# Patient Record
Sex: Male | Born: 1940
Health system: Southern US, Community
[De-identification: ages and names within clinical notes are randomized; demographics above are authoritative.]

## PROBLEM LIST (undated history)

## (undated) DIAGNOSIS — I2510 Atherosclerotic heart disease of native coronary artery without angina pectoris: Secondary | ICD-10-CM

## (undated) DIAGNOSIS — R011 Cardiac murmur, unspecified: Secondary | ICD-10-CM

## (undated) DIAGNOSIS — F32A Depression, unspecified: Secondary | ICD-10-CM

## (undated) DIAGNOSIS — F329 Major depressive disorder, single episode, unspecified: Secondary | ICD-10-CM

## (undated) DIAGNOSIS — I219 Acute myocardial infarction, unspecified: Secondary | ICD-10-CM

## (undated) DIAGNOSIS — E785 Hyperlipidemia, unspecified: Secondary | ICD-10-CM

## (undated) DIAGNOSIS — Z5189 Encounter for other specified aftercare: Secondary | ICD-10-CM

## (undated) DIAGNOSIS — H269 Unspecified cataract: Secondary | ICD-10-CM

## (undated) DIAGNOSIS — C4490 Unspecified malignant neoplasm of skin, unspecified: Secondary | ICD-10-CM

## (undated) HISTORY — DX: Cardiac murmur, unspecified: R01.1

## (undated) HISTORY — PX: OTHER SURGICAL HISTORY: SHX169

## (undated) HISTORY — DX: Encounter for other specified aftercare: Z51.89

## (undated) HISTORY — DX: Unspecified cataract: H26.9

## (undated) HISTORY — PX: HERNIA REPAIR: SHX51

## (undated) HISTORY — DX: Major depressive disorder, single episode, unspecified: F32.9

## (undated) HISTORY — DX: Hyperlipidemia, unspecified: E78.5

## (undated) HISTORY — DX: Atherosclerotic heart disease of native coronary artery without angina pectoris: I25.10

## (undated) HISTORY — DX: Depression, unspecified: F32.A

## (undated) HISTORY — PX: APPENDECTOMY: SHX54

## (undated) HISTORY — DX: Unspecified malignant neoplasm of skin, unspecified: C44.90

## (undated) HISTORY — PX: COLON SURGERY: SHX602

## (undated) HISTORY — DX: Acute myocardial infarction, unspecified: I21.9

---

## 2000-03-22 ENCOUNTER — Encounter: Payer: Self-pay | Admitting: *Deleted

## 2000-03-22 ENCOUNTER — Encounter: Admission: RE | Admit: 2000-03-22 | Discharge: 2000-03-22 | Payer: Self-pay | Admitting: *Deleted

## 2001-05-17 ENCOUNTER — Ambulatory Visit (HOSPITAL_COMMUNITY): Admission: RE | Admit: 2001-05-17 | Discharge: 2001-05-17 | Payer: Self-pay

## 2003-12-25 ENCOUNTER — Ambulatory Visit (HOSPITAL_COMMUNITY): Admission: RE | Admit: 2003-12-25 | Discharge: 2003-12-25 | Payer: Self-pay | Admitting: Gastroenterology

## 2008-06-10 LAB — HM COLONOSCOPY

## 2010-07-25 ENCOUNTER — Encounter: Admission: RE | Admit: 2010-07-25 | Discharge: 2010-07-25 | Payer: Self-pay | Admitting: Otolaryngology

## 2010-10-23 HISTORY — PX: OTHER SURGICAL HISTORY: SHX169

## 2011-03-10 NOTE — Op Note (Signed)
NAME:  Timothy Escobar, Timothy Escobar                        ACCOUNT NO.:  [id number]   MEDICAL RECORD NO.:  [id number]                   PATIENT TYPE:  AMB   LOCATION:  ENDO                                 FACILITY:  [facility]   PHYSICIAN:  John C. Madilyn Fireman, M.D.                 DATE OF BIRTH:  November 16, 1940   DATE OF PROCEDURE:  12/25/2003  DATE OF DISCHARGE:                                 OPERATIVE REPORT   INDICATIONS FOR PROCEDURE:  Anemia in a 70 year old patient with no previous  colon screening.   PROCEDURE:  The patient was placed in the left lateral decubitus position  and placed on the pulse monitor with continuous low flow oxygen delivered by  nasal cannula.  He was sedated with 50 mcg IV fentanyl, 5 mg IV Versed.  An  Olympus video colonoscope is inserted into the rectum and advanced to the  cecum.  Confirmed by transillumination at McBurney's point.  Visualization  of the ileocecal valve and appendiceal orifice.  Prep is excellent.  The  cecum, ascending, transverse, descending, sigmoid colon all appeared normal  with no masses, polyps, diverticula, or other mucosal abnormalities.  The  rectum likewise appeared normal, and retroflexed view of the anus revealed  no obvious internal hemorrhoids.  The scope was then withdrawn, and the  patient returned to the recovery room in stable condition.  He tolerated the  procedure well, and there were no immediate complications.   IMPRESSION:  Normal colonoscopy.   PLAN:  The next colon screening by sigmoidoscopy in five years.                                               John C. Madilyn Fireman, M.D.    [facility]  D:  12/25/2003  T:  12/25/2003  Job:  [id number]   cc:   Talmadge Coventry, M.D.  [address]  [place]  [place] [address]  Fax: [phone]

## 2011-03-10 NOTE — Op Note (Signed)
[facility]  Patient:    Timothy Escobar, Timothy Escobar                     MRN: [id number] Proc. Date: 05/17/01 Adm. Date:  [id number] Attending:  Meredith Leeds                           Operative Report  PREOPERATIVE DIAGNOSIS:  Indirect right inguinal hernia.  POSTOPERATIVE DIAGNOSIS:  Indirect right inguinal hernia.  OPERATION:  Repair of right inguinal hernia.  SURGEON:  Zigmund Daniel, M.D.  ANESTHESIA:  Local with sedation.  DESCRIPTION OF PROCEDURE:  After the patient was adequately sedated and had routine preparation and draping of the right inguinal region, I liberally infused local anesthetic in the operative field and added more as I deepened the dissection.  He seemed comfortable throughout the procedure.  I made a slightly oblique transverse skin incision, beginning just above and lateral to the pubic tubercle and dissected down through the subcutaneous fat until I encountered the external oblique.  I opened the external oblique in the direction of its fibers into the external ring, taking care to avoid injury to the ilioinguinal nerve.  I mobilized and controlled the spermatic cord.  I noted that the medial inguinal floor was intact.  There was bulging in the superior aspect of the internal ring.  I dissected out a sizeable cord lipoma and suture ligated with 2-0 silk.  I dissected out a sizeable inguinal hernia and reduced it through the internal ring after separating it from the cord vessels and vas deferens.  I then plugged the superior aspect of the internal ring with a plug of polypropylene mesh, held in place with a single suture of 2-0 silk placed in the internal oblique fascia.  I then fashioned a patch of polypropylene mesh with a slit cut in it to allow egress of the spermatic cord, sewed it in from the pubic tubercle laterally and superiorly in the internal oblique fascia and inferiorly and laterally with a running suture  in the inguinal ligament.  I used 2-0 Prolene for this suture.  I used a single stitch to join the tails of the mesh together lateral to the internal ring and spermatic cord.  I felt that the hernia repair was secure.  I added a little local anesthetic, then closed the external oblique with running 3-0 Vicryl, closed the subcutaneous tissues with running 3-0 Vicryl and closed the skin with interrupted intracuticular 4-0 Vicryl and Steri-Strips.  He tolerated the operation well. DD:  05/17/01 TD:  05/18/01 Job: [id number]

## 2012-08-16 ENCOUNTER — Other Ambulatory Visit: Payer: Self-pay | Admitting: Family Medicine

## 2012-08-17 NOTE — Telephone Encounter (Signed)
Please pull paper chart.  

## 2012-08-19 NOTE — Telephone Encounter (Signed)
Chart pulled to PA pool at nurses station [phone]

## 2012-08-19 NOTE — Telephone Encounter (Signed)
Refilled #12 pills, pt needs OV for more, last seen November 2012

## 2012-09-18 ENCOUNTER — Encounter: Payer: Self-pay | Admitting: Family Medicine

## 2012-09-26 ENCOUNTER — Encounter: Payer: Self-pay | Admitting: Family Medicine

## 2012-09-26 ENCOUNTER — Ambulatory Visit: Payer: Medicare Other

## 2012-09-26 ENCOUNTER — Ambulatory Visit (INDEPENDENT_AMBULATORY_CARE_PROVIDER_SITE_OTHER): Payer: Medicare Other | Admitting: Family Medicine

## 2012-09-26 VITALS — BP 106/64 | HR 68 | Temp 97.8°F | Resp 18 | Ht 69.0 in | Wt 170.0 lb

## 2012-09-26 DIAGNOSIS — Z0000 Encounter for general adult medical examination without abnormal findings: Secondary | ICD-10-CM

## 2012-09-26 DIAGNOSIS — M25559 Pain in unspecified hip: Secondary | ICD-10-CM

## 2012-09-26 DIAGNOSIS — Z13220 Encounter for screening for lipoid disorders: Secondary | ICD-10-CM

## 2012-09-26 DIAGNOSIS — Z125 Encounter for screening for malignant neoplasm of prostate: Secondary | ICD-10-CM

## 2012-09-26 DIAGNOSIS — Z23 Encounter for immunization: Secondary | ICD-10-CM

## 2012-09-26 LAB — POCT URINALYSIS DIPSTICK
Bilirubin, UA: NEGATIVE
Blood, UA: NEGATIVE
Glucose, UA: NEGATIVE
Leukocytes, UA: NEGATIVE
Nitrite, UA: NEGATIVE
Protein, UA: NEGATIVE
Spec Grav, UA: >=1.03
Urobilinogen, UA: 0.2
pH, UA: 5

## 2012-09-26 LAB — PSA, MEDICARE: PSA: 0.78 ng/mL (ref <=4.00)

## 2012-09-26 LAB — IFOBT (OCCULT BLOOD): IFOBT: NEGATIVE

## 2012-09-26 MED ORDER — SUMATRIPTAN SUCCINATE 100 MG PO TABS: ORAL_TABLET | ORAL | Status: DC

## 2012-09-26 MED ORDER — MOMETASONE FUROATE 0.1 % EX OINT: TOPICAL_OINTMENT | Freq: Every day | CUTANEOUS | Status: DC

## 2012-09-26 MED ORDER — FLUOXETINE HCL 20 MG PO CAPS: 20.0000 mg | ORAL_CAPSULE | Freq: Every day | ORAL | Status: DC

## 2012-09-26 NOTE — Progress Notes (Signed)
Subjective:    Patient ID: Timothy Escobar, male    DOB: 1941-05-17, 71 y.o.   MRN: [id number]  HPI  This 71 y.o. Cauc male is here for CPE; his chronic medical problems include Depression,   Migraine  HA disorder and Hypercholesterolemia. He is compliant with chronic medications w/o  side effects. His primary complaint today involves left hip pain, onset 20+ years ago. At that time,  he was treated by a chiropractor who diagnosed bursitis. He had a flare-up 2 weeks ago while  squatting. Pain has persisted and he can lay on left hip for long periods. The pain does not radiate  and he denies numbness or weakness in left limb. He walks daily. He also uses Aspercreme  topically, ingests ginger and tumeric as well as yellow mustard daily.    The pt is a nonsmoker and enjoys a glass of wine daily. He is married and is retired.   Last Eye exam: Aug 2013 ("floaters"- benign)  Last ECG: 2012 (normal).  Last CRS: Aug 19,2009 (One 6 mm polyp- hyperplastic- in the recto-sigmoid colon)     Review of Systems  Musculoskeletal: Positive for back pain and arthralgias. Negative for myalgias, joint swelling and gait problem.  Skin:       Facial lesion for years- gray slightly scaly; no change but pt does shave over it and it gets irritated occasionally.  Neurological: Positive for headaches.       Chronic migraines  All other systems reviewed and are negative.       Objective:   Physical Exam  Constitutional: He is oriented to person, place, and time. He appears well-developed and well-nourished. No distress.  HENT:  Head: Normocephalic and atraumatic.  Right Ear: Hearing, tympanic membrane, external ear and ear canal normal.  Left Ear: Hearing, tympanic membrane, external ear and ear canal normal.  Nose: No mucosal edema, nasal deformity or septal deviation. Right sinus exhibits no maxillary sinus tenderness and no frontal sinus tenderness. Left sinus exhibits no maxillary sinus tenderness and  no frontal sinus tenderness.  Mouth/Throat: Uvula is midline, oropharynx is clear and moist and mucous membranes are normal. No oral lesions. Normal dentition. No posterior oropharyngeal erythema.  Eyes: Conjunctivae normal, EOM and lids are normal. Pupils are equal, round, and reactive to light. No scleral icterus.  Fundoscopic exam:      The right eye shows red reflex.      The left eye shows red reflex.      Vision eval UTD- pt wears corrective lenses  Neck: Normal range of motion. Neck supple. No thyromegaly present.  Cardiovascular: Normal rate, regular rhythm, normal heart sounds and intact distal pulses.  Exam reveals no gallop and no friction rub.   No murmur heard. Pulmonary/Chest: Effort normal and breath sounds normal. No respiratory distress.  Abdominal: Soft. Normal appearance and bowel sounds are normal. He exhibits no distension, no abdominal bruit, no pulsatile midline mass and no mass. There is no hepatosplenomegaly. There is no tenderness. There is no guarding and no CVA tenderness.  Genitourinary: Rectum normal, prostate normal, testes normal and penis normal. Rectal exam shows no external hemorrhoid, no fissure, no mass, no tenderness and anal tone normal. Guaiac negative stool. Prostate is not tender. Right testis shows no mass, no swelling and no tenderness. Left testis shows no mass, no swelling and no tenderness.  Musculoskeletal: Normal range of motion. He exhibits no edema and no tenderness.       Mild degenerative changes in  hands, knees. Left hip- tender w/ palpation over lateral and posterior aspects of joint; ROM normal.   Lymphadenopathy:    He has no cervical adenopathy.       Right: No inguinal adenopathy present.       Left: No inguinal adenopathy present.  Neurological: He is alert and oriented to person, place, and time. He has normal reflexes. He displays normal reflexes. No cranial nerve deficit. Coordination normal.  Skin: Skin is warm and dry. No rash  noted. No erythema. No pallor.       Face- 1 cm scaly gray lesion (c/w seb. K) on right temporal area; no ulceration of redness noted. Similar lesions on extensor aspects of hands  Psychiatric: He has a normal mood and affect. His behavior is normal. Judgment and thought content normal.      [facility] reading (PRIMARY) by  Dr. Audria Nine: Left hip/pelvis- mild degenerative changes; joint  space well preserved. No fracture or dislocation.      Assessment & Plan:   1. Routine general medical examination at a health care facility   Vitamin D 25 hydroxy, Basic metabolic panel, Tdap vaccine greater than or equal to 7yo IM  2. Special screening for malignant neoplasm of prostate  PSA, Medicare  3. Screening for lipoid disorders  Lipid panel  4. Hip pain  DG Hip Complete Left Continue Ibuprofen 400 mg  1x daily pc Nonmedicinal remedies discussed  Ambulatory referral to Orthopedic Surgery

## 2012-09-26 NOTE — Patient Instructions (Signed)
Keeping you healthy  Get these tests  Blood pressure- Have your blood pressure checked once a year by your healthcare provider.  Normal blood pressure is 120/80  Weight- Have your body mass index (BMI) calculated to screen for obesity.  BMI is a measure of body fat based on height and weight. You can also calculate your own BMI at [email or web address].  Cholesterol- Have your cholesterol checked every year.  Diabetes- Have your blood sugar checked regularly if you have high blood pressure, high cholesterol, have a family history of diabetes or if you are overweight.  Screening for Colon Cancer- Colonoscopy starting at age 78.  Screening may begin sooner depending on your family history and other health conditions. Follow up colonoscopy as directed by your Gastroenterologist.  Screening for Prostate Cancer- Both blood work (PSA) and a rectal exam help screen for Prostate Cancer.  Screening begins at age 46 with African-American men and at age 62 with Caucasian men.  Screening may begin sooner depending on your family history.  Take these medicines  Aspirin- One aspirin daily can help prevent Heart disease and Stroke.  Flu shot- Every fall.  Tetanus- Every 10 years.  You received a Tdap today.  Zostavax- Once after the age of 28 to prevent Shingles.  Pneumonia shot- Once after the age of 65; if you are younger than 79, ask your healthcare provider if you need a Pneumonia shot.  Take these steps  Don't smoke- If you do smoke, talk to your doctor about quitting.  For tips on how to quit, go to www.smokefree.gov or call 1-800-QUIT-NOW.  Be physically active- Exercise 5 days a week for at least 30 minutes.  If you are not already physically active start slow and gradually work up to 30 minutes of moderate physical activity.  Examples of moderate activity include walking briskly, mowing the yard, dancing, swimming, bicycling, etc.  Eat a healthy diet- Eat a variety of healthy food such  as fruits, vegetables, low fat milk, low fat cheese, yogurt, lean meant, poultry, fish, beans, tofu, etc. For more information go to www.thenutritionsource.org  Drink alcohol in moderation- Limit alcohol intake to less than two drinks a day. Never drink and drive.  Dentist- Brush and floss twice daily; visit your dentist twice a year.  Depression- Your emotional health is as important as your physical health. If you're feeling down, or losing interest in things you would normally enjoy please talk to your healthcare provider.  Eye exam- Visit your eye doctor every year.  Safe sex- If you may be exposed to a sexually transmitted infection, use a condom.  Seat belts- Seat belts can save your life; always wear one.  Smoke/Carbon Monoxide detectors- These detectors need to be installed on the appropriate level of your home.  Replace batteries at least once a year.  Skin cancer- When out in the sun, cover up and use sunscreen 15 SPF or higher.  Violence- If anyone is threatening you, please tell your healthcare provider.  Living Will/ Health care power of attorney- Speak with your healthcare provider and family.      Hip Pain The hips join the upper legs to the lower pelvis. The bones, cartilage, tendons, and muscles of the hip joint perform a lot of work each day holding your body weight and allowing you to move around. Hip pain is a common symptom. It can range from a minor ache to severe pain on 1 or both hips. Pain may be felt on the inside of  the hip joint near the groin, or the outside near the buttocks and upper thigh. There may be swelling or stiffness as well. It occurs more often when a person walks or performs activity. There are many reasons hip pain can develop. CAUSES  It is important to work with your caregiver to identify the cause since many conditions can impact the bones, cartilage, muscles, and tendons of the hips. Causes for hip pain include:  Broken (fractured)  bones.  Separation of the thighbone from the hip socket (dislocation).  Torn cartilage of the hip joint.  Swelling (inflammation) of a tendon (tendonitis), the sac within the hip joint (bursitis), or a joint.  A weakening in the abdominal wall (hernia), affecting the nerves to the hip.  Arthritis in the hip joint or lining of the hip joint.  Pinched nerves in the back, hip, or upper thigh.  A bulging disc in the spine (herniated disc).  Rarely, bone infection or cancer. DIAGNOSIS  The location of your hip pain will help your caregiver understand what may be causing the pain. A diagnosis is based on your medical history, your symptoms, results from your physical exam, and results from diagnostic tests. Diagnostic tests may include X-ray exams, a computerized magnetic scan (magnetic resonance imaging, MRI), or bone scan. TREATMENT  Treatment will depend on the cause of your hip pain. Treatment may include:  Limiting activities and resting until symptoms improve.  Crutches or other walking supports (a cane or brace).  Ice, elevation, and compression.  Physical therapy or home exercises.  Shoe inserts or special shoes.  Losing weight.  Medications to reduce pain.  Undergoing surgery. HOME CARE INSTRUCTIONS   Only take over-the-counter or prescription medicines for pain, discomfort, or fever as directed by your caregiver.  Put ice on the injured area:  Put ice in a plastic bag.  Place a towel between your skin and the bag.  Leave the ice on for 15 to 20 minutes at a time, 3 to 4 times a day.  Keep your leg raised (elevated) when possible to lessen swelling.  Avoid activities that cause pain.  Follow specific exercises as directed by your caregiver.  Sleep with a pillow between your legs on your most comfortable side.  Record how often you have hip pain, the location of the pain, and what it feels like. This information may be helpful to you and your  caregiver.  Ask your caregiver about returning to work or sports and whether you should drive.  Follow up with your caregiver for further exams, therapy, or testing as directed. SEEK MEDICAL CARE IF:   Your pain or swelling continues or worsens after 1 week.  You are feeling unwell or have chills.  You have increasing difficulty with walking.  You have a loss of sensation or other new symptoms.  You have questions or concerns. SEEK IMMEDIATE MEDICAL CARE IF:   You cannot put weight on the affected hip.  You have fallen.  You have a sudden increase in pain and swelling in your hip.  You have a fever. MAKE SURE YOU:   Understand these instructions.  Will watch your condition.  Will get help right away if you are not doing well or get worse. Document Released: 03/29/2010 Document Revised: 01/01/2012 Document Reviewed: 03/29/2010 [facility] Patient Information 2013 [place], [place].  You can take Ibuprofen 200 mg  2 tabs once daily with food if that provides relief. COntinue with other remedies for now until you see then [facility]  specialist.

## 2012-09-26 NOTE — Progress Notes (Signed)
Quick Note:  Your recent imaging study is normal. Pt seen 09/26/12 for CPE and was told that xray may show some mild arthritic changes. ______

## 2012-09-27 ENCOUNTER — Encounter: Payer: Self-pay | Admitting: *Deleted

## 2012-09-27 LAB — BASIC METABOLIC PANEL
BUN: 16 mg/dL (ref 6–23)
CO2: 31 mEq/L (ref 19–32)
Chloride: 106 mEq/L (ref 96–112)
Creat: 0.83 mg/dL (ref 0.50–1.35)
Glucose, Bld: 91 mg/dL (ref 70–99)

## 2012-09-27 LAB — LIPID PANEL
HDL: 63 mg/dL (ref >39)
Total CHOL/HDL Ratio: 2.7 Ratio
Triglycerides: 62 mg/dL (ref <150)

## 2012-09-27 NOTE — Progress Notes (Deleted)
  Subjective:    Patient ID: Timothy Escobar, male    DOB: 07/30/1941, 71 y.o.   MRN: [id number]  HPI    Review of Systems  Constitutional: Negative.   HENT: Negative.   Eyes: Negative.   Respiratory: Negative.   Cardiovascular: Negative.   Gastrointestinal: Negative.        Objective:   Physical Exam        Assessment & Plan:

## 2012-09-27 NOTE — Progress Notes (Signed)
Quick Note:  Please notify pt that results are normal.   Provide pt with copy of labs. ______ 

## 2012-09-28 DIAGNOSIS — G43909 Migraine, unspecified, not intractable, without status migrainosus: Secondary | ICD-10-CM | POA: Insufficient documentation

## 2012-09-28 DIAGNOSIS — M25559 Pain in unspecified hip: Secondary | ICD-10-CM | POA: Insufficient documentation

## 2012-09-28 DIAGNOSIS — F329 Major depressive disorder, single episode, unspecified: Secondary | ICD-10-CM | POA: Insufficient documentation

## 2013-05-23 ENCOUNTER — Other Ambulatory Visit (INDEPENDENT_AMBULATORY_CARE_PROVIDER_SITE_OTHER): Payer: Self-pay | Admitting: Otolaryngology

## 2013-06-23 ENCOUNTER — Other Ambulatory Visit: Payer: Self-pay

## 2013-06-23 MED ORDER — SUMATRIPTAN SUCCINATE 100 MG PO TABS: ORAL_TABLET | ORAL | Status: DC

## 2013-07-18 ENCOUNTER — Ambulatory Visit (INDEPENDENT_AMBULATORY_CARE_PROVIDER_SITE_OTHER): Payer: Medicare Other | Admitting: Family Medicine

## 2013-07-18 DIAGNOSIS — Z23 Encounter for immunization: Secondary | ICD-10-CM

## 2013-08-24 ENCOUNTER — Ambulatory Visit (INDEPENDENT_AMBULATORY_CARE_PROVIDER_SITE_OTHER): Payer: Medicare Other | Admitting: Family Medicine

## 2013-08-24 VITALS — BP 128/80 | HR 65 | Temp 98.0°F | Resp 17 | Ht 68.0 in | Wt 173.0 lb

## 2013-08-24 DIAGNOSIS — G43901 Migraine, unspecified, not intractable, with status migrainosus: Secondary | ICD-10-CM

## 2013-08-24 NOTE — Progress Notes (Signed)
This chart was scribed for Timothy Sidle, MD by Timothy Escobar, Medical Scribe. This patient was seen in Room/bed 10 and the patient's care was started at 12:06 PM.  Subjective:    Patient ID: Timothy Escobar, male    DOB: 1941/07/26, 72 y.o.   MRN: [id number]  HPI HPI Comments: Timothy Escobar is a 72 y.o. male who presents to [facility] requesting medication refill of Imitrex. Pt reports getting left sided headaches a few times per week for the past several years. He ran out of medication yesterday and the migraine returned. He had trouble sleeping last night due to the pain. He denies photophobia or vomiting. 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   Review of Systems  Eyes: Negative for photophobia.  Gastrointestinal: Negative for vomiting.  Neurological: Positive for headaches.   Past Surgical History  Procedure Laterality Date  . Appendectomy    . Hernia repair    . Vocal fold polyp removed     History   Social History  . Marital Status: Married    Spouse Name: N/A    Number of Children: N/A  . Years of Education: N/A   Occupational History  . Not on file.   Social History Main Topics  . Smoking status: Never Smoker   . Smokeless tobacco: Never Used  . Alcohol Use: 3.5 oz/week    7 drink(s) per week     Comment: wine: 1 glass daily  . Drug Use: No  . Sexual Activity: Not on file   Other Topics Concern  . Not on file   Social History Narrative   Exercise walk/gardening daily for 1 hr plus   Past Medical History  Diagnosis Date  . Cancer     skin cancer  . Depression    Family History  Problem Relation Age of Onset  . Dementia Mother   . Cancer Father     skin  . Heart disease Maternal Grandmother   . Stroke Maternal Grandmother   . Cancer Sister     breast (pt did not specific L or R)  . Cancer Brother     bladder and pancreatic   No Known Allergies       Objective:   Physical Exam  Nursing note and vitals reviewed. Constitutional: He is oriented to person, place, and time. He appears well-developed and well-nourished. No distress.  HENT:  Head: Atraumatic.  Eyes: Conjunctivae and EOM are normal. Pupils are equal, round, and reactive to light.  Neck: Normal range of motion. Neck supple.  Pulmonary/Chest: No respiratory distress.  Neurological: He is alert and oriented to person, place, and time.  Skin: He is not diaphoretic.  Psychiatric: He has a normal mood and affect.      Assessment & Plan:  Migraine with status migrainosus  Signed, Timothy Sidle, MD

## 2013-10-02 ENCOUNTER — Ambulatory Visit (INDEPENDENT_AMBULATORY_CARE_PROVIDER_SITE_OTHER): Payer: Medicare Other | Admitting: Family Medicine

## 2013-10-02 ENCOUNTER — Encounter: Payer: Self-pay | Admitting: Family Medicine

## 2013-10-02 VITALS — BP 127/82 | HR 51 | Temp 98.1°F | Resp 16 | Ht 68.0 in | Wt 172.0 lb

## 2013-10-02 DIAGNOSIS — Z760 Encounter for issue of repeat prescription: Secondary | ICD-10-CM

## 2013-10-02 DIAGNOSIS — Z139 Encounter for screening, unspecified: Secondary | ICD-10-CM

## 2013-10-02 DIAGNOSIS — F329 Major depressive disorder, single episode, unspecified: Secondary | ICD-10-CM

## 2013-10-02 DIAGNOSIS — Z0000 Encounter for general adult medical examination without abnormal findings: Secondary | ICD-10-CM

## 2013-10-02 LAB — POCT URINALYSIS DIPSTICK
Bilirubin, UA: NEGATIVE
Blood, UA: NEGATIVE
Ketones, UA: NEGATIVE
Protein, UA: NEGATIVE
pH, UA: 8.5

## 2013-10-02 MED ORDER — FLUOXETINE HCL 20 MG PO CAPS: 20.0000 mg | ORAL_CAPSULE | Freq: Every day | ORAL | Status: DC

## 2013-10-02 MED ORDER — MOMETASONE FUROATE 0.1 % EX OINT: TOPICAL_OINTMENT | Freq: Every day | CUTANEOUS | Status: DC

## 2013-10-02 MED ORDER — SUMATRIPTAN SUCCINATE 100 MG PO TABS: ORAL_TABLET | ORAL | Status: DC

## 2013-10-02 NOTE — Patient Instructions (Signed)

## 2013-10-02 NOTE — Progress Notes (Signed)
Subjective:    Patient ID: Timothy Escobar, male    DOB: 13-Oct-1941, 72 y.o.   MRN: [id number]  HPI  This 72 y.o. Cauc male is here for annual CPE [facility] Subsequent). He enjoys good health aside from chronic migraine HA disorder, which is well controlled on Sumatriptan and depression. He reports no medication adverse effects and maintains a healthy diet; his wife is a vegetarian. Pt is retired, consumes alcohol regularly and stays physically active.  HCM: IMM- Current.           CRS: Aug 2009- 1 polyp.           Vision- Current; due within next 6 months.            ECG: 2013 pre-op eval.- normal.            PSA: Normal 2013; pt asymptomatic.   Patient Active Problem List   Diagnosis Date Noted  . Hip pain 09/28/2012  . Chronic depression 09/28/2012  . Migraine headache 09/28/2012   PMHx, Surg Hx, Soc and Fam Hx reviewed.  Medications reconciled.   Review of Systems  Constitutional: Negative.   Eyes: Negative.   Respiratory: Negative.   Cardiovascular: Negative.   Gastrointestinal: Negative.   Endocrine: Negative.   Genitourinary: Negative.   Musculoskeletal: Negative.   Skin: Negative.   Allergic/Immunologic: Negative.   Neurological: Positive for headaches.  Hematological: Negative.   Psychiatric/Behavioral: Negative.        HANDS Questionnaire for Depression Screen: score= 0      Objective:   Physical Exam  Nursing note and vitals reviewed. Constitutional: He is oriented to person, place, and time. Vital signs are normal. He appears well-developed and well-nourished. No distress.  HENT:  Head: Normocephalic and atraumatic.  Right Ear: Hearing, tympanic membrane, external ear and ear canal normal.  Left Ear: Hearing, tympanic membrane, external ear and ear canal normal.  Nose: Nose normal. No mucosal edema, sinus tenderness, nasal deformity or septal deviation.  Mouth/Throat: Uvula is midline, oropharynx is clear and moist and mucous membranes are normal. No oral  lesions. Normal dentition.  Eyes: Conjunctivae, EOM and lids are normal. Pupils are equal, round, and reactive to light. No scleral icterus.  Fundoscopic exam:      The right eye shows no papilledema. The right eye shows red reflex.       The left eye shows no papilledema. The left eye shows red reflex.  Wears corrective lenses.  Neck: Normal range of motion and full passive range of motion without pain. Neck supple. No JVD present. No spinous process tenderness and no muscular tenderness present. Carotid bruit is not present. No mass and no thyromegaly present.  Cardiovascular: Normal rate, regular rhythm, S1 normal, S2 normal, normal heart sounds and normal pulses.   No extrasystoles are present. PMI is not displaced.  Exam reveals no gallop and no friction rub.   No murmur heard. Pulmonary/Chest: Effort normal and breath sounds normal. No respiratory distress. He exhibits no mass, no bony tenderness and no deformity.  Abdominal: Soft. Normal appearance and bowel sounds are normal. He exhibits no distension, no abdominal bruit, no pulsatile midline mass and no mass. There is no hepatosplenomegaly. There is no tenderness. There is no guarding and no CVA tenderness. No hernia.  Genitourinary: Rectum normal and prostate normal. Rectal exam shows no external hemorrhoid, no fissure, no mass, no tenderness and anal tone normal. Guaiac negative stool. Prostate is not enlarged and not tender.  Musculoskeletal: Normal range  of motion. He exhibits no edema and no tenderness.       Cervical back: Normal.       Thoracic back: Normal.       Lumbar back: Normal.  Lymphadenopathy:       Head (right side): No submental, no submandibular, no tonsillar, no posterior auricular and no occipital adenopathy present.       Head (left side): No submental, no submandibular, no tonsillar, no posterior auricular and no occipital adenopathy present.    He has no cervical adenopathy.    He has no axillary adenopathy.        Right: No inguinal and no supraclavicular adenopathy present.       Left: No inguinal and no supraclavicular adenopathy present.  Neurological: He is alert and oriented to person, place, and time. He has normal strength and normal reflexes. He displays no atrophy and no tremor. No cranial nerve deficit or sensory deficit. He exhibits normal muscle tone. He displays a negative Romberg sign. Coordination and gait normal.  Skin: Skin is warm, dry and intact. No ecchymosis and no rash noted. He is not diaphoretic. No cyanosis or erythema. No pallor. Nails show no clubbing.  Psychiatric: He has a normal mood and affect. His speech is normal and behavior is normal. Judgment and thought content normal. Cognition and memory are normal.    Results for orders placed in visit on 10/02/13  POCT URINALYSIS DIPSTICK      Result Value Range   Color, UA yellow     Clarity, UA clear     Glucose, UA neg     Bilirubin, UA neg     Ketones, UA neg     Spec Grav, UA 1.015     Blood, UA neg     pH, UA 8.5     Protein, UA neg     Urobilinogen, UA 0.2     Nitrite, UA neg     Leukocytes, UA Negative    IFOBT (OCCULT BLOOD)      Result Value Range   IFOBT Negative        Assessment & Plan:  Routine general medical examination at a health care facility - Plan: POCT urinalysis dipstick, IFOBT POC (occult bld, rslt in office)  Chronic depression  Issue of repeat prescriptions  Meds ordered this encounter  Medications  . FLUoxetine (PROZAC) 20 MG capsule    Sig: Take 1 capsule (20 mg total) by mouth daily.    Dispense:  90 capsule    Refill:  3  . SUMAtriptan (IMITREX) 100 MG tablet    Sig: TAKE AS DIRECTED.    Dispense:  12 tablet    Refill:  3  . mometasone (ELOCON) 0.1 % ointment    Sig: Apply topically daily.    Dispense:  45 g    Refill:  3

## 2014-07-29 ENCOUNTER — Ambulatory Visit (INDEPENDENT_AMBULATORY_CARE_PROVIDER_SITE_OTHER): Payer: Medicare Other | Admitting: *Deleted

## 2014-07-29 DIAGNOSIS — Z23 Encounter for immunization: Secondary | ICD-10-CM

## 2014-10-07 ENCOUNTER — Encounter: Payer: Self-pay | Admitting: Family Medicine

## 2014-10-07 ENCOUNTER — Ambulatory Visit (INDEPENDENT_AMBULATORY_CARE_PROVIDER_SITE_OTHER): Payer: Medicare Other | Admitting: Family Medicine

## 2014-10-07 VITALS — BP 136/84 | HR 58 | Temp 97.9°F | Resp 16 | Ht 68.0 in | Wt 171.4 lb

## 2014-10-07 DIAGNOSIS — Z13220 Encounter for screening for lipoid disorders: Secondary | ICD-10-CM

## 2014-10-07 DIAGNOSIS — Z1211 Encounter for screening for malignant neoplasm of colon: Secondary | ICD-10-CM

## 2014-10-07 DIAGNOSIS — F329 Major depressive disorder, single episode, unspecified: Secondary | ICD-10-CM

## 2014-10-07 DIAGNOSIS — F32A Depression, unspecified: Secondary | ICD-10-CM

## 2014-10-07 DIAGNOSIS — Z130 Encounter for screening for diseases of the blood and blood-forming organs and certain disorders involving the immune mechanism: Secondary | ICD-10-CM

## 2014-10-07 DIAGNOSIS — G43909 Migraine, unspecified, not intractable, without status migrainosus: Secondary | ICD-10-CM

## 2014-10-07 DIAGNOSIS — Z125 Encounter for screening for malignant neoplasm of prostate: Secondary | ICD-10-CM

## 2014-10-07 DIAGNOSIS — Z0000 Encounter for general adult medical examination without abnormal findings: Secondary | ICD-10-CM

## 2014-10-07 LAB — COMPLETE METABOLIC PANEL WITH GFR
ALK PHOS: 65 U/L (ref 39–117)
ALT: 22 U/L (ref 0–53)
AST: 26 U/L (ref 0–37)
Albumin: 4.2 g/dL (ref 3.5–5.2)
BUN: 13 mg/dL (ref 6–23)
CALCIUM: 8.8 mg/dL (ref 8.4–10.5)
CO2: 30 mEq/L (ref 19–32)
CREATININE: 0.84 mg/dL (ref 0.50–1.35)
Chloride: 105 mEq/L (ref 96–112)
GFR, Est African American: >89 mL/min
GFR, Est Non African American: 88 mL/min
Glucose, Bld: 93 mg/dL (ref 70–99)
Potassium: 4.8 mEq/L (ref 3.5–5.3)
Sodium: 140 mEq/L (ref 135–145)
Total Bilirubin: 0.6 mg/dL (ref 0.2–1.2)
Total Protein: 6.4 g/dL (ref 6.0–8.3)

## 2014-10-07 LAB — CBC WITH DIFFERENTIAL/PLATELET
Basophils Absolute: 0 10*3/uL (ref 0.0–0.1)
Basophils Relative: 1 % (ref 0–1)
EOS ABS: 0.1 10*3/uL (ref 0.0–0.7)
Eosinophils Relative: 3 % (ref 0–5)
HCT: 42.8 % (ref 39.0–52.0)
Hemoglobin: 14.6 g/dL (ref 13.0–17.0)
LYMPHS PCT: 25 % (ref 12–46)
Lymphs Abs: 0.9 10*3/uL (ref 0.7–4.0)
MCH: 30.9 pg (ref 26.0–34.0)
MCHC: 34.1 g/dL (ref 30.0–36.0)
MCV: 90.5 fL (ref 78.0–100.0)
MPV: 10.3 fL (ref 9.4–12.4)
Monocytes Absolute: 0.3 10*3/uL (ref 0.1–1.0)
Monocytes Relative: 9 % (ref 3–12)
NEUTROS PCT: 62 % (ref 43–77)
Neutro Abs: 2.2 10*3/uL (ref 1.7–7.7)
Platelets: 197 10*3/uL (ref 150–400)
RBC: 4.73 MIL/uL (ref 4.22–5.81)
RDW: 13.3 % (ref 11.5–15.5)
WBC: 3.6 10*3/uL — ABNORMAL LOW (ref 4.0–10.5)

## 2014-10-07 LAB — LIPID PANEL
Cholesterol: 202 mg/dL — ABNORMAL HIGH (ref 0–200)
HDL: 63 mg/dL (ref >39)
LDL CALC: 124 mg/dL — AB (ref 0–99)
Total CHOL/HDL Ratio: 3.2 Ratio
Triglycerides: 76 mg/dL (ref <150)
VLDL: 15 mg/dL (ref 0–40)

## 2014-10-07 LAB — POCT URINALYSIS DIPSTICK
Bilirubin, UA: NEGATIVE
Blood, UA: NEGATIVE
Glucose, UA: NEGATIVE
KETONES UA: NEGATIVE
Leukocytes, UA: NEGATIVE
NITRITE UA: NEGATIVE
Protein, UA: NEGATIVE
Spec Grav, UA: 1.015
Urobilinogen, UA: 0.2
pH, UA: 7

## 2014-10-07 LAB — THYROID PANEL WITH TSH
Free Thyroxine Index: 1.2 — ABNORMAL LOW (ref 1.4–3.8)
T3 UPTAKE: 25 % (ref 22–35)
T4, Total: 4.6 ug/dL (ref 4.5–12.0)
TSH: 0.805 u[IU]/mL (ref 0.350–4.500)

## 2014-10-07 MED ORDER — FLUOXETINE HCL 20 MG PO CAPS: 20.0000 mg | ORAL_CAPSULE | Freq: Every day | ORAL | Status: DC

## 2014-10-07 MED ORDER — SUMATRIPTAN SUCCINATE 100 MG PO TABS: ORAL_TABLET | ORAL | Status: DC

## 2014-10-07 NOTE — Patient Instructions (Addendum)
Keeping you healthy  Get these tests  Blood pressure- Have your blood pressure checked once a year by your healthcare provider.  Normal blood pressure is 120/80  Weight- Have your body mass index (BMI) calculated to screen for obesity.  BMI is a measure of body fat based on height and weight. You can also calculate your own BMI at ViewBanking[employer]si.  Cholesterol- Have your cholesterol checked every year.  Diabetes- Have your blood sugar checked regularly if you have high blood pressure, high cholesterol, have a family history of diabetes or if you are overweight.  Screening for Colon Cancer- Colonoscopy starting at age 8.  Screening may begin sooner depending on your family history and other health conditions. Follow up colonoscopy as directed by your Gastroenterologist. It looks like you had this procedure in 2005; it looks like Dr. Amedeo Plenty was the GI specialist. You may need to contact his office to find out if you should schedule your screening procedure.  Dr. Amedeo Plenty' office: phone # [phone].  Screening for Prostate Cancer- Both blood work (PSA) and a rectal exam help screen for Prostate Cancer.  Screening begins at age 69 with African-American men and at age 60 with Caucasian men.  Screening may begin sooner depending on your family history.  Take these medicines  Aspirin- One aspirin daily can help prevent Heart disease and Stroke.  Flu shot- Every fall.  Tetanus- Every 10 years. Tdap given in 2013; next Tetanus due in 2023.  Zostavax- Once after the age of 57 to prevent Shingles. Vaccine complete in 2011.  Pneumonia shot- Once after the age of 7; if you are younger than 54, ask your healthcare provider if you need a Pneumonia shot. You had this vaccine when you turned 67.  Take these steps  Don't smoke- If you do smoke, talk to your doctor about quitting.  For tips on how to quit, go to www.smokefree.gov or call 1-800-QUIT-NOW.  Be physically active- Exercise 5 days a  week for at least 30 minutes.  If you are not already physically active start slow and gradually work up to 30 minutes of moderate physical activity.  Examples of moderate activity include walking briskly, mowing the yard, dancing, swimming, bicycling, etc.  Eat a healthy diet- Eat a variety of healthy food such as fruits, vegetables, low fat milk, low fat cheese, yogurt, lean meant, poultry, fish, beans, tofu, etc. For more information go to www.thenutritionsource.org  Drink alcohol in moderation- Limit alcohol intake to less than two drinks a day. Never drink and drive.  Dentist- Brush and floss twice daily; visit your dentist twice a year.  Depression- Your emotional health is as important as your physical health. If you're feeling down, or losing interest in things you would normally enjoy please talk to your healthcare provider.  Eye exam- Visit your eye doctor every year.  Safe sex- If you may be exposed to a sexually transmitted infection, use a condom.  Seat belts- Seat belts can save your life; always wear one.  Smoke/Carbon Monoxide detectors- These detectors need to be installed on the appropriate level of your home.  Replace batteries at least once a year.  Skin cancer- When out in the sun, cover up and use sunscreen 15 SPF or higher.  Violence- If anyone is threatening you, please tell your healthcare provider.  Living Will/ Health care power of attorney- Speak with your healthcare provider and family.

## 2014-10-07 NOTE — Progress Notes (Signed)
Subjective:    Patient ID: Timothy Escobar, male    DOB: September 05, 1941, 73 y.o.   MRN: [id number]  HPI  This healthy 73 y.o. 73 male is here for [facility] Subsequent annual CPE; he takes medication for chronic depression and migraines, which occur ~ once a week. Sumatriptan effectively relieves R temporal HA. Pt is retired and stays active w/ various activities (gardening, reading, singing-baritone, projects around the house).  HCM: CRS- 2009 (5-yr recall >> overdue).           IMM- Current; pt declined Prevnar13.           Vision- Current; corrective lenses.           Dental- Periodic.   Patient Active Problem List   Diagnosis Date Noted  . Hip pain 09/28/2012  . Chronic depression 09/28/2012  . Migraine headache 09/28/2012    Prior to Admission medications   Medication Sig Start Date End Date Taking? Authorizing Provider  calcium-vitamin D (OSCAL WITH D) 500-200 MG-UNIT per tablet Take 1 tablet by mouth daily. 1,000 of Vit D3   Yes Historical Provider, MD  FLUoxetine (PROZAC) 20 MG capsule Take 1 capsule (20 mg total) by mouth daily. 10/02/13  Yes Barton Fanny, MD  mometasone (ELOCON) 0.1 % ointment Apply topically daily. 10/02/13  Yes Barton Fanny, MD  Multiple Vitamins-Minerals (MULTIVITAMIN WITH MINERALS) tablet Take 1 tablet by mouth daily. Centrum Silver   Yes Historical Provider, MD  SUMAtriptan (IMITREX) 100 MG tablet TAKE AS DIRECTED. 10/02/13  Yes Barton Fanny, MD  vitamin C (ASCORBIC ACID) 500 MG tablet Take 500 mg by mouth daily.   Yes Historical Provider, MD  VITAMIN D, ERGOCALCIFEROL, PO Take 1,000 Units by mouth.   Yes Historical Provider, MD    Review of Systems  Constitutional: Negative.   Eyes: Negative.   Respiratory: Negative.   Cardiovascular: Negative.   Gastrointestinal: Negative.   Endocrine: Negative.   Genitourinary: Negative.   Musculoskeletal: Negative.   Skin: Negative.   Allergic/Immunologic: Negative.   Neurological: Positive  for headaches.  Hematological: Negative.   Psychiatric/Behavioral: Negative.       Objective:   Physical Exam  Constitutional: He is oriented to person, place, and time. Vital signs are normal. He appears well-developed and well-nourished. No distress.  HENT:  Head: Normocephalic and atraumatic.  Right Ear: Hearing, tympanic membrane, external ear and ear canal normal.  Left Ear: Hearing, tympanic membrane, external ear and ear canal normal.  Nose: Nose normal. No nasal deformity or septal deviation.  Mouth/Throat: Uvula is midline, oropharynx is clear and moist and mucous membranes are normal. No oral lesions. Normal dentition.  Eyes: Conjunctivae, EOM and lids are normal. Pupils are equal, round, and reactive to light. No scleral icterus.  Neck: Trachea normal, normal range of motion, full passive range of motion without pain and phonation normal. Neck supple. No JVD present. No spinous process tenderness and no muscular tenderness present. Carotid bruit is not present. No thyroid mass and no thyromegaly present.  R lateral - well healed post-surgical scar.  Cardiovascular: Normal rate, regular rhythm, S1 normal, S2 normal, normal heart sounds, intact distal pulses and normal pulses.   No extrasystoles are present. PMI is not displaced.  Exam reveals no gallop and no friction rub.   No murmur heard. Pulmonary/Chest: Effort normal and breath sounds normal. No respiratory distress. He has no decreased breath sounds. He has no wheezes. He has no rhonchi. He has no rales.  Abdominal:  Soft. Normal appearance and bowel sounds are normal. He exhibits no distension, no abdominal bruit, no pulsatile midline mass and no mass. There is no hepatosplenomegaly. There is no tenderness. There is no guarding and no CVA tenderness.  Genitourinary:  Deferred.  Musculoskeletal:       Cervical back: Normal.       Thoracic back: Normal.       Lumbar back: Normal.  Remainder of exam unremarkable.    Lymphadenopathy:       Head (right side): No submental, no submandibular, no tonsillar, no preauricular, no posterior auricular and no occipital adenopathy present.       Head (left side): No submental, no submandibular, no tonsillar, no preauricular, no posterior auricular and no occipital adenopathy present.    He has no cervical adenopathy.    He has no axillary adenopathy.       Right: No inguinal and no supraclavicular adenopathy present.       Left: No inguinal and no supraclavicular adenopathy present.  Neurological: He is alert and oriented to person, place, and time. He has normal strength and normal reflexes. He displays no atrophy and no tremor. No cranial nerve deficit or sensory deficit. He displays a negative Romberg sign. Coordination and gait normal.  Get Up and Go test: time= 8-9 sec.  Skin: Skin is warm, dry and intact. No ecchymosis, no lesion and no rash noted. He is not diaphoretic. No cyanosis or erythema. No pallor. Nails show no clubbing.  Psychiatric: He has a normal mood and affect. His speech is normal and behavior is normal. Judgment and thought content normal. Cognition and memory are normal.  Nursing note and vitals reviewed.      Assessment & Plan:  Medicare annual wellness visit, subsequent - Plan: COMPLETE METABOLIC PANEL WITH GFR, POCT urinalysis dipstick  Chronic depression - Stable on fluoxetine 20 mg 1 cap daily. Plan: Thyroid Panel With TSH  Migraine without status migrainosus, not intractable, unspecified migraine type- Stable on Sumatriptan prn.  Screening for lipid disorders - Plan: Lipid panel  Screening for deficiency anemia - Plan: CBC with Differential, COMPLETE METABOLIC PANEL WITH GFR  Screening for prostate cancer - Plan: PSA, Medicare  Screening for colon cancer - Plan: IFOBT POC (occult bld, rslt in office)   Meds ordered this encounter  Medications  . FLUoxetine (PROZAC) 20 MG capsule    Sig: Take 1 capsule (20 mg total) by mouth  daily.    Dispense:  90 capsule    Refill:  3  . SUMAtriptan (IMITREX) 100 MG tablet    Sig: TAKE AS DIRECTED.    Dispense:  12 tablet    Refill:  3

## 2014-10-08 ENCOUNTER — Encounter: Payer: Self-pay | Admitting: Family Medicine

## 2014-10-08 LAB — PSA, MEDICARE: PSA: 1.06 ng/mL

## 2014-10-12 LAB — IFOBT (OCCULT BLOOD): IMMUNOLOGICAL FECAL OCCULT BLOOD TEST: NEGATIVE

## 2014-10-12 NOTE — Progress Notes (Signed)
Quick Note:  Please advise pt regarding following labs... All your results look good. LDL ("bad") cholesterol is a little above normal but HDL ("good)" cholesterol is high so that is very good. Focus on good nutrition and regular exercise. Blood counts, thyroid function, prostate blood test, salts/blood sugar, kidney and liver function results are normal.  Copy to pt. ______

## 2014-10-12 NOTE — Addendum Note (Signed)
Addended by: Wyatt Haste on: 10/12/2014 07:47 AM   Modules accepted: Orders

## 2014-10-13 ENCOUNTER — Encounter: Payer: Self-pay | Admitting: Radiology

## 2014-10-19 ENCOUNTER — Encounter: Payer: Self-pay | Admitting: Internal Medicine

## 2014-11-03 ENCOUNTER — Ambulatory Visit (INDEPENDENT_AMBULATORY_CARE_PROVIDER_SITE_OTHER): Payer: Medicare Other | Admitting: Emergency Medicine

## 2014-11-03 VITALS — BP 132/78 | HR 66 | Temp 98.0°F | Resp 16 | Ht 68.0 in | Wt 173.0 lb

## 2014-11-03 DIAGNOSIS — J209 Acute bronchitis, unspecified: Secondary | ICD-10-CM

## 2014-11-03 MED ORDER — HYDROCOD POLST-CHLORPHEN POLST 10-8 MG/5ML PO LQCR: 5.0000 mL | Freq: Two times a day (BID) | ORAL | Status: DC | PRN

## 2014-11-03 MED ORDER — AZITHROMYCIN 250 MG PO TABS: ORAL_TABLET | ORAL | Status: DC

## 2014-11-03 NOTE — Progress Notes (Signed)
[facility] [address], [place] [address] [phone]  Date:  11/03/2014   Name:  Timothy Escobar   DOB:  [birth year]   MRN:  [id number]  PCP:  No PCP Per Patient    Chief Complaint: URI   History of Present Illness:  Timothy Escobar is a 74 y.o. very pleasant male patient who presents with the following:  Ill with nasal congestion and post nasal drainage that is purulent. Has mucopurulent nasal drainage. No sore throat Cough productive with purulent sputum No fever or chills.  No wheezing or shortness of breath No improvement with over the counter medications or other home remedies.  Denies other complaint or health concern today.   Patient Active Problem List   Diagnosis Date Noted  . Hip pain 09/28/2012  . Chronic depression 09/28/2012  . Migraine headache 09/28/2012    Past Medical History  Diagnosis Date  . Cancer     skin cancer  . Depression     Past Surgical History  Procedure Laterality Date  . Appendectomy    . Hernia repair    . Vocal fold polyp removed      History  Substance Use Topics  . Smoking status: Never Smoker   . Smokeless tobacco: Never Used  . Alcohol Use: 3.5 oz/week    7 Not specified per week     Comment: wine: 1 glass daily    Family History  Problem Relation Age of Onset  . Dementia Mother   . Cancer Father     skin  . Heart disease Maternal Grandmother   . Stroke Maternal Grandmother   . Cancer Sister     breast (pt did not specific L or R)  . Cancer Brother     bladder and pancreatic  . Heart disease Brother   . Cancer Brother     No Known Allergies  Medication list has been reviewed and updated.  Current Outpatient Prescriptions on File Prior to Visit  Medication Sig Dispense Refill  . calcium-vitamin D (OSCAL WITH D) 500-200 MG-UNIT per tablet Take 1 tablet by mouth daily. 1,000 of Vit D3    . FLUoxetine (PROZAC) 20 MG capsule Take 1 capsule (20 mg total) by mouth daily. 90  capsule 3  . mometasone (ELOCON) 0.1 % ointment Apply topically daily. 45 g 3  . Multiple Vitamins-Minerals (MULTIVITAMIN WITH MINERALS) tablet Take 1 tablet by mouth daily. Centrum Silver    . SUMAtriptan (IMITREX) 100 MG tablet TAKE AS DIRECTED. 12 tablet 3  . vitamin C (ASCORBIC ACID) 500 MG tablet Take 500 mg by mouth daily.    Marland Kitchen VITAMIN D, ERGOCALCIFEROL, PO Take 1,000 Units by mouth.     No current facility-administered medications on file prior to visit.    Review of Systems:   As per HPI, otherwise negative.    Physical Examination: Filed Vitals:   11/03/14 0837  BP: 132/78  Pulse: 66  Temp: 98 F (36.7 C)  Resp: 16   Filed Vitals:   11/03/14 0837  Height: 5\' 8"  (1.727 m)  Weight: 173 lb (78.472 kg)   Body mass index is 26.31 kg/(m^2). Ideal Body Weight: Weight in (lb) to have BMI = 25: 164.1  GEN: WDWN, NAD, Non-toxic, A & O x 3 HEENT: Atraumatic, Normocephalic. Neck supple. No masses, No LAD. Ears and Nose: No external deformity. CV: RRR, No M/G/R. No JVD. No thrill. No extra heart sounds. PULM: CTA B, no wheezes, crackles,  rhonchi. No retractions. No resp. distress. No accessory muscle use. ABD: S, NT, ND, +BS. No rebound. No HSM. EXTR: No c/c/e NEURO Normal gait.  PSYCH: Normally interactive. Conversant. Not depressed or anxious appearing.  Calm demeanor.    Assessment and Plan: Bronchitis zpak tussionex mucinex d  Signed,  Ellison Carwin, MD

## 2014-11-03 NOTE — Patient Instructions (Signed)

## 2014-12-22 DIAGNOSIS — J330 Polyp of nasal cavity: Secondary | ICD-10-CM | POA: Diagnosis not present

## 2015-02-08 ENCOUNTER — Encounter: Payer: Self-pay | Admitting: *Deleted

## 2015-02-15 ENCOUNTER — Telehealth: Payer: Self-pay | Admitting: *Deleted

## 2015-02-15 NOTE — Telephone Encounter (Signed)
Patient phoned Friday, April 22nd to clarify PCP and [facility] him back.  Phoned, got answering machine & left name, title, and location & number.

## 2015-02-15 NOTE — Telephone Encounter (Signed)
Pt returned call from earlier this morning.  Thought his PCP was listed as Dr. Leward Quan. Informed patient that PCP was retiring next month.  He requested a physician.  Scheduled patient with Dr. Reginia Forts for 09/29/15.  Inquired about colonoscopy - he stated that they had checked with his gastro - Dr. Amedeo Plenty, and that he didn't need one until 10 years after the previous - not due until 2019.

## 2015-06-29 DIAGNOSIS — J330 Polyp of nasal cavity: Secondary | ICD-10-CM | POA: Diagnosis not present

## 2015-08-09 ENCOUNTER — Other Ambulatory Visit: Payer: Self-pay | Admitting: *Deleted

## 2015-08-09 DIAGNOSIS — G43909 Migraine, unspecified, not intractable, without status migrainosus: Secondary | ICD-10-CM

## 2015-08-09 MED ORDER — SUMATRIPTAN SUCCINATE 100 MG PO TABS: ORAL_TABLET | ORAL | Status: DC

## 2015-08-30 ENCOUNTER — Ambulatory Visit (INDEPENDENT_AMBULATORY_CARE_PROVIDER_SITE_OTHER): Payer: Medicare Other

## 2015-08-30 DIAGNOSIS — Z23 Encounter for immunization: Secondary | ICD-10-CM

## 2015-09-29 ENCOUNTER — Encounter: Payer: Self-pay | Admitting: Family Medicine

## 2015-10-01 ENCOUNTER — Encounter: Payer: Self-pay | Admitting: Family Medicine

## 2015-10-06 ENCOUNTER — Encounter: Payer: Self-pay | Admitting: Family Medicine

## 2015-10-07 ENCOUNTER — Ambulatory Visit (INDEPENDENT_AMBULATORY_CARE_PROVIDER_SITE_OTHER): Payer: Medicare Other | Admitting: Urgent Care

## 2015-10-07 ENCOUNTER — Encounter: Payer: Self-pay | Admitting: Urgent Care

## 2015-10-07 VITALS — BP 140/88 | HR 77 | Temp 98.0°F | Resp 18 | Ht 68.75 in | Wt 175.0 lb

## 2015-10-07 DIAGNOSIS — R030 Elevated blood-pressure reading, without diagnosis of hypertension: Secondary | ICD-10-CM

## 2015-10-07 DIAGNOSIS — E559 Vitamin D deficiency, unspecified: Secondary | ICD-10-CM

## 2015-10-07 DIAGNOSIS — F329 Major depressive disorder, single episode, unspecified: Secondary | ICD-10-CM

## 2015-10-07 DIAGNOSIS — Z0000 Encounter for general adult medical examination without abnormal findings: Secondary | ICD-10-CM

## 2015-10-07 DIAGNOSIS — F32A Depression, unspecified: Secondary | ICD-10-CM

## 2015-10-07 LAB — COMPREHENSIVE METABOLIC PANEL
ALT: 21 U/L (ref 9–46)
AST: 23 U/L (ref 10–35)
Albumin: 4.1 g/dL (ref 3.6–5.1)
Alkaline Phosphatase: 74 U/L (ref 40–115)
BUN: 12 mg/dL (ref 7–25)
CO2: 29 mmol/L (ref 20–31)
Calcium: 8.6 mg/dL (ref 8.6–10.3)
Chloride: 103 mmol/L (ref 98–110)
Creat: 0.75 mg/dL (ref 0.70–1.18)
Glucose, Bld: 72 mg/dL (ref 65–99)
Potassium: 4.1 mmol/L (ref 3.5–5.3)
Sodium: 140 mmol/L (ref 135–146)
Total Bilirubin: 0.7 mg/dL (ref 0.2–1.2)
Total Protein: 6.6 g/dL (ref 6.1–8.1)

## 2015-10-07 LAB — CBC
HEMATOCRIT: 42.4 % (ref 39.0–52.0)
HEMOGLOBIN: 14.7 g/dL (ref 13.0–17.0)
MCH: 31.6 pg (ref 26.0–34.0)
MCHC: 34.7 g/dL (ref 30.0–36.0)
MCV: 91.2 fL (ref 78.0–100.0)
MPV: 10.4 fL (ref 8.6–12.4)
Platelets: 207 10*3/uL (ref 150–400)
RBC: 4.65 MIL/uL (ref 4.22–5.81)
RDW: 13.6 % (ref 11.5–15.5)
WBC: 6.2 10*3/uL (ref 4.0–10.5)

## 2015-10-07 NOTE — Progress Notes (Signed)
MRN: [id number]  Subjective:   [name] is a 74 y.o. male presenting for annual physical exam.  Medical care team includes: PCP: Reginia Forts, MD Specialists:  Dermatology - Sees a dermatologist for eczema. Taking clobetasol for this. Patient also has an appointment in 11/2015 for evaluation of skin change over his nose. GI - Last colonoscopy performed in 2010. This was normal and was told to f/u in 10 years, our clinic tried to schedule this 2014 and we were told that patient was on a call list and would be scheduled according to their follow up schedule.   Patient is happily married. He lives in his own home with his wife. Both have good relationships and good support network. Patient is able to perform his activities of daily living without difficulty. 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 He denies feeling any confusion, disorientation, falls. He drinks a glass of wine with dinner daily. Denies smoking cigarettes or illicit drug use.   Timothy Escobar has Hip pain; Chronic depression; and Migraine headache on his problem list.  Depression - managed with Paxil. Has been on this for "many" years. He recently lost his sister due to cancer. However, patient states that he is handling this well. ROS as below. He does not need refills until 12/2015.  Vitamin D deficiency - managed with Vitamin D supplementation. States that he does well with this medication. Does not need refills. ROS as below.  Timothy Escobar has a current medication list which includes the following prescription(s): calcium-vitamin d, clobetasol ointment, fluoxetine, multivitamin with minerals, sumatriptan, vitamin c, and ergocalciferol. He has No Known Allergies.  Timothy Escobar  has a past medical history of Cancer ([place]) and Depression. Also  has past surgical history that includes Appendectomy; Hernia repair; and vocal fold polyp removed.  His family history includes Cancer in his brother, brother, father, and  sister; Dementia in his mother; Heart disease in his brother and maternal grandmother; Stroke in his maternal grandmother.  Immunizations: Flu shot 08/2015, TDAP 09/2012, Pneumo 23 05/13/2008, Zoster 2011. Declines Prevnar 13 or additional immunizations.  Review of Systems  Constitutional: Negative for fever, chills, weight loss, malaise/fatigue and diaphoresis.  HENT: Negative for congestion, ear discharge, ear pain, hearing loss, nosebleeds, sore throat and tinnitus.   Eyes: Negative for blurred vision, double vision, photophobia, pain, discharge and redness.  Respiratory: Negative for cough, shortness of breath and wheezing.   Cardiovascular: Negative for chest pain, palpitations and leg swelling.  Gastrointestinal: Negative for nausea, vomiting, abdominal pain, diarrhea, constipation and blood in stool.  Genitourinary: Negative for dysuria, urgency, frequency, hematuria and flank pain.  Musculoskeletal: Negative for myalgias, back pain and joint pain.  Skin: Negative for itching and rash.  Neurological: Negative for dizziness, tingling, seizures, loss of consciousness, weakness and headaches.  Endo/Heme/Allergies: Negative for polydipsia.  Psychiatric/Behavioral: Negative for depression, suicidal ideas, hallucinations, memory loss and substance abuse. The patient is not nervous/anxious and does not have insomnia.      Objective:   Vitals: BP 140/88 mmHg  Pulse 77  Temp(Src) 98 F (36.7 C) (Oral)  Resp 18  Ht 5' 8.75" (1.746 m)  Wt 175 lb (79.379 kg)  BMI 26.04 kg/m2  SpO2 95%  BP Readings from Last 3 Encounters:  10/07/15 140/88  11/03/14 132/78  10/07/14 136/84   Physical Exam  Constitutional: He is oriented to person, place, and time. He appears well-developed and well-nourished.  HENT:  TM's intact bilaterally, no effusions or erythema. Nasal turbinates  pink and moist, nasal passages patent. No sinus tenderness. Oropharynx clear, mucous membranes moist, dentition in  good repair.  Eyes: Conjunctivae and EOM are normal. Pupils are equal, round, and reactive to light. Right eye exhibits no discharge. Left eye exhibits no discharge. No scleral icterus.  Neck: Normal range of motion. Neck supple. No thyromegaly present.  Cardiovascular: Normal rate, regular rhythm and intact distal pulses.  Exam reveals no gallop and no friction rub.   No murmur heard. Pulmonary/Chest: No stridor. No respiratory distress. He has no wheezes. He has no rales.  Abdominal: Soft. Bowel sounds are normal. He exhibits no distension and no mass. There is no tenderness.  Musculoskeletal: Normal range of motion. He exhibits no edema or tenderness.  Strength 5/5 throughout.  Lymphadenopathy:    He has no cervical adenopathy.  Neurological: He is alert and oriented to person, place, and time. He has normal reflexes. Coordination normal.  Skin: Skin is warm and dry. No rash noted. No erythema. No pallor.  Psychiatric: His mood appears not anxious. His speech is not delayed. He is not slowed. He does not exhibit a depressed mood. He expresses no homicidal and no suicidal ideation.   Assessment and Plan :   1. Medicare annual wellness visit, subsequent - Labs pending, patient is medically healthy. - Discussed healthy lifestyle, diet, exercise, preventative care, vaccinations, and addressed patient's concerns.   2. Depression - Labs pending - Stable, continue Prozac 20mg . Okay to refill in 12/2015 for an additional 3 months since follow up should occur in 03/2016.   3. Vitamin D deficiency - Vitamin D, 25-hydroxy pending, continue current regimen for now  4. Elevated blood pressure reading without diagnosis of hypertension - Review of blood pressure from previous visits reassuring. His BP recheck was much better. Counseled patient on HTN. Patient is to check BP at home once monthly. He is to come in for recheck if his BP is consistently 99991111 systolic.  Jaynee Eagles, PA-C [facility] [phone] 10/07/2015  1:50 PM

## 2015-10-07 NOTE — Patient Instructions (Signed)

## 2015-10-08 LAB — VITAMIN D 25 HYDROXY (VIT D DEFICIENCY, FRACTURES): VIT D 25 HYDROXY: 36 ng/mL (ref 30–100)

## 2015-10-11 ENCOUNTER — Encounter: Payer: Self-pay | Admitting: Urgent Care

## 2015-10-27 ENCOUNTER — Telehealth: Payer: Self-pay

## 2015-10-27 MED ORDER — FLUOXETINE HCL 20 MG PO CAPS: 20.0000 mg | ORAL_CAPSULE | Freq: Every day | ORAL | Status: DC

## 2015-10-27 NOTE — Telephone Encounter (Signed)
Mani   Patient is looking for a script for FLUoxetine (PROZAC) 20 MG capsule   [facility] [address]  [phone] (M)

## 2015-10-27 NOTE — Telephone Encounter (Signed)
Sent RFs for pt and notified him done. Pt was very concerned that this Rx was not sent in when he was here for OV and advised Ronalee Belts that he would need a RF. I advised that it looks like Ronalee Belts was under the understanding that RF wasn't needed until March according to [facility] notes and apologized for the misunderstanding. Pt went on to say that he doesn't know where that came from and that with the new health care act, he thought that the idea was for better coordinated care among providers, etc and that it is not the first time that a Rx has slipped through the cracks. Thanked me for my follow up and call, and asked that I would let someone know his concern if I was involved in any meetings. Ronalee Belts, I thought I would send this to you so that you were aware. Pt was not angry, but emphasized several times that he was very concerned about this.

## 2015-10-27 NOTE — Telephone Encounter (Signed)
Thanks, Pamala Hurry! The patient obviously doesn't remember that he wanted it this way. I offered to refill it for him in the clinic but he said he was good until March 2017. At that point I suggested he call so that we get this taken care of. Sorry that you had to deal with this but it's on the patient if he does things like, forgets and then complains about it. Thank you, Pamala Hurry!

## 2015-11-10 DIAGNOSIS — Z85828 Personal history of other malignant neoplasm of skin: Secondary | ICD-10-CM | POA: Diagnosis not present

## 2015-11-10 DIAGNOSIS — D485 Neoplasm of uncertain behavior of skin: Secondary | ICD-10-CM | POA: Diagnosis not present

## 2015-11-10 DIAGNOSIS — L570 Actinic keratosis: Secondary | ICD-10-CM | POA: Diagnosis not present

## 2015-11-10 DIAGNOSIS — L821 Other seborrheic keratosis: Secondary | ICD-10-CM | POA: Diagnosis not present

## 2015-12-23 DIAGNOSIS — L570 Actinic keratosis: Secondary | ICD-10-CM | POA: Diagnosis not present

## 2015-12-29 DIAGNOSIS — J330 Polyp of nasal cavity: Secondary | ICD-10-CM | POA: Diagnosis not present

## 2016-04-15 ENCOUNTER — Other Ambulatory Visit: Payer: Self-pay | Admitting: Urgent Care

## 2016-06-08 ENCOUNTER — Encounter: Payer: Self-pay | Admitting: Family Medicine

## 2016-06-08 ENCOUNTER — Ambulatory Visit (INDEPENDENT_AMBULATORY_CARE_PROVIDER_SITE_OTHER): Payer: Medicare Other | Admitting: Family Medicine

## 2016-06-08 VITALS — BP 114/70 | HR 60 | Temp 98.2°F | Resp 18 | Ht 68.75 in | Wt 178.2 lb

## 2016-06-08 DIAGNOSIS — G43009 Migraine without aura, not intractable, without status migrainosus: Secondary | ICD-10-CM

## 2016-06-08 DIAGNOSIS — E559 Vitamin D deficiency, unspecified: Secondary | ICD-10-CM

## 2016-06-08 DIAGNOSIS — F329 Major depressive disorder, single episode, unspecified: Secondary | ICD-10-CM | POA: Diagnosis not present

## 2016-06-08 DIAGNOSIS — F32A Depression, unspecified: Secondary | ICD-10-CM

## 2016-06-08 MED ORDER — SUMATRIPTAN SUCCINATE 100 MG PO TABS: ORAL_TABLET | ORAL | 3 refills | Status: DC

## 2016-06-08 MED ORDER — FLUOXETINE HCL 20 MG PO CAPS: 20.0000 mg | ORAL_CAPSULE | Freq: Every day | ORAL | 3 refills | Status: DC

## 2016-06-08 NOTE — Progress Notes (Signed)
By signing my name below, I, Mesha Guinyard, attest that this documentation has been prepared under the direction and in the presence of Merri Ray, MD.  Electronically Signed: Verlee Monte, Medical Scribe. 06/08/16. 10:08 AM.  Subjective:    Patient ID: Timothy Escobar, male    DOB: 01-Dec-1940, 75 y.o.   MRN: [id number]  HPI Chief Complaint  Patient presents with  . Annual Exam    CPE    HPI Comments: Timothy Escobar is a 75 y.o. male who presents to the [facility] for his complete physical. His last physical was in Dec 2016, so his physical will be too soon today. He came for a health maintenance.  Colon CA Screening: Colonoscopy was in 2009, planned to repeat in 10 years. Dr. Amedeo Plenty Prostate CA Screening: Last check was in Dec 2016 with nl results  Immunizations: Pt doesn't care to get his Prevnar vaccine. Immunization History  Administered Date(s) Administered  . Influenza,inj,Quad PF,36+ Mos 07/18/2013, 07/29/2014, 08/30/2015  . Influenza-Unspecified 07/23/2012  . Pneumococcal Polysaccharide-23 05/13/2008  . Td 05/13/2008  . Tdap 09/26/2012  . Zoster 10/23/2009   HAs: He did have a borderline bp of 140/88 at his medicare wellness visit last year. Hx of migraine per chart uses imitrex 100 mg PRN. Pt's left sided HA on the surface HA's aren't that bad any more and he can go months without a HA. He takes 1 tablet every 1.5 weeks. Pt goes to see Dr. Domingo Cocking and has tried a number of preventative medications that didn't work for him due to the negative side affects.   Depression: Takes Prozac 20 mg QD. Pt mentions his depression is doing fine and life is fine. He had the option to get off of Prozac about 20 years ago, but chose to stay on. Pt exercises frequently and he does a lot of DIY home projects. Pt has a glass of wine every day. Pt denies suicidal ideation, thoughts of self harm, or thoughts of hurting other people.  Depression screen [facility] 2/9  06/08/2016 10/07/2015 10/07/2014 10/02/2013  Decreased Interest 0 0 0 0  Down, Depressed, Hopeless 0 1 0 0  PHQ - 2 Score 0 1 0 0   Vit D. Deficiency: Last Vitamin D  level was nl at 36 in Dec 2016. Pt takes 100 units QD  Advance Directives: Pt does have a living will.  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  Patient Active Problem List   Diagnosis Date Noted  . Hip pain 09/28/2012  . Chronic depression 09/28/2012   Past Medical History:  Diagnosis Date  . Blood transfusion without reported diagnosis   . Cancer ([place])    skin cancer  . Depression    Past Surgical History:  Procedure Laterality Date  . APPENDECTOMY    . HERNIA REPAIR    . vocal fold polyp removed     No Known Allergies Prior to Admission medications   Medication Sig Start Date End Date Taking? Authorizing Provider  calcium-vitamin D (OSCAL WITH D) 500-200 MG-UNIT per tablet Take 1 tablet by mouth daily. 1,000 of Vit D3    Historical Provider, MD  clobetasol ointment (TEMOVATE) 0.05 %  08/30/15   Historical Provider, MD  FLUoxetine (PROZAC) 20 MG capsule TAKE ONE CAPSULE BY MOUTH ONCE DAILY 04/16/16   Jaynee Eagles, PA-C  Multiple Vitamins-Minerals (MULTIVITAMIN WITH MINERALS) tablet Take 1 tablet by mouth daily. Centrum Silver    Historical Provider, MD  SUMAtriptan (IMITREX) 100 MG tablet TAKE AS DIRECTED. 08/09/15   Harrison Mons, PA-C  vitamin C (ASCORBIC ACID) 500 MG tablet Take 500 mg by mouth daily.    Historical Provider, MD  VITAMIN D, ERGOCALCIFEROL, PO Take 1,000 Units by mouth.    Historical Provider, MD   Social History   Social History  . Marital status: Married    Spouse name: N/A  . Number of children: 2  . Years of education: college   Occupational History  . Retired    Social History Main Topics  . Smoking status: Never Smoker  . Smokeless tobacco: Never Used  . Alcohol use 3.5 oz/week    7 Standard drinks or equivalent per week     Comment: wine: 1 glass daily  . Drug  use: No  . Sexual activity: No   Other Topics Concern  . Not on file   Social History Narrative   Exercise walk/gardening daily for 1 hr plus   Depression screen [facility] 2/9 06/08/2016 10/07/2015 10/07/2014 10/02/2013  Decreased Interest 0 0 0 0  Down, Depressed, Hopeless 0 1 0 0  PHQ - 2 Score 0 1 0 0   Review of Systems  Neurological: Positive for headaches (chronic).  Psychiatric/Behavioral: Negative for dysphoric mood, self-injury and suicidal ideas.   Objective:  Physical Exam  Constitutional: He appears well-developed and well-nourished. No distress.  HENT:  Head: Normocephalic and atraumatic.  Eyes: Conjunctivae are normal. Pupils are equal, round, and reactive to light. Right eye exhibits no nystagmus. Left eye exhibits no nystagmus.  Neck: Neck supple.  Cardiovascular: Normal rate.   Pulmonary/Chest: Effort normal.  Neurological: He is alert.  Non focal neuro exam No pronator drift  Skin: Skin is warm and dry.  Psychiatric: He has a normal mood and affect. His behavior is normal.  Nursing note and vitals reviewed.  BP 114/70   Pulse 60   Temp 98.2 F (36.8 C) (Oral)   Resp 18   Ht 5' 8.75" (1.746 m)   Wt 178 lb 3.2 oz (80.8 kg)   SpO2 96%   BMI 26.51 kg/m  Assessment & Plan:   Timothy Escobar is a 75 y.o. male Depression - Plan: FLUoxetine (PROZAC) 20 MG capsule  -Stable.Marland Kitchen No change in medications. Discussed potential for trial off medication in the future.  Migraine without aura and without status migrainosus, not intractable - Plan: SUMAtriptan (IMITREX) 100 MG tablet  - Overall stable. Imitrex refill. If more persistent or frequent headaches, discussed prophylactic/daily medication. Deferred at this time.  Vitamin D deficiency Continue over-the-counter vitamin D supplement  Meds ordered this encounter  Medications  . FLUoxetine (PROZAC) 20 MG capsule    Sig: Take 1 capsule (20 mg total) by mouth daily.    Dispense:  90 capsule    Refill:  3  .  SUMAtriptan (IMITREX) 100 MG tablet    Sig: TAKE AS DIRECTED.    Dispense:  12 tablet    Refill:  3   Patient Instructions       IF you received an x-ray today, you will receive an invoice from [facility]. Please contact [facility] at [phone] with questions or concerns regarding your invoice.   IF you received labwork today, you will receive an invoice from [employer]. Please contact [facility] at [phone] with questions or concerns regarding your invoice.   Our billing staff will not be able to assist you with questions regarding bills from these companies.  You  will be contacted with the lab results as soon as they are available. The fastest way to get your results is to activate your My Chart account. Instructions are located on the last page of this paperwork. If you have not heard from [place] regarding the results in 2 weeks, please contact this office.        I personally performed the services described in this documentation, which was scribed in my presence. The recorded information has been reviewed and considered, and addended by me as needed.   Signed,   Merri Ray, MD [facility].  06/09/16 1:49 PM

## 2016-06-08 NOTE — Patient Instructions (Signed)
     IF you received an x-ray today, you will receive an invoice from [facility]. Please contact [facility] at [phone] with questions or concerns regarding your invoice.   IF you received labwork today, you will receive an invoice from [facility]. Please contact [facility] at [phone] with questions or concerns regarding your invoice.   Our billing staff will not be able to assist you with questions regarding bills from these companies.  You will be contacted with the lab results as soon as they are available. The fastest way to get your results is to activate your My Chart account. Instructions are located on the last page of this paperwork. If you have not heard from us regarding the results in 2 weeks, please contact this office.      

## 2016-07-20 ENCOUNTER — Ambulatory Visit (INDEPENDENT_AMBULATORY_CARE_PROVIDER_SITE_OTHER): Payer: Medicare Other

## 2016-07-20 DIAGNOSIS — Z23 Encounter for immunization: Secondary | ICD-10-CM

## 2016-08-15 ENCOUNTER — Encounter (HOSPITAL_COMMUNITY)
Admission: EM | Disposition: A | Discharge status: Home / Self Care | Payer: Self-pay | Source: Other Acute Inpatient Hospital | Attending: Cardiovascular Disease

## 2016-08-15 SURGERY — LEFT HEART CATH AND CORONARY ANGIOGRAPHY
Anesthesia: LOCAL

## 2016-08-22 ENCOUNTER — Inpatient Hospital Stay (HOSPITAL_COMMUNITY): Payer: Medicare Other

## 2016-08-22 ENCOUNTER — Encounter (HOSPITAL_COMMUNITY)
Admission: EM | Disposition: A | Discharge status: Home / Self Care | Payer: Self-pay | Source: Other Acute Inpatient Hospital | Attending: Cardiovascular Disease

## 2016-08-22 ENCOUNTER — Ambulatory Visit (HOSPITAL_COMMUNITY)
Admission: EM | Admit: 2016-08-22 | Payer: Self-pay | Source: Other Acute Inpatient Hospital | Admitting: Cardiovascular Disease

## 2016-08-22 ENCOUNTER — Encounter (HOSPITAL_COMMUNITY): Payer: Self-pay | Admitting: Physician Assistant

## 2016-08-22 ENCOUNTER — Inpatient Hospital Stay (HOSPITAL_COMMUNITY)
Admission: EM | Admit: 2016-08-22 | Discharge: 2016-08-25 | DRG: 247 | Disposition: A | Discharge status: Home / Self Care | Payer: Medicare Other | Source: Other Acute Inpatient Hospital | Attending: Cardiovascular Disease | Admitting: Cardiovascular Disease

## 2016-08-22 DIAGNOSIS — I213 ST elevation (STEMI) myocardial infarction of unspecified site: Secondary | ICD-10-CM | POA: Insufficient documentation

## 2016-08-22 DIAGNOSIS — Z79899 Other long term (current) drug therapy: Secondary | ICD-10-CM | POA: Diagnosis not present

## 2016-08-22 DIAGNOSIS — Z8249 Family history of ischemic heart disease and other diseases of the circulatory system: Secondary | ICD-10-CM | POA: Diagnosis not present

## 2016-08-22 DIAGNOSIS — J339 Nasal polyp, unspecified: Secondary | ICD-10-CM

## 2016-08-22 DIAGNOSIS — Z955 Presence of coronary angioplasty implant and graft: Secondary | ICD-10-CM | POA: Diagnosis not present

## 2016-08-22 DIAGNOSIS — J338 Other polyp of sinus: Secondary | ICD-10-CM | POA: Diagnosis not present

## 2016-08-22 DIAGNOSIS — I2510 Atherosclerotic heart disease of native coronary artery without angina pectoris: Secondary | ICD-10-CM | POA: Diagnosis present

## 2016-08-22 DIAGNOSIS — E876 Hypokalemia: Secondary | ICD-10-CM

## 2016-08-22 DIAGNOSIS — I2102 ST elevation (STEMI) myocardial infarction involving left anterior descending coronary artery: Secondary | ICD-10-CM | POA: Diagnosis not present

## 2016-08-22 DIAGNOSIS — R079 Chest pain, unspecified: Secondary | ICD-10-CM | POA: Diagnosis not present

## 2016-08-22 DIAGNOSIS — Z85828 Personal history of other malignant neoplasm of skin: Secondary | ICD-10-CM

## 2016-08-22 DIAGNOSIS — R001 Bradycardia, unspecified: Secondary | ICD-10-CM | POA: Diagnosis present

## 2016-08-22 DIAGNOSIS — E785 Hyperlipidemia, unspecified: Secondary | ICD-10-CM | POA: Diagnosis present

## 2016-08-22 DIAGNOSIS — I2109 ST elevation (STEMI) myocardial infarction involving other coronary artery of anterior wall: Secondary | ICD-10-CM | POA: Diagnosis not present

## 2016-08-22 HISTORY — PX: CARDIAC CATHETERIZATION: SHX172

## 2016-08-22 LAB — COMPREHENSIVE METABOLIC PANEL
ALBUMIN: 4 g/dL (ref 3.5–5.0)
ALT: 22 U/L (ref 17–63)
ANION GAP: 16 — AB (ref 5–15)
AST: 31 U/L (ref 15–41)
Alkaline Phosphatase: 73 U/L (ref 38–126)
BILIRUBIN TOTAL: 0.5 mg/dL (ref 0.3–1.2)
BUN: 17 mg/dL (ref 6–20)
CO2: 15 mmol/L — ABNORMAL LOW (ref 22–32)
Calcium: 9.3 mg/dL (ref 8.9–10.3)
Chloride: 109 mmol/L (ref 101–111)
Creatinine, Ser: 1.02 mg/dL (ref 0.61–1.24)
GFR calc non Af Amer: >60 mL/min (ref >60)
GLUCOSE: 163 mg/dL — AB (ref 65–99)
POTASSIUM: 2.8 mmol/L — AB (ref 3.5–5.1)
Sodium: 140 mmol/L (ref 135–145)
TOTAL PROTEIN: 6.3 g/dL — AB (ref 6.5–8.1)

## 2016-08-22 LAB — CBC WITH DIFFERENTIAL/PLATELET
Basophils Absolute: 0.1 10*3/uL (ref 0.0–0.1)
Basophils Relative: 1 %
EOS ABS: 0.2 10*3/uL (ref 0.0–0.7)
EOS PCT: 3 %
HCT: 41 % (ref 39.0–52.0)
Hemoglobin: 14.5 g/dL (ref 13.0–17.0)
LYMPHS ABS: 3 10*3/uL (ref 0.7–4.0)
Lymphocytes Relative: 43 %
MCH: 31.1 pg (ref 26.0–34.0)
MCHC: 35.4 g/dL (ref 30.0–36.0)
MCV: 88 fL (ref 78.0–100.0)
MONOS PCT: 9 %
Monocytes Absolute: 0.6 10*3/uL (ref 0.1–1.0)
Neutro Abs: 3.1 10*3/uL (ref 1.7–7.7)
Neutrophils Relative %: 44 %
PLATELETS: 225 10*3/uL (ref 150–400)
RBC: 4.66 MIL/uL (ref 4.22–5.81)
RDW: 12.5 % (ref 11.5–15.5)
WBC: 7 10*3/uL (ref 4.0–10.5)

## 2016-08-22 LAB — TROPONIN I
TROPONIN I: 1.65 ng/mL — AB (ref <0.03)
Troponin I: <0.03 ng/mL (ref <0.03)

## 2016-08-22 LAB — PROTIME-INR
INR: 1.02
PROTHROMBIN TIME: 13.4 s (ref 11.4–15.2)

## 2016-08-22 LAB — MRSA PCR SCREENING: MRSA BY PCR: NEGATIVE

## 2016-08-22 LAB — POCT ACTIVATED CLOTTING TIME: Activated Clotting Time: 461 seconds

## 2016-08-22 LAB — TSH: TSH: 1.447 u[IU]/mL (ref 0.350–4.500)

## 2016-08-22 LAB — APTT: aPTT: 28 seconds (ref 24–36)

## 2016-08-22 LAB — MAGNESIUM: MAGNESIUM: 2 mg/dL (ref 1.7–2.4)

## 2016-08-22 SURGERY — LEFT HEART CATH AND CORONARY ANGIOGRAPHY
Anesthesia: LOCAL

## 2016-08-22 MED ORDER — LIDOCAINE HCL (PF) 1 % IJ SOLN
INTRAMUSCULAR | Status: DC | PRN
  Administered 2016-08-22: 25 mL

## 2016-08-22 MED ORDER — TIROFIBAN HCL IN NACL 5-0.9 MG/100ML-% IV SOLN
INTRAVENOUS | Status: DC | PRN
  Administered 2016-08-22: 0.15 ug/kg/min via INTRAVENOUS

## 2016-08-22 MED ORDER — MORPHINE SULFATE (PF) 10 MG/ML IV SOLN
INTRAVENOUS | Status: AC
  Filled 2016-08-22: qty 1

## 2016-08-22 MED ORDER — ASPIRIN 81 MG PO CHEW: 324.0000 mg | CHEWABLE_TABLET | ORAL | Status: DC

## 2016-08-22 MED ORDER — ZOLPIDEM TARTRATE 5 MG PO TABS: 5.0000 mg | ORAL_TABLET | Freq: Every evening | ORAL | Status: DC | PRN

## 2016-08-22 MED ORDER — FENTANYL CITRATE (PF) 100 MCG/2ML IJ SOLN
INTRAMUSCULAR | Status: AC
  Filled 2016-08-22: qty 2

## 2016-08-22 MED ORDER — SODIUM CHLORIDE 0.9 % IV SOLN
INTRAVENOUS | Status: DC
Start: 2016-08-22 — End: 2016-08-25
  Administered 2016-08-23: 03:00:00 via INTRAVENOUS

## 2016-08-22 MED ORDER — SODIUM CHLORIDE 0.9% FLUSH
3.0000 mL | Freq: Two times a day (BID) | INTRAVENOUS | Status: DC
  Administered 2016-08-23 – 2016-08-25 (×5): 3 mL via INTRAVENOUS

## 2016-08-22 MED ORDER — MORPHINE SULFATE (PF) 10 MG/ML IV SOLN
INTRAVENOUS | Status: DC | PRN
  Administered 2016-08-22: 4 mg via INTRAVENOUS

## 2016-08-22 MED ORDER — DIAZEPAM 5 MG PO TABS: 5.0000 mg | ORAL_TABLET | ORAL | Status: DC | PRN

## 2016-08-22 MED ORDER — SODIUM CHLORIDE 0.9 % IV SOLN
INTRAVENOUS | Status: DC | PRN
  Administered 2016-08-22: 10 mL/h via INTRAVENOUS

## 2016-08-22 MED ORDER — TICAGRELOR 90 MG PO TABS: 90.0000 mg | ORAL_TABLET | Freq: Two times a day (BID) | ORAL | Status: DC

## 2016-08-22 MED ORDER — TIROFIBAN (AGGRASTAT) BOLUS VIA INFUSION
INTRAVENOUS | Status: DC | PRN
  Administered 2016-08-22: 2025 ug via INTRAVENOUS

## 2016-08-22 MED ORDER — MIDAZOLAM HCL 2 MG/2ML IJ SOLN
INTRAMUSCULAR | Status: AC
  Filled 2016-08-22: qty 2

## 2016-08-22 MED ORDER — ATORVASTATIN CALCIUM 80 MG PO TABS
80.0000 mg | ORAL_TABLET | Freq: Every day | ORAL | Status: DC
  Administered 2016-08-22 – 2016-08-24 (×3): 80 mg via ORAL
  Filled 2016-08-22 (×3): qty 1

## 2016-08-22 MED ORDER — ONDANSETRON HCL 4 MG/2ML IJ SOLN: 4.0000 mg | Freq: Four times a day (QID) | INTRAMUSCULAR | Status: DC | PRN

## 2016-08-22 MED ORDER — BIVALIRUDIN 250 MG IV SOLR
INTRAVENOUS | Status: AC
  Filled 2016-08-22: qty 250

## 2016-08-22 MED ORDER — HEPARIN (PORCINE) IN NACL 2-0.9 UNIT/ML-% IJ SOLN
INTRAMUSCULAR | Status: AC
  Filled 2016-08-22: qty 500

## 2016-08-22 MED ORDER — NITROGLYCERIN 1 MG/10 ML FOR IR/CATH LAB
INTRA_ARTERIAL | Status: DC | PRN
  Administered 2016-08-22 (×3): 200 ug via INTRACORONARY

## 2016-08-22 MED ORDER — MIDAZOLAM HCL 2 MG/2ML IJ SOLN
INTRAMUSCULAR | Status: DC | PRN
  Administered 2016-08-22 (×2): 1 mg via INTRAVENOUS

## 2016-08-22 MED ORDER — IOPAMIDOL (ISOVUE-370) INJECTION 76%
INTRAVENOUS | Status: AC
  Filled 2016-08-22: qty 125

## 2016-08-22 MED ORDER — TICAGRELOR 90 MG PO TABS
90.0000 mg | ORAL_TABLET | Freq: Two times a day (BID) | ORAL | Status: DC
  Administered 2016-08-23 – 2016-08-25 (×5): 90 mg via ORAL
  Filled 2016-08-22 (×5): qty 1

## 2016-08-22 MED ORDER — POTASSIUM CHLORIDE CRYS ER 20 MEQ PO TBCR: 40.0000 meq | EXTENDED_RELEASE_TABLET | Freq: Every day | ORAL | Status: DC

## 2016-08-22 MED ORDER — SODIUM CHLORIDE 0.9% FLUSH: 3.0000 mL | INTRAVENOUS | Status: DC | PRN

## 2016-08-22 MED ORDER — LIDOCAINE HCL (PF) 1 % IJ SOLN
INTRAMUSCULAR | Status: AC
  Filled 2016-08-22: qty 30

## 2016-08-22 MED ORDER — ASPIRIN EC 81 MG PO TBEC
81.0000 mg | DELAYED_RELEASE_TABLET | Freq: Every day | ORAL | Status: DC
  Administered 2016-08-23 – 2016-08-25 (×3): 81 mg via ORAL
  Filled 2016-08-22 (×3): qty 1

## 2016-08-22 MED ORDER — BIVALIRUDIN BOLUS VIA INFUSION - CUPID
INTRAVENOUS | Status: DC | PRN
  Administered 2016-08-22: 60.75 mg via INTRAVENOUS

## 2016-08-22 MED ORDER — MORPHINE SULFATE (PF) 2 MG/ML IV SOLN: 2.0000 mg | INTRAVENOUS | Status: DC | PRN

## 2016-08-22 MED ORDER — POTASSIUM CHLORIDE CRYS ER 20 MEQ PO TBCR
40.0000 meq | EXTENDED_RELEASE_TABLET | Freq: Once | ORAL | Status: AC
  Administered 2016-08-22: 40 meq via ORAL
  Filled 2016-08-22: qty 2

## 2016-08-22 MED ORDER — ASPIRIN 300 MG RE SUPP
300.0000 mg | RECTAL | Status: DC
  Filled 2016-08-22: qty 1

## 2016-08-22 MED ORDER — TICAGRELOR 90 MG PO TABS
ORAL_TABLET | ORAL | Status: AC
  Filled 2016-08-22: qty 1

## 2016-08-22 MED ORDER — TIROFIBAN HCL IN NACL 5-0.9 MG/100ML-% IV SOLN
INTRAVENOUS | Status: AC
  Filled 2016-08-22: qty 100

## 2016-08-22 MED ORDER — SODIUM CHLORIDE 0.9 % IV SOLN: 250.0000 mL | INTRAVENOUS | Status: DC | PRN

## 2016-08-22 MED ORDER — TICAGRELOR 90 MG PO TABS
ORAL_TABLET | ORAL | Status: DC | PRN
  Administered 2016-08-22: 180 mg via ORAL

## 2016-08-22 MED ORDER — IOPAMIDOL (ISOVUE-370) INJECTION 76%
INTRAVENOUS | Status: DC | PRN
  Administered 2016-08-22: 205 mL via INTRA_ARTERIAL

## 2016-08-22 MED ORDER — ASPIRIN 81 MG PO CHEW: 81.0000 mg | CHEWABLE_TABLET | Freq: Every day | ORAL | Status: DC

## 2016-08-22 MED ORDER — ACETAMINOPHEN 325 MG PO TABS
650.0000 mg | ORAL_TABLET | ORAL | Status: DC | PRN
  Filled 2016-08-22: qty 2

## 2016-08-22 MED ORDER — IOPAMIDOL (ISOVUE-370) INJECTION 76%
INTRAVENOUS | Status: AC
  Filled 2016-08-22: qty 100

## 2016-08-22 MED ORDER — HEPARIN SODIUM (PORCINE) 1000 UNIT/ML IJ SOLN
INTRAMUSCULAR | Status: AC
  Filled 2016-08-22: qty 1

## 2016-08-22 MED ORDER — TIROFIBAN HCL IN NACL 5-0.9 MG/100ML-% IV SOLN
0.1500 ug/kg/min | INTRAVENOUS | Status: AC
  Administered 2016-08-22 – 2016-08-23 (×3): 0.15 ug/kg/min via INTRAVENOUS
  Filled 2016-08-22 (×2): qty 100

## 2016-08-22 MED ORDER — ACETAMINOPHEN 325 MG PO TABS
650.0000 mg | ORAL_TABLET | ORAL | Status: DC | PRN
  Administered 2016-08-23 – 2016-08-25 (×2): 650 mg via ORAL
  Filled 2016-08-22 (×2): qty 2

## 2016-08-22 MED ORDER — NITROGLYCERIN 0.4 MG SL SUBL: 0.4000 mg | SUBLINGUAL_TABLET | SUBLINGUAL | Status: DC | PRN

## 2016-08-22 MED ORDER — HEPARIN (PORCINE) IN NACL 2-0.9 UNIT/ML-% IJ SOLN
INTRAMUSCULAR | Status: DC | PRN
  Administered 2016-08-22: 1000 mL

## 2016-08-22 MED ORDER — NITROGLYCERIN 1 MG/10 ML FOR IR/CATH LAB
INTRA_ARTERIAL | Status: AC
  Filled 2016-08-22: qty 10

## 2016-08-22 MED ORDER — SODIUM CHLORIDE 0.9 % IV SOLN
INTRAVENOUS | Status: DC | PRN
  Administered 2016-08-22 (×2): 1.75 mg/kg/h via INTRAVENOUS

## 2016-08-22 MED ORDER — POTASSIUM CHLORIDE CRYS ER 20 MEQ PO TBCR
40.0000 meq | EXTENDED_RELEASE_TABLET | Freq: Once | ORAL | Status: AC
  Administered 2016-08-23: 40 meq via ORAL
  Filled 2016-08-22: qty 2

## 2016-08-22 SURGICAL SUPPLY — 18 items
BALLN TREK RX 2.5X12 (BALLOONS) ×2
BALLN ~~LOC~~ EMERGE MR 3.25X15 (BALLOONS) ×2
BALLOON TREK RX 2.5X12 (BALLOONS) ×1 IMPLANT
BALLOON ~~LOC~~ EMERGE MR 3.25X15 (BALLOONS) ×1 IMPLANT
CATH INFINITI 5FR ANG PIGTAIL (CATHETERS) ×2 IMPLANT
CATH INFINITI JR4 5F (CATHETERS) ×2 IMPLANT
CATH VISTA GUIDE 6FR XBLAD4 (CATHETERS) ×2 IMPLANT
ELECT DEFIB PAD ADLT CADENCE (PAD) ×2 IMPLANT
GUIDEWIRE 3MM J TIP .035 145 (WIRE) ×2 IMPLANT
KIT ENCORE 26 ADVANTAGE (KITS) ×2 IMPLANT
KIT HEART LEFT (KITS) ×2 IMPLANT
PACK CARDIAC CATHETERIZATION (CUSTOM PROCEDURE TRAY) ×2 IMPLANT
SHEATH PINNACLE 6F 10CM (SHEATH) ×2 IMPLANT
STENT SYNERGY DES 2.75X24 (Permanent Stent) ×2 IMPLANT
STENT SYNERGY DES 3X16 (Permanent Stent) ×2 IMPLANT
TRANSDUCER W/STOPCOCK (MISCELLANEOUS) ×2 IMPLANT
TUBING CIL FLEX 10 FLL-RA (TUBING) ×2 IMPLANT
WIRE PT2 MS 185 (WIRE) ×2 IMPLANT

## 2016-08-22 NOTE — Progress Notes (Signed)
ANTICOAGULATION CONSULT NOTE - Initial Consult  Pharmacy Consult for Aggrastat x18hrs Indication: post cardiac cath  No Known Allergies  Patient Measurements: Height: 5\' 8"  (172.7 cm) Weight: 176 lb 9.4 oz (80.1 kg) IBW/kg (Calculated) : 68.4  Vital Signs: BP: 114/95 (10/31 1945) Pulse Rate: 56 (10/31 1945)  Labs:  Recent Labs  08/22/16 1754  HGB 14.5  HCT 41.0  PLT 225  APTT 28  LABPROT 13.4  INR 1.02  CREATININE 1.02  TROPONINI <0.03    Estimated Creatinine Clearance: 62.4 mL/min (by C-G formula based on SCr of 1.02 mg/dL).   Medical History: Past Medical History:  Diagnosis Date  . Blood transfusion without reported diagnosis   . Cancer ([place])    skin cancer  . Depression    Assessment: 75 year old male s/p code stemi now s/p cath and PCI to LAD treated with DES stent on aggrastat for 81 hours. CBC was within normal limits on admission. No bleeding reported. Angiomax bag completed.   Goal of Therapy:  Monitor platelets by anticoagulation protocol: Yes   Plan:  Continue Aggrastat at 0.15 mcg/kg/min for 18 hours. Order entered for RN.  CBC in AM.   Sloan Leiter, PharmD, BCPS Clinical Pharmacist [phone] 08/22/2016,7:55 PM

## 2016-08-22 NOTE — H&P (Signed)
History and Physical   Patient ID: Timothy Escobar MRN: [id number], DOB/AGE: 73-Jul-1942 73 y.o. Date of Encounter: 08/22/2016  Primary Physician: Reginia Forts, MD Primary Cardiologist: New, Dr Claiborne Billings  Chief Complaint:   STEMI  HPI: Timothy Escobar is a 75 y.o. male with a history of skin CA. No hx HTN, DM, HLD. +FH CAD in his sister.  Today, pt moving wood using a wheelbarrow. He had sudden onset of chest pressure at about 5:00 pm. Initially thought it was indigestion, but then developed L arm tingling and slight SOB, fingers cramping. Sx worsened, EMS called. His ECG was c/w anterior STEMI. He was given ASA by his wife, EMS gave him SL NTG, but he was still having severe pain and abnormal ECG on arrival. Pt taken emergently to the cath lab.  Never had anything like this before. No hx DOE, decreased exertion due to fatigue, never had chest pain before.  Past Medical History:  Diagnosis Date  . Blood transfusion without reported diagnosis   . Cancer ([place])    skin cancer  . Depression     Surgical History:  Past Surgical History:  Procedure Laterality Date  . APPENDECTOMY    . HERNIA REPAIR    . vocal fold polyp removed       I have reviewed the patient's current medications. Prior to Admission medications   Medication Sig Start Date End Date Taking? Authorizing Provider  calcium-vitamin D (OSCAL WITH D) 500-200 MG-UNIT per tablet Take 1 tablet by mouth daily. 1,000 of Vit D3    Historical Provider, MD  clobetasol ointment (TEMOVATE) 0.05 %  08/30/15   Historical Provider, MD  FLUoxetine (PROZAC) 20 MG capsule Take 1 capsule (20 mg total) by mouth daily. 06/08/16   Wendie Agreste, MD  Multiple Vitamins-Minerals (MULTIVITAMIN WITH MINERALS) tablet Take 1 tablet by mouth daily. Centrum Silver    Historical Provider, MD  SUMAtriptan (IMITREX) 100 MG tablet TAKE AS DIRECTED. 06/08/16   Wendie Agreste, MD  vitamin C (ASCORBIC ACID) 500 MG tablet Take 500 mg by mouth daily.     Historical Provider, MD  VITAMIN D, ERGOCALCIFEROL, PO Take 1,000 Units by mouth.    Historical Provider, MD   Scheduled Meds: . aspirin  324 mg Oral NOW   Or  . aspirin  300 mg Rectal NOW  . [START ON 08/23/2016] aspirin  81 mg Oral Daily  . [START ON 08/23/2016] aspirin EC  81 mg Oral Daily  . atorvastatin  80 mg Oral q1800  . sodium chloride flush  3 mL Intravenous Q12H  . ticagrelor  90 mg Oral BID   Continuous Infusions: . sodium chloride     PRN Meds:.sodium chloride, acetaminophen, diazepam, morphine injection, nitroGLYCERIN, ondansetron (ZOFRAN) IV, ondansetron (ZOFRAN) IV, sodium chloride flush, zolpidem  Allergies: No Known Allergies  Social History   Social History  . Marital status: Married    Spouse name: N/A  . Number of children: 2  . Years of education: college   Occupational History  . Retired - Water quality scientist    Social History Main Topics  . Smoking status: Never Smoker  . Smokeless tobacco: Never Used  . Alcohol use 3.5 oz/week    7 Standard drinks or equivalent per week     Comment: wine: 1 glass daily  . Drug use: No  . Sexual activity: No   Other Topics Concern  . Not on file   Social History Narrative   Lives  w/ wife in [place]. 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, plus the church choir.   Exercise walk/gardening daily for 1 hr plus    Family History  Problem Relation Age of Onset  . Dementia Mother   . Cancer Father     skin  . Heart disease Maternal Grandmother   . Stroke Maternal Grandmother   . Cancer Sister     breast (pt did not specific L or R)  . Cancer Brother     bladder and pancreatic  . Heart disease Brother   . Cancer Brother   . Heart disease Sister   . Cancer Sister   . Hypertension Sister   . Cancer Paternal Grandmother    Family Status  Relation Status  . Mother Deceased  . Father Deceased  . Maternal Grandmother Deceased  . Sister Alive  . Brother  Deceased  . Brother Alive  . Sister Deceased  . Son Alive  . Maternal Grandfather Deceased   old age  . Paternal Grandmother Deceased  . Paternal Grandfather Deceased  . Son Alive    Review of Systems:   Full 14-point review of systems otherwise negative except as noted above.  Physical Exam: Blood pressure 128/85, pulse (!) 54, resp. rate 16, SpO2 96 %. General: Well developed, well nourished,male in no acute distress. Head: Normocephalic, atraumatic, sclera non-icteric, no xanthomas, nares are without discharge. Dentition: good Neck: No carotid bruits. JVD not elevated. No thyromegally Lungs: Good expansion bilaterally. without wheezes or rhonchi.  Heart: Regular rate and rhythm with S1 S2.  No S3 or S4.  No murmur, no rubs, or gallops appreciated. Abdomen: Soft, non-tender, non-distended with normoactive bowel sounds. No hepatomegaly. No rebound/guarding. No obvious abdominal masses. Msk:  Strength and tone appear normal for age. No joint deformities or effusions, no spine or costo-vertebral angle tenderness. Extremities: No clubbing or cyanosis. No edema.  Distal pedal pulses are 2+ in 4 extrem Neuro: Alert and oriented X 3. Moves all extremities spontaneously. No focal deficits noted. Psych:  Responds to questions appropriately with a normal affect. Skin: No rashes or lesions noted  Labs:   Lab Results  Component Value Date   WBC 7.0 08/22/2016   HGB 14.5 08/22/2016   HCT 41.0 08/22/2016   MCV 88.0 08/22/2016   PLT 225 08/22/2016    Recent Labs  08/22/16 1754  INR 1.02     Recent Labs Lab 08/22/16 1754  NA 140  K 2.8*  CL 109  CO2 15*  BUN 17  CREATININE 1.02  CALCIUM 9.3  PROT 6.3*  BILITOT 0.5  ALKPHOS 73  ALT 22  AST 31  GLUCOSE 163*    Recent Labs  08/22/16 1754  TROPONINI <0.03   Lab Results  Component Value Date   CHOL 202 (H) 10/07/2014   HDL 63 10/07/2014   LDLCALC 124 (H) 10/07/2014   TRIG 76 10/07/2014   Radiology/Studies: No  results found.   ECG: EMS ECG w/ 11 mm ST elevation in V leads.  ASSESSMENT AND PLAN:  Principal Problem:   Acute ST elevation myocardial infarction (STEMI) involving left anterior descending (LAD) coronary artery (HCC) - S/P emergent cath w/ PCI of the LAD - we will start him on ASA, Brilinta and high-dose statin.  - his BP and HR are well-controlled, will try to add low-dose Coreg - if does well w/ this, may be able to add low-dose lisinopril in am -  cardiac rehab to see. - ck lipids in am.  Augusto Garbe 08/22/2016 7:40 PM Beeper [id number]   Patient seen and examined. Agree with assessment and plan.  Timothy Escobar is a 75 year old male who does not have any known prior cardiac history.  He has been active and sings in the [facility].  At approximate 5 PM this evening.  He was moving wood logs in a wheelbarrow and developed severe acute substernal chest pressure.  The pain intensified severely such that EMS was immediately contacted.  EMS arrived within 15 minutes and ECG revealed 11 mm of anterior ST segment elevation.  A code STEMI was activated.  He was transported directly to the cardiac catheterization laboratory.  Upon arrival to the laboratory.  He was in writing.  Chest pain.  I discussed the need for emergent catheterization.  Catheterization revealed total LAD occlusion with TIMI 0 flow and a very large LAD system.  He ultimately underwent tandem stenting of his LAD with insertion of a 3.016 mm and 2.7524 mm overlapping synergy DES stents.  Border balloon time was excellent at 19 minutes.  At the completion of the procedure.  His ST segment elevation completely resolved and he was chest pain-free.  Due to significant initial thrombus which move downstream to the ostium of the second diagonal, Aggrastat was started in addition to Angiomax.  He left the catheterization laboratory chest pain-free.  Hopefully, reperfusion from chest pain onset was in the range  of 1 hour and there may be potential for significant myocardial salvage with this early reperfusion time.  A 2-D echo Doppler study will be ordered.  He will be started on high potency statin, low-dose carvedilol and ACE inhibition with continuation for dual antiplatelet therapy for a minimum of a year.   Troy Sine, MD, [facility] 08/22/2016 8:16 PM

## 2016-08-22 NOTE — Progress Notes (Signed)
   08/22/16 1727  Clinical Encounter Type  Visited With Family  Visit Type Code  Referral From Nurse  Spiritual Encounters  Spiritual Needs Emotional;Other (Comment) (ministry of presence)  Stress Factors  Patient Stress Factors Health changes  Family Stress Factors Health changes  Responded to stemi code, was not able to see patient but offered a ministry of presence with wife, there was a major artery heart attack, patient was working outside at home carrying wood, pushing and lifting wheel barrel, pt. Is stable, was in lots of pain, left side pain,will be going to 6th floor ICU later. Other family members will be coming, daughter Corky Sox, wife already present, was able to talk to wife and calm.  Did not want a prayer but to talk and remain hopeful.   [employer] [phone]

## 2016-08-23 ENCOUNTER — Encounter (HOSPITAL_COMMUNITY): Payer: Self-pay | Admitting: Cardiovascular Disease

## 2016-08-23 DIAGNOSIS — E785 Hyperlipidemia, unspecified: Secondary | ICD-10-CM

## 2016-08-23 DIAGNOSIS — E876 Hypokalemia: Secondary | ICD-10-CM

## 2016-08-23 LAB — LIPID PANEL
Cholesterol: 175 mg/dL (ref 0–200)
HDL: 59 mg/dL (ref >40)
LDL CALC: 108 mg/dL — AB (ref 0–99)
Total CHOL/HDL Ratio: 3 RATIO
Triglycerides: 42 mg/dL (ref <150)
VLDL: 8 mg/dL (ref 0–40)

## 2016-08-23 LAB — BASIC METABOLIC PANEL
ANION GAP: 7 (ref 5–15)
BUN: 10 mg/dL (ref 6–20)
CO2: 23 mmol/L (ref 22–32)
Calcium: 8.3 mg/dL — ABNORMAL LOW (ref 8.9–10.3)
Chloride: 109 mmol/L (ref 101–111)
Creatinine, Ser: 0.77 mg/dL (ref 0.61–1.24)
GFR calc Af Amer: >60 mL/min (ref >60)
GFR calc non Af Amer: >60 mL/min (ref >60)
GLUCOSE: 105 mg/dL — AB (ref 65–99)
POTASSIUM: 4.1 mmol/L (ref 3.5–5.1)
Sodium: 139 mmol/L (ref 135–145)

## 2016-08-23 LAB — CBC
HEMATOCRIT: 36.4 % — AB (ref 39.0–52.0)
HEMOGLOBIN: 12.6 g/dL — AB (ref 13.0–17.0)
MCH: 31.4 pg (ref 26.0–34.0)
MCHC: 34.6 g/dL (ref 30.0–36.0)
MCV: 90.8 fL (ref 78.0–100.0)
Platelets: 180 10*3/uL (ref 150–400)
RBC: 4.01 MIL/uL — ABNORMAL LOW (ref 4.22–5.81)
RDW: 12.7 % (ref 11.5–15.5)
WBC: 5.8 10*3/uL (ref 4.0–10.5)

## 2016-08-23 LAB — TROPONIN I
TROPONIN I: 5.94 ng/mL — AB (ref <0.03)
Troponin I: 7.34 ng/mL (ref <0.03)

## 2016-08-23 LAB — BRAIN NATRIURETIC PEPTIDE: B Natriuretic Peptide: 164 pg/mL — ABNORMAL HIGH (ref 0.0–100.0)

## 2016-08-23 MED ORDER — ATROPINE SULFATE 1 MG/10ML IJ SOSY
PREFILLED_SYRINGE | INTRAMUSCULAR | Status: AC
  Filled 2016-08-23: qty 10

## 2016-08-23 MED ORDER — LISINOPRIL 2.5 MG PO TABS
2.5000 mg | ORAL_TABLET | Freq: Every day | ORAL | Status: DC
  Administered 2016-08-23 – 2016-08-25 (×3): 2.5 mg via ORAL
  Filled 2016-08-23 (×3): qty 1

## 2016-08-23 MED ORDER — ATROPINE SULFATE 1 MG/10ML IJ SOSY
PREFILLED_SYRINGE | INTRAMUSCULAR | Status: AC
  Administered 2016-08-23: 1 mg
  Filled 2016-08-23: qty 10

## 2016-08-23 MED FILL — Heparin Sodium (Porcine) Inj 1000 Unit/ML: INTRAMUSCULAR | Qty: 10 | Status: AC

## 2016-08-23 MED FILL — Heparin Sodium (Porcine) 2 Unit/ML in Sodium Chloride 0.9%: INTRAMUSCULAR | Qty: 500 | Status: AC

## 2016-08-23 MED FILL — Fentanyl Citrate Preservative Free (PF) Inj 100 MCG/2ML: INTRAMUSCULAR | Qty: 2 | Status: AC

## 2016-08-23 NOTE — Care Management Note (Signed)
Case Management Note  Patient Details  Name: Timothy Escobar MRN: [id number] Date of Birth: October 02, 1941  Subjective/Objective:   STEMI, chest pain, post PCI                 Action/Plan: Discharge Planning:   NCM spoke to pt. Lives at home with wife. Provided pt with Brilinta 30 day free trial card. Will check benefits for Brilinta. Contacted pt's pharmacy [facility] and they do have medication in stock.   S/W J. J @ [facility] # [phone]   BRILINTA 90 MG BID 60 TAB   COVER- YES  CO-PAY- $ 45.00   Q/L 2 PER DAYS  TIER- E3 DRUG  PRIOR APPROVAL- NO  [facility] : [facility]  Expected Discharge Date:                Expected Discharge Plan:  Home/Self Care  In-House Referral:  NA  Discharge planning Services  CM Consult, Medication Assistance  Post Acute Care Choice:  NA Choice offered to:  NA  DME Arranged:  N/A DME Agency:  NA  HH Arranged:  NA HH Agency:  NA  Status of Service:  Completed, signed off  If discussed at Long Length of Stay Meetings, dates discussed:    Additional Comments:  Erenest Rasher, RN 08/23/2016, 1:09 PM

## 2016-08-23 NOTE — Progress Notes (Signed)
Pt pressed call bell post BM due to feeling clammy, diaphoretic. Upon assessment pt also complaining of right groin pain, pt does have a level 1 bruising and small hematoma. Manual pressure held. Pt transferred back to bed. Administered Atropine for HR 20-30s, gave 250 NS bolus. Pt HR 80s. Pt feeling better. Right groin site dressed with pressure dressing. Pt feeling better and no longer diaphoretic. RN will continue to monitor.

## 2016-08-23 NOTE — Progress Notes (Signed)
ANTICOAGULATION CONSULT NOTE -Follow-Up Consult   Pharmacy Consult for Aggrastat x18hrs Indication: post cardiac cath  No Known Allergies  Patient Measurements: Height: 5\' 8"  (172.7 cm) Weight: 175 lb 14.8 oz (79.8 kg) IBW/kg (Calculated) : 68.4  Vital Signs: Temp: 97.8 F (36.6 C) (11/01 0739) Temp Source: Oral (11/01 0739) BP: 123/66 (11/01 0956) Pulse Rate: 61 (11/01 0900)  Labs:  Recent Labs  08/22/16 1754 08/22/16 2030 08/23/16 0200  HGB 14.5  --  12.6*  HCT 41.0  --  36.4*  PLT 225  --  180  APTT 28  --   --   LABPROT 13.4  --   --   INR 1.02  --   --   CREATININE 1.02  --  0.77  TROPONINI <0.03 1.65* 7.34*    Estimated Creatinine Clearance: 79.6 mL/min (by C-G formula based on SCr of 0.77 mg/dL).   Medical History: Past Medical History:  Diagnosis Date  . Blood transfusion without reported diagnosis   . Cancer ([place])    skin cancer  . Depression    Assessment: 76 year old male s/p code stemi now s/p cath and PCI to LAD treated with DES stent on aggrastat for 18 hours. Aggrastat is due to stop today at 1400. CBC is down slightly from admission but platelets are stable. No signs of bleeding noted or observed.   Goal of Therapy:  Monitor platelets by anticoagulation protocol: Yes   Plan:  Continue Aggrastat at 0.15 mcg/kg/min for 18 hours, ending 11/1 ~ 1400 Pharmacy will sign off.   Uvaldo Bristle, PharmD PGY1 Pharmacy Resident  Pager: [phone] 08/23/2016,10:33 AM

## 2016-08-23 NOTE — Progress Notes (Signed)
CRITICAL VALUE ALERT  Critical value received:  Troponin 1.65  Date of notification:  08/22/2016  Time of notification:  2128  Critical value read back:Yes.    Nurse who received alert:  Hadassah Pais RN  MD notified (1st page):  Expected value pt STEMI

## 2016-08-23 NOTE — Progress Notes (Signed)
Subjective:  No chest pain or dyspnea  Objective:   Vital Signs : Vitals:   08/23/16 0600 08/23/16 0700 08/23/16 0739 08/23/16 0800  BP: 136/60 130/63 132/68 130/61  Pulse: (!) 54 (!) 54 (!) 55 (!) 53  Resp: '12 15  17  '$ Temp:   97.8 F (36.6 C)   TempSrc:   Oral   SpO2: 97% 94% 97% 95%  Weight: 175 lb 14.8 oz (79.8 kg)     Height:        Intake/Output from previous day:  Intake/Output Summary (Last 24 hours) at 08/23/16 0841 Last data filed at 08/23/16 0800  Gross per 24 hour  Intake          1643.47 ml  Output             2250 ml  Net          -606.53 ml    I/O since admission: -606  Wt Readings from Last 3 Encounters:  08/23/16 175 lb 14.8 oz (79.8 kg)  06/08/16 178 lb 3.2 oz (80.8 kg)  10/07/15 175 lb (79.4 kg)    Medications: . aspirin EC  81 mg Oral Daily  . atorvastatin  80 mg Oral q1800  . sodium chloride flush  3 mL Intravenous Q12H  . ticagrelor  90 mg Oral BID    . sodium chloride 10 mL/hr at 08/23/16 0800  . tirofiban 0.15 mcg/kg/min (08/23/16 0800)    Physical Exam:   General appearance: alert, cooperative and no distress Neck: no adenopathy, no carotid bruit, no JVD, supple, symmetrical, trachea midline and thyroid not enlarged, symmetric, no tenderness/mass/nodules Lungs: clear to auscultation bilaterally Heart: RRR 1/6 sem at LLSB; no s3; no gallop Abdomen: soft, non-tender; bowel sounds normal; no masses,  no organomegaly Extremities: no edema, redness or tenderness in the calves or thighs Pulses: 2+ and symmetric Skin: Skin color, texture, turgor normal. No rashes or lesions Neurologic: Grossly normal   Rate: 54-65  Rhythm: normal sinus rhythm  08/23/16 ECG (independently read by me): SB at 55;  Normal ECG; Complete resolution of ST changes  08/22/16 Initial ECG by EMS (independently read by me):  NSR at 64;  11 mm STE V3-V6 c/w acute anterior injury  Lab Results:   Recent Labs  08/22/16 1754 08/23/16 0200  NA 140 139  K  2.8* 4.1  CL 109 109  CO2 15* 23  GLUCOSE 163* 105*  BUN 17 10  CREATININE 1.02 0.77  CALCIUM 9.3 8.3*  MG 2.0  --     Hepatic Function Latest Ref Rng & Units 08/22/2016 10/07/2015 10/07/2014  Total Protein 6.5 - 8.1 g/dL 6.3(L) 6.6 6.4  Albumin 3.5 - 5.0 g/dL 4.0 4.1 4.2  AST 15 - 41 U/L '31 23 26  '$ ALT 17 - 63 U/L '22 21 22  '$ Alk Phosphatase 38 - 126 U/L 73 74 65  Total Bilirubin 0.3 - 1.2 mg/dL 0.5 0.7 0.6     Recent Labs  08/22/16 1754 08/23/16 0200  WBC 7.0 5.8  NEUTROABS 3.1  --   HGB 14.5 12.6*  HCT 41.0 36.4*  MCV 88.0 90.8  PLT 225 180     Recent Labs  08/22/16 1754 08/22/16 2030 08/23/16 0200  TROPONINI <0.03 1.65* 7.34*    Lab Results  Component Value Date   TSH 1.447 08/22/2016   No results for input(s): HGBA1C in the last 72 hours.   Recent Labs  08/22/16 1754  PROT 6.3*  ALBUMIN 4.0  AST 31  ALT 22  ALKPHOS 73  BILITOT 0.5    Recent Labs  08/22/16 1754  INR 1.02   BNP (last 3 results)  Recent Labs  08/23/16 0200  BNP 164.0*    ProBNP (last 3 results) No results for input(s): PROBNP in the last 8760 hours.   Lipid Panel     Component Value Date/Time   CHOL 175 08/23/2016 0200   TRIG 42 08/23/2016 0200   HDL 59 08/23/2016 0200   CHOLHDL 3.0 08/23/2016 0200   VLDL 8 08/23/2016 0200   LDLCALC 108 (H) 08/23/2016 0200      Imaging:  Dg Chest Port 1 View  Result Date: 08/22/2016 CLINICAL DATA:  Recent STEMI EXAM: PORTABLE CHEST 1 VIEW COMPARISON:  None. FINDINGS: Cardiac shadow is within normal limits. The lungs are well aerated bilaterally. No focal infiltrate or sizable effusion is seen. No acute bony abnormality is noted. IMPRESSION: No acute abnormality seen. Electronically Signed   By: Inez Catalina M.D.   On: 08/22/2016 21:54      Assessment/Plan:   Principal Problem:   Acute ST elevation myocardial infarction (STEMI) involving left anterior descending (LAD) coronary artery ([place])   1. STEMI; day 1 s/p  anterior STEMI with total LAD occlusion s/p tandem DES stent to LAD with very early reperfusion (DBT 19 minutes, and ~ 1 hour from chest pain onset) and high liklihood of complete myocardial salvage.  Initial thrombus burden currently on aggrastat for 18 hrs. Now on DAPT.  No CHF on exam; BNP 164. Troponin increased to 7.34  2. Hypokalemia:  2.8  Improved to 4.1  3. Hyperlipidemia: LDL 108; now on atorvastatin 80 mg.   Will dc aggrastat.  Start low dose lisinopril at 2.5 mg daily.  Monitor HR and potentially start carvedilol 3.125 bid if no significant bradycardia.  For echo today.  Cardiac rehab. Spent significant time with patient reviewing MI causes and event, cath data, treatment strategy and expectations.  Time spent: 35 minutes Troy Sine, MD, [facility] 08/23/2016, 8:41 AM

## 2016-08-23 NOTE — Progress Notes (Signed)
CARDIAC REHAB PHASE I   PRE:  Rate/Rhythm: 67 SR  BP:  Supine: 119/58  Sitting: 129/73  Standing:    SaO2: 96%RA  MODE:  Ambulation: 350 ft   POST:  Rate/Rhythm: 71 SR  BP:  Supine:   Sitting: 148/64  Standing:    SaO2: 98%RA 1310-1420 Pt walked 350 ft on RA with steady gait. Tolerated well. No CP. MI education completed with pt who voiced understanding. Stressed importance of brilinta with stent. He has brilinta booklet. Reviewed NTG use, MI restrictions, risk factors, ex ed and heart healthy diet. Discussed CRP 2 and will refer to [name] program. To recliner with call bell.    Graylon Good, RN BSN  08/23/2016 2:18 PM

## 2016-08-23 NOTE — Progress Notes (Signed)
pts right groin sheath pulled at 0305. Pressure held for 25 mins. Pt vitals stable without complications. Pt instructed to apply pressure when coughing laughing or sneezing. Pt tolerated well. Will continue to monitor.

## 2016-08-24 ENCOUNTER — Inpatient Hospital Stay (HOSPITAL_COMMUNITY): Payer: Medicare Other

## 2016-08-24 DIAGNOSIS — R001 Bradycardia, unspecified: Secondary | ICD-10-CM

## 2016-08-24 DIAGNOSIS — I2109 ST elevation (STEMI) myocardial infarction involving other coronary artery of anterior wall: Secondary | ICD-10-CM

## 2016-08-24 DIAGNOSIS — E785 Hyperlipidemia, unspecified: Secondary | ICD-10-CM

## 2016-08-24 DIAGNOSIS — Z955 Presence of coronary angioplasty implant and graft: Secondary | ICD-10-CM

## 2016-08-24 DIAGNOSIS — I2102 ST elevation (STEMI) myocardial infarction involving left anterior descending coronary artery: Principal | ICD-10-CM

## 2016-08-24 LAB — BASIC METABOLIC PANEL
ANION GAP: 6 (ref 5–15)
BUN: 13 mg/dL (ref 6–20)
CHLORIDE: 111 mmol/L (ref 101–111)
CO2: 24 mmol/L (ref 22–32)
Calcium: 8.6 mg/dL — ABNORMAL LOW (ref 8.9–10.3)
Creatinine, Ser: 0.84 mg/dL (ref 0.61–1.24)
GFR calc Af Amer: >60 mL/min (ref >60)
GFR calc non Af Amer: >60 mL/min (ref >60)
Glucose, Bld: 99 mg/dL (ref 65–99)
POTASSIUM: 4.4 mmol/L (ref 3.5–5.1)
SODIUM: 141 mmol/L (ref 135–145)

## 2016-08-24 LAB — CBC
HEMATOCRIT: 39.3 % (ref 39.0–52.0)
HEMOGLOBIN: 13.2 g/dL (ref 13.0–17.0)
MCH: 31.4 pg (ref 26.0–34.0)
MCHC: 33.6 g/dL (ref 30.0–36.0)
MCV: 93.6 fL (ref 78.0–100.0)
Platelets: 172 10*3/uL (ref 150–400)
RBC: 4.2 MIL/uL — AB (ref 4.22–5.81)
RDW: 13.1 % (ref 11.5–15.5)
WBC: 7.5 10*3/uL (ref 4.0–10.5)

## 2016-08-24 LAB — ECHOCARDIOGRAM COMPLETE
Height: 68 in
Weight: 2818.36 oz

## 2016-08-24 MED ORDER — FLUOXETINE HCL 20 MG PO CAPS
20.0000 mg | ORAL_CAPSULE | Freq: Every day | ORAL | Status: DC
  Administered 2016-08-24 – 2016-08-25 (×2): 20 mg via ORAL
  Filled 2016-08-24 (×2): qty 1

## 2016-08-24 NOTE — Progress Notes (Signed)
Patient Name: Timothy Escobar Date of Encounter: 08/24/2016   Hospital Problem List     Principal Problem:   Acute ST elevation myocardial infarction (STEMI) involving left anterior descending (LAD) coronary artery (HCC) Active Problems:   Hypokalemia   Hyperlipidemia with target LDL less than 70     Subjective   Did well working with cardiac rehab yesterday. Able to walk 350 ft on room air. No chest pain or shortness of breath. Did have an episode yesterday afternoon. Had a BM and became clammy and diaphoretic. HR dropped into the 20-30s per nursing notes. Received Atropine with improvement to the 80s and resolution of symptoms. Never had any chest pain or shortness of breath. Does have continued pain and soreness in his right groin from the catheter insertion site. No bleeding from the site. Pressure dressing in place.   Inpatient Medications    . aspirin EC  81 mg Oral Daily  . atorvastatin  80 mg Oral q1800  . lisinopril  2.5 mg Oral Daily  . sodium chloride flush  3 mL Intravenous Q12H  . ticagrelor  90 mg Oral BID    Vital Signs    Vitals:   08/24/16 0700 08/24/16 0800 08/24/16 0900 08/24/16 0928  BP: 137/74 124/76 139/73 139/73  Pulse: (!) 49 (!) 50 60   Resp: 15 19 (!) 22   Temp:   97.1 F (36.2 C)   TempSrc:   Oral   SpO2: 94% 94% 95%   Weight:      Height:        Intake/Output Summary (Last 24 hours) at 08/24/16 1020 Last data filed at 08/24/16 0800  Gross per 24 hour  Intake            563.2 ml  Output             2525 ml  Net          -1961.8 ml   Filed Weights   08/22/16 1915 08/23/16 0600 08/24/16 0600  Weight: 176 lb 9.4 oz (80.1 kg) 175 lb 14.8 oz (79.8 kg) 176 lb 2.4 oz (79.9 kg)    Physical Exam   General appearance: alert, cooperative and no distress Neck: no adenopathy, no carotid bruit, no JVD, supple, symmetrical, trachea midline and thyroid not enlarged, symmetric, no tenderness/mass/nodules Lungs: clear to auscultation  bilaterally Heart: RRR 1/6 sem at LLSB; no s3; no gallop Abdomen: soft, non-tender; bowel sounds normal; no masses,  no organomegaly Extremities: no edema, redness or tenderness in the calves or thighs Pulses: 2+ and symmetric Skin: Skin color, texture, turgor normal. No rashes or lesions Neurologic: Grossly normal  Labs    CBC  Recent Labs  08/22/16 1754 08/23/16 0200 08/24/16 0257  WBC 7.0 5.8 7.5  NEUTROABS 3.1  --   --   HGB 14.5 12.6* 13.2  HCT 41.0 36.4* 39.3  MCV 88.0 90.8 93.6  PLT 225 180 [id number]   Basic Metabolic Panel  Recent Labs  08/22/16 1754 08/23/16 0200 08/24/16 0257  NA 140 139 141  K 2.8* 4.1 4.4  CL 109 109 111  CO2 15* 23 24  GLUCOSE 163* 105* 99  BUN 17 10 13   CREATININE 1.02 0.77 0.84  CALCIUM 9.3 8.3* 8.6*  MG 2.0  --   --    Liver Function Tests  Recent Labs  08/22/16 1754  AST 31  ALT 22  ALKPHOS 73  BILITOT 0.5  PROT 6.3*  ALBUMIN 4.0   Cardiac  Enzymes  Recent Labs  08/22/16 2030 08/23/16 0200 08/23/16 1130  TROPONINI 1.65* 7.34* 5.94*   Fasting Lipid Panel  Recent Labs  08/23/16 0200  CHOL 175  HDL 59  LDLCALC 108*  TRIG 42  CHOLHDL 3.0   Thyroid Function Tests  Recent Labs  08/22/16 1754  TSH 1.447    Telemetry    NSR  ECG    Sinus bradycardia   Radiology    Dg Chest Port 1 View  Result Date: 08/22/2016 CLINICAL DATA:  Recent STEMI EXAM: PORTABLE CHEST 1 VIEW COMPARISON:  None. FINDINGS: Cardiac shadow is within normal limits. The lungs are well aerated bilaterally. No focal infiltrate or sizable effusion is seen. No acute bony abnormality is noted. IMPRESSION: No acute abnormality seen. Electronically Signed   By: Inez Catalina M.D.   On: 08/22/2016 21:54    Patient Profile     Patient presented on 10/31 with acute onset chest pain and found to have an aanterior STEMI with total LAD occlusion s/p tandem DES stent to LAD with very early reperfusion (DBT 19 minutes, and ~ 1 hour from chest pain  onset).   Assessment & Plan    1. STEMI; day 2 s/p anterior STEMI with total LAD occlusion s/p tandem DES stent to LAD with very early reperfusion (DBT 19 minutes, and ~ 1 hour from chest pain onset) and high liklihood of complete myocardial salvage.  Initial thrombus burden and was treated with aggrastat for 18 hrs. Now only on DAPT. Troponin down trending to 5.94 from peak of 7.34. EKG with complete resolution of ST elevations. Doing well symptomatically. Started on Lisinopril 2.5 mg yesterday as well as atorvastatin. Unable to initiate BB therapy as patient's HR has been in the 50-60s since admission. Doing well and can transfer out of CCU to the step-down unit today.   2. Bradycardia - episode of bradycardia after bearing down to have a BM. Became diaphoretic and with HR in th 20-30s. Got atropine. Symptoms resolved and HR improved to the 80s. Suspect vasovagal response. HR has been in the 50-60s. Unable to initiate BB therapy at this time.   3. Hypokalemia:  Resolved  4. Hyperlipidemia: LDL 108; now on atorvastatin 80 mg.   Anson Crofts PGY2 IM Resident [phone] 08/24/2016, 10:20 AM    Patient seen and examined. Agree with assessment and plan. Feels well; no recurrent chest pain. Bradycaric in the 50's, will not start BB. Tolerating initiation of low dose ACE-I.  Echo not yet done. Will transfer to stepdown.  If remains stable ? Dc tomorrow.   Troy Sine, MD, [facility] 08/24/2016 12:26 PM

## 2016-08-24 NOTE — Progress Notes (Signed)
CARDIAC REHAB PHASE I   PRE:  Rate/Rhythm: 61 SR  BP:  Supine: 132/72  Sitting:   Standing:    SaO2: 98%RA  MODE:  Ambulation: 700 ft   POST:  Rate/Rhythm: 70 SR  BP:  Supine:   Sitting: 141/79  Standing:    SaO2: 98%RA 1105-1140 Pt walked 700 ft with steady gait. Felt well. No CP. To recliner after walk. Attached back to [place] as he was on prior to walk and RN stated ok to disconnect for walk. Wife in room. Call bell in reach. Heart rate to 55 with rest.   Graylon Good, RN BSN  08/24/2016 11:36 AM

## 2016-08-24 NOTE — Progress Notes (Signed)
*  PRELIMINARY RESULTS* Echocardiogram 2D Echocardiogram has been performed.  Timothy Escobar 08/24/2016, 3:00 PM

## 2016-08-25 ENCOUNTER — Telehealth: Payer: Self-pay | Admitting: Cardiovascular Disease

## 2016-08-25 LAB — HEMOGLOBIN A1C
HEMOGLOBIN A1C: 5.5 % (ref 4.8–5.6)
MEAN PLASMA GLUCOSE: 111 mg/dL

## 2016-08-25 MED ORDER — LISINOPRIL 2.5 MG PO TABS: 2.5000 mg | ORAL_TABLET | Freq: Every day | ORAL | 2 refills | Status: DC

## 2016-08-25 MED ORDER — ATORVASTATIN CALCIUM 80 MG PO TABS: 80.0000 mg | ORAL_TABLET | Freq: Every day | ORAL | 2 refills | Status: DC

## 2016-08-25 MED ORDER — TICAGRELOR 90 MG PO TABS: 90.0000 mg | ORAL_TABLET | Freq: Two times a day (BID) | ORAL | 2 refills | Status: DC

## 2016-08-25 MED ORDER — NITROGLYCERIN 0.4 MG SL SUBL: 0.4000 mg | SUBLINGUAL_TABLET | SUBLINGUAL | 12 refills | Status: DC | PRN

## 2016-08-25 MED ORDER — ASPIRIN 81 MG PO TBEC: 81.0000 mg | DELAYED_RELEASE_TABLET | Freq: Every day | ORAL | 2 refills | Status: AC

## 2016-08-25 NOTE — Progress Notes (Signed)
Patient Name: Timothy Escobar Date of Encounter: 08/25/2016  Hospital Problem List     Principal Problem:   Acute ST elevation myocardial infarction (STEMI) involving left anterior descending (LAD) coronary artery (HCC) Active Problems:   Hypokalemia   Hyperlipidemia with target LDL less than 70   Status post coronary artery stent placement   Bradycardia    Subjective   Doing well this morning. No chest pain or shortness of breath. Doing well with PT.  Inpatient Medications    . aspirin EC  81 mg Oral Daily  . atorvastatin  80 mg Oral q1800  . FLUoxetine  20 mg Oral Daily  . lisinopril  2.5 mg Oral Daily  . sodium chloride flush  3 mL Intravenous Q12H  . ticagrelor  90 mg Oral BID    Vital Signs    Vitals:   08/25/16 0338 08/25/16 0400 08/25/16 0736 08/25/16 0800  BP:  123/67 131/76 117/72  Pulse: (!) 58 (!) 57 (!) 59 63  Resp: 17 17 11 15   Temp: 98 F (36.7 C)  98.1 F (36.7 C)   TempSrc: Oral  Oral   SpO2: 94% 94% 95% 94%  Weight: 175 lb 4.3 oz (79.5 kg)     Height:        Intake/Output Summary (Last 24 hours) at 08/25/16 1025 Last data filed at 08/25/16 0000  Gross per 24 hour  Intake              760 ml  Output             1350 ml  Net             -590 ml   Filed Weights   08/23/16 0600 08/24/16 0600 08/25/16 0338  Weight: 175 lb 14.8 oz (79.8 kg) 176 lb 2.4 oz (79.9 kg) 175 lb 4.3 oz (79.5 kg)    Physical Exam   General appearance: alert, cooperative and no distress Neck: no adenopathy, no carotid bruit, no JVD, supple, symmetrical, trachea midline and thyroid not enlarged, symmetric, no tenderness/mass/nodules Lungs: clear to auscultation bilaterally Heart: RRR 1/6 sem at LLSB; no s3; no gallop Abdomen: soft, non-tender; bowel sounds normal; no masses, no organomegaly Extremities: no edema, redness or tenderness in the calves or thighs Pulses: 2+ and symmetric Skin: Skin color, texture, turgor normal. No rashes or lesions Neurologic:  Grossly normal  Labs    CBC  Recent Labs  08/22/16 1754 08/23/16 0200 08/24/16 0257  WBC 7.0 5.8 7.5  NEUTROABS 3.1  --   --   HGB 14.5 12.6* 13.2  HCT 41.0 36.4* 39.3  MCV 88.0 90.8 93.6  PLT 225 180 [id number]   Basic Metabolic Panel  Recent Labs  08/22/16 1754 08/23/16 0200 08/24/16 0257  NA 140 139 141  K 2.8* 4.1 4.4  CL 109 109 111  CO2 15* 23 24  GLUCOSE 163* 105* 99  BUN 17 10 13   CREATININE 1.02 0.77 0.84  CALCIUM 9.3 8.3* 8.6*  MG 2.0  --   --    Liver Function Tests  Recent Labs  08/22/16 1754  AST 31  ALT 22  ALKPHOS 73  BILITOT 0.5  PROT 6.3*  ALBUMIN 4.0   No results for input(s): LIPASE, AMYLASE in the last 72 hours. Cardiac Enzymes  Recent Labs  08/22/16 2030 08/23/16 0200 08/23/16 1130  TROPONINI 1.65* 7.34* 5.94*   BNP Invalid input(s): POCBNP D-Dimer No results for input(s): DDIMER in the last 72 hours. Hemoglobin  A1C  Recent Labs  08/23/16 0200  HGBA1C 5.5   Fasting Lipid Panel  Recent Labs  08/23/16 0200  CHOL 175  HDL 59  LDLCALC 108*  TRIG 42  CHOLHDL 3.0   Thyroid Function Tests  Recent Labs  08/22/16 1754  TSH 1.447     ECG    NSR  Radiology    No results found.  Patient Profile     Patient presented on 10/31 with acute onset chest pain and found to have an aanterior STEMI with total LAD occlusion s/p tandem DES stent to LAD with very early reperfusion (DBT 19 minutes, and ~ 1 hour from chest pain onset).   Assessment & Plan    1. STEMI; day 3 s/p anterior STEMI with total LAD occlusion s/p tandem DES stent to LAD with very early reperfusion (DBT 19 minutes, and ~ 1 hour from chest pain onset) and high liklihood of complete myocardial salvage. Doing well. Had ECHO done yesterday that shows EF 60-65%, no wall motion abnormalities or diastolic dysfunction. Started on low dose lisinopril 2.5 mg daily and high intensity statin, atorvastatin 80 mg daily. Have been unable to initiate BB therapy as  patients HR has been in the 50-60. Doing well and will be ready for discharge today. Close follow up in 1-2 weeks.  2. Bradycardia - episode of bradycardia after bearing down to have a BM on 11/1. Became diaphoretic and with HR in th 20-30s. Got atropine. Symptoms resolved and HR improved to the 80s. Suspect vasovagal response. HR has been in the 50-60s. Unable to initiate BB therapy at this time.   3. Hypokalemia: Resolved  4. Hyperlipidemia: LDL 108; now on atorvastatin 80 mg.   Anson Crofts PGY2 IM Resident [phone] 08/25/2016, 10:25 AM   Patient seen and examined. Agree with assessment and plan. Continues do exceptionally well. ECHO is entirely normal, with Nl EF and wall motion suggesting complete salvage of myocardium. Tolerating very low dose lisinopril post MI. BB not started due to bradycardia, but possibly can be started as outpatient, but he states HR at home is typically in low 50s.  OK for dc today. Will follow in office.    Troy Sine, MD, [facility] 08/25/2016 3:56 PM

## 2016-08-25 NOTE — Telephone Encounter (Signed)
PATIENT CURRENTLY ADMITTED TO HOSPITAL 08/25/16

## 2016-08-25 NOTE — Discharge Summary (Signed)
Discharge Summary    Patient ID: Timothy Escobar,  MRN: [id number], DOB/AGE: February 07, 1941 75 y.o.  Admit date: 08/22/2016 Discharge date: 08/25/2016  Primary Care Provider: [facility] Primary Cardiologist: Dr. Claiborne Billings  Discharge Diagnoses    Principal Problem:   Acute ST elevation myocardial infarction (STEMI) involving left anterior descending (LAD) coronary artery [facility]) Active Problems:   Hypokalemia   Hyperlipidemia with target LDL less than 70   Status post coronary artery stent placement   Bradycardia   Allergies No Known Allergies  Diagnostic Studies/Procedures    Left Heart Cath and Coronary Angiography  Conclusion     Ost LAD lesion, 30 %stenosed.  Prox LAD-1 lesion, 30 %stenosed.  Mid LAD-1 lesion, 70 %stenosed.  A STENT SYNERGY DES 3X16 drug eluting stent was successfully placed, and does not overlap previously placed stent.  Prox LAD-2 lesion, 100 %stenosed.  Post intervention, there is a 0% residual stenosis.  A STENT SYNERGY DES 2.75X24 drug eluting stent was successfully placed, and overlaps previously placed stent.  Mid LAD-2 lesion, 80 %stenosed.  Post intervention, there is a 0% residual stenosis.   Acute ST segment elevation anterior wall myocardial infarction with 11 mm of ST segment elevation in leads V3 through V6 due to total occlusion of the proximal LAD.  Large dominant normal left circumflex vessel and small nondominant RCA.  Successful PCI to the LAD, treated with PTCA/DES stenting at the site of 100% occlusion with initial insertion of a 3.016 mm Synergy DES stent.  Once  TIMI-3 flow was established it was evident that there was segmental disease beyond the stented segment of 70 and 80% and tandem stenting with a 2.7524 mm Synergy DES stent was done with post stent dilatation from 3.23 mm in the proximal portion of the proximal stent to 3.05 mm at the distal portion of the distal stent.   LV EF: 60% -    65%  ------------------------------------------------------------------- History:   PMH:  Bradycardia. Acute Myocardial Infarction.  PMH: Myocardial infarction.  Myocardial infarction.  Risk factors: Dyslipidemia.  ------------------------------------------------------------------- Study Conclusions  - Left ventricle: The cavity size was normal. Systolic function was   normal. The estimated ejection fraction was in the range of 60%   to 65%. Wall motion was normal; there were no regional wall   motion abnormalities. Left ventricular diastolic function   parameters were normal. _____________   History of Present Illness     Timothy Escobar is a 75 y.o. male with a history of skin CA. No hx HTN, DM, HLD. +FH CAD in his sister.  Today, pt moving wood using a wheelbarrow. He had sudden onset of chest pressure at about 5:00 pm. Initially thought it was indigestion, but then developed L arm tingling and slight SOB, fingers cramping. Sx worsened, EMS called. His ECG was c/w anterior STEMI. He was given ASA by his wife, EMS gave him SL NTG, but he was still having severe pain and abnormal ECG on arrival. Pt taken emergently to the cath lab.  Never had anything like this before. No hx DOE, decreased exertion due to fatigue, never had chest pain before.   Hospital Course   STEMI;  STEMI with total LAD occlusion and underwent tandem DES stent to LAD with very early reperfusion (DBT 19 minutes, and ~ 1 hour from chest pain onset) and high liklihood of complete myocardial salvage. Had ECHO done that showed EF 60-65%, no wall motion abnormalities or diastolic dysfunction. Started on low dose lisinopril 2.5  mg daily and high intensity statin, atorvastatin 80 mg daily. Unable to initiate BB therapy as patients HRs in the 50-60. Close follow up in 1-2 weeks.  Bradycardia - episode of bradycardia after bearing down to have a BM on 11/1. Became diaphoretic and with HR in th 20-30s. Got atropine.  Symptoms resolved and HR improved to the 80s. Suspect vasovagal response. HR in the 50-60s. Unable to initiate BB therapy.  Hyperlipidemia: LDL 108; now on atorvastatin 80 mg.  _____________  Discharge Vitals Blood pressure 123/72, pulse 69, temperature 98.6 F (37 C), temperature source Oral, resp. rate 17, height 5\' 8"  (1.727 m), weight 175 lb 4.3 oz (79.5 kg), SpO2 94 %.  Filed Weights   08/23/16 0600 08/24/16 0600 08/25/16 0338  Weight: 175 lb 14.8 oz (79.8 kg) 176 lb 2.4 oz (79.9 kg) 175 lb 4.3 oz (79.5 kg)    Labs & Radiologic Studies     CBC No results for input(s): WBC, NEUTROABS, HGB, HCT, MCV, PLT in the last 72 hours. Basic Metabolic Panel No results for input(s): NA, K, CL, CO2, GLUCOSE, BUN, CREATININE, CALCIUM, MG, PHOS in the last 72 hours. Liver Function Tests No results for input(s): AST, ALT, ALKPHOS, BILITOT, PROT, ALBUMIN in the last 72 hours. No results for input(s): LIPASE, AMYLASE in the last 72 hours. Cardiac Enzymes No results for input(s): CKTOTAL, CKMB, CKMBINDEX, TROPONINI in the last 72 hours. BNP Invalid input(s): POCBNP D-Dimer No results for input(s): DDIMER in the last 72 hours. Hemoglobin A1C No results for input(s): HGBA1C in the last 72 hours. Fasting Lipid Panel No results for input(s): CHOL, HDL, LDLCALC, TRIG, CHOLHDL, LDLDIRECT in the last 72 hours. Thyroid Function Tests No results for input(s): TSH, T4TOTAL, T3FREE, THYROIDAB in the last 72 hours.  Invalid input(s): FREET3  Dg Chest Port 1 View  Result Date: 08/22/2016 CLINICAL DATA:  Recent STEMI EXAM: PORTABLE CHEST 1 VIEW COMPARISON:  None. FINDINGS: Cardiac shadow is within normal limits. The lungs are well aerated bilaterally. No focal infiltrate or sizable effusion is seen. No acute bony abnormality is noted. IMPRESSION: No acute abnormality seen. Electronically Signed   By: Inez Catalina M.D.   On: 08/22/2016 21:54    Disposition   Pt is being discharged home today in good  condition.  Follow-up Plans & Appointments    Follow-up Information    Murray Hodgkins, NP Follow up on 08/29/2016.   Specialties:  Nurse Practitioner, Cardiology, Radiology Why:  at 10:30am for hospital follow up.  Contact information: [address] [place] [address] [phone]          Discharge Instructions    AMB Referral to Cardiac Rehabilitation - Phase II    Complete by:  As directed    Diagnosis:  STEMI   AMB Referral to Phase II Cardiac Rehab    Complete by:  As directed    Diagnosis:  STEMI   Amb Referral to Cardiac Rehabilitation    Complete by:  As directed    Diagnosis:   STEMI Coronary Stents     Diet - low sodium heart healthy    Complete by:  As directed    Diet - low sodium heart healthy    Complete by:  As directed    Discharge instructions    Complete by:  As directed    Groin Site Care Refer to this sheet in the next few weeks. These instructions provide you with information on caring for yourself after your procedure. Your  caregiver may also give you more specific instructions. Your treatment has been planned according to current medical practices, but problems sometimes occur. Call your caregiver if you have any problems or questions after your procedure. HOME CARE INSTRUCTIONS You may shower 24 hours after the procedure. Remove the bandage (dressing) and gently wash the site with plain soap and water. Gently pat the site dry.  Do not apply powder or lotion to the site.  Do not sit in a bathtub, swimming pool, or whirlpool for 5 to 7 days.  No bending, squatting, or lifting anything over 10 pounds (4.5 kg) as directed by your caregiver.  Inspect the site at least twice daily.  Do not drive home if you are discharged the same day of the procedure. Have someone else drive you.  You may drive 24 hours after the procedure unless otherwise instructed by your caregiver.  What to expect: Any bruising will usually fade within 1 to 2  weeks.  Blood that collects in the tissue (hematoma) may be painful to the touch. It should usually decrease in size and tenderness within 1 to 2 weeks.  SEEK IMMEDIATE MEDICAL CARE IF: You have unusual pain at the groin site or down the affected leg.  You have redness, warmth, swelling, or pain at the groin site.  You have drainage (other than a small amount of blood on the dressing).  You have chills.  You have a fever or persistent symptoms for more than 72 hours.  You have a fever and your symptoms suddenly get worse.  Your leg becomes pale, cool, tingly, or numb.  You have heavy bleeding from the site. Hold pressure on the site. .   Increase activity slowly    Complete by:  As directed    Increase activity slowly    Complete by:  As directed       Discharge Medications   Discharge Medication List as of 08/25/2016  3:06 PM    START taking these medications   Details  aspirin EC 81 MG EC tablet Take 1 tablet (81 mg total) by mouth daily., Starting Sat 08/26/2016, Normal    atorvastatin (LIPITOR) 80 MG tablet Take 1 tablet (80 mg total) by mouth daily at 6 PM., Starting Fri 08/25/2016, Normal    lisinopril (PRINIVIL,ZESTRIL) 2.5 MG tablet Take 1 tablet (2.5 mg total) by mouth daily., Starting Sat 08/26/2016, Normal    nitroGLYCERIN (NITROSTAT) 0.4 MG SL tablet Place 1 tablet (0.4 mg total) under the tongue every 5 (five) minutes x 3 doses as needed for chest pain., Starting Fri 08/25/2016, Normal    ticagrelor (BRILINTA) 90 MG TABS tablet Take 1 tablet (90 mg total) by mouth 2 (two) times daily., Starting Fri 08/25/2016, Normal      CONTINUE these medications which have NOT CHANGED   Details  calcium-vitamin D (OSCAL WITH D) 500-200 MG-UNIT per tablet Take 1 tablet by mouth daily. 1,000 of Vit D3, Historical Med    clobetasol ointment (TEMOVATE) [id number] % Apply 1 application topically 2 (two) times daily as needed. , Starting Mon 08/30/2015, Historical Med    FLUoxetine (PROZAC) 20  MG capsule Take 1 capsule (20 mg total) by mouth daily., Starting Thu 06/08/2016, Normal    Ginger Root POWD by Does not apply route. Mixes 1/2 teaspoon of powder into hot tea once daily, Historical Med    Multiple Vitamins-Minerals (MULTIVITAMIN WITH MINERALS) tablet Take 1 tablet by mouth daily. Centrum Silver, Historical Med    SUMAtriptan (IMITREX) 100  MG tablet TAKE AS DIRECTED., Normal    vitamin C (ASCORBIC ACID) 500 MG tablet Take 500 mg by mouth daily., Historical Med    VITAMIN D, ERGOCALCIFEROL, PO Take 1,000 Units by mouth., Historical Med         Aspirin prescribed at discharge?  Yes High Intensity Statin Prescribed? (Lipitor 40-80mg  or Crestor 20-40mg ): Yes Beta Blocker Prescribed? No: bradycaria For EF 45% or less, Was ACEI/ARB Prescribed? Yes ADP Receptor Inhibitor Prescribed? (i.e. Plavix etc.-Includes Medically Managed Patients): Yes For EF <40%, Aldosterone Inhibitor Prescribed? No: EF normal Was EF assessed during THIS hospitalization? Yes Was Cardiac Rehab II ordered? (Included Medically managed Patients): Yes   Outstanding Labs/Studies   None  Duration of Discharge Encounter   Greater than 30 minutes including physician time.  SignedMaryellen Pile PGY2 IM Resident [phone] 09/04/2016, 8:37 PM

## 2016-08-25 NOTE — Discharge Instructions (Addendum)
Timothy Escobar, Hanson had a heart attack and had stents placed in your Left Anterior Descending Artery (LAD) to restore blood flow. I think you had a plaque rupture that caused the blockage. Because of the stents you will need to be on Aspirin 81 mg daily and Brilinta 90 mg twice a day for at least a year. This will help prevent clots from forming on the stents that were placed. I am also starting you on a cholesterol medication, Atorvastatin 80 mg daily, to try to stabilize and reduced the plaque build up in your arteries. I am starting you on a low dose blood pressure medication called Lisinopril 2.5 mg daily that can help in people with coronary artery disease. This low dose will not affect your blood pressure much and will help prevent remodeling in your heart tissues after this event. Normally, we would also start someone on a beta blocker after a heart attack but your heart rate has been too low to start this.  I will send you home with a nitroglycerin prescription to use if you have chest pain again. If you develop chest pain, shortness of breath or other concerning symptoms, please return to the ED immediatly for evaluation.   Please follow up with the [facility] 08/29/2016 at 10:30 am. Refrain from any strenuous exercise until you are able to follow up.    Heart Attack A heart attack (myocardial infarction, MI) causes damage to your heart that cannot be fixed. A heart attack can happen when a heart (coronary) artery becomes blocked or narrowed. This cuts off the blood supply and oxygen to your heart. When one or more of your coronary arteries becomes blocked, that area of your heart begins to die. This causes the pain you feel during a heart attack. Heart attack pain can also occur in one part of the body but be felt in another part of the body (referred pain). You may feel referred heart attack pain in your left arm, neck, or jaw. Pain may even be felt in the right arm. CAUSES  Many  conditions can cause a heart attack. These include:   Atherosclerosis. This is when a fatty substance (plaque) gradually builds up in the arteries. This buildup can block or reduce the blood supply to one or more of the heart arteries.  A blood clot. A blood clot can develop suddenly when plaque breaks up (ruptures) within a heart artery. A blood clot can block the heart artery, which prevents blood flow to the heart.   Severe tightening (spasm) of the heart artery. This cuts off blood flow through the artery.  RISK FACTORS People at risk for heart attack usually have one or more of the following risk factors:   High blood pressure (hypertension).  High cholesterol.  Smoking.  Being male.  Being overweight or obese.  Older aged.   A family history of heart disease.  Lack of exercise.  Diabetes.  Stress.  Drinking too much alcohol.  Using illegal street drugs, such as cocaine and methamphetamines. SYMPTOMS  Heart attack symptoms can vary from person to person. Symptoms depend on factors like gender and age.   In both men and women, heart attack symptoms can include the following:   Chest pain. This may feel like crushing, squeezing, or a feeling of pressure.  Shortness of breath.  Heartburn or indigestion with or without vomiting, shortness of breath, or sweating.  Sudden cold sweats.  Sudden light-headedness.  Upper back pain.  Women can have unique heart attack symptoms, such as:   Unexplained feelings of nervousness or anxiety.  Discomfort between the shoulder blades or upper back.  Tingling in the hands and arms.   Older people (of both genders) can have subtle heart attack symptoms, such as:   Sweating.  Shortness of breath.  General tiredness or not feeling well.  DIAGNOSIS  Diagnosing a heart attack involves several tests. They include:   An assessment of your vital signs. This includes checking your:  Heart rhythm.  Blood  pressure.  Breathing rate.  Oxygen level.   An ECG (electrocardiogram) to measure the electrical activity of your heart.  Blood tests called cardiac markers. In these tests, blood is drawn at scheduled times to check for the specific proteins or enzymes released by damaged heart muscle.  A chest X-ray.  An echocardiogram to evaluate heart motion and blood flow.  Coronary angiography to look at the heart arteries.  TREATMENT  Treatment for a heart attack may include:   Medicine that breaks up or dissolves blood clots in the heart artery.  Angioplasty.  Cardiac stent placement.  Intra-aortic balloon pump therapy (IABP).  Open heart surgery (coronary artery bypass graft, CABG). HOME CARE INSTRUCTIONS  Take medicines only as directed by your health care provider. You may need to take medicine after a heart attack to:   Keep your blood from clotting too easily.  Control your blood pressure.  Lower your cholesterol.  Control abnormal heart rhythms.   Do not take the following medicines unless your health care provider approves:  Nonsteroidal anti-inflammatory drugs (NSAIDs), such as ibuprofen, naproxen, or celecoxib.  Vitamin supplements that contain vitamin A, vitamin E, or both.  Hormone replacement therapy that contains estrogen with or without progestin.  Make lifestyle changes as directed by your health care provider. These may include:   Quitting smoking, if you smoke.  Getting regular exercise. Ask your health care provider to suggest some activities that are safe for you.  Eating a heart-healthy diet. A registered dietitian can help you learn healthy eating options.  Maintaining a healthy weight.   Managing diabetes, if necessary.  Reducing stress.  Limiting how much alcohol you drink. SEEK IMMEDIATE MEDICAL CARE IF:   You have sudden, unexplained chest discomfort.  You have sudden, unexplained discomfort in your arms, back, neck, or  jaw.  You have shortness of breath at any time.  You suddenly start to sweat or your skin gets clammy.  You feel nauseous or vomit.  You suddenly feel light-headed or dizzy.  Your heart begins to beat fast or feels like it is skipping beats. These symptoms may represent a serious problem that is an emergency. Do not wait to see if the symptoms will go away. Get medical help right away. Call your local emergency services (911 in the [place] Do not drive yourself to the hospital.   This information is not intended to replace advice given to you by your health care provider. Make sure you discuss any questions you have with your health care provider.   Document Released: 10/09/2005 Document Revised: 10/30/2014 Document Reviewed: 12/12/2013 Elsevier Interactive Patient Education [employer].

## 2016-08-25 NOTE — Progress Notes (Signed)
CARDIAC REHAB PHASE I   PRE:  Rate/Rhythm: 66 SR  BP:  Sitting: 134/66        SaO2: 94 RA  MODE:  Ambulation: 700 ft   POST:  Rate/Rhythm: 82 SR  BP:  Sitting: 139/82         SaO2: 98 RA  Pt ambulated 700 ft on RA, hand held assist, brisk, steady gait, tolerated well. Pt c/o mild DOE (pt was conversing during entire walk), denies any other complaints, declined rest stop. Answered questions. Pt hopeful for discharge. Pt to recliner after walk, call bell within reach.   [id number] Lenna Sciara, RN, BSN 08/25/2016 10:59 AM

## 2016-08-25 NOTE — Telephone Encounter (Signed)
7 day TCM fu appt per Timothy Escobar-appt with Timothy Escobar 08-29-16 at 1030am -[facility]

## 2016-08-28 NOTE — Telephone Encounter (Signed)
Patient contacted regarding discharge from [facility] on 08/25/16.  Patient understands to follow up with provider Ignacia Bayley on 08/29/16 at 1030 at [facility]. Patient understands discharge instructions? Yes Patient understands medications and regimen? Yes Patient understands to bring all medications to this visit? Yes

## 2016-08-29 ENCOUNTER — Ambulatory Visit (INDEPENDENT_AMBULATORY_CARE_PROVIDER_SITE_OTHER): Payer: Medicare Other | Admitting: Nurse Practitioner

## 2016-08-29 ENCOUNTER — Encounter: Payer: Self-pay | Admitting: Nurse Practitioner

## 2016-08-29 VITALS — BP 138/78 | HR 77 | Ht 68.0 in | Wt 177.0 lb

## 2016-08-29 DIAGNOSIS — E785 Hyperlipidemia, unspecified: Secondary | ICD-10-CM | POA: Diagnosis not present

## 2016-08-29 DIAGNOSIS — I25118 Atherosclerotic heart disease of native coronary artery with other forms of angina pectoris: Secondary | ICD-10-CM | POA: Diagnosis not present

## 2016-08-29 DIAGNOSIS — I2109 ST elevation (STEMI) myocardial infarction involving other coronary artery of anterior wall: Secondary | ICD-10-CM

## 2016-08-29 DIAGNOSIS — I2510 Atherosclerotic heart disease of native coronary artery without angina pectoris: Secondary | ICD-10-CM | POA: Insufficient documentation

## 2016-08-29 NOTE — Patient Instructions (Addendum)
Medication Instructions:  Your physician recommends that you continue on your current medications as directed. Please refer to the Current Medication list given to you today.  Labwork: Your physician recommends that you return for lab work in: [place], LFT   Testing/Procedures: NONE  Follow-Up: Your physician recommends that you schedule a follow-up appointment in: [place].  Any Other Special Instructions Will Be Listed Below (If Applicable).     If you need a refill on your cardiac medications before your next appointment, please call your pharmacy.

## 2016-08-29 NOTE — Progress Notes (Signed)
Office Visit    Patient Name: Timothy Escobar Date of Encounter: 08/29/2016  Primary Care Provider:  Reginia Forts, MD Primary Cardiologist:  Corky Downs, MD   Chief Complaint    75 year old male status post recent anterior MI who presents for follow-up.  Past Medical History    Past Medical History:  Diagnosis Date  . Blood transfusion without reported diagnosis   . CAD (coronary artery disease)    a. 08/22/2016 Ant STEMI/PCI: LM nl, LAD 30ost, 100p (3.0x15 Synergy DES & 2.75x24 Synergy DES), LCX large/dominant/nl, RCA nondom, nl;  b. 08/2016 Echo: Ef 60-65%, no rwma.  . Depression   . Dyslipidemia   . Skin cancer    skin cancer   Past Surgical History:  Procedure Laterality Date  . APPENDECTOMY    . CARDIAC CATHETERIZATION N/A 08/22/2016   Procedure: Left Heart Cath and Coronary Angiography;  Surgeon: Troy Sine, MD;  Location: [facility];  Service: Cardiovascular;  Laterality: N/A;  . CARDIAC CATHETERIZATION N/A 08/22/2016   Procedure: Coronary Stent Intervention;  Surgeon: Troy Sine, MD;  Location: [facility];  Service: Cardiovascular;  Laterality: N/A;  MID LAD  . HERNIA REPAIR    . vocal fold polyp removed      Allergies  No Known Allergies  History of Present Illness    75 year old male status post recent hospitalization on October 31 after developing chest pain at home. EKG showed anterior ST elevation. He was taken to the cath lab emergently where he was found to have an occluded LAD. This was successfully treated with 2 drug-eluting stents. Postprocedure, echo showed normal LV function. His post procedure course was Dictated by right groin hematoma though hemoglobin was stable. Since discharge, he has done quite well. He has had bruising of the right groin though no significant swelling. It has been tender. He has been slowly increasing his activity as recommended by cardiac rehabilitation. He has not been having any chest pain or  significant dyspnea. He denies PND, orthopnea, dizziness, syncope, edema, or early satiety. He is tolerating his medications.  Home Medications    Prior to Admission medications   Medication Sig Start Date End Date Taking? Authorizing Provider  aspirin EC 81 MG EC tablet Take 1 tablet (81 mg total) by mouth daily. 08/26/16  Yes Maryellen Pile, MD  atorvastatin (LIPITOR) 80 MG tablet Take 1 tablet (80 mg total) by mouth daily at 6 PM. 08/25/16  Yes Maryellen Pile, MD  calcium-vitamin D (OSCAL WITH D) 500-200 MG-UNIT per tablet Take 1 tablet by mouth daily. 1,000 of Vit D3   Yes Historical Provider, MD  clobetasol ointment (TEMOVATE) [id number] % Apply 1 application topically 2 (two) times daily as needed.  08/30/15  Yes Historical Provider, MD  FLUoxetine (PROZAC) 20 MG capsule Take 1 capsule (20 mg total) by mouth daily. 06/08/16  Yes Wendie Agreste, MD  Ginger Root POWD by Does not apply route. Mixes 1/2 teaspoon of powder into hot tea once daily   Yes Historical Provider, MD  lisinopril (PRINIVIL,ZESTRIL) 2.5 MG tablet Take 1 tablet (2.5 mg total) by mouth daily. 08/26/16  Yes Maryellen Pile, MD  Multiple Vitamins-Minerals (MULTIVITAMIN WITH MINERALS) tablet Take 1 tablet by mouth daily. Centrum Silver   Yes Historical Provider, MD  nitroGLYCERIN (NITROSTAT) 0.4 MG SL tablet Place 1 tablet (0.4 mg total) under the tongue every 5 (five) minutes x 3 doses as needed for chest pain. 08/25/16  Yes Maryellen Pile, MD  SUMAtriptan (IMITREX) 100 MG tablet TAKE AS DIRECTED. Patient taking differently: Take 100 mg by mouth daily as needed. TAKE AS DIRECTED. 06/08/16  Yes Wendie Agreste, MD  ticagrelor (BRILINTA) 90 MG TABS tablet Take 1 tablet (90 mg total) by mouth 2 (two) times daily. 08/25/16  Yes Maryellen Pile, MD  vitamin C (ASCORBIC ACID) 500 MG tablet Take 500 mg by mouth daily.   Yes Historical Provider, MD  VITAMIN D, ERGOCALCIFEROL, PO Take 1,000 Units by mouth.   Yes Historical Provider, MD    Review  of Systems    He denies chest pain, palpitations, dyspnea, pnd, orthopnea, n, v, dizziness, syncope, edema, weight gain, or early satiety.  He has had right groin swelling and tenderness though no significant swelling. All other systems reviewed and are otherwise negative except as noted above.  Physical Exam    VS:  BP 138/78   Pulse 77   Ht 5\' 8"  (1.727 m)   Wt 177 lb (80.3 kg)   BMI 26.91 kg/m  , BMI Body mass index is 26.91 kg/m. GEN: Well nourished, well developed, in no acute distress.  HEENT: normal.  Neck: Supple, no JVD, carotid bruits, or masses. Cardiac: RRR, no murmurs, rubs, or gallops. No clubbing, cyanosis, edema.  Radials/DP/PT 2+ and equal bilaterally. The right groin is very ecchymotic extending medially toward his knee. This area is tender though soft. No bruits. No flank bruising.  Respiratory:  Respirations regular and unlabored, clear to auscultation bilaterally. GI: Soft, nontender, nondistended, BS + x 4. MS: no deformity or atrophy. Skin: warm and dry, no rash. Neuro:  Strength and sensation are intact. Psych: Normal affect.  Accessory Clinical Findings    ECG - Sinus bradycardia, 58, early repolarization noted in inferior leads. No acute changes.  Assessment & Plan    1.  Acute anterior ST segment elevation myocardial infarction, subsequent episode of care/coronary artery disease: Status post recent admission with anterior STEMI and occluded LAD. This was successfully stented with 2 drug-eluting stents. He has since been on aspirin, high potency statin, low-dose ACE inhibitor, and Brilinta therapy. He is not on a beta blocker secondary to baseline bradycardia which was first noted in the hospital. He says his rates at home have been in the 50s. In that setting, I will not start beta blocker today. He does plan to enroll in cardiac rehabilitation in a few weeks.  2. Hyperlipidemia: In the setting of above, he is now on high potency statin therapy. LDL was 108  during hospitalization. He will need follow-up lipids and LFTs in 6 weeks.  3. Right groin hematoma: Groin is bruised but otherwise healing well. No bruits noted.  4. Disposition: Follow-up lipids and LFTs in 6 weeks. Follow-up with Dr. Claiborne Billings in 2-3 months.   Murray Hodgkins, NP 08/29/2016, 1:38 PM

## 2016-09-06 DIAGNOSIS — D1801 Hemangioma of skin and subcutaneous tissue: Secondary | ICD-10-CM | POA: Diagnosis not present

## 2016-09-06 DIAGNOSIS — L821 Other seborrheic keratosis: Secondary | ICD-10-CM | POA: Diagnosis not present

## 2016-09-06 DIAGNOSIS — D485 Neoplasm of uncertain behavior of skin: Secondary | ICD-10-CM | POA: Diagnosis not present

## 2016-09-06 DIAGNOSIS — L308 Other specified dermatitis: Secondary | ICD-10-CM | POA: Diagnosis not present

## 2016-09-06 DIAGNOSIS — L905 Scar conditions and fibrosis of skin: Secondary | ICD-10-CM | POA: Diagnosis not present

## 2016-09-28 ENCOUNTER — Encounter (HOSPITAL_COMMUNITY)
Admission: RE | Admit: 2016-09-28 | Discharge: 2016-09-28 | Disposition: A | Discharge status: Home / Self Care | Payer: Medicare Other | Source: Ambulatory Visit | Attending: Cardiovascular Disease | Admitting: Cardiovascular Disease

## 2016-09-28 ENCOUNTER — Encounter (HOSPITAL_COMMUNITY): Payer: Self-pay

## 2016-09-28 VITALS — BP 132/76 | HR 70 | Ht 68.75 in | Wt 173.7 lb

## 2016-09-28 DIAGNOSIS — I2102 ST elevation (STEMI) myocardial infarction involving left anterior descending coronary artery: Secondary | ICD-10-CM | POA: Insufficient documentation

## 2016-09-28 NOTE — Progress Notes (Signed)
Cardiac Rehab Medication Review by a Pharmacist  Does the patient  feel that his/her medications are working for him/her?  yes  Has the patient been experiencing any side effects to the medications prescribed?  no  Does the patient measure his/her own blood pressure or blood glucose at home?  yes - blood pressure  Does the patient have any problems obtaining medications due to transportation or finances?   no  Understanding of regimen: excellent Understanding of indications: excellent Potential of compliance: excellent    Pharmacist comments: Patient reports excellent understanding/compliance. No issues reported.    Elicia Lamp, PharmD, BCPS Clinical Pharmacist 09/28/2016 8:32 AM

## 2016-09-28 NOTE — Progress Notes (Signed)
Cardiac Individual Treatment Plan  Patient Details  Name: Timothy Escobar MRN: [id number] Date of Birth: 1941-03-17 Referring Provider:   Flowsheet Row CARDIAC REHAB PHASE II ORIENTATION from 09/28/2016 in [facility]  Referring Provider  Shelva Majestic MD      Initial Encounter Date:  [place] PHASE II ORIENTATION from 09/28/2016 in [facility]  Date  09/28/16  Referring Provider  Shelva Majestic MD      Visit Diagnosis: 08/22/16 ST elevation myocardial infarction involving left anterior descending (LAD) coronary artery ([place])  Patient's Home Medications on Admission:  Current Outpatient Prescriptions:  .  acetaminophen (TYLENOL) 500 MG tablet, Take 500 mg by mouth every 6 (six) hours as needed for pain or headache., Disp: , Rfl:  .  aspirin EC 81 MG EC tablet, Take 1 tablet (81 mg total) by mouth daily., Disp: 90 tablet, Rfl: 2 .  atorvastatin (LIPITOR) 80 MG tablet, Take 1 tablet (80 mg total) by mouth daily at 6 PM., Disp: 90 tablet, Rfl: 2 .  calcium-vitamin D (OSCAL WITH D) 500-200 MG-UNIT per tablet, Take 1 tablet by mouth daily. 1,000 of Vit D3, Disp: , Rfl:  .  clobetasol ointment (TEMOVATE) [id number] %, Apply 1 application topically 2 (two) times daily as needed. , Disp: , Rfl:  .  FLUoxetine (PROZAC) 20 MG capsule, Take 1 capsule (20 mg total) by mouth daily., Disp: 90 capsule, Rfl: 3 .  Ginger Root POWD, by Does not apply route. Mixes 1/2 teaspoon of powder into hot tea once daily, Disp: , Rfl:  .  lisinopril (PRINIVIL,ZESTRIL) 2.5 MG tablet, Take 1 tablet (2.5 mg total) by mouth daily., Disp: 90 tablet, Rfl: 2 .  Multiple Vitamins-Minerals (MULTIVITAMIN WITH MINERALS) tablet, Take 1 tablet by mouth once a week. Centrum Silver , Disp: , Rfl:  .  nitroGLYCERIN (NITROSTAT) 0.4 MG SL tablet, Place 1 tablet (0.4 mg total) under the tongue every 5 (five) minutes x 3 doses as needed for chest pain., Disp: 30  tablet, Rfl: 12 .  SUMAtriptan (IMITREX) 100 MG tablet, TAKE AS DIRECTED. (Patient taking differently: Take 100 mg by mouth daily as needed. TAKE AS DIRECTED.), Disp: 12 tablet, Rfl: 3 .  ticagrelor (BRILINTA) 90 MG TABS tablet, Take 1 tablet (90 mg total) by mouth 2 (two) times daily., Disp: 180 tablet, Rfl: 2 .  trolamine salicylate (ASPERCREME) 10 % cream, Apply 1 application topically as needed for pain., Disp: , Rfl:  .  vitamin C (ASCORBIC ACID) 500 MG tablet, Take 500 mg by mouth daily., Disp: , Rfl:  .  VITAMIN D, ERGOCALCIFEROL, PO, Take 1,000 Units by mouth., Disp: , Rfl:   Past Medical History: Past Medical History:  Diagnosis Date  . Blood transfusion without reported diagnosis   . CAD (coronary artery disease)    a. 08/22/2016 Ant STEMI/PCI: LM nl, LAD 30ost, 100p (3.0x15 Synergy DES & 2.75x24 Synergy DES), LCX large/dominant/nl, RCA nondom, nl;  b. 08/2016 Echo: Ef 60-65%, no rwma.  . Depression   . Dyslipidemia   . Skin cancer    skin cancer    Tobacco Use: History  Smoking Status  . Never Smoker  Smokeless Tobacco  . Never Used    Labs: Recent Review Flowsheet Data    Labs for ITP Cardiac and Pulmonary Rehab Latest Ref Rng & Units 09/26/2012 10/07/2014 08/23/2016   Cholestrol 0 - 200 mg/dL 173 202(H) 175   LDLCALC 0 - 99 mg/dL 98  124(H) 108(H)   HDL >40 mg/dL 63 63 59   Trlycerides <150 mg/dL 62 76 42   Hemoglobin A1c 4.8 - 5.6 % - - 5.5      Capillary Blood Glucose: No results found for: GLUCAP   Exercise Target Goals: Date: 09/28/16  Exercise Program Goal: Individual exercise prescription set with THRR, safety & activity barriers. Participant demonstrates ability to understand and report RPE using BORG scale, to self-measure pulse accurately, and to acknowledge the importance of the exercise prescription.  Exercise Prescription Goal: Starting with aerobic activity 30 plus minutes a day, 3 days per week for initial exercise prescription. Provide home  exercise prescription and guidelines that participant acknowledges understanding prior to discharge.  Activity Barriers & Risk Stratification:     Activity Barriers & Cardiac Risk Stratification - 09/28/16 1230      Activity Barriers & Cardiac Risk Stratification   Activity Barriers None      6 Minute Walk:     6 Minute Walk    Row Name 09/28/16 0927 09/28/16 1230       6 Minute Walk   Phase Initial  -    Distance 2303 feet  -    Walk Time 6 minutes  -    # of Rest Breaks 0  -    MPH  - 4.4    METS  - 5.3    RPE 12 12    VO2 Peak  - 18.4    Symptoms No  -    Resting HR 70 bpm  -    Resting BP 132/76  -    Max Ex. HR 122 bpm  -    Max Ex. BP 178/82  -    2 Minute Post BP 144/82  -       Initial Exercise Prescription:     Initial Exercise Prescription - 09/28/16 1200      Date of Initial Exercise RX and Referring Provider   Date 09/28/16   Referring Provider Shelva Majestic MD     Treadmill   MPH 3.2   Grade 2   Minutes 10   METs 4.3     Bike   Level 1.2   Minutes 10   METs 3.83     NuStep   Level 3   Minutes 10   METs 2     Prescription Details   Frequency (times per week) 3     Intensity   THRR 40-80% of Max Heartrate 59-118   Ratings of Perceived Exertion 11-13     Progression   Progression Continue progressive overload as per policy without signs/symptoms or physical distress.     Resistance Training   Training Prescription Yes   Weight 3lbs   Reps 10-12      Perform Capillary Blood Glucose checks as needed.  Exercise Prescription Changes:   Exercise Comments:   Discharge Exercise Prescription (Final Exercise Prescription Changes):   Nutrition:  Target Goals: Understanding of nutrition guidelines, daily intake of sodium 1500mg , cholesterol 200mg , calories 30% from fat and 7% or less from saturated fats, daily to have 5 or more servings of fruits and vegetables.  Biometrics:     Pre Biometrics - 09/28/16 1239      Pre  Biometrics   % Body Fat 27.5 %       Nutrition Therapy Plan and Nutrition Goals:   Nutrition Discharge: Nutrition Scores:   Nutrition Goals Re-Evaluation:   Psychosocial: Target Goals: Acknowledge presence or absence of  depression, maximize coping skills, provide positive support system. Participant is able to verbalize types and ability to use techniques and skills needed for reducing stress and depression.  Initial Review & Psychosocial Screening:     Initial Psych Review & Screening - 09/28/16 [place]? Yes   Comments Brief Psychosocial Assessment reveals no needs or further interventions needed     Barriers   Psychosocial barriers to participate in program There are no identifiable barriers or psychosocial needs.     Screening Interventions   Interventions Encouraged to exercise      Quality of Life Scores:     Quality of Life - 09/28/16 1240      Quality of Life Scores   Health/Function Pre 28.67 %   Socioeconomic Pre 28.57 %   Psych/Spiritual Pre 28.21 %   Family Pre 30 %   GLOBAL Pre 28.75 %      PHQ-9: Recent Review Flowsheet Data    Depression screen [facility] 2/9 06/08/2016 10/07/2015 10/07/2014 10/02/2013   Decreased Interest 0 0 0 0   Down, Depressed, Hopeless 0 1 0 0   PHQ - 2 Score 0 1 0 0      Psychosocial Evaluation and Intervention:   Psychosocial Re-Evaluation:   Vocational Rehabilitation: Provide vocational rehab assistance to qualifying candidates.   Vocational Rehab Evaluation & Intervention:     Vocational Rehab - 09/28/16 1702      Initial Vocational Rehab Evaluation & Intervention   Assessment shows need for Vocational Rehabilitation No  Pt denies need for vocational rehab at this time      Education: Education Goals: Education classes will be provided on a weekly basis, covering required topics. Participant will state understanding/return demonstration of topics  presented.  Learning Barriers/Preferences:     Learning Barriers/Preferences - 09/27/16 1231      Learning Barriers/Preferences   Learning Barriers Sight   Learning Preferences Written Material      Education Topics: Count Your Pulse:  -Group instruction provided by verbal instruction, demonstration, patient participation and written materials to support subject.  Instructors address importance of being able to find your pulse and how to count your pulse when at home without a heart monitor.  Patients get hands on experience counting their pulse with staff help and individually.   Heart Attack, Angina, and Risk Factor Modification:  -Group instruction provided by verbal instruction, video, and written materials to support subject.  Instructors address signs and symptoms of angina and heart attacks.    Also discuss risk factors for heart disease and how to make changes to improve heart health risk factors.   Functional Fitness:  -Group instruction provided by verbal instruction, demonstration, patient participation, and written materials to support subject.  Instructors address safety measures for doing things around the house.  Discuss how to get up and down off the floor, how to pick things up properly, how to safely get out of a chair without assistance, and balance training.   Meditation and Mindfulness:  -Group instruction provided by verbal instruction, patient participation, and written materials to support subject.  Instructor addresses importance of mindfulness and meditation practice to help reduce stress and improve awareness.  Instructor also leads participants through a meditation exercise.    Stretching for Flexibility and Mobility:  -Group instruction provided by verbal instruction, patient participation, and written materials to support subject.  Instructors lead participants through series of stretches that are designed to increase  flexibility thus improving mobility.   These stretches are additional exercise for major muscle groups that are typically performed during regular warm up and cool down.   Hands Only CPR Anytime:  -Group instruction provided by verbal instruction, video, patient participation and written materials to support subject.  Instructors co-teach with AHA video for hands only CPR.  Participants get hands on experience with mannequins.   Nutrition I class: Heart Healthy Eating:  -Group instruction provided by PowerPoint slides, verbal discussion, and written materials to support subject matter. The instructor gives an explanation and review of the Therapeutic Lifestyle Changes diet recommendations, which includes a discussion on lipid goals, dietary fat, sodium, fiber, plant stanol/sterol esters, sugar, and the components of a well-balanced, healthy diet.   Nutrition II class: Lifestyle Skills:  -Group instruction provided by PowerPoint slides, verbal discussion, and written materials to support subject matter. The instructor gives an explanation and review of label reading, grocery shopping for heart health, heart healthy recipe modifications, and ways to make healthier choices when eating out.   Diabetes Question & Answer:  -Group instruction provided by PowerPoint slides, verbal discussion, and written materials to support subject matter. The instructor gives an explanation and review of diabetes co-morbidities, pre- and post-prandial blood glucose goals, pre-exercise blood glucose goals, signs, symptoms, and treatment of hypoglycemia and hyperglycemia, and foot care basics.   Diabetes Blitz:  -Group instruction provided by PowerPoint slides, verbal discussion, and written materials to support subject matter. The instructor gives an explanation and review of the physiology behind type 1 and type 2 diabetes, diabetes medications and rational behind using different medications, pre- and post-prandial blood glucose recommendations and  Hemoglobin A1c goals, diabetes diet, and exercise including blood glucose guidelines for exercising safely.    Portion Distortion:  -Group instruction provided by PowerPoint slides, verbal discussion, written materials, and food models to support subject matter. The instructor gives an explanation of serving size versus portion size, changes in portions sizes over the last 20 years, and what consists of a serving from each food group.   Stress Management:  -Group instruction provided by verbal instruction, video, and written materials to support subject matter.  Instructors review role of stress in heart disease and how to cope with stress positively.     Exercising on Your Own:  -Group instruction provided by verbal instruction, power point, and written materials to support subject.  Instructors discuss benefits of exercise, components of exercise, frequency and intensity of exercise, and end points for exercise.  Also discuss use of nitroglycerin and activating EMS.  Review options of places to exercise outside of rehab.  Review guidelines for sex with heart disease.   Cardiac Drugs I:  -Group instruction provided by verbal instruction and written materials to support subject.  Instructor reviews cardiac drug classes: antiplatelets, anticoagulants, beta blockers, and statins.  Instructor discusses reasons, side effects, and lifestyle considerations for each drug class.   Cardiac Drugs II:  -Group instruction provided by verbal instruction and written materials to support subject.  Instructor reviews cardiac drug classes: angiotensin converting enzyme inhibitors (ACE-I), angiotensin II receptor blockers (ARBs), nitrates, and calcium channel blockers.  Instructor discusses reasons, side effects, and lifestyle considerations for each drug class.   Anatomy and Physiology of the Circulatory System:  -Group instruction provided by verbal instruction, video, and written materials to support  subject.  Reviews functional anatomy of heart, how it relates to various diagnoses, and what role the heart plays in the overall system.   Knowledge  Questionnaire Score:     Knowledge Questionnaire Score - 09/28/16 0929      Knowledge Questionnaire Score   Pre Score 22/24      Core Components/Risk Factors/Patient Goals at Admission:     Personal Goals and Risk Factors at Admission - 09/28/16 0920      Core Components/Risk Factors/Patient Goals on Admission    Weight Management Yes   Intervention Weight Management: Develop a combined nutrition and exercise program designed to reach desired caloric intake, while maintaining appropriate intake of nutrient and fiber, sodium and fats, and appropriate energy expenditure required for the weight goal.;Weight Management: Provide education and appropriate resources to help participant work on and attain dietary goals.;Weight Management/Obesity: Establish reasonable short term and long term weight goals.;Obesity: Provide education and appropriate resources to help participant work on and attain dietary goals.   Expected Outcomes Long Term: Adherence to nutrition and physical activity/exercise program aimed toward attainment of established weight goal;Short Term: Continue to assess and modify interventions until short term weight is achieved;Weight Maintenance: Understanding of the daily nutrition guidelines, which includes 25-35% calories from fat, 7% or less cal from saturated fats, less than 200mg  cholesterol, less than 1.5gm of sodium, & 5 or more servings of fruits and vegetables daily;Weight Loss: Understanding of general recommendations for a balanced deficit meal plan, which promotes 1-2 lb weight loss per week and includes a negative energy balance of [phone] kcal/d;Understanding recommendations for meals to include 15-35% energy as protein, 25-35% energy from fat, 35-60% energy from carbohydrates, less than 200mg  of dietary cholesterol, 20-35 gm  of total fiber daily;Understanding of distribution of calorie intake throughout the day with the consumption of 4-5 meals/snacks   Increase Strength and Stamina Yes   Intervention Provide advice, education, support and counseling about physical activity/exercise needs.;Develop an individualized exercise prescription for aerobic and resistive training based on initial evaluation findings, risk stratification, comorbidities and participant's personal goals.   Expected Outcomes Achievement of increased cardiorespiratory fitness and enhanced flexibility, muscular endurance and strength shown through measurements of functional capacity and personal statement of participant.   Improve shortness of breath with ADL's Yes   Intervention Provide education, individualized exercise plan and daily activity instruction to help decrease symptoms of SOB with activities of daily living.   Expected Outcomes Short Term: Achieves a reduction of symptoms when performing activities of daily living.   Personal Goal Other Yes   Personal Goal Learn activity restrictions/limitations. Be able to exert self in confidence and exercise safely. Be able to split wood, transfer and carry, and use a push mower.   Intervention Provide education on exercise programming to assist with increasing strength and exercise tolerance    Expected Outcomes Pt will be able to use a push mower and other tools needed to split wood and learn activity limitations to be able to perform tasks in confidence.      Core Components/Risk Factors/Patient Goals Review:    Core Components/Risk Factors/Patient Goals at Discharge (Final Review):    ITP Comments:     ITP Comments    Row Name 09/28/16 0918           ITP Comments Dr. Fransico Him, Medical Director          Comments:  Pt in today for cardiac rehab orientation from 0800-1030.  As a part of the orientation appt, pt completed warm up stretches and 6 minute walk test.  Pt tolerated  very well with no complaints of any chest pain or sob.  Monitor showed SR with no noted ectopy. Brief Psychosocial Assessment reveals no barriers or needs at this time. No further intervention needed at this time. Cherre Huger, BSN

## 2016-09-30 ENCOUNTER — Emergency Department (HOSPITAL_COMMUNITY): Payer: Medicare Other

## 2016-09-30 ENCOUNTER — Emergency Department (HOSPITAL_COMMUNITY)
Admission: EM | Admit: 2016-09-30 | Discharge: 2016-09-30 | Disposition: A | Discharge status: Home / Self Care | Payer: Medicare Other | Attending: Emergency Medicine | Admitting: Emergency Medicine

## 2016-09-30 ENCOUNTER — Encounter (HOSPITAL_COMMUNITY): Payer: Self-pay

## 2016-09-30 DIAGNOSIS — I2510 Atherosclerotic heart disease of native coronary artery without angina pectoris: Secondary | ICD-10-CM | POA: Diagnosis not present

## 2016-09-30 DIAGNOSIS — I252 Old myocardial infarction: Secondary | ICD-10-CM | POA: Diagnosis not present

## 2016-09-30 DIAGNOSIS — Z85828 Personal history of other malignant neoplasm of skin: Secondary | ICD-10-CM | POA: Diagnosis not present

## 2016-09-30 DIAGNOSIS — Z7982 Long term (current) use of aspirin: Secondary | ICD-10-CM | POA: Diagnosis not present

## 2016-09-30 DIAGNOSIS — Z79899 Other long term (current) drug therapy: Secondary | ICD-10-CM | POA: Diagnosis not present

## 2016-09-30 DIAGNOSIS — R531 Weakness: Secondary | ICD-10-CM | POA: Insufficient documentation

## 2016-09-30 DIAGNOSIS — R0602 Shortness of breath: Secondary | ICD-10-CM | POA: Diagnosis not present

## 2016-09-30 LAB — CBC
HCT: 44.6 % (ref 39.0–52.0)
Hemoglobin: 15.3 g/dL (ref 13.0–17.0)
MCH: 31.9 pg (ref 26.0–34.0)
MCHC: 34.3 g/dL (ref 30.0–36.0)
MCV: 92.9 fL (ref 78.0–100.0)
Platelets: 183 10*3/uL (ref 150–400)
RBC: 4.8 MIL/uL (ref 4.22–5.81)
RDW: 12.7 % (ref 11.5–15.5)
WBC: 6.7 10*3/uL (ref 4.0–10.5)

## 2016-09-30 LAB — BASIC METABOLIC PANEL
Anion gap: 9 (ref 5–15)
BUN: 19 mg/dL (ref 6–20)
CO2: 24 mmol/L (ref 22–32)
Calcium: 8.9 mg/dL (ref 8.9–10.3)
Chloride: 105 mmol/L (ref 101–111)
Creatinine, Ser: 0.86 mg/dL (ref 0.61–1.24)
GFR calc Af Amer: >60 mL/min (ref >60)
GFR calc non Af Amer: >60 mL/min (ref >60)
Glucose, Bld: 107 mg/dL — ABNORMAL HIGH (ref 65–99)
Potassium: 3.9 mmol/L (ref 3.5–5.1)
Sodium: 138 mmol/L (ref 135–145)

## 2016-09-30 LAB — I-STAT TROPONIN, ED: Troponin i, poc: 0 ng/mL (ref 0.00–0.08)

## 2016-09-30 LAB — TROPONIN I: Troponin I: <0.03 ng/mL (ref <0.03)

## 2016-09-30 NOTE — ED Provider Notes (Signed)
[facility] DEPT Provider Note   CSN: [id number] Arrival date & time: 09/30/16  1033  By signing my name below, I, Timothy Escobar, attest that this documentation has been prepared under the direction and in the presence of Virgel Manifold, MD. Electronically Signed: Reola Escobar, ED Scribe. 09/30/16. 11:12 AM.  History   Chief Complaint Chief Complaint  Patient presents with  . Fatigue  . Shortness of Breath   The history is provided by the patient and medical records. No language interpreter was used.    HPI Comments: Timothy Escobar is a 75 y.o. male with a PMHx including CAD s/p stent placement, acute STEMI, and dyslipidemia, who presents to the Emergency Department complaining of gradually worsening fatigue and shortness of breath onset this morning. He states that he woke up with his symptoms this morning. His wife notes associated pallor and increased belching since the onset of his symptoms as well. Pt also reports that he was moderately nauseated earlier in the morning, but it has since resolved. Pt reports that he was recently admitted to the hospital on 08/22/16 following a sudden onset of chest pressure. At that time, his workup revealed an acute STEMI and concluded with stent placement to the LAD. Since this stent placement and subsequent d/c he notes that he has been feeling back at his baseline and otherwise completely normal. He denies his symptoms today feeling similar. Pt currently is anticoagulated on Brilinta daily. He denies chest pain, wound involvement, melena, bloody stools, vomiting, or any other associated symptoms.   PCP: Reginia Forts, MD Cardiologist: Shelva Majestic, MD  Past Medical History:  Diagnosis Date  . Blood transfusion without reported diagnosis   . CAD (coronary artery disease)    a. 08/22/2016 Ant STEMI/PCI: LM nl, LAD 30ost, 100p (3.0x15 Synergy DES & 2.75x24 Synergy DES), LCX large/dominant/nl, RCA nondom, nl;  b. 08/2016 Echo: Ef  60-65%, no rwma.  . Depression   . Dyslipidemia   . Skin cancer    skin cancer   Patient Active Problem List   Diagnosis Date Noted  . Dyslipidemia   . CAD (coronary artery disease)   . Status post coronary artery stent placement   . Bradycardia   . Hypokalemia   . Hyperlipidemia with target LDL less than 70   . STEMI (ST elevation myocardial infarction) ([place]) 08/22/2016  . Acute ST elevation myocardial infarction (STEMI) involving left anterior descending (LAD) coronary artery ([place]) 08/22/2016  . Hip pain 09/28/2012  . Chronic depression 09/28/2012   Past Surgical History:  Procedure Laterality Date  . APPENDECTOMY    . CARDIAC CATHETERIZATION N/A 08/22/2016   Procedure: Left Heart Cath and Coronary Angiography;  Surgeon: Troy Sine, MD;  Location: [facility];  Service: Cardiovascular;  Laterality: N/A;  . CARDIAC CATHETERIZATION N/A 08/22/2016   Procedure: Coronary Stent Intervention;  Surgeon: Troy Sine, MD;  Location: [facility];  Service: Cardiovascular;  Laterality: N/A;  MID LAD  . HERNIA REPAIR    . vocal fold polyp removed      Home Medications    Prior to Admission medications   Medication Sig Start Date End Date Taking? Authorizing Provider  acetaminophen (TYLENOL) 500 MG tablet Take 500 mg by mouth every 6 (six) hours as needed for pain or headache.    Historical Provider, MD  aspirin EC 81 MG EC tablet Take 1 tablet (81 mg total) by mouth daily. 08/26/16   Maryellen Pile, MD  atorvastatin (LIPITOR) 80 MG  tablet Take 1 tablet (80 mg total) by mouth daily at 6 PM. 08/25/16   Maryellen Pile, MD  calcium-vitamin D (OSCAL WITH D) 500-200 MG-UNIT per tablet Take 1 tablet by mouth daily. 1,000 of Vit D3    Historical Provider, MD  clobetasol ointment (TEMOVATE) [id number] % Apply 1 application topically 2 (two) times daily as needed.  08/30/15   Historical Provider, MD  FLUoxetine (PROZAC) 20 MG capsule Take 1 capsule (20 mg total) by mouth daily. 06/08/16    Wendie Agreste, MD  Ginger Root POWD by Does not apply route. Mixes 1/2 teaspoon of powder into hot tea once daily    Historical Provider, MD  lisinopril (PRINIVIL,ZESTRIL) 2.5 MG tablet Take 1 tablet (2.5 mg total) by mouth daily. 08/26/16   Maryellen Pile, MD  Multiple Vitamins-Minerals (MULTIVITAMIN WITH MINERALS) tablet Take 1 tablet by mouth once a week. Centrum Silver     Historical Provider, MD  nitroGLYCERIN (NITROSTAT) 0.4 MG SL tablet Place 1 tablet (0.4 mg total) under the tongue every 5 (five) minutes x 3 doses as needed for chest pain. 08/25/16   Maryellen Pile, MD  SUMAtriptan (IMITREX) 100 MG tablet TAKE AS DIRECTED. Patient taking differently: Take 100 mg by mouth daily as needed. TAKE AS DIRECTED. 06/08/16   Wendie Agreste, MD  ticagrelor (BRILINTA) 90 MG TABS tablet Take 1 tablet (90 mg total) by mouth 2 (two) times daily. 08/25/16   Maryellen Pile, MD  trolamine salicylate (ASPERCREME) 10 % cream Apply 1 application topically as needed for pain.    Historical Provider, MD  vitamin C (ASCORBIC ACID) 500 MG tablet Take 500 mg by mouth daily.    Historical Provider, MD  VITAMIN D, ERGOCALCIFEROL, PO Take 1,000 Units by mouth.    Historical Provider, MD   Family History Family History  Problem Relation Age of Onset  . Dementia Mother   . Cancer Father     skin  . Heart disease Maternal Grandmother   . Stroke Maternal Grandmother   . Cancer Sister     breast (pt did not specific L or R)  . Cancer Brother     bladder and pancreatic  . Heart disease Brother   . Cancer Brother   . Heart disease Sister   . Cancer Sister   . Hypertension Sister   . Cancer Paternal Grandmother    Social History Social History  Substance Use Topics  . Smoking status: Never Smoker  . Smokeless tobacco: Never Used  . Alcohol use 3.5 oz/week    7 Standard drinks or equivalent per week     Comment: wine: 1 glass daily   Allergies   Patient has no known allergies.  Review of  Systems Review of Systems  Constitutional: Positive for diaphoresis and fatigue.  Respiratory: Positive for shortness of breath.   Cardiovascular: Negative for chest pain.  Gastrointestinal: Positive for nausea. Negative for blood in stool and vomiting.  Skin: Negative for wound.  All other systems reviewed and are negative.  Physical Exam Updated Vital Signs BP 120/72   Pulse (!) 52   Temp 98.2 F (36.8 C) (Oral)   Resp 18   SpO2 93%   Physical Exam  Constitutional: He appears well-developed and well-nourished.  HENT:  Head: Normocephalic.  Right Ear: External ear normal.  Left Ear: External ear normal.  Nose: Nose normal.  Eyes: Conjunctivae are normal. Right eye exhibits no discharge. Left eye exhibits no discharge.  Neck: Normal range of motion.  Cardiovascular: Normal rate, regular rhythm and normal heart sounds.   No murmur heard. Pulmonary/Chest: Effort normal and breath sounds normal. No respiratory distress. He has no wheezes. He has no rales.  Abdominal: Soft. There is no tenderness. There is no rebound and no guarding.  Musculoskeletal: Normal range of motion. He exhibits no edema or tenderness.  Neurological: He is alert. No cranial nerve deficit. Coordination normal.  Skin: Skin is warm and dry. No erythema. No pallor.  Psychiatric: He has a normal mood and affect. His behavior is normal.  Nursing note and vitals reviewed.  ED Treatments / Results  DIAGNOSTIC STUDIES: Oxygen Saturation is 98% on RA, normal by my interpretation.   COORDINATION OF CARE: 11:12 AM-Discussed next steps with pt. Pt verbalized understanding and is agreeable with the plan.   Labs (all labs ordered are listed, but only abnormal results are displayed) Labs Reviewed  BASIC METABOLIC PANEL - Abnormal; Notable for the following:       Result Value   Glucose, Bld 107 (*)    All other components within normal limits  CBC  TROPONIN I  I-STAT TROPOININ, ED   EKG  EKG  Interpretation  Date/Time:  Saturday September 30 2016 10:44:12 EST Ventricular Rate:  78 PR Interval:  186 QRS Duration: 90 QT Interval:  366 QTC Calculation: 417 R Axis:   91 Text Interpretation:  Normal sinus rhythm Rightward axis Borderline ECG Confirmed by Wilson Singer  MD, Myda Detwiler [id number]) on 09/30/2016 11:10:48 AM      Radiology No results found.  Procedures Procedures   Medications Ordered in ED Medications - No data to display  Initial Impression / Assessment and Plan / ED Course  I have reviewed the triage vital signs and the nursing notes.  Pertinent labs & imaging results that were available during my care of the patient were reviewed by me and considered in my medical decision making (see chart for details).  Clinical Course     75yM with generalized weakness of unclear etiology.No pain or dyspnea. Now fully resolved. Observed in ED for several hours. Remains HD stable. W/u reassuring.   Final Clinical Impressions(s) / ED Diagnoses   Final diagnoses:  Generalized weakness    New Prescriptions New Prescriptions   No medications on file   I personally preformed the services scribed in my presence. The recorded information has been reviewed is accurate. Virgel Manifold, MD.     Virgel Manifold, MD 10/04/16 [phone]

## 2016-09-30 NOTE — ED Triage Notes (Addendum)
Patient awoke with fatigue this am with shortness of breath. Concerned that he just had 2 cardiac stents placed the past 2 weeks. No CP on arrival. Alert and oriented. Patient had syncopal/vagal event x 2 in triage. Patient pale and diaphoretic. Event lasting 10-20 seconds.

## 2016-09-30 NOTE — ED Notes (Signed)
Upon arrival to ED pt very  diaphoretic  And weak , , pt now dry and resting without complaints

## 2016-09-30 NOTE — ED Notes (Signed)
Patient transported to X-ray 

## 2016-10-06 ENCOUNTER — Telehealth: Payer: Self-pay | Admitting: Cardiovascular Disease

## 2016-10-06 ENCOUNTER — Encounter (HOSPITAL_COMMUNITY)
Admission: RE | Admit: 2016-10-06 | Discharge: 2016-10-06 | Disposition: A | Discharge status: Home / Self Care | Payer: Medicare Other | Source: Ambulatory Visit | Attending: Cardiovascular Disease | Admitting: Cardiovascular Disease

## 2016-10-06 DIAGNOSIS — I2102 ST elevation (STEMI) myocardial infarction involving left anterior descending coronary artery: Secondary | ICD-10-CM | POA: Diagnosis not present

## 2016-10-06 NOTE — Telephone Encounter (Signed)
Received call from [facility] at Cardiac Rehab-expressed concerns regarding pt BP.  Today was first day of exercise, patients BP increased to 180/80 HR 114 while on the treadmill.  Reports she took him off the treadmill and it instantly decreased.  Reports BP 162/70 HR 104 on bike.  Resting BP- 126/70 HR 58.  Pt denies symptoms.  Pt reports he was seen in the ER on 12/9 for fatigue and SOB, was d/c but reports syncopal episode x 2 while being evaluated (pt reports possibly being related to blood draw?).  Pt not on beta blocker d/t bradycardia per d/c note.  Pt currently taking 2.5mg  Lisinopril daily.   Advised will route to MD Claiborne Billings for recommendations/advice and will contact patient if any changes are recommended.  Verbalized understanding.

## 2016-10-06 NOTE — Telephone Encounter (Signed)
New message      Pt is at cardiac rehab and his bp while exercising was 180/80.  Pt also was seen at the ER last sat

## 2016-10-06 NOTE — Progress Notes (Signed)
Daily Session Note  Patient Details  Name: Timothy Escobar MRN: [id number] Date of Birth: 04/12/1941 Referring Provider:   Flowsheet Row CARDIAC REHAB PHASE II ORIENTATION from 09/28/2016 in [place]  Referring Provider  Shelva Majestic MD      Encounter Date: 10/06/2016  Check In:     Session Check In - 10/06/16 1118      Check-In   Location [facility]   Staff Present Maurice Small, RN, BSN;Molly diVincenzo, MS, ACSM RCEP, Exercise Physiologist;Amber Fair, MS, ACSM RCEP, Exercise Physiologist;Brendi Mccarroll, RN, BSN   Supervising physician immediately available to respond to emergencies [facility] immediately available   Physician(s) Dr. Karleen Hampshire   Medication changes reported     No   Fall or balance concerns reported    No   Warm-up and Cool-down Performed as group-led instruction   Resistance Training Performed Yes   VAD Patient? No     Pain Assessment   Currently in Pain? No/denies   Multiple Pain Sites No      Capillary Blood Glucose: No results found for this or any previous visit (from the past 24 hour(s)).   Goals Met:  No report of cardiac concerns or symptoms  Goals Unmet:  BP  Comments: Pt started cardiac rehab today.  Pt tolerated light exercise without difficulty., telemetry-Sinus rhythm,[name]'s blood pressure was noted at 162/70 on the airdyne and 180/70 on the treadmill. Recheck blood pressure 118/78. asymptomatic.  Medication list reconciled. Pt denies barriers to medicaiton compliance.  PSYCHOSOCIAL ASSESSMENT:  PHQ-0. Pt exhibits positive coping skills, hopeful outlook with supportive family. No psychosocial needs identified at this time, no psychosocial interventions necessary.    Pt enjoys singing.   Pt oriented to exercise equipment and routine.    Understanding verbalized. [name] told me that he went to the ED over the weekend with reports of feeling weak. [name] reported that he passed out  twice while he was in the ED. [facility] triage RN called and notified at Dr [facility] office.  Will fax exercise flow sheets to Dr. Evette Georges office for review. [name] did not have any complaints today during exercise at cardiac rehab. Will continue to monitor the patient throughout  the program.Timothy Metallo Venetia Maxon, RN,BSN 10/06/2016 4:36 PM    Dr. Fransico Him is Medical Director for Cardiac Rehab at [facility].

## 2016-10-09 ENCOUNTER — Encounter (HOSPITAL_COMMUNITY)
Admission: RE | Admit: 2016-10-09 | Discharge: 2016-10-09 | Disposition: A | Discharge status: Home / Self Care | Payer: Medicare Other | Source: Ambulatory Visit | Attending: Cardiovascular Disease | Admitting: Cardiovascular Disease

## 2016-10-09 DIAGNOSIS — I2102 ST elevation (STEMI) myocardial infarction involving left anterior descending coronary artery: Secondary | ICD-10-CM | POA: Diagnosis not present

## 2016-10-10 DIAGNOSIS — E785 Hyperlipidemia, unspecified: Secondary | ICD-10-CM | POA: Diagnosis not present

## 2016-10-10 LAB — HEPATIC FUNCTION PANEL
ALK PHOS: 61 U/L (ref 40–115)
ALT: 23 U/L (ref 9–46)
AST: 22 U/L (ref 10–35)
Albumin: 4.1 g/dL (ref 3.6–5.1)
Bilirubin, Direct: 0.2 mg/dL (ref <=0.2)
Indirect Bilirubin: 0.5 mg/dL (ref 0.2–1.2)
TOTAL PROTEIN: 6.2 g/dL (ref 6.1–8.1)
Total Bilirubin: 0.7 mg/dL (ref 0.2–1.2)

## 2016-10-10 LAB — LIPID PANEL
Cholesterol: 107 mg/dL (ref <200)
HDL: 46 mg/dL (ref >40)
LDL CALC: 51 mg/dL (ref <100)
TRIGLYCERIDES: 52 mg/dL (ref <150)
Total CHOL/HDL Ratio: 2.3 Ratio (ref <5.0)
VLDL: 10 mg/dL (ref <30)

## 2016-10-11 ENCOUNTER — Encounter (HOSPITAL_COMMUNITY)
Admission: RE | Admit: 2016-10-11 | Discharge: 2016-10-11 | Disposition: A | Discharge status: Home / Self Care | Payer: Medicare Other | Source: Ambulatory Visit | Attending: Cardiovascular Disease | Admitting: Cardiovascular Disease

## 2016-10-11 DIAGNOSIS — I2102 ST elevation (STEMI) myocardial infarction involving left anterior descending coronary artery: Secondary | ICD-10-CM | POA: Diagnosis not present

## 2016-10-13 ENCOUNTER — Encounter (HOSPITAL_COMMUNITY)
Admission: RE | Admit: 2016-10-13 | Discharge: 2016-10-13 | Disposition: A | Discharge status: Home / Self Care | Payer: Medicare Other | Source: Ambulatory Visit | Attending: Cardiovascular Disease | Admitting: Cardiovascular Disease

## 2016-10-13 DIAGNOSIS — I2102 ST elevation (STEMI) myocardial infarction involving left anterior descending coronary artery: Secondary | ICD-10-CM | POA: Diagnosis not present

## 2016-10-18 ENCOUNTER — Encounter (HOSPITAL_COMMUNITY)
Admission: RE | Admit: 2016-10-18 | Discharge: 2016-10-18 | Disposition: A | Discharge status: Home / Self Care | Payer: Medicare Other | Source: Ambulatory Visit | Attending: Cardiovascular Disease | Admitting: Cardiovascular Disease

## 2016-10-18 DIAGNOSIS — I2102 ST elevation (STEMI) myocardial infarction involving left anterior descending coronary artery: Secondary | ICD-10-CM

## 2016-10-20 ENCOUNTER — Encounter (HOSPITAL_COMMUNITY)
Admission: RE | Admit: 2016-10-20 | Discharge: 2016-10-20 | Disposition: A | Discharge status: Home / Self Care | Payer: Medicare Other | Source: Ambulatory Visit | Attending: Cardiovascular Disease | Admitting: Cardiovascular Disease

## 2016-10-20 DIAGNOSIS — I2102 ST elevation (STEMI) myocardial infarction involving left anterior descending coronary artery: Secondary | ICD-10-CM | POA: Diagnosis not present

## 2016-10-20 NOTE — Progress Notes (Signed)
Reviewed home exercise program with pt.  Discussed THRR, RPE scale, mode/frequency/duration of exercise and weather conditions for exercising outdoors.  Also discussed NTG use, signs/symptoms and when to call Dr./911.  Pt verbalized understanding.  Cleda Mccreedy, MS 10/20/2016 12:38

## 2016-10-24 NOTE — Progress Notes (Signed)
Cardiac Individual Treatment Plan  Patient Details  Name: Timothy Escobar MRN: [id number] Date of Birth: 23-Apr-1941 Referring Provider:   Flowsheet Row CARDIAC REHAB PHASE II ORIENTATION from 09/28/2016 in [place]  Referring Provider  Shelva Majestic MD      Initial Encounter Date:  [place] PHASE II ORIENTATION from 09/28/2016 in [place]  Date  09/28/16  Referring Provider  Shelva Majestic MD      Visit Diagnosis: 08/22/16 ST elevation myocardial infarction involving left anterior descending (LAD) coronary artery ([place])  Patient's Home Medications on Admission:  Current Outpatient Prescriptions:  .  acetaminophen (TYLENOL) 500 MG tablet, Take 500 mg by mouth every 6 (six) hours as needed for pain or headache., Disp: , Rfl:  .  aspirin EC 81 MG EC tablet, Take 1 tablet (81 mg total) by mouth daily., Disp: 90 tablet, Rfl: 2 .  atorvastatin (LIPITOR) 80 MG tablet, Take 1 tablet (80 mg total) by mouth daily at 6 PM., Disp: 90 tablet, Rfl: 2 .  calcium-vitamin D (OSCAL WITH D) 500-200 MG-UNIT per tablet, Take 1 tablet by mouth daily. 1,000 of Vit D3, Disp: , Rfl:  .  clobetasol ointment (TEMOVATE) 1.44 %, Apply 1 application topically 2 (two) times daily as needed. , Disp: , Rfl:  .  FLUoxetine (PROZAC) 20 MG capsule, Take 1 capsule (20 mg total) by mouth daily., Disp: 90 capsule, Rfl: 3 .  Ginger Root POWD, by Does not apply route. Mixes 1/2 teaspoon of powder into hot tea once daily, Disp: , Rfl:  .  lisinopril (PRINIVIL,ZESTRIL) 2.5 MG tablet, Take 1 tablet (2.5 mg total) by mouth daily., Disp: 90 tablet, Rfl: 2 .  Multiple Vitamins-Minerals (MULTIVITAMIN WITH MINERALS) tablet, Take 1 tablet by mouth once a week. Centrum Silver , Disp: , Rfl:  .  nitroGLYCERIN (NITROSTAT) 0.4 MG SL tablet, Place 1 tablet (0.4 mg total) under the tongue every 5 (five) minutes x 3 doses as needed for chest pain., Disp: 30  tablet, Rfl: 12 .  SUMAtriptan (IMITREX) 100 MG tablet, TAKE AS DIRECTED. (Patient taking differently: Take 100 mg by mouth daily as needed. TAKE AS DIRECTED.), Disp: 12 tablet, Rfl: 3 .  ticagrelor (BRILINTA) 90 MG TABS tablet, Take 1 tablet (90 mg total) by mouth 2 (two) times daily., Disp: 180 tablet, Rfl: 2 .  trolamine salicylate (ASPERCREME) 10 % cream, Apply 1 application topically as needed for pain., Disp: , Rfl:  .  vitamin C (ASCORBIC ACID) 500 MG tablet, Take 500 mg by mouth daily., Disp: , Rfl:  .  VITAMIN D, ERGOCALCIFEROL, PO, Take 1,000 Units by mouth., Disp: , Rfl:   Past Medical History: Past Medical History:  Diagnosis Date  . Blood transfusion without reported diagnosis   . CAD (coronary artery disease)    a. 08/22/2016 Ant STEMI/PCI: LM nl, LAD 30ost, 100p (3.0x15 Synergy DES & 2.75x24 Synergy DES), LCX large/dominant/nl, RCA nondom, nl;  b. 08/2016 Echo: Ef 60-65%, no rwma.  . Depression   . Dyslipidemia   . Skin cancer    skin cancer    Tobacco Use: History  Smoking Status  . Never Smoker  Smokeless Tobacco  . Never Used    Labs: Recent Review Flowsheet Data    Labs for ITP Cardiac and Pulmonary Rehab Latest Ref Rng & Units 09/26/2012 10/07/2014 08/23/2016 10/10/2016   Cholestrol <200 mg/dL 173 202(H) 175 107   LDLCALC <100 mg/dL 98 124(H) 108(H)  51   HDL >40 mg/dL 63 63 59 46   Trlycerides <150 mg/dL 62 76 42 52   Hemoglobin A1c 4.8 - 5.6 % - - 5.5 -      Capillary Blood Glucose: No results found for: GLUCAP   Exercise Target Goals:    Exercise Program Goal: Individual exercise prescription set with THRR, safety & activity barriers. Participant demonstrates ability to understand and report RPE using BORG scale, to self-measure pulse accurately, and to acknowledge the importance of the exercise prescription.  Exercise Prescription Goal: Starting with aerobic activity 30 plus minutes a day, 3 days per week for initial exercise prescription. Provide  home exercise prescription and guidelines that participant acknowledges understanding prior to discharge.  Activity Barriers & Risk Stratification:     Activity Barriers & Cardiac Risk Stratification - 09/28/16 1230      Activity Barriers & Cardiac Risk Stratification   Activity Barriers None      6 Minute Walk:     6 Minute Walk    Row Name 09/28/16 0927 09/28/16 1230       6 Minute Walk   Phase Initial  -    Distance 2303 feet  -    Walk Time 6 minutes  -    # of Rest Breaks 0  -    MPH  - 4.4    METS  - 5.3    RPE 12 12    VO2 Peak  - 18.4    Symptoms No  -    Resting HR 70 bpm  -    Resting BP 132/76  -    Max Ex. HR 122 bpm  -    Max Ex. BP 178/82  -    2 Minute Post BP 144/82  -       Initial Exercise Prescription:     Initial Exercise Prescription - 09/28/16 1200      Date of Initial Exercise RX and Referring Provider   Date 09/28/16   Referring Provider Shelva Majestic MD     Treadmill   MPH 3.2   Grade 2   Minutes 10   METs 4.3     Bike   Level 1.2   Minutes 10   METs 3.83     NuStep   Level 3   Minutes 10   METs 2     Prescription Details   Frequency (times per week) 3     Intensity   THRR 40-80% of Max Heartrate 59-118   Ratings of Perceived Exertion 11-13     Progression   Progression Continue progressive overload as per policy without signs/symptoms or physical distress.     Resistance Training   Training Prescription Yes   Weight 3lbs   Reps 10-12      Perform Capillary Blood Glucose checks as needed.  Exercise Prescription Changes:      Exercise Prescription Changes    Row Name 10/25/16 1200             Exercise Review   Progression Yes         Response to Exercise   Blood Pressure (Admit) 124/70       Blood Pressure (Exercise) 148/70       Blood Pressure (Exit) 114/78       Heart Rate (Admit) 78 bpm       Heart Rate (Exercise) 106 bpm       Heart Rate (Exit) 72 bpm  Rating of Perceived Exertion  (Exercise) 12       Duration Progress to 45 minutes of aerobic exercise without signs/symptoms of physical distress       Intensity THRR unchanged         Progression   Progression Continue to progress workloads to maintain intensity without signs/symptoms of physical distress.       Average METs 3.7         Resistance Training   Training Prescription Yes       Weight 5lbs       Reps 10-12         Treadmill   MPH 2.5       Grade 2       Minutes 10       METs 3.6         Bike   Level 1.2       Minutes 10       METs 3.9         NuStep   Level 4       Minutes 10       METs 3.6         Home Exercise Plan   Plans to continue exercise at Home       Frequency Add 2 additional days to program exercise sessions.          Exercise Comments:      Exercise Comments    Row Name 10/25/16 1210           Exercise Comments Reviewed METs and goals with pt.  Pt is doing well with exercise           Discharge Exercise Prescription (Final Exercise Prescription Changes):     Exercise Prescription Changes - 10/25/16 1200      Exercise Review   Progression Yes     Response to Exercise   Blood Pressure (Admit) 124/70   Blood Pressure (Exercise) 148/70   Blood Pressure (Exit) 114/78   Heart Rate (Admit) 78 bpm   Heart Rate (Exercise) 106 bpm   Heart Rate (Exit) 72 bpm   Rating of Perceived Exertion (Exercise) 12   Duration Progress to 45 minutes of aerobic exercise without signs/symptoms of physical distress   Intensity THRR unchanged     Progression   Progression Continue to progress workloads to maintain intensity without signs/symptoms of physical distress.   Average METs 3.7     Resistance Training   Training Prescription Yes   Weight 5lbs   Reps 10-12     Treadmill   MPH 2.5   Grade 2   Minutes 10   METs 3.6     Bike   Level 1.2   Minutes 10   METs 3.9     NuStep   Level 4   Minutes 10   METs 3.6     Home Exercise Plan   Plans to continue  exercise at Home   Frequency Add 2 additional days to program exercise sessions.      Nutrition:  Target Goals: Understanding of nutrition guidelines, daily intake of sodium '1500mg'$ , cholesterol '200mg'$ , calories 30% from fat and 7% or less from saturated fats, daily to have 5 or more servings of fruits and vegetables.  Biometrics:     Pre Biometrics - 09/28/16 1239      Pre Biometrics   % Body Fat 27.5 %       Nutrition Therapy Plan and Nutrition Goals:   Nutrition Discharge: Nutrition  Scores:   Nutrition Goals Re-Evaluation:   Psychosocial: Target Goals: Acknowledge presence or absence of depression, maximize coping skills, provide positive support system. Participant is able to verbalize types and ability to use techniques and skills needed for reducing stress and depression.  Initial Review & Psychosocial Screening:     Initial Psych Review & Screening - 09/28/16 [place]? Yes   Comments Brief Psychosocial Assessment reveals no needs or further interventions needed     Barriers   Psychosocial barriers to participate in program There are no identifiable barriers or psychosocial needs.     Screening Interventions   Interventions Encouraged to exercise      Quality of Life Scores:     Quality of Life - 09/28/16 1240      Quality of Life Scores   Health/Function Pre 28.67 %   Socioeconomic Pre 28.57 %   Psych/Spiritual Pre 28.21 %   Family Pre 30 %   GLOBAL Pre 28.75 %      PHQ-9: Recent Review Flowsheet Data    Depression screen [facility] 2/9 06/08/2016 10/07/2015 10/07/2014 10/02/2013   Decreased Interest 0 0 0 0   Down, Depressed, Hopeless 0 1 0 0   PHQ - 2 Score 0 1 0 0      Psychosocial Evaluation and Intervention:   Psychosocial Re-Evaluation:     Psychosocial Re-Evaluation    Row Name 10/24/16 1619             Psychosocial Re-Evaluation   Interventions Encouraged to attend Cardiac Rehabilitation  for the exercise       Continued Psychosocial Services Needed No          Vocational Rehabilitation: Provide vocational rehab assistance to qualifying candidates.   Vocational Rehab Evaluation & Intervention:     Vocational Rehab - 09/28/16 1702      Initial Vocational Rehab Evaluation & Intervention   Assessment shows need for Vocational Rehabilitation No  Pt denies need for vocational rehab at this time      Education: Education Goals: Education classes will be provided on a weekly basis, covering required topics. Participant will state understanding/return demonstration of topics presented.  Learning Barriers/Preferences:     Learning Barriers/Preferences - 09/27/16 1231      Learning Barriers/Preferences   Learning Barriers Sight   Learning Preferences Written Material      Education Topics: Count Your Pulse:  -Group instruction provided by verbal instruction, demonstration, patient participation and written materials to support subject.  Instructors address importance of being able to find your pulse and how to count your pulse when at home without a heart monitor.  Patients get hands on experience counting their pulse with staff help and individually.   Heart Attack, Angina, and Risk Factor Modification:  -Group instruction provided by verbal instruction, video, and written materials to support subject.  Instructors address signs and symptoms of angina and heart attacks.    Also discuss risk factors for heart disease and how to make changes to improve heart health risk factors.   Functional Fitness:  -Group instruction provided by verbal instruction, demonstration, patient participation, and written materials to support subject.  Instructors address safety measures for doing things around the house.  Discuss how to get up and down off the floor, how to pick things up properly, how to safely get out of a chair without assistance, and balance training.   Meditation  and Mindfulness:  -Group instruction  provided by verbal instruction, patient participation, and written materials to support subject.  Instructor addresses importance of mindfulness and meditation practice to help reduce stress and improve awareness.  Instructor also leads participants through a meditation exercise.    Stretching for Flexibility and Mobility:  -Group instruction provided by verbal instruction, patient participation, and written materials to support subject.  Instructors lead participants through series of stretches that are designed to increase flexibility thus improving mobility.  These stretches are additional exercise for major muscle groups that are typically performed during regular warm up and cool down.   Hands Only CPR Anytime:  -Group instruction provided by verbal instruction, video, patient participation and written materials to support subject.  Instructors co-teach with AHA video for hands only CPR.  Participants get hands on experience with mannequins.   Nutrition I class: Heart Healthy Eating:  -Group instruction provided by PowerPoint slides, verbal discussion, and written materials to support subject matter. The instructor gives an explanation and review of the Therapeutic Lifestyle Changes diet recommendations, which includes a discussion on lipid goals, dietary fat, sodium, fiber, plant stanol/sterol esters, sugar, and the components of a well-balanced, healthy diet.   Nutrition II class: Lifestyle Skills:  -Group instruction provided by PowerPoint slides, verbal discussion, and written materials to support subject matter. The instructor gives an explanation and review of label reading, grocery shopping for heart health, heart healthy recipe modifications, and ways to make healthier choices when eating out.   Diabetes Question & Answer:  -Group instruction provided by PowerPoint slides, verbal discussion, and written materials to support subject matter. The  instructor gives an explanation and review of diabetes co-morbidities, pre- and post-prandial blood glucose goals, pre-exercise blood glucose goals, signs, symptoms, and treatment of hypoglycemia and hyperglycemia, and foot care basics.   Diabetes Blitz:  -Group instruction provided by PowerPoint slides, verbal discussion, and written materials to support subject matter. The instructor gives an explanation and review of the physiology behind type 1 and type 2 diabetes, diabetes medications and rational behind using different medications, pre- and post-prandial blood glucose recommendations and Hemoglobin A1c goals, diabetes diet, and exercise including blood glucose guidelines for exercising safely.    Portion Distortion:  -Group instruction provided by PowerPoint slides, verbal discussion, written materials, and food models to support subject matter. The instructor gives an explanation of serving size versus portion size, changes in portions sizes over the last 20 years, and what consists of a serving from each food group.   Stress Management:  -Group instruction provided by verbal instruction, video, and written materials to support subject matter.  Instructors review role of stress in heart disease and how to cope with stress positively.     Exercising on Your Own:  -Group instruction provided by verbal instruction, power point, and written materials to support subject.  Instructors discuss benefits of exercise, components of exercise, frequency and intensity of exercise, and end points for exercise.  Also discuss use of nitroglycerin and activating EMS.  Review options of places to exercise outside of rehab.  Review guidelines for sex with heart disease.   Cardiac Drugs I:  -Group instruction provided by verbal instruction and written materials to support subject.  Instructor reviews cardiac drug classes: antiplatelets, anticoagulants, beta blockers, and statins.  Instructor discusses  reasons, side effects, and lifestyle considerations for each drug class.   Cardiac Drugs II:  -Group instruction provided by verbal instruction and written materials to support subject.  Instructor reviews cardiac drug classes: angiotensin converting enzyme inhibitors (  ACE-I), angiotensin II receptor blockers (ARBs), nitrates, and calcium channel blockers.  Instructor discusses reasons, side effects, and lifestyle considerations for each drug class.   Anatomy and Physiology of the Circulatory System:  -Group instruction provided by verbal instruction, video, and written materials to support subject.  Reviews functional anatomy of heart, how it relates to various diagnoses, and what role the heart plays in the overall system. Flowsheet Row CARDIAC REHAB PHASE II EXERCISE from 10/11/2016 in [place]  Date  10/11/16  Instruction Review Code  2- meets goals/outcomes      Knowledge Questionnaire Score:     Knowledge Questionnaire Score - 09/28/16 7096      Knowledge Questionnaire Score   Pre Score 22/24      Core Components/Risk Factors/Patient Goals at Admission:     Personal Goals and Risk Factors at Admission - 09/28/16 0920      Core Components/Risk Factors/Patient Goals on Admission    Weight Management Yes   Intervention Weight Management: Develop a combined nutrition and exercise program designed to reach desired caloric intake, while maintaining appropriate intake of nutrient and fiber, sodium and fats, and appropriate energy expenditure required for the weight goal.;Weight Management: Provide education and appropriate resources to help participant work on and attain dietary goals.;Weight Management/Obesity: Establish reasonable short term and long term weight goals.;Obesity: Provide education and appropriate resources to help participant work on and attain dietary goals.   Expected Outcomes Long Term: Adherence to nutrition and physical  activity/exercise program aimed toward attainment of established weight goal;Short Term: Continue to assess and modify interventions until short term weight is achieved;Weight Maintenance: Understanding of the daily nutrition guidelines, which includes 25-35% calories from fat, 7% or less cal from saturated fats, less than '200mg'$  cholesterol, less than 1.5gm of sodium, & 5 or more servings of fruits and vegetables daily;Weight Loss: Understanding of general recommendations for a balanced deficit meal plan, which promotes 1-2 lb weight loss per week and includes a negative energy balance of [phone] kcal/d;Understanding recommendations for meals to include 15-35% energy as protein, 25-35% energy from fat, 35-60% energy from carbohydrates, less than '200mg'$  of dietary cholesterol, 20-35 gm of total fiber daily;Understanding of distribution of calorie intake throughout the day with the consumption of 4-5 meals/snacks   Increase Strength and Stamina Yes   Intervention Provide advice, education, support and counseling about physical activity/exercise needs.;Develop an individualized exercise prescription for aerobic and resistive training based on initial evaluation findings, risk stratification, comorbidities and participant's personal goals.   Expected Outcomes Achievement of increased cardiorespiratory fitness and enhanced flexibility, muscular endurance and strength shown through measurements of functional capacity and personal statement of participant.   Improve shortness of breath with ADL's Yes   Intervention Provide education, individualized exercise plan and daily activity instruction to help decrease symptoms of SOB with activities of daily living.   Expected Outcomes Short Term: Achieves a reduction of symptoms when performing activities of daily living.   Personal Goal Other Yes   Personal Goal Learn activity restrictions/limitations. Be able to exert self in confidence and exercise safely. Be able to  split wood, transfer and carry, and use a push mower.   Intervention Provide education on exercise programming to assist with increasing strength and exercise tolerance    Expected Outcomes Pt will be able to use a push mower and other tools needed to split wood and learn activity limitations to be able to perform tasks in confidence.      Core  Components/Risk Factors/Patient Goals Review:      Goals and Risk Factor Review    Row Name 10/25/16 1211             Core Components/Risk Factors/Patient Goals Review   Personal Goals Review Increase Strength and Stamina;Other       Review pt states he has more energy and he is tolerating WL increases well.        Expected Outcomes Develop a regular exercise routine and increase overall cardiorespiratory fitness          Core Components/Risk Factors/Patient Goals at Discharge (Final Review):      Goals and Risk Factor Review - 10/25/16 1211      Core Components/Risk Factors/Patient Goals Review   Personal Goals Review Increase Strength and Stamina;Other   Review pt states he has more energy and he is tolerating WL increases well.    Expected Outcomes Develop a regular exercise routine and increase overall cardiorespiratory fitness      ITP Comments:     ITP Comments    Row Name 09/28/16 0918 10/20/16 1235         ITP Comments Dr. Fransico Him, Medical Director attended CPR education class.  outcomes and goals met          Comments: Rush [facility]mark is making expected progress toward personal goals after completing 6 sessions. Recommend continued exercise and life style modification education including  stress management and relaxation techniques to decrease cardiac risk profile. Barnet Pall, RN,BSN 10/26/2016 11:22 AM

## 2016-10-25 ENCOUNTER — Encounter (HOSPITAL_COMMUNITY)
Admission: RE | Admit: 2016-10-25 | Discharge: 2016-10-25 | Disposition: A | Discharge status: Home / Self Care | Payer: Medicare Other | Source: Ambulatory Visit | Attending: Cardiovascular Disease | Admitting: Cardiovascular Disease

## 2016-10-25 DIAGNOSIS — I2102 ST elevation (STEMI) myocardial infarction involving left anterior descending coronary artery: Secondary | ICD-10-CM | POA: Insufficient documentation

## 2016-10-26 NOTE — Telephone Encounter (Signed)
This may be a normal response to exercise.  As long as blood pressure is low normally and not significantly exaggerated with exercise, is probably okay.  Apparently beta blocker was discontinued in the past due to bradycardia.  If blood pressure is consistently greater than 130 at rest can increase lisinopril to 5 mg daily.

## 2016-10-27 ENCOUNTER — Encounter (HOSPITAL_COMMUNITY)
Admission: RE | Admit: 2016-10-27 | Discharge: 2016-10-27 | Disposition: A | Discharge status: Home / Self Care | Payer: Medicare Other | Source: Ambulatory Visit | Attending: Cardiovascular Disease | Admitting: Cardiovascular Disease

## 2016-10-27 DIAGNOSIS — I2102 ST elevation (STEMI) myocardial infarction involving left anterior descending coronary artery: Secondary | ICD-10-CM | POA: Diagnosis not present

## 2016-10-27 NOTE — Telephone Encounter (Signed)
Timothy Escobar at Cardiac rehab aware of Dr. Claiborne Billings advice and recommendations.

## 2016-10-30 ENCOUNTER — Encounter (HOSPITAL_COMMUNITY)
Admission: RE | Admit: 2016-10-30 | Discharge: 2016-10-30 | Disposition: A | Discharge status: Home / Self Care | Payer: Medicare Other | Source: Ambulatory Visit | Attending: Cardiovascular Disease | Admitting: Cardiovascular Disease

## 2016-10-30 DIAGNOSIS — I2102 ST elevation (STEMI) myocardial infarction involving left anterior descending coronary artery: Secondary | ICD-10-CM | POA: Diagnosis not present

## 2016-11-01 ENCOUNTER — Encounter (HOSPITAL_COMMUNITY)
Admission: RE | Admit: 2016-11-01 | Discharge: 2016-11-01 | Disposition: A | Discharge status: Home / Self Care | Payer: Medicare Other | Source: Ambulatory Visit | Attending: Cardiovascular Disease | Admitting: Cardiovascular Disease

## 2016-11-01 DIAGNOSIS — I2102 ST elevation (STEMI) myocardial infarction involving left anterior descending coronary artery: Secondary | ICD-10-CM

## 2016-11-03 ENCOUNTER — Encounter (HOSPITAL_COMMUNITY)
Admission: RE | Admit: 2016-11-03 | Discharge: 2016-11-03 | Disposition: A | Discharge status: Home / Self Care | Payer: Medicare Other | Source: Ambulatory Visit | Attending: Cardiovascular Disease | Admitting: Cardiovascular Disease

## 2016-11-03 DIAGNOSIS — I2102 ST elevation (STEMI) myocardial infarction involving left anterior descending coronary artery: Secondary | ICD-10-CM

## 2016-11-06 ENCOUNTER — Encounter (HOSPITAL_COMMUNITY)
Admission: RE | Admit: 2016-11-06 | Discharge: 2016-11-06 | Disposition: A | Discharge status: Home / Self Care | Payer: Medicare Other | Source: Ambulatory Visit | Attending: Cardiovascular Disease | Admitting: Cardiovascular Disease

## 2016-11-06 DIAGNOSIS — I2102 ST elevation (STEMI) myocardial infarction involving left anterior descending coronary artery: Secondary | ICD-10-CM

## 2016-11-08 ENCOUNTER — Encounter (HOSPITAL_COMMUNITY): Payer: Medicare Other

## 2016-11-10 ENCOUNTER — Encounter (HOSPITAL_COMMUNITY)
Admission: RE | Admit: 2016-11-10 | Discharge: 2016-11-10 | Disposition: A | Discharge status: Home / Self Care | Payer: Medicare Other | Source: Ambulatory Visit | Attending: Cardiovascular Disease | Admitting: Cardiovascular Disease

## 2016-11-10 DIAGNOSIS — I2102 ST elevation (STEMI) myocardial infarction involving left anterior descending coronary artery: Secondary | ICD-10-CM

## 2016-11-13 ENCOUNTER — Encounter (HOSPITAL_COMMUNITY)
Admission: RE | Admit: 2016-11-13 | Discharge: 2016-11-13 | Disposition: A | Discharge status: Home / Self Care | Payer: Medicare Other | Source: Ambulatory Visit | Attending: Cardiovascular Disease | Admitting: Cardiovascular Disease

## 2016-11-13 DIAGNOSIS — I2102 ST elevation (STEMI) myocardial infarction involving left anterior descending coronary artery: Secondary | ICD-10-CM

## 2016-11-15 ENCOUNTER — Encounter (HOSPITAL_COMMUNITY): Payer: Medicare Other

## 2016-11-17 ENCOUNTER — Encounter (HOSPITAL_COMMUNITY): Payer: Medicare Other

## 2016-11-20 ENCOUNTER — Encounter (HOSPITAL_COMMUNITY): Payer: Medicare Other

## 2016-11-20 ENCOUNTER — Telehealth (HOSPITAL_COMMUNITY): Payer: Self-pay | Admitting: Family Medicine

## 2016-11-21 ENCOUNTER — Telehealth (HOSPITAL_COMMUNITY): Payer: Self-pay | Admitting: *Deleted

## 2016-11-21 ENCOUNTER — Ambulatory Visit (INDEPENDENT_AMBULATORY_CARE_PROVIDER_SITE_OTHER): Payer: Medicare Other | Admitting: Student

## 2016-11-21 DIAGNOSIS — J209 Acute bronchitis, unspecified: Secondary | ICD-10-CM

## 2016-11-21 NOTE — Progress Notes (Signed)
Cardiac Individual Treatment Plan  Patient Details  Name: Timothy Escobar MRN: [id number] Date of Birth: [birth year] Referring Provider:   Flowsheet Row CARDIAC REHAB PHASE II ORIENTATION from 09/28/2016 in [place]  Referring Provider  Shelva Majestic MD      Initial Encounter Date:  [place] PHASE II ORIENTATION from 09/28/2016 in [place]  Date  09/28/16  Referring Provider  Shelva Majestic MD      Visit Diagnosis: No diagnosis found.  Patient's Home Medications on Admission:  Current Outpatient Prescriptions:  .  acetaminophen (TYLENOL) 500 MG tablet, Take 500 mg by mouth every 6 (six) hours as needed for pain or headache., Disp: , Rfl:  .  aspirin EC 81 MG EC tablet, Take 1 tablet (81 mg total) by mouth daily., Disp: 90 tablet, Rfl: 2 .  atorvastatin (LIPITOR) 80 MG tablet, Take 1 tablet (80 mg total) by mouth daily at 6 PM., Disp: 90 tablet, Rfl: 2 .  calcium-vitamin D (OSCAL WITH D) 500-200 MG-UNIT per tablet, Take 1 tablet by mouth daily. 1,000 of Vit D3, Disp: , Rfl:  .  clobetasol ointment (TEMOVATE) 9.50 %, Apply 1 application topically 2 (two) times daily as needed. , Disp: , Rfl:  .  FLUoxetine (PROZAC) 20 MG capsule, Take 1 capsule (20 mg total) by mouth daily., Disp: 90 capsule, Rfl: 3 .  Ginger Root POWD, by Does not apply route. Mixes 1/2 teaspoon of powder into hot tea once daily, Disp: , Rfl:  .  lisinopril (PRINIVIL,ZESTRIL) 2.5 MG tablet, Take 1 tablet (2.5 mg total) by mouth daily., Disp: 90 tablet, Rfl: 2 .  Multiple Vitamins-Minerals (MULTIVITAMIN WITH MINERALS) tablet, Take 1 tablet by mouth once a week. Centrum Silver , Disp: , Rfl:  .  nitroGLYCERIN (NITROSTAT) 0.4 MG SL tablet, Place 1 tablet (0.4 mg total) under the tongue every 5 (five) minutes x 3 doses as needed for chest pain., Disp: 30 tablet, Rfl: 12 .  SUMAtriptan (IMITREX) 100 MG tablet, TAKE AS DIRECTED. (Patient  taking differently: Take 100 mg by mouth daily as needed. TAKE AS DIRECTED.), Disp: 12 tablet, Rfl: 3 .  ticagrelor (BRILINTA) 90 MG TABS tablet, Take 1 tablet (90 mg total) by mouth 2 (two) times daily., Disp: 180 tablet, Rfl: 2 .  vitamin C (ASCORBIC ACID) 500 MG tablet, Take 500 mg by mouth daily., Disp: , Rfl:  .  VITAMIN D, ERGOCALCIFEROL, PO, Take 1,000 Units by mouth., Disp: , Rfl:   Past Medical History: Past Medical History:  Diagnosis Date  . Blood transfusion without reported diagnosis   . CAD (coronary artery disease)    a. 08/22/2016 [facility] STEMI/PCI: LM nl, LAD 30ost, 100p (3.0x15 Synergy DES & 2.75x24 Synergy DES), LCX large/dominant/nl, RCA nondom, nl;  b. 08/2016 Echo: Ef 60-65%, no rwma.  . Depression   . Dyslipidemia   . Skin cancer    skin cancer    Tobacco Use: History  Smoking Status  . Never Smoker  Smokeless Tobacco  . Never Used    Labs: Recent Review Flowsheet Data    Labs for ITP Cardiac and Pulmonary Rehab Latest Ref Rng & Units 09/26/2012 10/07/2014 08/23/2016 10/10/2016   Cholestrol <200 mg/dL 173 202(H) 175 107   LDLCALC <100 mg/dL 98 124(H) 108(H) 51   HDL >40 mg/dL 63 63 59 46   Trlycerides <150 mg/dL 62 76 42 52   Hemoglobin A1c 4.8 - 5.6 % - - 5.5 -  Capillary Blood Glucose: No results found for: GLUCAP   Exercise Target Goals:    Exercise Program Goal: Individual exercise prescription set with THRR, safety & activity barriers. Participant demonstrates ability to understand and report RPE using BORG scale, to self-measure pulse accurately, and to acknowledge the importance of the exercise prescription.  Exercise Prescription Goal: Starting with aerobic activity 30 plus minutes a day, 3 days per week for initial exercise prescription. Provide home exercise prescription and guidelines that participant acknowledges understanding prior to discharge.  Activity Barriers & Risk Stratification:     Activity Barriers & Cardiac Risk  Stratification - 09/28/16 1230      Activity Barriers & Cardiac Risk Stratification   Activity Barriers None      6 Minute Walk:     6 Minute Walk    Row Name 09/28/16 0927 09/28/16 1230       6 Minute Walk   Phase Initial  -    Distance 2303 feet  -    Walk Time 6 minutes  -    # of Rest Breaks 0  -    MPH  - 4.4    METS  - 5.3    RPE 12 12    VO2 Peak  - 18.4    Symptoms No  -    Resting HR 70 bpm  -    Resting BP 132/76  -    Max Ex. HR 122 bpm  -    Max Ex. BP 178/82  -    2 Minute Post BP 144/82  -       Initial Exercise Prescription:     Initial Exercise Prescription - 09/28/16 1200      Date of Initial Exercise RX and Referring Provider   Date 09/28/16   Referring Provider Shelva Majestic MD     Treadmill   MPH 3.2   Grade 2   Minutes 10   METs 4.3     Bike   Level 1.2   Minutes 10   METs 3.83     NuStep   Level 3   Minutes 10   METs 2     Prescription Details   Frequency (times per week) 3     Intensity   THRR 40-80% of Max Heartrate 59-118   Ratings of Perceived Exertion 11-13     Progression   Progression Continue progressive overload as per policy without signs/symptoms or physical distress.     Resistance Training   Training Prescription Yes   Weight 3lbs   Reps 10-12      Perform Capillary Blood Glucose checks as needed.  Exercise Prescription Changes:      Exercise Prescription Changes    Row Name 10/25/16 1200 11/20/16 1300           Exercise Review   Progression Yes Yes        Response to Exercise   Blood Pressure (Admit) 124/70 122/60      Blood Pressure (Exercise) 148/70 130/80      Blood Pressure (Exit) 114/78 112/64      Heart Rate (Admit) 78 bpm 59 bpm      Heart Rate (Exercise) 106 bpm 112 bpm      Heart Rate (Exit) 72 bpm 72 bpm      Rating of Perceived Exertion (Exercise) 12 12      Duration Progress to 45 minutes of aerobic exercise without signs/symptoms of physical distress Progress to 45 minutes  of aerobic exercise  without signs/symptoms of physical distress      Intensity THRR unchanged THRR unchanged        Progression   Progression Continue to progress workloads to maintain intensity without signs/symptoms of physical distress. Continue to progress workloads to maintain intensity without signs/symptoms of physical distress.      Average METs 3.7 4.9        Resistance Training   Training Prescription Yes Yes      Weight 5lbs 6lb      Reps 10-12 10-12        Treadmill   MPH 2.5 2.8      Grade 2 4      Minutes 10 10      METs 3.6 4.7        Bike   Level 1.2 1.8      Minutes 10 10      METs 3.9 5.4        NuStep   Level 4 5      Minutes 10 10      METs 3.6 4.5        Home Exercise Plan   Plans to continue exercise at [place] 2 additional days to program exercise sessions. Add 2 additional days to program exercise sessions.         Exercise Comments:      Exercise Comments    Row Name 10/25/16 1210 11/20/16 1347         Exercise Comments Reviewed METs and goals with pt.  Pt is doing well with exercise  Reviewed METs and goals with pt.  Pt is doing well with exercise          Discharge Exercise Prescription (Final Exercise Prescription Changes):     Exercise Prescription Changes - 11/20/16 1300      Exercise Review   Progression Yes     Response to Exercise   Blood Pressure (Admit) 122/60   Blood Pressure (Exercise) 130/80   Blood Pressure (Exit) 112/64   Heart Rate (Admit) 59 bpm   Heart Rate (Exercise) 112 bpm   Heart Rate (Exit) 72 bpm   Rating of Perceived Exertion (Exercise) 12   Duration Progress to 45 minutes of aerobic exercise without signs/symptoms of physical distress   Intensity THRR unchanged     Progression   Progression Continue to progress workloads to maintain intensity without signs/symptoms of physical distress.   Average METs 4.9     Resistance Training   Training Prescription Yes   Weight 6lb    Reps 10-12     Treadmill   MPH 2.8   Grade 4   Minutes 10   METs 4.7     Bike   Level 1.8   Minutes 10   METs 5.4     NuStep   Level 5   Minutes 10   METs 4.5     Home Exercise Plan   Plans to continue exercise at Home   Frequency Add 2 additional days to program exercise sessions.      Nutrition:  Target Goals: Understanding of nutrition guidelines, daily intake of sodium '1500mg'$ , cholesterol '200mg'$ , calories 30% from fat and 7% or less from saturated fats, daily to have 5 or more servings of fruits and vegetables.  Biometrics:     Pre Biometrics - 09/28/16 1239      Pre Biometrics   % Body Fat 27.5 %       Nutrition  Therapy Plan and Nutrition Goals:   Nutrition Discharge: Nutrition Scores:   Nutrition Goals Re-Evaluation:   Psychosocial: Target Goals: Acknowledge presence or absence of depression, maximize coping skills, provide positive support system. Participant is able to verbalize types and ability to use techniques and skills needed for reducing stress and depression.  Initial Review & Psychosocial Screening:     Initial Psych Review & Screening - 09/28/16 [place]? Yes   Comments Brief Psychosocial Assessment reveals no needs or further interventions needed     Barriers   Psychosocial barriers to participate in program There are no identifiable barriers or psychosocial needs.     Screening Interventions   Interventions Encouraged to exercise      Quality of Life Scores:     Quality of Life - 09/28/16 1240      Quality of Life Scores   Health/Function Pre 28.67 %   Socioeconomic Pre 28.57 %   Psych/Spiritual Pre 28.21 %   Family Pre 30 %   GLOBAL Pre 28.75 %      PHQ-9: Recent Review Flowsheet Data    Depression screen [facility] 2/9 11/21/2016 06/08/2016 10/07/2015 10/07/2014 10/02/2013   Decreased Interest 0 0 0 0 0   Down, Depressed, Hopeless 0 0 1 0 0   PHQ - 2 Score 0 0 1 0 0       Psychosocial Evaluation and Intervention:   Psychosocial Re-Evaluation:     Psychosocial Re-Evaluation    Row Name 10/24/16 1619 11/21/16 1140           Psychosocial Re-Evaluation   Interventions Encouraged to attend Cardiac Rehabilitation for the exercise Encouraged to attend Cardiac Rehabilitation for the exercise      Continued Psychosocial Services Needed No No         Vocational Rehabilitation: Provide vocational rehab assistance to qualifying candidates.   Vocational Rehab Evaluation & Intervention:     Vocational Rehab - 09/28/16 1702      Initial Vocational Rehab Evaluation & Intervention   Assessment shows need for Vocational Rehabilitation No  Pt denies need for vocational rehab at this time      Education: Education Goals: Education classes will be provided on a weekly basis, covering required topics. Participant will state understanding/return demonstration of topics presented.  Learning Barriers/Preferences:     Learning Barriers/Preferences - 09/27/16 1231      Learning Barriers/Preferences   Learning Barriers Sight   Learning Preferences Written Material      Education Topics: Count Your Pulse:  -Group instruction provided by verbal instruction, demonstration, patient participation and written materials to support subject.  Instructors address importance of being able to find your pulse and how to count your pulse when at home without a heart monitor.  Patients get hands on experience counting their pulse with staff help and individually. Flowsheet Row CARDIAC REHAB PHASE II EXERCISE from 10/27/2016 in [place]  Date  10/27/16  Educator  Barnet Pall RN  Instruction Review Code  2- meets goals/outcomes      Heart Attack, Angina, and Risk Factor Modification:  -Group instruction provided by verbal instruction, video, and written materials to support subject.  Instructors address signs and symptoms of angina  and heart attacks.    Also discuss risk factors for heart disease and how to make changes to improve heart health risk factors.   Functional Fitness:  -Group instruction provided by verbal instruction,  demonstration, patient participation, and written materials to support subject.  Instructors address safety measures for doing things around the house.  Discuss how to get up and down off the floor, how to pick things up properly, how to safely get out of a chair without assistance, and balance training.   Meditation and Mindfulness:  -Group instruction provided by verbal instruction, patient participation, and written materials to support subject.  Instructor addresses importance of mindfulness and meditation practice to help reduce stress and improve awareness.  Instructor also leads participants through a meditation exercise.    Stretching for Flexibility and Mobility:  -Group instruction provided by verbal instruction, patient participation, and written materials to support subject.  Instructors lead participants through series of stretches that are designed to increase flexibility thus improving mobility.  These stretches are additional exercise for major muscle groups that are typically performed during regular warm up and cool down.   Hands Only CPR Anytime:  -Group instruction provided by verbal instruction, video, patient participation and written materials to support subject.  Instructors co-teach with AHA video for hands only CPR.  Participants get hands on experience with mannequins.   Nutrition I class: Heart Healthy Eating:  -Group instruction provided by PowerPoint slides, verbal discussion, and written materials to support subject matter. The instructor gives an explanation and review of the Therapeutic Lifestyle Changes diet recommendations, which includes a discussion on lipid goals, dietary fat, sodium, fiber, plant stanol/sterol esters, sugar, and the components of a  well-balanced, healthy diet.   Nutrition II class: Lifestyle Skills:  -Group instruction provided by PowerPoint slides, verbal discussion, and written materials to support subject matter. The instructor gives an explanation and review of label reading, grocery shopping for heart health, heart healthy recipe modifications, and ways to make healthier choices when eating out.   Diabetes Question & Answer:  -Group instruction provided by PowerPoint slides, verbal discussion, and written materials to support subject matter. The instructor gives an explanation and review of diabetes co-morbidities, pre- and post-prandial blood glucose goals, pre-exercise blood glucose goals, signs, symptoms, and treatment of hypoglycemia and hyperglycemia, and foot care basics.   Diabetes Blitz:  -Group instruction provided by PowerPoint slides, verbal discussion, and written materials to support subject matter. The instructor gives an explanation and review of the physiology behind type 1 and type 2 diabetes, diabetes medications and rational behind using different medications, pre- and post-prandial blood glucose recommendations and Hemoglobin A1c goals, diabetes diet, and exercise including blood glucose guidelines for exercising safely.    Portion Distortion:  -Group instruction provided by PowerPoint slides, verbal discussion, written materials, and food models to support subject matter. The instructor gives an explanation of serving size versus portion size, changes in portions sizes over the last 20 years, and what consists of a serving from each food group.   Stress Management:  -Group instruction provided by verbal instruction, video, and written materials to support subject matter.  Instructors review role of stress in heart disease and how to cope with stress positively.   Flowsheet Row CARDIAC REHAB PHASE II EXERCISE from 11/10/2016 in [place]  Date  11/10/16   Instruction Review Code  2- meets goals/outcomes      Exercising on Your Own:  -Group instruction provided by verbal instruction, power point, and written materials to support subject.  Instructors discuss benefits of exercise, components of exercise, frequency and intensity of exercise, and end points for exercise.  Also discuss use of nitroglycerin and activating EMS.  Review options of places to exercise outside of rehab.  Review guidelines for sex with heart disease.   Cardiac Drugs I:  -Group instruction provided by verbal instruction and written materials to support subject.  Instructor reviews cardiac drug classes: antiplatelets, anticoagulants, beta blockers, and statins.  Instructor discusses reasons, side effects, and lifestyle considerations for each drug class.   Cardiac Drugs II:  -Group instruction provided by verbal instruction and written materials to support subject.  Instructor reviews cardiac drug classes: angiotensin converting enzyme inhibitors (ACE-I), angiotensin II receptor blockers (ARBs), nitrates, and calcium channel blockers.  Instructor discusses reasons, side effects, and lifestyle considerations for each drug class.   Anatomy and Physiology of the Circulatory System:  -Group instruction provided by verbal instruction, video, and written materials to support subject.  Reviews functional anatomy of heart, how it relates to various diagnoses, and what role the heart plays in the overall system. Flowsheet Row CARDIAC REHAB PHASE II EXERCISE from 10/27/2016 in [place]  Date  10/11/16  Instruction Review Code  2- meets goals/outcomes      Knowledge Questionnaire Score:     Knowledge Questionnaire Score - 09/28/16 7412      Knowledge Questionnaire Score   Pre Score 22/24      Core Components/Risk Factors/Patient Goals at Admission:     Personal Goals and Risk Factors at Admission - 09/28/16 0920      Core  Components/Risk Factors/Patient Goals on Admission    Weight Management Yes   Intervention Weight Management: Develop a combined nutrition and exercise program designed to reach desired caloric intake, while maintaining appropriate intake of nutrient and fiber, sodium and fats, and appropriate energy expenditure required for the weight goal.;Weight Management: Provide education and appropriate resources to help participant work on and attain dietary goals.;Weight Management/Obesity: Establish reasonable short term and long term weight goals.;Obesity: Provide education and appropriate resources to help participant work on and attain dietary goals.   Expected Outcomes Long Term: Adherence to nutrition and physical activity/exercise program aimed toward attainment of established weight goal;Short Term: Continue to assess and modify interventions until short term weight is achieved;Weight Maintenance: Understanding of the daily nutrition guidelines, which includes 25-35% calories from fat, 7% or less cal from saturated fats, less than '200mg'$  cholesterol, less than 1.5gm of sodium, & 5 or more servings of fruits and vegetables daily;Weight Loss: Understanding of general recommendations for a balanced deficit meal plan, which promotes 1-2 lb weight loss per week and includes a negative energy balance of [phone] kcal/d;Understanding recommendations for meals to include 15-35% energy as protein, 25-35% energy from fat, 35-60% energy from carbohydrates, less than '200mg'$  of dietary cholesterol, 20-35 gm of total fiber daily;Understanding of distribution of calorie intake throughout the day with the consumption of 4-5 meals/snacks   Increase Strength and Stamina Yes   Intervention Provide advice, education, support and counseling about physical activity/exercise needs.;Develop an individualized exercise prescription for aerobic and resistive training based on initial evaluation findings, risk stratification, comorbidities  and participant's personal goals.   Expected Outcomes Achievement of increased cardiorespiratory fitness and enhanced flexibility, muscular endurance and strength shown through measurements of functional capacity and personal statement of participant.   Improve shortness of breath with ADL's Yes   Intervention Provide education, individualized exercise plan and daily activity instruction to help decrease symptoms of SOB with activities of daily living.   Expected Outcomes Short Term: Achieves a reduction of symptoms when performing activities of daily living.   Personal  Goal Other Yes   Personal Goal Learn activity restrictions/limitations. Be able to exert self in confidence and exercise safely. Be able to split wood, transfer and carry, and use a push mower.   Intervention Provide education on exercise programming to assist with increasing strength and exercise tolerance    Expected Outcomes Pt will be able to use a push mower and other tools needed to split wood and learn activity limitations to be able to perform tasks in confidence.      Core Components/Risk Factors/Patient Goals Review:      Goals and Risk Factor Review    Row Name 10/25/16 1211 11/20/16 1347           Core Components/Risk Factors/Patient Goals Review   Personal Goals Review Increase Strength and Stamina;Other Increase Strength and Stamina;Other      Review pt states he has more energy and he is tolerating WL increases well.  pt states he has more energy and he is tolerating WL increases well.  pt is also walking 3 days a week at home for 20 min.      Expected Outcomes Develop a regular exercise routine and increase overall cardiorespiratory fitness Continue HEP and attendance to CRPII in order to increaes overall cardiorespiratory fitness         Core Components/Risk Factors/Patient Goals at Discharge (Final Review):      Goals and Risk Factor Review - 11/20/16 1347      Core Components/Risk Factors/Patient  Goals Review   Personal Goals Review Increase Strength and Stamina;Other   Review pt states he has more energy and he is tolerating WL increases well.  pt is also walking 3 days a week at home for 20 min.   Expected Outcomes Continue HEP and attendance to CRPII in order to increaes overall cardiorespiratory fitness      ITP Comments:     ITP Comments    Row Name 09/28/16 0918 10/20/16 1235         ITP Comments Dr. Fransico Him, Medical Director attended CPR education class.  outcomes and goals met          Comments: Timothy Escobar  is making expected progress toward personal goals after completing 15 sessions. Recommend continued exercise and life style modification education including  stress management and relaxation techniques to decrease cardiac risk profile. Timothy Escobar has been out this week with the flu.Barnet Pall, RN,BSN 11/22/2016 6:35 PM

## 2016-11-21 NOTE — Progress Notes (Signed)
   Subjective:    Patient ID: Timothy Escobar, male    DOB: 05-03-1941, 76 y.o.   MRN: [id number]  HPI Complains of productive cough that started last week.  He noted improvement after a few days, but now he is having a cough that will not go away.  Nonproductive.  No fevers/chills.  Denies earache, sore throat, rash.  Has nasal congestions.  His wife has the flu he believes.  Denies myalgias, chest pain, shortness of breath.      Review of Systems  Constitutional: Negative for chills, fatigue and fever.  HENT: Positive for congestion and postnasal drip. Negative for ear pain and sore throat.   Eyes: Negative for pain and itching.  Respiratory: Positive for cough. Negative for chest tightness, shortness of breath and wheezing.   Cardiovascular: Negative for chest pain and leg swelling.  Gastrointestinal: Negative for abdominal pain, diarrhea and nausea.  Genitourinary: Negative for dysuria and urgency.  Musculoskeletal: Negative for joint swelling and myalgias.  Skin: Negative for pallor, rash and wound.  Neurological: Negative for dizziness and weakness.  All other systems reviewed and are negative.      Objective:   Physical Exam  Constitutional: He is oriented to person, place, and time. He appears well-developed and well-nourished. No distress.  HENT:  Head: Normocephalic and atraumatic.  Right Ear: External ear normal.  Left Ear: External ear normal.  Mouth/Throat: Oropharynx is clear and moist. No oropharyngeal exudate.  Bilateral edematous nasal turbinates without erythema.  Clear discharge  Eyes: Conjunctivae are normal. No scleral icterus.  Neck: Normal range of motion. Neck supple.  Cardiovascular: Normal rate and regular rhythm.  Exam reveals no gallop and no friction rub.   No murmur heard. Pulmonary/Chest: Effort normal and breath sounds normal. No respiratory distress. He has no wheezes. He has no rales.  Musculoskeletal: He exhibits no edema or deformity.    Lymphadenopathy:    He has no cervical adenopathy.  Neurological: He is alert and oriented to person, place, and time.  Skin: Skin is warm. No rash noted. He is not diaphoretic. No erythema. No pallor.  Psychiatric: He has a normal mood and affect. His behavior is normal. Judgment and thought content normal.  Nursing note and vitals reviewed.  BP 128/80 (BP Location: Right Arm, Patient Position: Sitting, Cuff Size: Normal)   Pulse 63   Temp 98.5 F (36.9 C) (Oral)   Resp 17   Ht 5' 8.5" (1.74 m)   Wt 173 lb (78.5 kg)   SpO2 98%   BMI 25.92 kg/m         Assessment & Plan:  Acute bronchitis Recommend mucinex without DM.  Nasal saline spray.  Gave reassurance and supportive care instructions, follow up as needed.    Signed,  Balinda Quails, DO [facility] 4:58 PM 11/22/16

## 2016-11-21 NOTE — Patient Instructions (Addendum)
Recommend rest, fluids, mucinex without DM (dextromethorphan)  Follow up if not improved.    IF you received an x-ray today, you will receive an invoice from [facility]. Please contact [facility] at [phone] with questions or concerns regarding your invoice.   IF you received labwork today, you will receive an invoice from [facility]. Please contact [facility] at [phone] with questions or concerns regarding your invoice.   Our billing staff will not be able to assist you with questions regarding bills from these companies.  You will be contacted with the lab results as soon as they are available. The fastest way to get your results is to activate your My Chart account. Instructions are located on the last page of this paperwork. If you have not heard from [place] regarding the results in 2 weeks, please contact this office.

## 2016-11-21 NOTE — Assessment & Plan Note (Addendum)
Recommend mucinex without DM.  Nasal saline spray.  Gave reassurance and supportive care instructions, follow up as needed.

## 2016-11-22 ENCOUNTER — Encounter (HOSPITAL_COMMUNITY)
Admission: RE | Admit: 2016-11-22 | Discharge: 2016-11-22 | Disposition: A | Discharge status: Home / Self Care | Payer: Medicare Other | Source: Ambulatory Visit | Attending: Cardiovascular Disease | Admitting: Cardiovascular Disease

## 2016-11-24 ENCOUNTER — Encounter (HOSPITAL_COMMUNITY): Payer: Medicare Other

## 2016-11-27 ENCOUNTER — Encounter (HOSPITAL_COMMUNITY)
Admission: RE | Admit: 2016-11-27 | Discharge: 2016-11-27 | Disposition: A | Discharge status: Home / Self Care | Payer: Medicare Other | Source: Ambulatory Visit | Attending: Cardiovascular Disease | Admitting: Cardiovascular Disease

## 2016-11-27 DIAGNOSIS — I2102 ST elevation (STEMI) myocardial infarction involving left anterior descending coronary artery: Secondary | ICD-10-CM

## 2016-11-27 NOTE — Progress Notes (Signed)
Timothy Escobar 76 y.o. male Nutrition Note Spoke with pt. Nutrition Survey reviewed with pt. Pt is following Step 2 of the Therapeutic Lifestyle Changes diet. Pt is a flexitarian; pt eats primarily vegetarian meals due to wife being vegetarian. Pt expressed understanding of the information reviewed. Pt is aware of nutrition education classes offered and declined to attend classes or receive class handouts at this time. Lab Results  Component Value Date   HGBA1C 5.5 08/23/2016   Wt Readings from Last 3 Encounters:  11/21/16 173 lb (78.5 kg)  09/28/16 173 lb 11.6 oz (78.8 kg)  08/29/16 177 lb (80.3 kg)   Nutrition Diagnosis ? Food-and nutrition-related knowledge deficit related to lack of exposure to information as related to diagnosis of: ? CVD ? Overweight related to excessive energy intake as evidenced by a BMI of 26.0 Nutrition Intervention ? Benefits of adopting Therapeutic Lifestyle Changes discussed when Medficts reviewed. ? Pt to attend the Portion Distortion class ? Continue client-centered nutrition education by RD, as part of interdisciplinary care.  Goal(s) ? Pt to identify food quantities necessary to achieve weight loss goal wt of 160-165 lb at graduation from cardiac rehab.   Monitor and Evaluate progress toward nutrition goal with team.  Derek Mound, M.Ed, RD, LDN, CDE 11/27/2016 11:39 AM

## 2016-11-29 ENCOUNTER — Encounter (HOSPITAL_COMMUNITY)
Admission: RE | Admit: 2016-11-29 | Discharge: 2016-11-29 | Disposition: A | Discharge status: Home / Self Care | Payer: Medicare Other | Source: Ambulatory Visit | Attending: Cardiovascular Disease | Admitting: Cardiovascular Disease

## 2016-11-29 DIAGNOSIS — I2102 ST elevation (STEMI) myocardial infarction involving left anterior descending coronary artery: Secondary | ICD-10-CM | POA: Diagnosis not present

## 2016-12-01 ENCOUNTER — Encounter (HOSPITAL_COMMUNITY)
Admission: RE | Admit: 2016-12-01 | Discharge: 2016-12-01 | Disposition: A | Discharge status: Home / Self Care | Payer: Medicare Other | Source: Ambulatory Visit | Attending: Cardiovascular Disease | Admitting: Cardiovascular Disease

## 2016-12-01 DIAGNOSIS — I2102 ST elevation (STEMI) myocardial infarction involving left anterior descending coronary artery: Secondary | ICD-10-CM

## 2016-12-04 ENCOUNTER — Encounter (HOSPITAL_COMMUNITY)
Admission: RE | Admit: 2016-12-04 | Discharge: 2016-12-04 | Disposition: A | Discharge status: Home / Self Care | Payer: Medicare Other | Source: Ambulatory Visit | Attending: Cardiovascular Disease | Admitting: Cardiovascular Disease

## 2016-12-04 DIAGNOSIS — I2102 ST elevation (STEMI) myocardial infarction involving left anterior descending coronary artery: Secondary | ICD-10-CM | POA: Diagnosis not present

## 2016-12-06 ENCOUNTER — Encounter (HOSPITAL_COMMUNITY)
Admission: RE | Admit: 2016-12-06 | Discharge: 2016-12-06 | Disposition: A | Discharge status: Home / Self Care | Payer: Medicare Other | Source: Ambulatory Visit | Attending: Cardiovascular Disease | Admitting: Cardiovascular Disease

## 2016-12-06 DIAGNOSIS — I2102 ST elevation (STEMI) myocardial infarction involving left anterior descending coronary artery: Secondary | ICD-10-CM

## 2016-12-07 ENCOUNTER — Encounter: Payer: Medicare Other | Admitting: Family Medicine

## 2016-12-08 ENCOUNTER — Ambulatory Visit (INDEPENDENT_AMBULATORY_CARE_PROVIDER_SITE_OTHER): Payer: Medicare Other | Admitting: Cardiovascular Disease

## 2016-12-08 ENCOUNTER — Encounter: Payer: Self-pay | Admitting: Cardiovascular Disease

## 2016-12-08 ENCOUNTER — Encounter (HOSPITAL_COMMUNITY): Admission: RE | Admit: 2016-12-08 | Payer: Medicare Other | Source: Ambulatory Visit

## 2016-12-08 VITALS — BP 118/69 | HR 53 | Ht 68.0 in | Wt 170.0 lb

## 2016-12-08 DIAGNOSIS — E785 Hyperlipidemia, unspecified: Secondary | ICD-10-CM | POA: Diagnosis not present

## 2016-12-08 DIAGNOSIS — I25118 Atherosclerotic heart disease of native coronary artery with other forms of angina pectoris: Secondary | ICD-10-CM | POA: Diagnosis not present

## 2016-12-08 DIAGNOSIS — I2109 ST elevation (STEMI) myocardial infarction involving other coronary artery of anterior wall: Secondary | ICD-10-CM | POA: Diagnosis not present

## 2016-12-08 NOTE — Patient Instructions (Signed)
Your physician recommends that you schedule a follow-up appointment in: 4 months with Dr kelly.  

## 2016-12-08 NOTE — Progress Notes (Signed)
Cardiology Office Note    Date:  12/10/2016   ID:  Timothy Escobar, Timothy Escobar 11/09/1940, MRN [id number]  PCP:  Reginia Forts, MD  Cardiologist:  Shelva Majestic, MD   Initial F/U post Anterior MI  History of Present Illness:  Timothy Escobar is a 76 y.o. male who sufferred an Anterior STEMI on 08/22/16 who presents for F/u evaluation.   Timothy Escobar is a 76 year old Caucasian male who was without cardiac history and developed acute onset of severe substernal chest pressure while he was moving logs in a wheelbarrow which commenced at approximately 5 PM on 08/22/16. EMS arrived within 15 minutes. His ECG in transient showed 11 mm of ST segment elevation in leads V3 through V6 consistent with an acute anterior injury current. A code STEMI was activated and he was brought emergently directly to the catheterization laboratory where I performed emergent catheterization. Catheterization revealed total occlusion of a large proximal LAD with TIMI 0 flow and a large dominant normal left circumflex vessel and small nondominant RCA. He underwent successful PCI to the LAD, treated with PTCA/DES stenting at the site of 100% occlusion with initial insertion of a 3.016 mm Synergy DES stent.  Once  TIMI-3 flow was established it was evident that there was segmental disease beyond the stented segment of 70 and 80% and tandem stenting with a 2.7524 mm Synergy DES stent was done with post stent dilatation from 3.23 mm in the proximal portion of the proximal stent to 3.05 mm at the distal portion of the distal stent. LVEDP was 13 mm Hg and the door to balloon time from arrival was 19 minutes. DTB from chest pain onset prior to EMS arrival was < 1 hour.  He ultimately completely resolved his 11 mm ST elevation, ECG normalized, and subsequent echo showed completely normal LV fx with EF 60-65% without any wall motion abnormality! He was discharged 2 days later.  Since his event he has remained asymptomatic. He is  participating in cardiac rehab. He continues to sing in the [employer] He was seen by Angelica Ran, NP on 08/29/2016.  He has not been having any chest pain or dyspnea. He denies PND, orthopnea, dizziness, syncope, edema, or early satiety. He is tolerating his medications. He presents forhis initial  f/u evaluation with me.   Past Medical History:  Diagnosis Date  . Blood transfusion without reported diagnosis   . CAD (coronary artery disease)    a. 08/22/2016 Ant STEMI/PCI: LM nl, LAD 30ost, 100p (3.0x15 Synergy DES & 2.75x24 Synergy DES), LCX large/dominant/nl, RCA nondom, nl;  b. 08/2016 Echo: Ef 60-65%, no rwma.  . Depression   . Dyslipidemia   . Skin cancer    skin cancer    Past Surgical History:  Procedure Laterality Date  . APPENDECTOMY    . CARDIAC CATHETERIZATION N/A 08/22/2016   Procedure: Left Heart Cath and Coronary Angiography;  Surgeon: Troy Sine, MD;  Location: [facility];  Service: Cardiovascular;  Laterality: N/A;  . CARDIAC CATHETERIZATION N/A 08/22/2016   Procedure: Coronary Stent Intervention;  Surgeon: Troy Sine, MD;  Location: [facility];  Service: Cardiovascular;  Laterality: N/A;  MID LAD  . HERNIA REPAIR    . vocal fold polyp removed      Current Medications: Outpatient Medications Prior to Visit  Medication Sig Dispense Refill  . acetaminophen (TYLENOL) 500 MG tablet Take 500 mg by mouth every 6 (six) hours as needed for pain or headache.    Marland Kitchen  aspirin EC 81 MG EC tablet Take 1 tablet (81 mg total) by mouth daily. 90 tablet 2  . atorvastatin (LIPITOR) 80 MG tablet Take 1 tablet (80 mg total) by mouth daily at 6 PM. 90 tablet 2  . calcium-vitamin D (OSCAL WITH D) 500-200 MG-UNIT per tablet Take 1 tablet by mouth daily. 1,000 of Vit D3    . clobetasol ointment (TEMOVATE) 0.05 % Apply 1 application topically 2 (two) times daily as needed.     Marland Kitchen FLUoxetine (PROZAC) 20 MG capsule Take 1 capsule (20 mg total) by mouth daily. 90  capsule 3  . Ginger Root POWD by Does not apply route. Mixes 1/2 teaspoon of powder into hot tea once daily    . lisinopril (PRINIVIL,ZESTRIL) 2.5 MG tablet Take 1 tablet (2.5 mg total) by mouth daily. 90 tablet 2  . Multiple Vitamins-Minerals (MULTIVITAMIN WITH MINERALS) tablet Take 1 tablet by mouth once a week. Centrum Silver     . nitroGLYCERIN (NITROSTAT) 0.4 MG SL tablet Place 1 tablet (0.4 mg total) under the tongue every 5 (five) minutes x 3 doses as needed for chest pain. 30 tablet 12  . SUMAtriptan (IMITREX) 100 MG tablet TAKE AS DIRECTED. (Patient taking differently: Take 100 mg by mouth daily as needed. TAKE AS DIRECTED.) 12 tablet 3  . ticagrelor (BRILINTA) 90 MG TABS tablet Take 1 tablet (90 mg total) by mouth 2 (two) times daily. 180 tablet 2  . vitamin C (ASCORBIC ACID) 500 MG tablet Take 500 mg by mouth daily.    Marland Kitchen VITAMIN D, ERGOCALCIFEROL, PO Take 1,000 Units by mouth.     No facility-administered medications prior to visit.      Allergies:   Patient has no known allergies.   Social History   Social History  . Marital status: Married    Spouse name: N/A  . Number of children: 2  . Years of education: college   Occupational History  . Retired - Naval architect    Social History Main Topics  . Smoking status: Never Smoker  . Smokeless tobacco: Never Used  . Alcohol use 3.5 oz/week    7 Standard drinks or equivalent per week     Comment: wine: 1 glass daily  . Drug use: No  . Sexual activity: No   Other Topics Concern  . None   Social History Narrative   Lives w/ wife in [place]. 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   Exercise walk/gardening daily for 1 hr plus     Family History:  The patient's family history includes Cancer in his brother, brother, father, paternal grandmother, sister, and sister; Dementia in his mother; Heart disease in his brother, maternal grandmother, and  sister; Hypertension in his sister; Stroke in his maternal grandmother.   ROS General: Negative; No fevers, chills, or night sweats;  HEENT: Negative; No changes in vision or hearing, sinus congestion, difficulty swallowing Pulmonary: Negative; No cough, wheezing, shortness of breath, hemoptysis Cardiovascular: Negative; No chest pain, presyncope, syncope, palpitations GI: Negative; No nausea, vomiting, diarrhea, or abdominal pain GU: Negative; No dysuria, hematuria, or difficulty voiding Musculoskeletal: Negative; no myalgias, joint pain, or weakness Hematologic/Oncology: Negative; no easy bruising, bleeding Endocrine: Negative; no heat/cold intolerance; no diabetes Neuro: Negative; no changes in balance, headaches Skin: Negative; No rashes or skin lesions Psychiatric: Negative; No behavioral problems, depression Sleep: Negative; No snoring, daytime sleepiness, hypersomnolence, bruxism, restless legs, hypnogognic hallucinations, no  cataplexy Other comprehensive 14 point system review is negative.   PHYSICAL EXAM:   VS:  BP 118/69 (BP Location: Right Arm)   Pulse (!) 53   Ht '5\' 8"'$  (1.727 m)   Wt 170 lb (77.1 kg)   BMI 25.85 kg/m    Wt Readings from Last 3 Encounters:  12/08/16 170 lb (77.1 kg)  11/21/16 173 lb (78.5 kg)  09/28/16 173 lb 11.6 oz (78.8 kg)    General: Alert, oriented, no distress.  Skin: normal turgor, no rashes, warm and dry HEENT: Normocephalic, atraumatic. Pupils equal round and reactive to light; sclera anicteric; extraocular muscles intact; Nose without nasal septal hypertrophy Mouth/Parynx benign; Mallinpatti scale 3 Neck: No JVD, no carotid bruits; normal carotid upstroke Lungs: clear to ausculatation and percussion; no wheezing or rales Chest wall: without tenderness to palpitation Heart: PMI not displaced, RRR, s1 s2 normal, faint 1/6 systolic murmur, no diastolic murmur, no rubs, gallops, thrills, or heaves Abdomen: soft, nontender; no  hepatosplenomehaly, BS+; abdominal aorta nontender and not dilated by palpation. Back: no CVA tenderness Pulses 2+ Musculoskeletal: full range of motion, normal strength, no joint deformities Extremities: no clubbing cyanosis or edema, Homan's sign negative  Neurologic: grossly nonfocal; Cranial nerves grossly wnl Psychologic: Normal mood and affect   Studies/Labs Reviewed:   EKG:  EKG is ordered today.  ECG (independently read by me): Sinus bradycardia 53 bpm.  Normal ECG.  Early repolarization.  No evidence for prior infarction.  Recent Labs: BMP Latest Ref Rng & Units 09/30/2016 08/24/2016 08/23/2016  Glucose 65 - 99 mg/dL 107(H) 99 105(H)  BUN 6 - 20 mg/dL '19 13 10  '$ Creatinine 0.61 - 1.24 mg/dL 0.86 0.84 0.77  Sodium 135 - 145 mmol/L 138 141 139  Potassium 3.5 - 5.1 mmol/L 3.9 4.4 4.1  Chloride 101 - 111 mmol/L 105 111 109  CO2 22 - 32 mmol/L '24 24 23  '$ Calcium 8.9 - 10.3 mg/dL 8.9 8.6(L) 8.3(L)     Hepatic Function Latest Ref Rng & Units 10/10/2016 08/22/2016 10/07/2015  Total Protein 6.1 - 8.1 g/dL 6.2 6.3(L) 6.6  Albumin 3.6 - 5.1 g/dL 4.1 4.0 4.1  AST 10 - 35 U/L '22 31 23  '$ ALT 9 - 46 U/L '23 22 21  '$ Alk Phosphatase 40 - 115 U/L 61 73 74  Total Bilirubin 0.2 - 1.2 mg/dL 0.7 0.5 0.7  Bilirubin, Direct <=0.2 mg/dL 0.2 - -    CBC Latest Ref Rng & Units 09/30/2016 08/24/2016 08/23/2016  WBC 4.0 - 10.5 K/uL 6.7 7.5 5.8  Hemoglobin 13.0 - 17.0 g/dL 15.3 13.2 12.6(L)  Hematocrit 39.0 - 52.0 % 44.6 39.3 36.4(L)  Platelets 150 - 400 K/uL 183 172 180   Lab Results  Component Value Date   MCV 92.9 09/30/2016   MCV 93.6 08/24/2016   MCV 90.8 08/23/2016   Lab Results  Component Value Date   TSH 1.447 08/22/2016   Lab Results  Component Value Date   HGBA1C 5.5 08/23/2016     BNP    Component Value Date/Time   BNP 164.0 (H) 08/23/2016 0200    ProBNP No results found for: PROBNP   Lipid Panel     Component Value Date/Time   CHOL 107 10/10/2016 0951   TRIG 52  10/10/2016 0951   HDL 46 10/10/2016 0951   CHOLHDL 2.3 10/10/2016 0951   VLDL 10 10/10/2016 0951   LDLCALC 51 10/10/2016 0951     RADIOLOGY: No results found.   Additional studies/ records that  were reviewed today include:  I reviewed his initial hospitalization, cath, echo,  f/u visit with C Berge,NP, and Cardiac Rehab.    ASSESSMENT:    1. Coronary artery disease involving native coronary artery of native heart with other form of angina pectoris ([place])   2. ST elevation myocardial infarction (STEMI) of anterior wall, subsequent episode of care ([place])   3. Hyperlipidemia with target LDL less than 70      PLAN:  Mr Shein develeoped an ACS/Anterior STEMI with 11 mm ST elevation precordially on Halloween 2017. Fortunately he acted quickly and was prompty brought to [facility] cath lab where I performed emergent cath/PCI and had an DTB time of 19 minutes from arrival to the lab and his chest pain duration was less than 60 minutes. Due to disease beyond the initial occlusion he underwent tandem DES stenting with an excellent result.  He has been demonstrated to have complete myocardial salvage and his ECG is normal.  He is on low dose ACE-I with lisinopril post mI, and remains on DAPT with AS/Brilinta. He is on high potency statin with atorvastatin 80 mg daily and LDL is excellent at 51.  BP is stable. He remains asymptomatic with angina or dyspnea, or palpitation. He is participating in [place] and I have reviewed his sessions.  He will continue with current therapy.  I wil see him in 4 months for re-evaluation.    Medication Adjustments/Labs and Tests Ordered: Current medicines are reviewed at length with the patient today.  Concerns regarding medicines are outlined above.  Medication changes, Labs and Tests ordered today are listed in the Patient Instructions below. Patient Instructions  Your physician recommends that you schedule a follow-up appointment in: 4 months with Dr Claiborne Billings.       Signed, Shelva Majestic, MD , [facility] 12/10/2016 3:02 PM    [facility] [address], [place], [place], [place]  [address] Phone: [phone]

## 2016-12-11 ENCOUNTER — Encounter (HOSPITAL_COMMUNITY)
Admission: RE | Admit: 2016-12-11 | Discharge: 2016-12-11 | Disposition: A | Discharge status: Home / Self Care | Payer: Medicare Other | Source: Ambulatory Visit | Attending: Cardiovascular Disease | Admitting: Cardiovascular Disease

## 2016-12-11 DIAGNOSIS — I2102 ST elevation (STEMI) myocardial infarction involving left anterior descending coronary artery: Secondary | ICD-10-CM

## 2016-12-13 ENCOUNTER — Encounter (HOSPITAL_COMMUNITY)
Admission: RE | Admit: 2016-12-13 | Discharge: 2016-12-13 | Disposition: A | Discharge status: Home / Self Care | Payer: Medicare Other | Source: Ambulatory Visit | Attending: Cardiovascular Disease | Admitting: Cardiovascular Disease

## 2016-12-13 DIAGNOSIS — I2102 ST elevation (STEMI) myocardial infarction involving left anterior descending coronary artery: Secondary | ICD-10-CM | POA: Diagnosis not present

## 2016-12-14 ENCOUNTER — Encounter: Payer: Self-pay | Admitting: Family Medicine

## 2016-12-14 ENCOUNTER — Ambulatory Visit (INDEPENDENT_AMBULATORY_CARE_PROVIDER_SITE_OTHER): Payer: Medicare Other | Admitting: Family Medicine

## 2016-12-14 VITALS — BP 120/76 | HR 67 | Temp 98.6°F | Resp 16 | Ht 68.0 in | Wt 167.4 lb

## 2016-12-14 DIAGNOSIS — H9193 Unspecified hearing loss, bilateral: Secondary | ICD-10-CM | POA: Diagnosis not present

## 2016-12-14 DIAGNOSIS — F32A Depression, unspecified: Secondary | ICD-10-CM

## 2016-12-14 DIAGNOSIS — Z0000 Encounter for general adult medical examination without abnormal findings: Secondary | ICD-10-CM | POA: Diagnosis not present

## 2016-12-14 DIAGNOSIS — Z125 Encounter for screening for malignant neoplasm of prostate: Secondary | ICD-10-CM

## 2016-12-14 DIAGNOSIS — I2510 Atherosclerotic heart disease of native coronary artery without angina pectoris: Secondary | ICD-10-CM | POA: Diagnosis not present

## 2016-12-14 DIAGNOSIS — F329 Major depressive disorder, single episode, unspecified: Secondary | ICD-10-CM

## 2016-12-14 NOTE — Progress Notes (Signed)
By signing my name below, I, Mesha Guinyard, attest that this documentation has been prepared under the direction and in the presence of Merri Ray, MD.  Electronically Signed: Verlee Monte, Medical Scribe. 12/14/16. 10:20 AM.  Subjective:    Patient ID: Timothy Escobar, male    DOB: 01-15-1941, 76 y.o.   MRN: [id number]  HPI Chief Complaint  Patient presents with  . Annual Exam    HPI Comments: Timothy Escobar is a 76 y.o. male with a PM who presents to the [facility] for his annual physical. Hx of multiple medical problems including CAD, HLD. PCP is SMITH,KRISTI, MD, but he saw me in Aug 2017 for possible physical at that time but it was too soon.  Depression: Takes prozac 20 mg QD. See last visit he was continues on same dose of prozac with refill in 1 year. Pt is compliant with prozac and states he's doing fine with them. Depression screen [facility] 2/9 12/14/2016 11/21/2016 06/08/2016 10/07/2015 10/07/2014  Decreased Interest 0 0 0 0 0  Down, Depressed, Hopeless 0 0 0 1 0  PHQ - 2 Score 0 0 0 1 0   CAD: He had an acute STEMI Oct 2017. Cardiologist is Dr. Claiborne Billings. He had a stent to his LAD and he is undergoing cardiac rehab. He last saw cardiology Dec 08, 2016. Plan for recheck in 4 months with cardiology. He was continued on ace inhibitor, high potency statin of lipitor 80 mg QD. Since his heart attack,  he hasn't had 1 migraine after having severe migraines regularly for 30 years. Denies chest pain, difficulty breathing, SOB, chest tightness, or other acute sxs.  Cancer Screening: Colon CA: Colonoscopy in 2009. Hyperplastic polyp - repeat in 10 years. Prostate CA: Denies experiencing acute sxs. Lab Results  Component Value Date   PSA 1.06 10/07/2014   PSA 0.78 09/26/2012   Immunizations: Immunization History  Administered Date(s) Administered  . Influenza,inj,Quad PF,36+ Mos 07/18/2013, 07/29/2014, 08/30/2015, 07/20/2016  . Influenza-Unspecified  07/23/2012  . Pneumococcal Polysaccharide-23 05/13/2008  . Td 05/13/2008  . Tdap 09/26/2012  . Zoster 10/23/2009   Vision/Hearing: Pt has not seen the ophthalmologist in the past year, but plans to schedule an appt soon.  Visual Acuity Screening   Right eye Left eye Both eyes  Without correction:     With correction: 20/20 20/20 20/15   Hearing Screening Comments: Whisper test at 5 ft. Normal  Dentist: Pt has all his natural teeth and he's followed by a dentist regularly  Acivity/Exercise: He is undergoing cardiac rehab.  Fall Screening: Fall Risk  12/14/2016 11/21/2016 09/28/2016 06/08/2016 10/07/2015  Falls in the past year? No No No No No   Functional Status Survey: Hearing Loss: Pt reports his wife states he has difficulty hearing her in crowded rooms. 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  Is the patient deaf or have difficulty hearing?: No Does the patient have difficulty seeing, even when wearing glasses/contacts?: No Does the patient have difficulty concentrating, remembering, or making decisions?: No Does the patient have difficulty walking or climbing stairs?: No Does the patient have difficulty dressing or bathing?: No Does the patient have difficulty doing errands alone such as visiting a doctor's office or shopping?: No  Advance Directives: Pt does have an advanced directive.  Patient Active Problem List   Diagnosis Date Noted  . Acute bronchitis 11/21/2016  . Dyslipidemia   .  CAD (coronary artery disease)   . Status post coronary artery stent placement   . Bradycardia   . Hypokalemia   . Hyperlipidemia with target LDL less than 70   . STEMI (ST elevation myocardial infarction) ([place]) 08/22/2016  . Acute ST elevation myocardial infarction (STEMI) involving left anterior descending (LAD) coronary artery ([place]) 08/22/2016  . Hip pain 09/28/2012  . Chronic depression 09/28/2012   Past Medical History:   Diagnosis Date  . Blood transfusion without reported diagnosis   . CAD (coronary artery disease)    a. 08/22/2016 Ant STEMI/PCI: LM nl, LAD 30ost, 100p (3.0x15 Synergy DES & 2.75x24 Synergy DES), LCX large/dominant/nl, RCA nondom, nl;  b. 08/2016 Echo: Ef 60-65%, no rwma.  . Depression   . Dyslipidemia   . Skin cancer    skin cancer   Past Surgical History:  Procedure Laterality Date  . APPENDECTOMY    . CARDIAC CATHETERIZATION N/A 08/22/2016   Procedure: Left Heart Cath and Coronary Angiography;  Surgeon: Troy Sine, MD;  Location: [place] [facility];  Service: Cardiovascular;  Laterality: N/A;  . CARDIAC CATHETERIZATION N/A 08/22/2016   Procedure: Coronary Stent Intervention;  Surgeon: Troy Sine, MD;  Location: [place] [facility];  Service: Cardiovascular;  Laterality: N/A;  MID LAD  . HERNIA REPAIR    . vocal fold polyp removed     No Known Allergies Prior to Admission medications   Medication Sig Start Date End Date Taking? Authorizing Provider  acetaminophen (TYLENOL) 500 MG tablet Take 500 mg by mouth every 6 (six) hours as needed for pain or headache.    Historical Provider, MD  aspirin EC 81 MG EC tablet Take 1 tablet (81 mg total) by mouth daily. 08/26/16   Maryellen Pile, MD  atorvastatin (LIPITOR) 80 MG tablet Take 1 tablet (80 mg total) by mouth daily at 6 PM. 08/25/16   Maryellen Pile, MD  calcium-vitamin D (OSCAL WITH D) 500-200 MG-UNIT per tablet Take 1 tablet by mouth daily. 1,000 of Vit D3    Historical Provider, MD  clobetasol ointment (TEMOVATE) [id number] % Apply 1 application topically 2 (two) times daily as needed.  08/30/15   Historical Provider, MD  FLUoxetine (PROZAC) 20 MG capsule Take 1 capsule (20 mg total) by mouth daily. 06/08/16   Wendie Agreste, MD  Ginger Root POWD by Does not apply route. Mixes 1/2 teaspoon of powder into hot tea once daily    Historical Provider, MD  lisinopril (PRINIVIL,ZESTRIL) 2.5 MG tablet Take 1 tablet (2.5 mg total) by mouth  daily. 08/26/16   Maryellen Pile, MD  Multiple Vitamins-Minerals (MULTIVITAMIN WITH MINERALS) tablet Take 1 tablet by mouth once a week. Centrum Silver     Historical Provider, MD  nitroGLYCERIN (NITROSTAT) 0.4 MG SL tablet Place 1 tablet (0.4 mg total) under the tongue every 5 (five) minutes x 3 doses as needed for chest pain. 08/25/16   Maryellen Pile, MD  SUMAtriptan (IMITREX) 100 MG tablet TAKE AS DIRECTED. Patient taking differently: Take 100 mg by mouth daily as needed. TAKE AS DIRECTED. 06/08/16   Wendie Agreste, MD  ticagrelor (BRILINTA) 90 MG TABS tablet Take 1 tablet (90 mg total) by mouth 2 (two) times daily. 08/25/16   Maryellen Pile, MD  vitamin C (ASCORBIC ACID) 500 MG tablet Take 500 mg by mouth daily.    Historical Provider, MD  VITAMIN D, ERGOCALCIFEROL, PO Take 1,000 Units by mouth.    Historical Provider, MD   Social History  Social History  . Marital status: Married    Spouse name: N/A  . Number of children: 2  . Years of education: college   Occupational History  . Retired - Water quality scientist    Social History Main Topics  . Smoking status: Never Smoker  . Smokeless tobacco: Never Used  . Alcohol use 3.5 oz/week    7 Standard drinks or equivalent per week     Comment: wine: 1 glass daily  . Drug use: No  . Sexual activity: No   Other Topics Concern  . Not on file   Social History Narrative   Lives w/ wife in [place]. 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, plus the church choir.   Exercise walk/gardening daily for 1 hr plus   Review of Systems  Hematological: Bruises/bleeds easily (pt is on blood thinners).  13 point ROS negative except for the above. Objective:  Physical Exam  Constitutional: He is oriented to person, place, and time. He appears well-developed and well-nourished.  HENT:  Head: Normocephalic and atraumatic.  Right Ear: External ear normal.  Left Ear: External ear normal.    Mouth/Throat: Oropharynx is clear and moist.  Eyes: Conjunctivae and EOM are normal. Pupils are equal, round, and reactive to light.  Neck: Normal range of motion. Neck supple. No thyromegaly present.  Cardiovascular: Normal rate, regular rhythm, normal heart sounds and intact distal pulses.   Pulmonary/Chest: Effort normal and breath sounds normal. No respiratory distress. He has no wheezes.  Abdominal: Soft. He exhibits no distension. There is no tenderness. Hernia confirmed negative in the right inguinal area and confirmed negative in the left inguinal area.  Genitourinary: Prostate normal.  Musculoskeletal: Normal range of motion. He exhibits no edema or tenderness.  Lymphadenopathy:    He has no cervical adenopathy.  Neurological: He is alert and oriented to person, place, and time. He has normal reflexes.  Skin: Skin is warm and dry.  Psychiatric: He has a normal mood and affect. His behavior is normal.  Vitals reviewed.   Vitals:   12/14/16 0928  BP: 120/76  Pulse: 67  Resp: 16  Temp: 98.6 F (37 C)  TempSrc: Oral  SpO2: 96%  Weight: 167 lb 6.4 oz (75.9 kg)  Height: 5\' 8"  (1.727 m)   Body mass index is 25.45 kg/m. Assessment & Plan:  Timothy Escobar is a 76 y.o. male Medicare annual wellness visit, subsequent  - -anticipatory guidance as below in AVS, screening labs above. Health maintenance items as above in HPI discussed/recommended as applicable.   Hearing difficulty of both ears - Plan: Ambulatory referral to Audiology  - Mild, noted incredible ribs. Age-related presbycusis possible, but will refer to audiology for further evaluation.  Depression, unspecified depression type  - Stable/controlled on current medication. Has been doing well in recovery from STEMI. Continue Prozac same dose.  Coronary artery disease involving native coronary artery of native heart without angina pectoris  - Status post STEMI with drug-eluting stent placed. Currently tolerating  cardiac rehabilitation, and has ongoing follow-up with cardiology. No changes in regimen at this time, asymptomatic.  -Of note his headaches have significantly improved, which may be in part due to beta blocker use.   Special screening, prostate cancer - Plan: PSA  - We discussed pros and cons of prostate cancer screening, and after this discussion, he chose to have screening done. PSA obtained, and no concerning findings on DRE.    No  orders of the defined types were placed in this encounter.  Patient Instructions    Schedule eye care provider appointment.   I will refer you to an audiologist for hearing testing.  Continue follow-up with cardiologist and cardiac rehabilitation. I'm glad to hear that you're doing well. Let me know if you have any questions.   Keeping you healthy  Get these tests  Blood pressure- Have your blood pressure checked once a year by your healthcare provider.  Normal blood pressure is 120/80  Weight- Have your body mass index (BMI) calculated to screen for obesity.  BMI is a measure of body fat based on height and weight. You can also calculate your own BMI at [email or web address].  Cholesterol- Have your cholesterol checked every year.  Diabetes- Have your blood sugar checked regularly if you have high blood pressure, high cholesterol, have a family history of diabetes or if you are overweight.  Screening for Colon Cancer- Colonoscopy starting at age 38.  Screening may begin sooner depending on your family history and other health conditions. Follow up colonoscopy as directed by your Gastroenterologist.  Screening for Prostate Cancer- Both blood work (PSA) and a rectal exam help screen for Prostate Cancer.  Screening begins at age 34 with African-American men and at age 5 with Caucasian men.  Screening may begin sooner depending on your family history.  Take these medicines  Aspirin- One aspirin daily can help prevent Heart disease and  Stroke.  Flu shot- Every fall.  Tetanus- Every 10 years.  Zostavax- Once after the age of 59 to prevent Shingles.  Pneumonia shot- Once after the age of 55; if you are younger than 67, ask your healthcare provider if you need a Pneumonia shot.  Take these steps  Don't smoke- If you do smoke, talk to your doctor about quitting.  For tips on how to quit, go to www.smokefree.gov or call 1-800-QUIT-NOW.  Be physically active- Exercise 5 days a week for at least 30 minutes.  If you are not already physically active start slow and gradually work up to 30 minutes of moderate physical activity.  Examples of moderate activity include walking briskly, mowing the yard, dancing, swimming, bicycling, etc.  Eat a healthy diet- Eat a variety of healthy food such as fruits, vegetables, low fat milk, low fat cheese, yogurt, lean meant, poultry, fish, beans, tofu, etc. For more information go to www.thenutritionsource.org  Drink alcohol in moderation- Limit alcohol intake to less than two drinks a day. Never drink and drive.  Dentist- Brush and floss twice daily; visit your dentist twice a year.  Depression- Your emotional health is as important as your physical health. If you're feeling down, or losing interest in things you would normally enjoy please talk to your healthcare provider.  Eye exam- Visit your eye doctor every year.  Safe sex- If you may be exposed to a sexually transmitted infection, use a condom.  Seat belts- Seat belts can save your life; always wear one.  Smoke/Carbon Monoxide detectors- These detectors need to be installed on the appropriate level of your home.  Replace batteries at least once a year.  Skin cancer- When out in the sun, cover up and use sunscreen 15 SPF or higher.  Violence- If anyone is threatening you, please tell your healthcare provider.  Living Will/ Health care power of attorney- Speak with your healthcare provider and family.   IF you received an x-ray  today, you will receive an invoice from [facility]. Please contact [facility]  Radiology at [phone] with questions or concerns regarding your invoice.   IF you received labwork today, you will receive an invoice from [place]. Please contact [facility] at [phone] with questions or concerns regarding your invoice.   Our billing staff will not be able to assist you with questions regarding bills from these companies.  You will be contacted with the lab results as soon as they are available. The fastest way to get your results is to activate your My Chart account. Instructions are located on the last page of this paperwork. If you have not heard from [place] regarding the results in 2 weeks, please contact this office.         I personally performed the services described in this documentation, which was scribed in my presence. The recorded information has been reviewed and considered for accuracy and completeness, addended by me as needed, and agree with information above.  Signed,   Merri Ray, MD [facility] [place].  12/15/16 12:37 PM

## 2016-12-14 NOTE — Patient Instructions (Addendum)
Schedule eye care provider appointment.   I will refer you to an audiologist for hearing testing.  Continue follow-up with cardiologist and cardiac rehabilitation. I'm glad to hear that you're doing well. Let me know if you have any questions.   Keeping you healthy  Get these tests  Blood pressure- Have your blood pressure checked once a year by your healthcare provider.  Normal blood pressure is 120/80  Weight- Have your body mass index (BMI) calculated to screen for obesity.  BMI is a measure of body fat based on height and weight. You can also calculate your own BMI at [email or web address].  Cholesterol- Have your cholesterol checked every year.  Diabetes- Have your blood sugar checked regularly if you have high blood pressure, high cholesterol, have a family history of diabetes or if you are overweight.  Screening for Colon Cancer- Colonoscopy starting at age 50.  Screening may begin sooner depending on your family history and other health conditions. Follow up colonoscopy as directed by your Gastroenterologist.  Screening for Prostate Cancer- Both blood work (PSA) and a rectal exam help screen for Prostate Cancer.  Screening begins at age 59 with African-American men and at age 46 with Caucasian men.  Screening may begin sooner depending on your family history.  Take these medicines  Aspirin- One aspirin daily can help prevent Heart disease and Stroke.  Flu shot- Every fall.  Tetanus- Every 10 years.  Zostavax- Once after the age of 37 to prevent Shingles.  Pneumonia shot- Once after the age of 10; if you are younger than 40, ask your healthcare provider if you need a Pneumonia shot.  Take these steps  Don't smoke- If you do smoke, talk to your doctor about quitting.  For tips on how to quit, go to www.smokefree.gov or call 1-800-QUIT-NOW.  Be physically active- Exercise 5 days a week for at least 30 minutes.  If you are not already physically active start slow and  gradually work up to 30 minutes of moderate physical activity.  Examples of moderate activity include walking briskly, mowing the yard, dancing, swimming, bicycling, etc.  Eat a healthy diet- Eat a variety of healthy food such as fruits, vegetables, low fat milk, low fat cheese, yogurt, lean meant, poultry, fish, beans, tofu, etc. For more information go to www.thenutritionsource.org  Drink alcohol in moderation- Limit alcohol intake to less than two drinks a day. Never drink and drive.  Dentist- Brush and floss twice daily; visit your dentist twice a year.  Depression- Your emotional health is as important as your physical health. If you're feeling down, or losing interest in things you would normally enjoy please talk to your healthcare provider.  Eye exam- Visit your eye doctor every year.  Safe sex- If you may be exposed to a sexually transmitted infection, use a condom.  Seat belts- Seat belts can save your life; always wear one.  Smoke/Carbon Monoxide detectors- These detectors need to be installed on the appropriate level of your home.  Replace batteries at least once a year.  Skin cancer- When out in the sun, cover up and use sunscreen 15 SPF or higher.  Violence- If anyone is threatening you, please tell your healthcare provider.  Living Will/ Health care power of attorney- Speak with your healthcare provider and family.   IF you received an x-ray today, you will receive an invoice from [facility]. Please contact [facility] Rad[facility] at [phone] with questions or concerns regarding your invoice.   IF you received labwork today,  you will receive an invoice from [place]. Please contact [facility] at [phone] with questions or concerns regarding your invoice.   Our billing staff will not be able to assist you with questions regarding bills from these companies.  You will be contacted with the lab results as soon as they are available. The fastest way to get your  results is to activate your My Chart account. Instructions are located on the last page of this paperwork. If you have not heard from [place] regarding the results in 2 weeks, please contact this office.

## 2016-12-15 ENCOUNTER — Encounter (HOSPITAL_COMMUNITY)
Admission: RE | Admit: 2016-12-15 | Discharge: 2016-12-15 | Disposition: A | Discharge status: Home / Self Care | Payer: Medicare Other | Source: Ambulatory Visit | Attending: Cardiovascular Disease | Admitting: Cardiovascular Disease

## 2016-12-15 DIAGNOSIS — I2102 ST elevation (STEMI) myocardial infarction involving left anterior descending coronary artery: Secondary | ICD-10-CM

## 2016-12-15 LAB — PSA: PROSTATE SPECIFIC AG, SERUM: 0.8 ng/mL (ref 0.0–4.0)

## 2016-12-18 ENCOUNTER — Encounter (HOSPITAL_COMMUNITY)
Admission: RE | Admit: 2016-12-18 | Discharge: 2016-12-18 | Disposition: A | Discharge status: Home / Self Care | Payer: Medicare Other | Source: Ambulatory Visit | Attending: Cardiovascular Disease | Admitting: Cardiovascular Disease

## 2016-12-18 DIAGNOSIS — I2102 ST elevation (STEMI) myocardial infarction involving left anterior descending coronary artery: Secondary | ICD-10-CM

## 2016-12-20 ENCOUNTER — Encounter (HOSPITAL_COMMUNITY)
Admission: RE | Admit: 2016-12-20 | Discharge: 2016-12-20 | Disposition: A | Discharge status: Home / Self Care | Payer: Medicare Other | Source: Ambulatory Visit | Attending: Cardiovascular Disease | Admitting: Cardiovascular Disease

## 2016-12-20 ENCOUNTER — Encounter (HOSPITAL_COMMUNITY): Payer: Medicare Other

## 2016-12-20 DIAGNOSIS — I2102 ST elevation (STEMI) myocardial infarction involving left anterior descending coronary artery: Secondary | ICD-10-CM | POA: Diagnosis not present

## 2016-12-20 NOTE — Progress Notes (Signed)
Cardiac Individual Treatment Plan  Patient Details  Name: Timothy Escobar MRN: [id number] Date of Birth: 1941/08/22 Referring Provider:   Flowsheet Row CARDIAC REHAB PHASE II ORIENTATION from 09/28/2016 in [place]  Referring Provider  Shelva Majestic MD      Initial Encounter Date:  [place] PHASE II ORIENTATION from 09/28/2016 in [place]  Date  09/28/16  Referring Provider  Shelva Majestic MD      Visit Diagnosis: 08/22/16 ST elevation myocardial infarction involving left anterior descending (LAD) coronary artery ([place])  Patient's Home Medications on Admission:  Current Outpatient Prescriptions:  .  acetaminophen (TYLENOL) 500 MG tablet, Take 500 mg by mouth every 6 (six) hours as needed for pain or headache., Disp: , Rfl:  .  aspirin EC 81 MG EC tablet, Take 1 tablet (81 mg total) by mouth daily., Disp: 90 tablet, Rfl: 2 .  atorvastatin (LIPITOR) 80 MG tablet, Take 1 tablet (80 mg total) by mouth daily at 6 PM., Disp: 90 tablet, Rfl: 2 .  calcium-vitamin D (OSCAL WITH D) 500-200 MG-UNIT per tablet, Take 1 tablet by mouth daily. 1,000 of Vit D3, Disp: , Rfl:  .  clobetasol ointment (TEMOVATE) 8.30 %, Apply 1 application topically 2 (two) times daily as needed. , Disp: , Rfl:  .  FLUoxetine (PROZAC) 20 MG capsule, Take 1 capsule (20 mg total) by mouth daily., Disp: 90 capsule, Rfl: 3 .  Ginger Root POWD, by Does not apply route. Mixes 1/2 teaspoon of powder into hot tea once daily, Disp: , Rfl:  .  lisinopril (PRINIVIL,ZESTRIL) 2.5 MG tablet, Take 1 tablet (2.5 mg total) by mouth daily., Disp: 90 tablet, Rfl: 2 .  Multiple Vitamins-Minerals (MULTIVITAMIN WITH MINERALS) tablet, Take 1 tablet by mouth once a week. Centrum Silver , Disp: , Rfl:  .  nitroGLYCERIN (NITROSTAT) 0.4 MG SL tablet, Place 1 tablet (0.4 mg total) under the tongue every 5 (five) minutes x 3 doses as needed for chest pain., Disp: 30  tablet, Rfl: 12 .  ticagrelor (BRILINTA) 90 MG TABS tablet, Take 1 tablet (90 mg total) by mouth 2 (two) times daily., Disp: 180 tablet, Rfl: 2 .  vitamin C (ASCORBIC ACID) 500 MG tablet, Take 500 mg by mouth daily., Disp: , Rfl:  .  VITAMIN D, ERGOCALCIFEROL, PO, Take 1,000 Units by mouth., Disp: , Rfl:   Past Medical History: Past Medical History:  Diagnosis Date  . Blood transfusion without reported diagnosis   . CAD (coronary artery disease)    a. 08/22/2016 Ant STEMI/PCI: LM nl, LAD 30ost, 100p (3.0x15 Synergy DES & 2.75x24 Synergy DES), LCX large/dominant/nl, RCA nondom, nl;  b. 08/2016 Echo: Ef 60-65%, no rwma.  . Depression   . Dyslipidemia   . Skin cancer    skin cancer    Tobacco Use: History  Smoking Status  . Never Smoker  Smokeless Tobacco  . Never Used    Labs: Recent Review Flowsheet Data    Labs for ITP Cardiac and Pulmonary Rehab Latest Ref Rng & Units 09/26/2012 10/07/2014 08/23/2016 10/10/2016   Cholestrol <200 mg/dL 173 202(H) 175 107   LDLCALC <100 mg/dL 98 124(H) 108(H) 51   HDL >40 mg/dL 63 63 59 46   Trlycerides <150 mg/dL 62 76 42 52   Hemoglobin A1c 4.8 - 5.6 % - - 5.5 -      Capillary Blood Glucose: No results found for: GLUCAP   Exercise Target Goals:  Exercise Program Goal: Individual exercise prescription set with THRR, safety & activity barriers. Participant demonstrates ability to understand and report RPE using BORG scale, to self-measure pulse accurately, and to acknowledge the importance of the exercise prescription.  Exercise Prescription Goal: Starting with aerobic activity 30 plus minutes a day, 3 days per week for initial exercise prescription. Provide home exercise prescription and guidelines that participant acknowledges understanding prior to discharge.  Activity Barriers & Risk Stratification:     Activity Barriers & Cardiac Risk Stratification - 09/28/16 1230      Activity Barriers & Cardiac Risk Stratification    Activity Barriers None      6 Minute Walk:     6 Minute Walk    Row Name 09/28/16 0927 09/28/16 1230       6 Minute Walk   Phase Initial  -    Distance 2303 feet  -    Walk Time 6 minutes  -    # of Rest Breaks 0  -    MPH  - 4.4    METS  - 5.3    RPE 12 12    VO2 Peak  - 18.4    Symptoms No  -    Resting HR 70 bpm  -    Resting BP 132/76  -    Max Ex. HR 122 bpm  -    Max Ex. BP 178/82  -    2 Minute Post BP 144/82  -       Oxygen Initial Assessment:   Oxygen Re-Evaluation:   Oxygen Discharge (Final Oxygen Re-Evaluation):   Initial Exercise Prescription:     Initial Exercise Prescription - 09/28/16 1200      Date of Initial Exercise RX and Referring Provider   Date 09/28/16   Referring Provider Shelva Majestic MD     Treadmill   MPH 3.2   Grade 2   Minutes 10   METs 4.3     Bike   Level 1.2   Minutes 10   METs 3.83     NuStep   Level 3   Minutes 10   METs 2     Prescription Details   Frequency (times per week) 3     Intensity   THRR 40-80% of Max Heartrate 59-118   Ratings of Perceived Exertion 11-13     Progression   Progression Continue progressive overload as per policy without signs/symptoms or physical distress.     Resistance Training   Training Prescription Yes   Weight 3lbs   Reps 10-12      Perform Capillary Blood Glucose checks as needed.  Exercise Prescription Changes:      Exercise Prescription Changes    Row Name 10/25/16 1200 11/20/16 1300 12/18/16 1400         Response to Exercise   Blood Pressure (Admit) 124/70 122/60 108/70     Blood Pressure (Exercise) 148/70 130/80 122/80     Blood Pressure (Exit) 114/78 112/64 108/74     Heart Rate (Admit) 78 bpm 59 bpm 67 bpm     Heart Rate (Exercise) 106 bpm 112 bpm 118 bpm     Heart Rate (Exit) 72 bpm 72 bpm 66 bpm     Rating of Perceived Exertion (Exercise) _0 Duration Progress to 45 minutes of aerobic exercise without signs/symptoms of physical  distress Progress to 45 minutes of aerobic exercise without signs/symptoms of physical distress Progress to 45 minutes of aerobic  exercise without signs/symptoms of physical distress     Intensity THRR unchanged THRR unchanged THRR unchanged       Progression   Progression Continue to progress workloads to maintain intensity without signs/symptoms of physical distress. Continue to progress workloads to maintain intensity without signs/symptoms of physical distress. Continue to progress workloads to maintain intensity without signs/symptoms of physical distress.     Average METs 3.7 4.9 5       Resistance Training   Training Prescription Yes Yes Yes     Weight 5lbs 6lb 6lb     Reps 10-12 10-12 10-15       Treadmill   MPH 2.5 2.8 2.8     Grade _0 Minutes _1 METs 3.6 4.7 4.7       Bike   Level 1.2 1.8 2     Minutes _2 METs 3.9 5.4 5.9       NuStep   Level _3 Minutes _4 METs 3.6 4.5 4.6       Home Exercise Plan   Plans to continue exercise at [place]  -     Frequency Add 2 additional days to program exercise sessions. Add 2 additional days to program exercise sessions. Add 2 additional days to program exercise sessions.       Exercise Review   Progression Yes Yes Yes        Exercise Comments:      Exercise Comments    Row Name 10/25/16 1210 11/20/16 1347 12/20/16 1659       Exercise Comments Reviewed METs and goals with pt.  Pt is doing well with exercise  Reviewed METs and goals with pt.  Pt is doing well with exercise  Reviewed METs and goals with pt.  Pt is doing well with exercise         Exercise Goals and Review:      Exercise Goals    Row Name 12/20/16 1659             Exercise Goals   Increase Physical Activity Yes       Intervention Provide advice, education, support and counseling about physical activity/exercise needs.;Develop an individualized exercise prescription for aerobic and resistive training  based on initial evaluation findings, risk stratification, comorbidities and participant's personal goals.       Expected Outcomes Achievement of increased cardiorespiratory fitness and enhanced flexibility, muscular endurance and strength shown through measurements of functional capacity and personal statement of participant.       Increase Strength and Stamina Yes       Intervention Provide advice, education, support and counseling about physical activity/exercise needs.;Develop an individualized exercise prescription for aerobic and resistive training based on initial evaluation findings, risk stratification, comorbidities and participant's personal goals.       Expected Outcomes Achievement of increased cardiorespiratory fitness and enhanced flexibility, muscular endurance and strength shown through measurements of functional capacity and personal statement of participant.          Exercise Goals Re-Evaluation :     Exercise Goals Re-Evaluation    Row Name 12/18/16 1403             Exercise Goal Re-Evaluation   Exercise Goals Review Increase Strenth and Stamina;Increase Physical Activity       Comments Pt states that he is walking everyday  and stretching for 56mn-1hr.  He is doing great with exercise and tolerating workload increase well.         Expected Outcomes Continue with exercise routine at CR and HEP in order to continue to gain strength and increase overall cardiorespiratory fitness.             Discharge Exercise Prescription (Final Exercise Prescription Changes):     Exercise Prescription Changes - 12/18/16 1400      Response to Exercise   Blood Pressure (Admit) 108/70   Blood Pressure (Exercise) 122/80   Blood Pressure (Exit) 108/74   Heart Rate (Admit) 67 bpm   Heart Rate (Exercise) 118 bpm   Heart Rate (Exit) 66 bpm   Rating of Perceived Exertion (Exercise) 11   Duration Progress to 45 minutes of aerobic exercise without signs/symptoms of physical distress    Intensity THRR unchanged     Progression   Progression Continue to progress workloads to maintain intensity without signs/symptoms of physical distress.   Average METs 5     Resistance Training   Training Prescription Yes   Weight 6lb   Reps 10-15     Treadmill   MPH 2.8   Grade 4   Minutes 10   METs 4.7     Bike   Level 2   Minutes 10   METs 5.9     NuStep   Level 5   Minutes 10   METs 4.6     Home Exercise Plan   Frequency Add 2 additional days to program exercise sessions.     Exercise Review   Progression Yes      Nutrition:  Target Goals: Understanding of nutrition guidelines, daily intake of sodium <15089m cholesterol <20047mcalories 30% from fat and 7% or less from saturated fats, daily to have 5 or more servings of fruits and vegetables.  Biometrics:     Pre Biometrics - 09/28/16 1239      Pre Biometrics   % Body Fat 27.5 %       Nutrition Therapy Plan and Nutrition Goals:     Nutrition Therapy & Goals - 11/27/16 1144      Nutrition Therapy   Diet Therapeutic Lifestyle Changes     Personal Nutrition Goals   Nutrition Goal Weight loss of 1-2 lb/week to a wt loss goal of ~160-165 lb at graduation from rehab     [place] and counsel regarding individualized specific dietary modifications aiming towards targeted core components such as weight, hypertension, lipid management, diabetes, heart failure and other comorbidities.   Expected Outcomes Short Term Goal: Understand basic principles of dietary content, such as calories, fat, sodium, cholesterol and nutrients.;Long Term Goal: Adherence to prescribed nutrition plan.      Nutrition Discharge: Nutrition Scores:     Nutrition Assessments - 11/27/16 1134      MEDFICTS Scores   Pre Score 23      Nutrition Goals Re-Evaluation:   Nutrition Goals Re-Evaluation:   Nutrition Goals Discharge (Final Nutrition Goals  Re-Evaluation):   Psychosocial: Target Goals: Acknowledge presence or absence of significant depression and/or stress, maximize coping skills, provide positive support system. Participant is able to verbalize types and ability to use techniques and skills needed for reducing stress and depression.  Initial Review & Psychosocial Screening:     Initial Psych Review & Screening - 09/28/16 [id number]   Comments Brief  Psychosocial Assessment reveals no needs or further interventions needed     Barriers   Psychosocial barriers to participate in program There are no identifiable barriers or psychosocial needs.     Screening Interventions   Interventions Encouraged to exercise      Quality of Life Scores:     Quality of Life - 09/28/16 1240      Quality of Life Scores   Health/Function Pre 28.67 %   Socioeconomic Pre 28.57 %   Psych/Spiritual Pre 28.21 %   Family Pre 30 %   GLOBAL Pre 28.75 %      PHQ-9: Recent Review Flowsheet Data    Depression screen [facility] 2/9 12/14/2016 11/21/2016 06/08/2016 10/07/2015 10/07/2014   Decreased Interest 0 0 0 0 0   Down, Depressed, Hopeless 0 0 0 1 0   PHQ - 2 Score 0 0 0 1 0     Interpretation of Total Score  Total Score Depression Severity:  1-4 = Minimal depression, 5-9 = Mild depression, 10-14 = Moderate depression, 15-19 = Moderately severe depression, 20-27 = Severe depression   Psychosocial Evaluation and Intervention:   Psychosocial Re-Evaluation:     Psychosocial Re-Evaluation    [place] Name 10/24/16 1619 11/21/16 1140 12/20/16 0914 12/20/16 0919       Psychosocial Re-Evaluation   Current issues with  -  - None Identified History of Depression;None Identified    Comments  -  -  - Timothy Escobar has a history of depression. Timothy Escobar's depression is controlled on his presribed antidepressant    Interventions Encouraged to attend Cardiac Rehabilitation for the exercise Encouraged to attend Cardiac  Rehabilitation for the exercise Encouraged to attend Cardiac Rehabilitation for the exercise  -    Continue Psychosocial Services  No No No Follow up required  -       Psychosocial Discharge (Final Psychosocial Re-Evaluation):     Psychosocial Re-Evaluation - 12/20/16 0919      Psychosocial Re-Evaluation   Current issues with History of Depression;None Identified   Comments Timothy Escobar has a history of depression. Timothy Escobar's depression is controlled on his presribed antidepressant      Vocational Rehabilitation: Provide vocational rehab assistance to qualifying candidates.   Vocational Rehab Evaluation & Intervention:     Vocational Rehab - 09/28/16 1702      Initial Vocational Rehab Evaluation & Intervention   Assessment shows need for Vocational Rehabilitation No  Pt denies need for vocational rehab at this time      Education: Education Goals: Education classes will be provided on a weekly basis, covering required topics. Participant will state understanding/return demonstration of topics presented.  Learning Barriers/Preferences:     Learning Barriers/Preferences - 09/27/16 1231      Learning Barriers/Preferences   Learning Barriers Sight   Learning Preferences Written Material      Education Topics: Count Your Pulse:  -Group instruction provided by verbal instruction, demonstration, patient participation and written materials to support subject.  Instructors address importance of being able to find your pulse and how to count your pulse when at home without a heart monitor.  Patients get hands on experience counting their pulse with staff help and individually. Flowsheet Row CARDIAC REHAB PHASE II EXERCISE from 10/27/2016 in [place]  Date  10/27/16  Educator  Barnet Pall RN  Instruction Review Code  2- meets goals/outcomes      Heart Attack, Angina, and Risk Factor Modification:  -Group instruction provided by verbal instruction,  video, and  written materials to support subject.  Instructors address signs and symptoms of angina and heart attacks.    Also discuss risk factors for heart disease and how to make changes to improve heart health risk factors.   Functional Fitness:  -Group instruction provided by verbal instruction, demonstration, patient participation, and written materials to support subject.  Instructors address safety measures for doing things around the house.  Discuss how to get up and down off the floor, how to pick things up properly, how to safely get out of a chair without assistance, and balance training.   Meditation and Mindfulness:  -Group instruction provided by verbal instruction, patient participation, and written materials to support subject.  Instructor addresses importance of mindfulness and meditation practice to help reduce stress and improve awareness.  Instructor also leads participants through a meditation exercise.  Flowsheet Row CARDIAC REHAB PHASE II EXERCISE from 12/13/2016 in [place]  Date  12/13/16  Instruction Review Code  2- meets goals/outcomes      Stretching for Flexibility and Mobility:  -Group instruction provided by verbal instruction, patient participation, and written materials to support subject.  Instructors lead participants through series of stretches that are designed to increase flexibility thus improving mobility.  These stretches are additional exercise for major muscle groups that are typically performed during regular warm up and cool down.   Hands Only CPR Anytime:  -Group instruction provided by verbal instruction, video, patient participation and written materials to support subject.  Instructors co-teach with AHA video for hands only CPR.  Participants get hands on experience with mannequins.   Nutrition I class: Heart Healthy Eating:  -Group instruction provided by PowerPoint slides, verbal discussion, and written  materials to support subject matter. The instructor gives an explanation and review of the Therapeutic Lifestyle Changes diet recommendations, which includes a discussion on lipid goals, dietary fat, sodium, fiber, plant stanol/sterol esters, sugar, and the components of a well-balanced, healthy diet.   Nutrition II class: Lifestyle Skills:  -Group instruction provided by PowerPoint slides, verbal discussion, and written materials to support subject matter. The instructor gives an explanation and review of label reading, grocery shopping for heart health, heart healthy recipe modifications, and ways to make healthier choices when eating out.   Diabetes Question & Answer:  -Group instruction provided by PowerPoint slides, verbal discussion, and written materials to support subject matter. The instructor gives an explanation and review of diabetes co-morbidities, pre- and post-prandial blood glucose goals, pre-exercise blood glucose goals, signs, symptoms, and treatment of hypoglycemia and hyperglycemia, and foot care basics.   Diabetes Blitz:  -Group instruction provided by PowerPoint slides, verbal discussion, and written materials to support subject matter. The instructor gives an explanation and review of the physiology behind type 1 and type 2 diabetes, diabetes medications and rational behind using different medications, pre- and post-prandial blood glucose recommendations and Hemoglobin A1c goals, diabetes diet, and exercise including blood glucose guidelines for exercising safely.    Portion Distortion:  -Group instruction provided by PowerPoint slides, verbal discussion, written materials, and food models to support subject matter. The instructor gives an explanation of serving size versus portion size, changes in portions sizes over the last 20 years, and what consists of a serving from each food group.   Stress Management:  -Group instruction provided by verbal instruction, video, and  written materials to support subject matter.  Instructors review role of stress in heart disease and how to cope with stress positively.   Flowsheet  Row CARDIAC REHAB PHASE II EXERCISE from 12/13/2016 in [place]  Date  11/10/16  Instruction Review Code  2- meets goals/outcomes      Exercising on Your Own:  -Group instruction provided by verbal instruction, power point, and written materials to support subject.  Instructors discuss benefits of exercise, components of exercise, frequency and intensity of exercise, and end points for exercise.  Also discuss use of nitroglycerin and activating EMS.  Review options of places to exercise outside of rehab.  Review guidelines for sex with heart disease.   Cardiac Drugs I:  -Group instruction provided by verbal instruction and written materials to support subject.  Instructor reviews cardiac drug classes: antiplatelets, anticoagulants, beta blockers, and statins.  Instructor discusses reasons, side effects, and lifestyle considerations for each drug class. Flowsheet Row CARDIAC REHAB PHASE II EXERCISE from 12/13/2016 in [place]  Date  12/06/16  Instruction Review Code  2- meets goals/outcomes      Cardiac Drugs II:  -Group instruction provided by verbal instruction and written materials to support subject.  Instructor reviews cardiac drug classes: angiotensin converting enzyme inhibitors (ACE-I), angiotensin II receptor blockers (ARBs), nitrates, and calcium channel blockers.  Instructor discusses reasons, side effects, and lifestyle considerations for each drug class.   Anatomy and Physiology of the Circulatory System:  -Group instruction provided by verbal instruction, video, and written materials to support subject.  Reviews functional anatomy of heart, how it relates to various diagnoses, and what role the heart plays in the overall system. Flowsheet Row CARDIAC REHAB PHASE II  EXERCISE from 10/27/2016 in [place]  Date  10/11/16  Instruction Review Code  2- meets goals/outcomes      Knowledge Questionnaire Score:     Knowledge Questionnaire Score - 09/28/16 7116      Knowledge Questionnaire Score   Pre Score 22/24      Core Components/Risk Factors/Patient Goals at Admission:     Personal Goals and Risk Factors at Admission - 09/28/16 0920      Core Components/Risk Factors/Patient Goals on Admission    Weight Management Yes   Intervention Weight Management: Develop a combined nutrition and exercise program designed to reach desired caloric intake, while maintaining appropriate intake of nutrient and fiber, sodium and fats, and appropriate energy expenditure required for the weight goal.;Weight Management: Provide education and appropriate resources to help participant work on and attain dietary goals.;Weight Management/Obesity: Establish reasonable short term and long term weight goals.;Obesity: Provide education and appropriate resources to help participant work on and attain dietary goals.   Expected Outcomes Long Term: Adherence to nutrition and physical activity/exercise program aimed toward attainment of established weight goal;Short Term: Continue to assess and modify interventions until short term weight is achieved;Weight Maintenance: Understanding of the daily nutrition guidelines, which includes 25-35% calories from fat, 7% or less cal from saturated fats, less than 2109m cholesterol, less than 1.5gm of sodium, & 5 or more servings of fruits and vegetables daily;Weight Loss: Understanding of general recommendations for a balanced deficit meal plan, which promotes 1-2 lb weight loss per week and includes a negative energy balance of [phone] kcal/d;Understanding recommendations for meals to include 15-35% energy as protein, 25-35% energy from fat, 35-60% energy from carbohydrates, less than 2078mof dietary cholesterol, 20-35  gm of total fiber daily;Understanding of distribution of calorie intake throughout the day with the consumption of 4-5 meals/snacks   Increase Strength and Stamina Yes  Intervention Provide advice, education, support and counseling about physical activity/exercise needs.;Develop an individualized exercise prescription for aerobic and resistive training based on initial evaluation findings, risk stratification, comorbidities and participant's personal goals.   Expected Outcomes Achievement of increased cardiorespiratory fitness and enhanced flexibility, muscular endurance and strength shown through measurements of functional capacity and personal statement of participant.   Improve shortness of breath with ADL's Yes   Intervention Provide education, individualized exercise plan and daily activity instruction to help decrease symptoms of SOB with activities of daily living.   Expected Outcomes Short Term: Achieves a reduction of symptoms when performing activities of daily living.   Personal Goal Other Yes   Personal Goal Learn activity restrictions/limitations. Be able to exert self in confidence and exercise safely. Be able to split wood, transfer and carry, and use a push mower.   Intervention Provide education on exercise programming to assist with increasing strength and exercise tolerance    Expected Outcomes Pt will be able to use a push mower and other tools needed to split wood and learn activity limitations to be able to perform tasks in confidence.      Core Components/Risk Factors/Patient Goals Review:      Goals and Risk Factor Review    Row Name 10/25/16 1211 11/20/16 1347           Core Components/Risk Factors/Patient Goals Review   Personal Goals Review Increase Strength and Stamina;Other Increase Strength and Stamina;Other      Review pt states he has more energy and he is tolerating WL increases well.  pt states he has more energy and he is tolerating WL increases well.  pt  is also walking 3 days a week at home for 20 min.      Expected Outcomes Develop a regular exercise routine and increase overall cardiorespiratory fitness Continue HEP and attendance to CRPII in order to increaes overall cardiorespiratory fitness         Core Components/Risk Factors/Patient Goals at Discharge (Final Review):      Goals and Risk Factor Review - 11/20/16 1347      Core Components/Risk Factors/Patient Goals Review   Personal Goals Review Increase Strength and Stamina;Other   Review pt states he has more energy and he is tolerating WL increases well.  pt is also walking 3 days a week at home for 20 min.   Expected Outcomes Continue HEP and attendance to CRPII in order to increaes overall cardiorespiratory fitness      ITP Comments:     ITP Comments    Row Name 09/28/16 0918 10/20/16 1235         ITP Comments Dr. Fransico Him, Medical Director attended CPR education class.  outcomes and goals met          Comments: Timothy Escobar is making expected progress toward personal goals after completing 24 sessions. Recommend continued exercise and life style modification education including  stress management and relaxation techniques to decrease cardiac risk profile. Harrell Gave RN BSN

## 2016-12-22 ENCOUNTER — Encounter (HOSPITAL_COMMUNITY)
Admission: RE | Admit: 2016-12-22 | Discharge: 2016-12-22 | Disposition: A | Discharge status: Home / Self Care | Payer: Medicare Other | Source: Ambulatory Visit | Attending: Cardiovascular Disease | Admitting: Cardiovascular Disease

## 2016-12-22 DIAGNOSIS — I2102 ST elevation (STEMI) myocardial infarction involving left anterior descending coronary artery: Secondary | ICD-10-CM | POA: Insufficient documentation

## 2016-12-25 ENCOUNTER — Encounter (HOSPITAL_COMMUNITY)
Admission: RE | Admit: 2016-12-25 | Discharge: 2016-12-25 | Disposition: A | Discharge status: Home / Self Care | Payer: Medicare Other | Source: Ambulatory Visit | Attending: Cardiovascular Disease | Admitting: Cardiovascular Disease

## 2016-12-25 DIAGNOSIS — I2102 ST elevation (STEMI) myocardial infarction involving left anterior descending coronary artery: Secondary | ICD-10-CM

## 2016-12-27 ENCOUNTER — Encounter (HOSPITAL_COMMUNITY)
Admission: RE | Admit: 2016-12-27 | Discharge: 2016-12-27 | Disposition: A | Discharge status: Home / Self Care | Payer: Medicare Other | Source: Ambulatory Visit | Attending: Cardiovascular Disease | Admitting: Cardiovascular Disease

## 2016-12-27 VITALS — Wt 170.6 lb

## 2016-12-27 DIAGNOSIS — I2102 ST elevation (STEMI) myocardial infarction involving left anterior descending coronary artery: Secondary | ICD-10-CM

## 2016-12-29 ENCOUNTER — Encounter (HOSPITAL_COMMUNITY)
Admission: RE | Admit: 2016-12-29 | Discharge: 2016-12-29 | Disposition: A | Discharge status: Home / Self Care | Payer: Medicare Other | Source: Ambulatory Visit | Attending: Cardiovascular Disease | Admitting: Cardiovascular Disease

## 2016-12-29 DIAGNOSIS — I2102 ST elevation (STEMI) myocardial infarction involving left anterior descending coronary artery: Secondary | ICD-10-CM | POA: Diagnosis not present

## 2017-01-01 ENCOUNTER — Encounter (HOSPITAL_COMMUNITY)
Admission: RE | Admit: 2017-01-01 | Discharge: 2017-01-01 | Disposition: A | Discharge status: Home / Self Care | Payer: Medicare Other | Source: Ambulatory Visit | Attending: Cardiovascular Disease | Admitting: Cardiovascular Disease

## 2017-01-01 DIAGNOSIS — I2102 ST elevation (STEMI) myocardial infarction involving left anterior descending coronary artery: Secondary | ICD-10-CM | POA: Diagnosis not present

## 2017-01-03 ENCOUNTER — Encounter (HOSPITAL_COMMUNITY)
Admission: RE | Admit: 2017-01-03 | Discharge: 2017-01-03 | Disposition: A | Discharge status: Home / Self Care | Payer: Medicare Other | Source: Ambulatory Visit | Attending: Cardiovascular Disease | Admitting: Cardiovascular Disease

## 2017-01-03 DIAGNOSIS — I2102 ST elevation (STEMI) myocardial infarction involving left anterior descending coronary artery: Secondary | ICD-10-CM | POA: Diagnosis not present

## 2017-01-04 ENCOUNTER — Ambulatory Visit: Payer: Medicare Other | Attending: Family Medicine | Admitting: Audiology

## 2017-01-04 DIAGNOSIS — H9192 Unspecified hearing loss, left ear: Secondary | ICD-10-CM

## 2017-01-04 DIAGNOSIS — H93299 Other abnormal auditory perceptions, unspecified ear: Secondary | ICD-10-CM | POA: Diagnosis not present

## 2017-01-04 DIAGNOSIS — H9325 Central auditory processing disorder: Secondary | ICD-10-CM | POA: Diagnosis not present

## 2017-01-04 DIAGNOSIS — R292 Abnormal reflex: Secondary | ICD-10-CM | POA: Diagnosis not present

## 2017-01-04 DIAGNOSIS — H748X9 Other specified disorders of middle ear and mastoid, unspecified ear: Secondary | ICD-10-CM

## 2017-01-04 NOTE — Procedures (Signed)
[facility]  [place], [place] [address]  [phone]   Audiological Evaluation  Patient Name: Timothy Escobar   Status: Outpatient   DOB: 12-Jan-1941    Diagnosis: Hearing Loss              MRN: [id number] Date:  01/04/2017     Referent: Timothy Forts, MD  History: Timothy Escobar was seen for an audiological evaluation. TREG DIEMER states that he is an active "singer" and is very involved in choral groups.  He is currently participating in cardiac rehab at [facility] following a "heart attack last year".  DELL BRINER states that he has "nasal polyps on the left side" for which surgery has been recommended. 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 History of hypertension: N History of diabetes:  N Family history of hearing loss:  Older brother has bilateral hearing aids, sister in late 17's told needed hearing aids. Other concerns:  Medications: heart medicine.   Evaluation: Conventional pure tone audiometry from 250Hz  - 8000Hz  with using insert earphones.  Hearing Thresholds show symmetrical hearing thresholds of 10-20 dBHL from 250Hz  - 2000Hz ; 25 dBHL at 30-35 dBHL at 4000Hz ; and 25-30 dBHL at 8000Hz . 6000Hz  has a difference in hearing thresholds of 25 dBHL in the right ear and 40 dBHL in the left ear. Please note that there is a mixed hearing loss with 6000Hz  being primarily conductive, with the rest of the hearing thresholds sensorineural. Reliability is good Speech reception levels (repeating words near threshold) using recorded spondee word lists:  Right ear: 20 dBHL.  Left ear:  20 dBHL Word recognition (at  comfortably loud volumes) using recorded NU-6 word lists at 60 dBHL (Most Comfortable Level), in quiet.  Right ear: 100%.  Left ear:   100% Word recognition in minimal background noise (PBK words in multitalker noise):  +5 dBHL  Right ear: 76%                              Left ear:  76%  Tympanometry shows normal middle ear volume and pressure bilaterally.  Right ear: Normal compliance (Type A) with 85-90 dB acoustic reflexes from 500Hz  - 2000Hz  and 100dBHL at 4000Hz .  Left ear: Excessive compliance (Type Ad) with acoustic reflexes of 95 dB at 500Hz ; 90 db at 1000Hz ; 100dB at 2000Hz  and no response at 4000Hz .  Competing Sentences (CS) involved a different sentences being presented to each ear at different volumes. The instructions are to repeat the softer volume sentences. Posterior temporal issues will show poorer performance in the ear contralateral to the lobe involved.  Conroy scored 100% in the right ear and 90% in the left ear.  The test results are abnormal on the left side which is consistent with Central Auditory Processing Disorder (CAPD).  Dichotic Digits (DD) presents different two digits to each ear. All four digits are to be repeated. Poor performance suggests that cerebellar and/or brainstem may be involved. Robson scored 90% in the right ear and 75% in the left ear. The test results indicate that [facility] scored abnormal on the left side which is consistent with [employer]  Processing Disorder (CAPD).   CONCLUSION:     Timothy Escobar normal low frequency hearing with a slight to mild high frequency hearing loss from 3000Hz  - 8000Hz  that is poorer on the left side. The high frequency hearing loss has a mixed component, sensorineural at 3000Hz  - 4000Hz  with a conductive component at 6000Hz  - 8000Hz .   Word recognition is excellent in each ear in quiet at conversational speech levels bilaterally. In minimal background noise, word recognition drops to fair in each ear which  explains the difficulty that Timothy Escobar particularly has in minimal background noise. By history, the difficulty hearing in background noise is long-standing and is suspect of an auditory processing disorder, so additional testing was completed today, which is positive for Central Auditory Processing Disorder (CAPD).       Auditory Processing results indicate that [name] has difficulty processing auditory information when more than one thing is going on, with no or minimal difficulty in quiet. Missing a significant amount of information in most listening situations (other than quiet) is expected such as with the movement of papers, conversation or physical movement or even with sitting near the hum of computers or heating AC units. Sit away from possible noise sources and near the speaker for optimal signal to noise ratior, to improve the chance of correctly hearing. At home, talking in the kitchen and/or bathrooms is not ideal because of poor acoustics (reverberation) and increased noise from water, etc.  Maintaining eye contact during conversation at a distance of 3-5 feet is ideal, even in relatively quiet settings.  Central Auditory Processing Disorder (CAPD) creates a hearing difference even when hearing thresholds are within normal limits.  Speech sounds may be heard out of order or there may be delays in the processing of the speech signal.   A common characteristic of those with CAPD is insecurity and auditory fatigue from the extra effort it requires to attempt to hear with faulty processing.  It may not be possible to request as frequent clarification as may be needed and the person with the CAPD may not be aware they have heard incorrectly.  The use of technology to help with auditory weakness is beneficial including assistive listening devices for in the car, restaurants and watching TV.  Please be aware that there are discrete recording devices such as a livescribe pen to help with  memory. Online searches as well as consulting a Manufacturing engineer (info provided to Timothy Escobar) will be helpful.  Hearing aids are an option because Shahiem is missing approximately 20% of the speech sounds at conversational speech level,  but with so much normal hearing Din may be borderline at this point. However, should he choose to try them, [place] requires a trial period so that he would be able to return them if he did not find benefit.  Closely monitoring hearing is recommended because should his hearing drop further he will clearly be a candidate for amplification.  As a proactive measure to improve/maintain auditory processing ability the use of a computer based auditory processing program to improve phonological awareness such as LACE by neurotone is recommended. Using this program 10-15 minutes 4-5 days per week until completed is recommended for therapeutic benefit. The test results were discussed and Timothy Escobar counseled.   RECOMMENDATIONS: 1.   Auditory processing self-help computer programs are now available for IPAD and computer download.  Benefit has been shown with intensive use for 10-15 minutes,  4-5 days per  week. Research is suggesting that using the programs for a short amount of time each day is better for the auditory processing development than completing the program in a short amount of time by doing it several hours per day.         LACE  (teenage to adult)     PC download or cd    www.neurotone.com  2.  Strategies that help improve hearing include: A) Face the speaker directly. Optimal is having the speakers face well - lit.  Unless amplified, being within 3-6 feet of the speaker will enhance word recognition. B) Avoid having the speaker back-lit as this will minimize the ability to use cues from lip-reading, facial expression and gestures. C)  Word recognition is poorer in background noise. For optimal word recognition, turn off the TV, radio or noisy fan when  engaging in conversation. In a restaurant, try to sit away from noise sources and close to the primary speaker.  D)  Ask for topic clarification from time to time in order to remain in the conversation.  Most people don't mind repeating or clarifying a point when asked.  If needed, explain the difficulty hearing in background noise or hearing loss.  3.   Use hearing protection during noisy activities such as using a weed eater, moving the lawn, shooting, etc.    Musician's plugs, are available from [employer].com for music related hearing protection because there is no distortion.  Other hearing protection, such as sponge plugs (available at pharmacies) or earmuffs (available at sporting goods stores or department stores such as Paediatric nurse) are useful for noisy activities and venues.  4.   Assistive listening devices for the smart phone and for listening to TV are recommended. However, Dontell is borderline for needing hearing aids at this point. Close monitoring of his hearing with re-evaluation in one year is recommended - earlier if there is a change in hearing. FYI - Amplification helps make the signal louder and therefore often improves hearing and word recognition.  Amplification has many forms including hearing aids in one or both ears, an assistive listening device which have a microphone and speaker such as a small handheld device and/or even a surround sound system of speakers.  Amplification may be covered by some insurances, but not all.  It is important to note that hearing aids must be individually fit according to the hearing test results and the ear shape.  Audiologists and hearing aid dealers in [place] must be licensed in order to dispense hearing aids.  In addition, a trial period is mandated by law in our state because often amplification must be tried and then evaluated in order to determine benefit.  There are many excellent choices when it comes to amplification in our area and providers  are listed in the phone book under hearing aids, there are audiologists in private practice, those affiliated with Ear, Nose and Throat physicians, and there are audiologists located at [employer].   Deborah L. Heide Spark, Au.D., CCC-A Doctor of Audiology 01/04/2017   cc: Timothy Forts, MD

## 2017-01-04 NOTE — Patient Instructions (Signed)
  Auditory processing self-help computer programs are now available for IPAD and computer download.  Benefit has been shown with intensive use for 10-15 minutes,  4-5 days per week. Research is suggesting that using the programs for a short amount of time each day is better for the auditory processing development than completing the program in a short amount of time by doing it several hours per day.         LACE  (teenage to adult)     PC download or cd                                                    www.neurotone.com   Other self-help measures include: 1) have conversation face to face  2) minimize background noise when having a conversation- turn off the TV, move to a quiet area of the area 3) be aware that auditory processing problems become worse with fatigue and stress  4) Avoid having important conversation with Aasir's back to the speaker.

## 2017-01-05 ENCOUNTER — Encounter (HOSPITAL_COMMUNITY)
Admission: RE | Admit: 2017-01-05 | Discharge: 2017-01-05 | Disposition: A | Discharge status: Home / Self Care | Payer: Medicare Other | Source: Ambulatory Visit | Attending: Cardiovascular Disease | Admitting: Cardiovascular Disease

## 2017-01-05 DIAGNOSIS — I2102 ST elevation (STEMI) myocardial infarction involving left anterior descending coronary artery: Secondary | ICD-10-CM | POA: Diagnosis not present

## 2017-01-08 ENCOUNTER — Encounter (HOSPITAL_COMMUNITY)
Admission: RE | Admit: 2017-01-08 | Discharge: 2017-01-08 | Disposition: A | Discharge status: Home / Self Care | Payer: Medicare Other | Source: Ambulatory Visit | Attending: Cardiovascular Disease | Admitting: Cardiovascular Disease

## 2017-01-08 DIAGNOSIS — I2102 ST elevation (STEMI) myocardial infarction involving left anterior descending coronary artery: Secondary | ICD-10-CM | POA: Diagnosis not present

## 2017-01-10 ENCOUNTER — Encounter (HOSPITAL_COMMUNITY)
Admission: RE | Admit: 2017-01-10 | Discharge: 2017-01-10 | Disposition: A | Discharge status: Home / Self Care | Payer: Medicare Other | Source: Ambulatory Visit | Attending: Cardiovascular Disease | Admitting: Cardiovascular Disease

## 2017-01-10 DIAGNOSIS — I2102 ST elevation (STEMI) myocardial infarction involving left anterior descending coronary artery: Secondary | ICD-10-CM | POA: Diagnosis not present

## 2017-01-12 ENCOUNTER — Encounter (HOSPITAL_COMMUNITY)
Admission: RE | Admit: 2017-01-12 | Discharge: 2017-01-12 | Disposition: A | Discharge status: Home / Self Care | Payer: Medicare Other | Source: Ambulatory Visit | Attending: Cardiovascular Disease | Admitting: Cardiovascular Disease

## 2017-01-12 DIAGNOSIS — I2102 ST elevation (STEMI) myocardial infarction involving left anterior descending coronary artery: Secondary | ICD-10-CM

## 2017-01-15 ENCOUNTER — Telehealth: Payer: Self-pay | Admitting: Cardiovascular Disease

## 2017-01-15 ENCOUNTER — Encounter (HOSPITAL_COMMUNITY)
Admission: RE | Admit: 2017-01-15 | Discharge: 2017-01-15 | Disposition: A | Discharge status: Home / Self Care | Payer: Medicare Other | Source: Ambulatory Visit | Attending: Cardiovascular Disease | Admitting: Cardiovascular Disease

## 2017-01-15 VITALS — Wt 168.0 lb

## 2017-01-15 DIAGNOSIS — I2102 ST elevation (STEMI) myocardial infarction involving left anterior descending coronary artery: Secondary | ICD-10-CM

## 2017-01-15 NOTE — Telephone Encounter (Signed)
[name] is calling about Blood Pressure on [name] during therapy .Marland Kitchen Thanks

## 2017-01-15 NOTE — Telephone Encounter (Signed)
Received call from Cardiac Rehab that the patient's blood pressure went up to 200/62 on the treadmill. The blood pressure did go down to 122/70. The rest are listed below:  Blood Pressure (Admit) 108/70    Blood Pressure (Exercise) 122/80   Blood Pressure (Exit) 108/74   Heart Rate (Admit) 67 bpm   Heart Rate (Exercise) 118 bpm   Heart Rate (Exit) 66 bpm   The patient is on Lisinopril 2.5 mg. Cardiac rehab was wondering if he need something else for the blood pressure.

## 2017-01-15 NOTE — Telephone Encounter (Signed)
NO additional recommendation at this time.  Patient BP and HR are goal when at rest. Diastolic BP in the 21Y during exercise.

## 2017-01-15 NOTE — Progress Notes (Signed)
Timothy Escobar's entry blood pressure was noted at 114/72 with a resting heart rate of 65 this morning. Rush Landmark took his medications as prescribed. Blood pressure noted at 200/62 on the treadmill with a speed of 3.5/5.0. Timothy Escobar's incline was taken off. Recheck blood pressure was 168/80. Then 182/80. Timothy Escobar walked a few laps on the track with a recheck blood pressure of 124/80.  Timothy Escobar's blood pressure was noted at 144/60. Dr Evette Georges office called and notified about Timothy Escobar's elevated blood pressure today. I spoke with [name]. Timothy Escobar exit blood pressure was 118/74 with a heart rate of 72. Timothy Escobar graduates today and left cardiac rehab without complaints. Will fax exercise flow sheets to Dr. Evette Georges office for review.Barnet Pall, RN,BSN 01/15/2017 2:03 PM

## 2017-01-15 NOTE — Progress Notes (Signed)
Discharge Summary  Patient Details  Name: Timothy Escobar MRN: [id number] Date of Birth: [birth year] Referring Provider:     [place] from 09/28/2016 in [place]  Referring Provider  Timothy Majestic MD       Number of Visits: 36  Reason for Discharge:  Patient independent in their exercise.  Smoking History:  History  Smoking Status  . Former Smoker  . Quit date: 01/22/1967  Smokeless Tobacco  . Never Used    Diagnosis:  08/22/16 ST elevation myocardial infarction involving left anterior descending (LAD) coronary artery (HCC)  ADL UCSD:   Initial Exercise Prescription:     Initial Exercise Prescription - 09/28/16 1200      Date of Initial Exercise RX and Referring Provider   Date 09/28/16   Referring Provider Timothy Majestic MD     Treadmill   MPH 3.2   Grade 2   Minutes 10   METs 4.3     Bike   Level 1.2   Minutes 10   METs 3.83     NuStep   Level 3   Minutes 10   METs 2     Prescription Details   Frequency (times per week) 3     Intensity   THRR 40-80% of Max Heartrate 59-118   Ratings of Perceived Exertion 11-13     Progression   Progression Continue progressive overload as per policy without signs/symptoms or physical distress.     Resistance Training   Training Prescription Yes   Weight 3lbs   Reps 10-12      Discharge Exercise Prescription (Final Exercise Prescription Changes):     Exercise Prescription Changes - 01/17/17 1600      Response to Exercise   Blood Pressure (Admit) 114/72   Blood Pressure (Exercise) 200/62   Blood Pressure (Exit) 118/74   Heart Rate (Admit) 65 bpm   Heart Rate (Exercise) 115 bpm   Heart Rate (Exit) 72 bpm   Rating of Perceived Exertion (Exercise) 13   Duration Progress to 45 minutes of aerobic exercise without signs/symptoms of physical distress   Intensity THRR unchanged     Progression   Progression Continue to progress workloads to  maintain intensity without signs/symptoms of physical distress.   Average METs 6.1     Resistance Training   Training Prescription Yes   Weight 6lb   Reps 10-15     Treadmill   MPH 3.5   Grade 5   Minutes 10   METs 6.09     Bike   Level 2.3   Minutes 10   METs 6.67     NuStep   Level 6   Minutes 10   METs 5.6     Home Exercise Plan   Frequency Add 2 additional days to program exercise sessions.     Exercise Review   Progression Yes      Functional Capacity:     6 Minute Walk    Row Name 09/28/16 0927 09/28/16 1230 12/27/16 1336     6 Minute Walk   Phase Initial  - Discharge   Distance 2303 feet  - 2509 feet   Distance % Change  -  - 8.9 %   Walk Time 6 minutes  - 6 minutes   # of Rest Breaks 0  - 0   MPH  - 4.4 4.8   METS  - 5.3 6   RPE _0 VO2  Peak  - 18.4 21   Symptoms No  - No   Resting HR 70 bpm  - 63 bpm   Resting BP 132/76  - 124/72   Max Ex. HR 122 bpm  - 142 bpm   Max Ex. BP 178/82  - 188/78   2 Minute Post BP 144/82  - 110/68      Psychological, QOL, Others - Outcomes: PHQ 2/9: Depression screen [facility] 2/9 01/15/2017 12/14/2016 11/21/2016 06/08/2016 10/07/2015  Decreased Interest 0 0 0 0 0  Down, Depressed, Hopeless 0 0 0 0 1  PHQ - 2 Score 0 0 0 0 1    Quality of Life:     Quality of Life - 01/08/17 1356      Quality of Life Scores   Health/Function Pre 28.67 %   Health/Function Post 29.17 %   Health/Function % Change 1.74 %   Socioeconomic Pre 28.57 %   Socioeconomic Post 30 %   Socioeconomic % Change  5.01 %   Psych/Spiritual Pre 28.21 %   Psych/Spiritual Post 30 %   Psych/Spiritual % Change 6.35 %   Family Pre 30 %   Family Post 30 %   Family % Change 0 %   GLOBAL Pre 28.75 %   GLOBAL Post 29.63 %   GLOBAL % Change 3.06 %      Personal Goals: Goals established at orientation with interventions provided to work toward goal.     Personal Goals and Risk Factors at Admission - 09/28/16 0920      Core  Components/Risk Factors/Patient Goals on Admission    Weight Management Yes   Intervention Weight Management: Develop a combined nutrition and exercise program designed to reach desired caloric intake, while maintaining appropriate intake of nutrient and fiber, sodium and fats, and appropriate energy expenditure required for the weight goal.;Weight Management: Provide education and appropriate resources to help participant work on and attain dietary goals.;Weight Management/Obesity: Establish reasonable short term and long term weight goals.;Obesity: Provide education and appropriate resources to help participant work on and attain dietary goals.   Expected Outcomes Long Term: Adherence to nutrition and physical activity/exercise program aimed toward attainment of established weight goal;Short Term: Continue to assess and modify interventions until short term weight is achieved;Weight Maintenance: Understanding of the daily nutrition guidelines, which includes 25-35% calories from fat, 7% or less cal from saturated fats, less than 264m cholesterol, less than 1.5gm of sodium, & 5 or more servings of fruits and vegetables daily;Weight Loss: Understanding of general recommendations for a balanced deficit meal plan, which promotes 1-2 lb weight loss per week and includes a negative energy balance of [phone] kcal/d;Understanding recommendations for meals to include 15-35% energy as protein, 25-35% energy from fat, 35-60% energy from carbohydrates, less than 2082mof dietary cholesterol, 20-35 gm of total fiber daily;Understanding of distribution of calorie intake throughout the day with the consumption of 4-5 meals/snacks   Increase Strength and Stamina Yes   Intervention Provide advice, education, support and counseling about physical activity/exercise needs.;Develop an individualized exercise prescription for aerobic and resistive training based on initial evaluation findings, risk stratification, comorbidities  and participant's personal goals.   Expected Outcomes Achievement of increased cardiorespiratory fitness and enhanced flexibility, muscular endurance and strength shown through measurements of functional capacity and personal statement of participant.   Improve shortness of breath with ADL's Yes   Intervention Provide education, individualized exercise plan and daily activity instruction to help decrease symptoms of SOB with activities of daily living.  Expected Outcomes Short Term: Achieves a reduction of symptoms when performing activities of daily living.   Personal Goal Other Yes   Personal Goal Learn activity restrictions/limitations. Be able to exert self in confidence and exercise safely. Be able to split wood, transfer and carry, and use a push mower.   Intervention Provide education on exercise programming to assist with increasing strength and exercise tolerance    Expected Outcomes Pt will be able to use a push mower and other tools needed to split wood and learn activity limitations to be able to perform tasks in confidence.       Personal Goals Discharge:     Goals and Risk Factor Review    Row Name 10/25/16 1211 11/20/16 1347           Core Components/Risk Factors/Patient Goals Review   Personal Goals Review Increase Strength and Stamina;Other Increase Strength and Stamina;Other      Review pt states he has more energy and he is tolerating WL increases well.  pt states he has more energy and he is tolerating WL increases well.  pt is also walking 3 days a week at home for 20 min.      Expected Outcomes Develop a regular exercise routine and increase overall cardiorespiratory fitness Continue HEP and attendance to CRPII in order to increaes overall cardiorespiratory fitness         Nutrition & Weight - Outcomes:     Pre Biometrics - 09/28/16 1239      Pre Biometrics   % Body Fat 27.5 %         Post Biometrics - 01/17/17 1634       Post  Biometrics   Weight 167  lb 15.9 oz (76.2 kg)      Nutrition:     Nutrition Therapy & Goals - 11/27/16 1144      Nutrition Therapy   Diet Therapeutic Lifestyle Changes     Personal Nutrition Goals   Nutrition Goal Weight loss of 1-2 lb/week to a wt loss goal of ~160-165 lb at graduation from rehab     [place], educate and counsel regarding individualized specific dietary modifications aiming towards targeted core components such as weight, hypertension, lipid management, diabetes, heart failure and other comorbidities.   Expected Outcomes Short Term Goal: Understand basic principles of dietary content, such as calories, fat, sodium, cholesterol and nutrients.;Long Term Goal: Adherence to prescribed nutrition plan.      Nutrition Discharge:     Nutrition Assessments - 01/04/17 0956      MEDFICTS Scores   Pre Score 23   Post Score 17   Score Difference -6      Education Questionnaire Score:     Knowledge Questionnaire Score - 01/17/17 1629      Knowledge Questionnaire Score   Post Score 23/24      Goals reviewed with patient; copy given to patient. Timothy Escobar graduated from cardiac rehab program today with completion of 36 exercise sessions in Phase II. Pt maintained good attendance and progressed nicely during his participation in rehab as evidenced by increased MET level.   Medication list reconciled. Repeat  PHQ score- 0 .  Pt has made significant lifestyle changes and should be commended for his success. Timothy Escobar feels he has achieved his goals during cardiac rehab.   Timothy Escobar  plans to continue exercise by walking on his own.Barnet Pall, RN,BSN 01/25/2017 11:45 AM

## 2017-01-17 ENCOUNTER — Encounter (HOSPITAL_COMMUNITY): Payer: Medicare Other

## 2017-01-19 ENCOUNTER — Encounter (HOSPITAL_COMMUNITY): Payer: Medicare Other

## 2017-01-21 ENCOUNTER — Emergency Department (HOSPITAL_COMMUNITY): Payer: Medicare Other

## 2017-01-21 ENCOUNTER — Emergency Department (HOSPITAL_COMMUNITY)
Admission: EM | Admit: 2017-01-21 | Discharge: 2017-01-22 | Disposition: A | Discharge status: Home / Self Care | Payer: Medicare Other | Attending: Emergency Medicine | Admitting: Emergency Medicine

## 2017-01-21 ENCOUNTER — Encounter (HOSPITAL_COMMUNITY): Payer: Self-pay | Admitting: Emergency Medicine

## 2017-01-21 DIAGNOSIS — I252 Old myocardial infarction: Secondary | ICD-10-CM | POA: Diagnosis not present

## 2017-01-21 DIAGNOSIS — R0789 Other chest pain: Secondary | ICD-10-CM | POA: Diagnosis not present

## 2017-01-21 DIAGNOSIS — Z85828 Personal history of other malignant neoplasm of skin: Secondary | ICD-10-CM | POA: Insufficient documentation

## 2017-01-21 DIAGNOSIS — Z7901 Long term (current) use of anticoagulants: Secondary | ICD-10-CM | POA: Insufficient documentation

## 2017-01-21 DIAGNOSIS — I2510 Atherosclerotic heart disease of native coronary artery without angina pectoris: Secondary | ICD-10-CM | POA: Diagnosis not present

## 2017-01-21 DIAGNOSIS — R079 Chest pain, unspecified: Secondary | ICD-10-CM | POA: Diagnosis not present

## 2017-01-21 DIAGNOSIS — Z87891 Personal history of nicotine dependence: Secondary | ICD-10-CM | POA: Insufficient documentation

## 2017-01-21 DIAGNOSIS — Z7982 Long term (current) use of aspirin: Secondary | ICD-10-CM | POA: Insufficient documentation

## 2017-01-21 LAB — CBC
HEMATOCRIT: 38.3 % — AB (ref 39.0–52.0)
HEMOGLOBIN: 13.1 g/dL (ref 13.0–17.0)
MCH: 31.4 pg (ref 26.0–34.0)
MCHC: 34.2 g/dL (ref 30.0–36.0)
MCV: 91.8 fL (ref 78.0–100.0)
Platelets: 162 10*3/uL (ref 150–400)
RBC: 4.17 MIL/uL — ABNORMAL LOW (ref 4.22–5.81)
RDW: 13.6 % (ref 11.5–15.5)
WBC: 5.3 10*3/uL (ref 4.0–10.5)

## 2017-01-21 LAB — BASIC METABOLIC PANEL
ANION GAP: 10 (ref 5–15)
BUN: 19 mg/dL (ref 6–20)
CHLORIDE: 103 mmol/L (ref 101–111)
CO2: 24 mmol/L (ref 22–32)
Calcium: 9 mg/dL (ref 8.9–10.3)
Creatinine, Ser: 0.85 mg/dL (ref 0.61–1.24)
GFR calc Af Amer: >60 mL/min (ref >60)
GLUCOSE: 139 mg/dL — AB (ref 65–99)
POTASSIUM: 4 mmol/L (ref 3.5–5.1)
Sodium: 137 mmol/L (ref 135–145)

## 2017-01-21 LAB — I-STAT TROPONIN, ED: Troponin i, poc: 0 ng/mL (ref 0.00–0.08)

## 2017-01-21 NOTE — ED Provider Notes (Signed)
[facility] DEPT Provider Note   CSN: [id number] Arrival date & time: 01/21/17  2138   By signing my name below, I, Neta Mends, attest that this documentation has been prepared under the direction and in the presence of Merryl Hacker, MD . Electronically Signed: Neta Mends, ED Scribe. 01/21/2017. 11:29 PM.   History   Chief Complaint Chief Complaint  Patient presents with  . Chest Pain   The history is provided by the patient. No language interpreter was used.   HPI Comments:  Timothy Escobar is a 76 y.o. male with PMHx of CAD and MI who presents to the Emergency Department complaining of an episode of persistent central chest pain that began earlier today. Pt states that he came in from working outside this afternoon and started having chest pain that persisted for 4-5 hours until 2130 and has since resolved. He states that the pain mild and was different than when he previous had a MI. He took several Tums with no relief. Pt reports a previous MI on 08/22/2016. Pt had several coronary stents placed by Dr. Claiborne Billings, and finished cardiac rehab last week. Takes Brilinta, lisinopril, and fluoxetine regularly. Denies SOB, diaphoresis.   Patient had a STEMI in October. He had multiple stents to his 100% LAD lesion.  Past Medical History:  Diagnosis Date  . Blood transfusion without reported diagnosis   . CAD (coronary artery disease)    a. 08/22/2016 Ant STEMI/PCI: LM nl, LAD 30ost, 100p (3.0x15 Synergy DES & 2.75x24 Synergy DES), LCX large/dominant/nl, RCA nondom, nl;  b. 08/2016 Echo: Ef 60-65%, no rwma.  . Depression   . Dyslipidemia   . Skin cancer    skin cancer    Patient Active Problem List   Diagnosis Date Noted  . Acute bronchitis 11/21/2016  . Dyslipidemia   . CAD (coronary artery disease)   . Status post coronary artery stent placement   . Bradycardia   . Hypokalemia   . Hyperlipidemia with target LDL less than 70   . STEMI (ST elevation myocardial  infarction) ([place]) 08/22/2016  . Acute ST elevation myocardial infarction (STEMI) involving left anterior descending (LAD) coronary artery ([place]) 08/22/2016  . Hip pain 09/28/2012  . Chronic depression 09/28/2012    Past Surgical History:  Procedure Laterality Date  . APPENDECTOMY    . CARDIAC CATHETERIZATION N/A 08/22/2016   Procedure: Left Heart Cath and Coronary Angiography;  Surgeon: Troy Sine, MD;  Location: [facility];  Service: Cardiovascular;  Laterality: N/A;  . CARDIAC CATHETERIZATION N/A 08/22/2016   Procedure: Coronary Stent Intervention;  Surgeon: Troy Sine, MD;  Location: [facility];  Service: Cardiovascular;  Laterality: N/A;  MID LAD  . HERNIA REPAIR    . parotid gland cyst removal Right 2012  . vocal fold polyp removed         Home Medications    Prior to Admission medications   Medication Sig Start Date End Date Taking? Authorizing Provider  acetaminophen (TYLENOL) 500 MG tablet Take 500 mg by mouth every 6 (six) hours as needed for pain or headache.   Yes Historical Provider, MD  aspirin EC 81 MG EC tablet Take 1 tablet (81 mg total) by mouth daily. 08/26/16  Yes Maryellen Pile, MD  atorvastatin (LIPITOR) 80 MG tablet Take 1 tablet (80 mg total) by mouth daily at 6 PM. 08/25/16  Yes Maryellen Pile, MD  calcium-vitamin D (OSCAL WITH D) 500-200 MG-UNIT per tablet Take 1 tablet  by mouth daily. 1,000 of Vit D3   Yes Historical Provider, MD  cholecalciferol (VITAMIN D) 1000 units tablet Take 1,000 Units by mouth daily.   Yes Historical Provider, MD  clobetasol ointment (TEMOVATE) 2.13 % Apply 1 application topically 2 (two) times daily as needed (rash).  08/30/15  Yes Historical Provider, MD  FLUoxetine (PROZAC) 20 MG capsule Take 1 capsule (20 mg total) by mouth daily. 06/08/16  Yes Wendie Agreste, MD  Ginger Root POWD by Does not apply route. Mixes 1/2 teaspoon of powder into hot tea once daily   Yes Historical Provider, MD  lisinopril  (PRINIVIL,ZESTRIL) 2.5 MG tablet Take 1 tablet (2.5 mg total) by mouth daily. 08/26/16  Yes Maryellen Pile, MD  Multiple Vitamins-Minerals (MULTIVITAMIN WITH MINERALS) tablet Take 1 tablet by mouth once a week. Centrum Silver    Yes Historical Provider, MD  nitroGLYCERIN (NITROSTAT) 0.4 MG SL tablet Place 1 tablet (0.4 mg total) under the tongue every 5 (five) minutes x 3 doses as needed for chest pain. 08/25/16  Yes Maryellen Pile, MD  ticagrelor (BRILINTA) 90 MG TABS tablet Take 1 tablet (90 mg total) by mouth 2 (two) times daily. 08/25/16  Yes Maryellen Pile, MD  vitamin C (ASCORBIC ACID) 500 MG tablet Take 500 mg by mouth daily.   Yes Historical Provider, MD    Family History Family History  Problem Relation Age of Onset  . Dementia Mother   . Cancer Father     skin  . Heart disease Maternal Grandmother   . Stroke Maternal Grandmother   . Cancer Sister     breast (pt did not specific L or R)  . Cancer Brother     bladder and pancreatic  . Heart disease Brother   . Cancer Brother   . Heart disease Sister   . Cancer Sister   . Hypertension Sister   . Cancer Paternal Grandmother     Social History Social History  Substance Use Topics  . Smoking status: Former Smoker    Quit date: 01/22/1967  . Smokeless tobacco: Never Used  . Alcohol use 3.5 oz/week    7 Standard drinks or equivalent per week     Comment: wine: 1 glass daily     Allergies   Patient has no known allergies.   Review of Systems Review of Systems  Constitutional: Negative for diaphoresis.  Respiratory: Negative for shortness of breath.   Cardiovascular: Positive for chest pain.  Gastrointestinal: Negative for abdominal pain, nausea and vomiting.  All other systems reviewed and are negative.    Physical Exam Updated Vital Signs BP 134/66   Pulse (!) 48   Temp 97.7 F (36.5 C) (Oral)   Resp 17   Ht 5\' 8"  (1.727 m)   Wt 163 lb (73.9 kg)   SpO2 99%   BMI 24.78 kg/m   Physical Exam    Constitutional: He is oriented to person, place, and time. He appears well-developed and well-nourished. No distress.  HENT:  Head: Normocephalic and atraumatic.  Cardiovascular: Normal rate, regular rhythm and normal heart sounds.   No murmur heard. Pulmonary/Chest: Effort normal and breath sounds normal. No respiratory distress. He has no wheezes.  Abdominal: Soft. Bowel sounds are normal. There is no tenderness. There is no rebound.  Musculoskeletal: He exhibits no edema.  Neurological: He is alert and oriented to person, place, and time.  Skin: Skin is warm and dry.  Psychiatric: He has a normal mood and affect.  Nursing note and  vitals reviewed.    ED Treatments / Results  DIAGNOSTIC STUDIES:  Oxygen Saturation is 99% on RA, normal by my interpretation.    COORDINATION OF CARE:  11:27 PM Discussed treatment plan with pt at bedside and pt agreed to plan.   Labs (all labs ordered are listed, but only abnormal results are displayed) Labs Reviewed  BASIC METABOLIC PANEL - Abnormal; Notable for the following:       Result Value   Glucose, Bld 139 (*)    All other components within normal limits  CBC - Abnormal; Notable for the following:    RBC 4.17 (*)    HCT 38.3 (*)    All other components within normal limits  I-STAT TROPOININ, ED  I-STAT TROPOININ, ED    EKG  EKG Interpretation  Date/Time:  Sunday January 21 2017 21:40:28 EDT Ventricular Rate:  54 PR Interval:  180 QRS Duration: 96 QT Interval:  428 QTC Calculation: 405 R Axis:   86 Text Interpretation:  Sinus bradycardia ST elevation, consider early repolarization, pericarditis, or injury Abnormal ECG Similar morphology to prior Confirmed by Dina Rich  MD, COURTNEY ([id number]) on 01/21/2017 11:05:51 PM       Radiology Dg Chest 2 View  Result Date: 01/21/2017 CLINICAL DATA:  Chest discomfort.  Myocardial infarction. EXAM: CHEST  2 VIEW COMPARISON:  09/30/2016 CXR FINDINGS: The heart size and mediastinal contours  are within normal limits. Aortic atherosclerosis at the arch without aneurysm. Minimal atelectasis and/or scarring at the left lung base. No pulmonary consolidation, effusion or pneumothorax. The visualized skeletal structures are unremarkable. IMPRESSION: No active cardiopulmonary disease. Minimal left basilar atelectasis and/or scarring. Electronically Signed   By: Ashley Royalty M.D.   On: 01/21/2017 22:20    Procedures Procedures (including critical care time)  Medications Ordered in ED Medications - No data to display   Initial Impression / Assessment and Plan / ED Course  I have reviewed the triage vital signs and the nursing notes.  Pertinent labs & imaging results that were available during my care of the patient were reviewed by me and considered in my medical decision making (see chart for details).  Clinical Course as of Jan 23 147  Sun Jan 21, 2017  2353 Discussed with Dr. Hassell Done, cardiology. Atypical ACS history. Will repeat troponin. If negative patient can be discharged for close follow-up.  [CH]    Clinical Course User Index [CH] Merryl Hacker, MD    Patient presents for chest pain. History is atypical for ACS and not similar to his prior presentation; however, he does have history of recent MI. His EKG is reassuring. Chest x-ray is negative. Initial troponin after 4-5 hours of symptoms is negative. I did discuss the patient with Dr. Hassell Done given his recent history and stent placement. Given the atypical nature of his symptoms and the fact that his first troponin is negative after several hours of pain, will obtain a second troponin as a delta troponin. If negative, patient can be discharged home with close cardiology follow-up. Second troponin is negative. Patient has been pain-free while in the ED. On recheck, he is awake, alert, and oriented. Vital signs are reassuring. He and his wife are reassured. Follow-up with cardiology.  After history, exam, and medical workup I  feel the patient has been appropriately medically screened and is safe for discharge home. Pertinent diagnoses were discussed with the patient. Patient was given return precautions.   Final Clinical Impressions(s) / ED Diagnoses   Final diagnoses:  Atypical chest pain    New Prescriptions New Prescriptions   No medications on file   I personally performed the services described in this documentation, which was scribed in my presence. The recorded information has been reviewed and is accurate.       Merryl Hacker, MD 01/22/17 [phone]

## 2017-01-21 NOTE — ED Triage Notes (Signed)
c/o central chest burning.  Thought he had heartburn.  Took several tums without relief.  Cardiac Hx.

## 2017-01-22 ENCOUNTER — Telehealth: Payer: Self-pay | Admitting: Cardiovascular Disease

## 2017-01-22 ENCOUNTER — Encounter (HOSPITAL_COMMUNITY): Payer: Medicare Other

## 2017-01-22 LAB — I-STAT TROPONIN, ED: TROPONIN I, POC: 0 ng/mL (ref 0.00–0.08)

## 2017-01-22 NOTE — Discharge Instructions (Signed)
You were seen today for chest pain. Your heart tests are reassuring at this time.  Follow-up with your cardiologist later today for further evaluation. If you develop any new or worsening symptoms, worsening pain you should be reevaluated.

## 2017-01-22 NOTE — Telephone Encounter (Signed)
Pt went to [facility] ER last night for discomfort in his chest. Pt said he was advised to call  today and talked to his office and be advised on what to do please.

## 2017-01-22 NOTE — ED Notes (Signed)
Pt stable, ambulatory, states understanding of discharge instructions 

## 2017-01-22 NOTE — Telephone Encounter (Signed)
Returned call to patient-states he was having chest pain yesterday, "discomfort" and felt uneasy about it so he went to ER for evaluation.  Was instructed to follow up with cardiology.  Patient also reports concerns with his HR-states it was in the 55s yesterday in the ED.  (ED note in [place])  Appointment made with Bernerd Pho PA for 4/3 at 1:30 pm at [facility] for follow up.  Patient agrees and verbalized understanding.  Advised to bring medications and BP log.

## 2017-01-23 ENCOUNTER — Ambulatory Visit (INDEPENDENT_AMBULATORY_CARE_PROVIDER_SITE_OTHER): Payer: Medicare Other | Admitting: Student

## 2017-01-23 ENCOUNTER — Encounter: Payer: Self-pay | Admitting: Student

## 2017-01-23 VITALS — BP 120/68 | HR 58 | Ht 68.0 in | Wt 167.4 lb

## 2017-01-23 DIAGNOSIS — E785 Hyperlipidemia, unspecified: Secondary | ICD-10-CM

## 2017-01-23 DIAGNOSIS — I25118 Atherosclerotic heart disease of native coronary artery with other forms of angina pectoris: Secondary | ICD-10-CM

## 2017-01-23 DIAGNOSIS — R001 Bradycardia, unspecified: Secondary | ICD-10-CM | POA: Diagnosis not present

## 2017-01-23 NOTE — Patient Instructions (Signed)
Medication Instructions: Your physician recommends that you continue on your current medications as directed. Please refer to the Current Medication list given to you today.   Follow-Up: Keep follow-up appt with Dr. Claiborne Billings listed below.  If you need a refill on your cardiac medications before your next appointment, please call your pharmacy.

## 2017-01-23 NOTE — Progress Notes (Signed)
Cardiology Office Note    Date:  01/23/2017   ID:  Timothy, Escobar 12-26-1940, MRN [id number]  PCP:  Reginia Forts, MD  Cardiologist:  Dr. Claiborne Billings  Chief Complaint  Patient presents with  . Follow-up    History of Present Illness:    Timothy Escobar is a 76 y.o. male with past medical history of CAD (s/p anterior STEMI in 07/2016 with DESx2 to pLAD and mid-LAD) and Dyslipidemia who presents to the office today for follow-up from the Emergency Department.   He was last seen by Dr. Claiborne Billings in 11/2016 and reported doing well from a cardiac perspective at that time. Was participating in cardiac rehab without any issues. He was continued on ASA, Brilinta, Lipitor 80mg  daily, and Lisinopril 2.5mg  daily. Not on BB therapy secondary to baseline bradycardia.    Notes from 3/26 show his BP went up to 200/62 while exercising but improved to 122/70 with rest. He has since completed Cardiac Rehab Phase 2.   Was seen at [facility] ED on 01/21/2017 for evaluation of a centralized chest pressure which lasted for over 5 hours. Reported symptoms were NOT similar to his prior MI. His pain had spontaneously resolved upon arrival to the ED. CBC and BMET were stable with initial and delta troponin values remaining negative. EKG showed sinus bradycardia, HR 54, with up-sloping ST along inferior and lateral leads, similar to prior tracings.    In talking with the patient today, he denies any recurrent episodes of chest pain since Sunday. Says he never really had pain and describes it more as a "sensation" along his sternum. Says symptoms started after consuming wine and peanuts, lasting for over 6 hours prior to spontaneously resolving. He took TUMS without any relief. Symptoms were not similar to what he experienced in 07/2016. No history of acid reflux.  He just recently completed cardiac rehab and denies any recent chest pain or dyspnea on exertion. Says he reports overall doing well until his most recent  episode. Good compliance with his medications, denying any missed doses of ASA or Brilinta.     Past Medical History:  Diagnosis Date  . Blood transfusion without reported diagnosis   . CAD (coronary artery disease)    a. 08/22/2016 Ant STEMI/PCI: LM nl, LAD 30ost, 100p (3.0x15 Synergy DES & 2.75x24 Synergy DES), LCX large/dominant/nl, RCA nondom, nl;  b. 08/2016 Echo: Ef 60-65%, no rwma.  . Depression   . Dyslipidemia   . Skin cancer    skin cancer    Past Surgical History:  Procedure Laterality Date  . APPENDECTOMY    . CARDIAC CATHETERIZATION N/A 08/22/2016   Procedure: Left Heart Cath and Coronary Angiography;  Surgeon: Troy Sine, MD;  Location: [place] CV LAB;  Service: Cardiovascular;  Laterality: N/A;  . CARDIAC CATHETERIZATION N/A 08/22/2016   Procedure: Coronary Stent Intervention;  Surgeon: Troy Sine, MD;  Location: [place] CV LAB;  Service: Cardiovascular;  Laterality: N/A;  MID LAD  . HERNIA REPAIR    . parotid gland cyst removal Right 2012  . vocal fold polyp removed      Current Medications: Outpatient Medications Prior to Visit  Medication Sig Dispense Refill  . acetaminophen (TYLENOL) 500 MG tablet Take 500 mg by mouth every 6 (six) hours as needed for pain or headache.    Marland Kitchen aspirin EC 81 MG EC tablet Take 1 tablet (81 mg total) by mouth daily. 90 tablet 2  . atorvastatin (LIPITOR) 80 MG tablet  Take 1 tablet (80 mg total) by mouth daily at 6 PM. 90 tablet 2  . calcium-vitamin D (OSCAL WITH D) 500-200 MG-UNIT per tablet Take 1 tablet by mouth daily. 1,000 of Vit D3    . cholecalciferol (VITAMIN D) 1000 units tablet Take 1,000 Units by mouth daily.    . clobetasol ointment (TEMOVATE) 8.78 % Apply 1 application topically 2 (two) times daily as needed (rash).     Marland Kitchen FLUoxetine (PROZAC) 20 MG capsule Take 1 capsule (20 mg total) by mouth daily. 90 capsule 3  . Ginger Root POWD by Does not apply route. Mixes 1/2 teaspoon of powder into hot tea once daily     . lisinopril (PRINIVIL,ZESTRIL) 2.5 MG tablet Take 1 tablet (2.5 mg total) by mouth daily. 90 tablet 2  . Multiple Vitamins-Minerals (MULTIVITAMIN WITH MINERALS) tablet Take 1 tablet by mouth once a week. Centrum Silver     . nitroGLYCERIN (NITROSTAT) 0.4 MG SL tablet Place 1 tablet (0.4 mg total) under the tongue every 5 (five) minutes x 3 doses as needed for chest pain. 30 tablet 12  . ticagrelor (BRILINTA) 90 MG TABS tablet Take 1 tablet (90 mg total) by mouth 2 (two) times daily. 180 tablet 2  . vitamin C (ASCORBIC ACID) 500 MG tablet Take 500 mg by mouth daily.     No facility-administered medications prior to visit.      Allergies:   Patient has no known allergies.   Social History   Social History  . Marital status: Married    Spouse name: N/A  . Number of children: 2  . Years of education: college   Occupational History  . Retired - Water quality scientist    Social History Main Topics  . Smoking status: Former Smoker    Quit date: 01/22/1967  . Smokeless tobacco: Never Used  . Alcohol use 3.5 oz/week    7 Standard drinks or equivalent per week     Comment: wine: 1 glass daily  . Drug use: No  . Sexual activity: No   Other Topics Concern  . None   Social History Narrative   Lives w/ wife in [place]. 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, plus the church choir.   Exercise walk/gardening daily for 1 hr plus     Family History:  The patient's family history includes Cancer in his brother, brother, father, paternal grandmother, sister, and sister; Dementia in his mother; Heart disease in his brother, maternal grandmother, and sister; Hypertension in his sister; Stroke in his maternal grandmother.   Review of Systems:   Please see the history of present illness.     General:  No chills, fever, night sweats or weight changes.  Cardiovascular:  No dyspnea on exertion, edema, orthopnea, palpitations, paroxysmal nocturnal  dyspnea. Positive for chest pain.  Dermatological: No rash, lesions/masses Respiratory: No cough, dyspnea Urologic: No hematuria, dysuria Abdominal:   No nausea, vomiting, diarrhea, bright red blood per rectum, melena, or hematemesis Neurologic:  No visual changes, wkns, changes in mental status. All other systems reviewed and are otherwise negative except as noted above.   Physical Exam:    VS:  BP 120/68   Pulse (!) 58   Ht 5\' 8"  (1.727 m)   Wt 167 lb 6.4 oz (75.9 kg)   BMI 25.45 kg/m    General: Well developed, well nourished Caucasian male appearing in no acute distress. Head: Normocephalic, atraumatic, sclera non-icteric, no xanthomas,  nares are without discharge.  Neck: No carotid bruits. JVD not elevated.  Lungs: Respirations regular and unlabored, without wheezes or rales.  Heart: Regular rate and rhythm. No S3 or S4.  No murmur, no rubs, or gallops appreciated. Abdomen: Soft, non-tender, non-distended with normoactive bowel sounds. No hepatomegaly. No rebound/guarding. No obvious abdominal masses. Msk:  Strength and tone appear normal for age. No joint deformities or effusions. Extremities: No clubbing or cyanosis. No lower extremity edema.  Distal pedal pulses are 2+ bilaterally. Neuro: Alert and oriented X 3. Moves all extremities spontaneously. No focal deficits noted. Psych:  Responds to questions appropriately with a normal affect. Skin: No rashes or lesions noted  Wt Readings from Last 3 Encounters:  01/23/17 167 lb 6.4 oz (75.9 kg)  01/21/17 163 lb (73.9 kg)  01/17/17 167 lb 15.9 oz (76.2 kg)     Studies/Labs Reviewed:   EKG:  EKG is not ordered today. EKG from 01/21/2017 is personally reviewed and shows sinus bradycardia, HR 54, with up-sloping ST along inferior and lateral leads, similar to prior tracings.  Recent Labs: 08/22/2016: Magnesium 2.0; TSH 1.447 08/23/2016: B Natriuretic Peptide 164.0 10/10/2016: ALT 23 01/21/2017: BUN 19; Creatinine, Ser 0.85;  Hemoglobin 13.1; Platelets 162; Potassium 4.0; Sodium 137   Lipid Panel    Component Value Date/Time   CHOL 107 10/10/2016 0951   TRIG 52 10/10/2016 0951   HDL 46 10/10/2016 0951   CHOLHDL 2.3 10/10/2016 0951   VLDL 10 10/10/2016 0951   LDLCALC 51 10/10/2016 0951    Additional studies/ records that were reviewed today include:   Cardiac Catheterization: 08/22/2016  Ost LAD lesion, 30 %stenosed.  Prox LAD-1 lesion, 30 %stenosed.  Mid LAD-1 lesion, 70 %stenosed.  A STENT SYNERGY DES 3X16 drug eluting stent was successfully placed, and does not overlap previously placed stent.  Prox LAD-2 lesion, 100 %stenosed.  Post intervention, there is a 0% residual stenosis.  A STENT SYNERGY DES 2.75X24 drug eluting stent was successfully placed, and overlaps previously placed stent.  Mid LAD-2 lesion, 80 %stenosed.  Post intervention, there is a 0% residual stenosis.   Acute ST segment elevation anterior wall myocardial infarction with 11 mm of ST segment elevation in leads V3 through V6 due to total occlusion of the proximal LAD.  Large dominant normal left circumflex vessel and small nondominant RCA.  Successful PCI to the LAD, treated with PTCA/DES stenting at the site of 100% occlusion with initial insertion of a 3.016 mm Synergy DES stent.  Once  TIMI-3 flow was established it was evident that there was segmental disease beyond the stented segment of 70 and 80% and tandem stenting with a 2.7524 mm Synergy DES stent was done with post stent dilatation from 3.23 mm in the proximal portion of the proximal stent to 3.05 mm at the distal portion of the distal stent.  LVEDP 13 mm Hg.  Door to balloon time: 19 minutes  RECOMMENDATION: The patient will continue with Aggrastat postprocedure due to initial thrombus which had passed distally to the ostium of the second diagonal vessel.  Patient should continue with dual antiplatelet therapy for minimum of a year.  Anticipate  potential excellent recovery of LV function with reperfusion from chest pain onset at approximately one hour.  A 2-D echo Doppler study will be ordered.   Echocardiogram: 08/24/2016 Study Conclusions  - Left ventricle: The cavity size was normal. Systolic function was   normal. The estimated ejection fraction was in the range of 60%  to 65%. Wall motion was normal; there were no regional wall   motion abnormalities. Left ventricular diastolic function   parameters were normal.  Assessment:    1. Coronary artery disease involving native coronary artery of native heart with other form of angina pectoris ([place])   2. Hyperlipidemia with target LDL less than 70   3. Sinus bradycardia      Plan:   In order of problems listed above:  1. Coronary Artery Disease - s/p anterior STEMI in 07/2016 with DESx2 to pLAD and mid-LAD. He had a recent episode of chest pain on 01/21/2017 which lasted for over 6 hours and spontaneously resolved. Basic labs along with initial and delta troponin values remained negative with EKG showing no acute changes. Symptoms were not similar to his prior MI.  - has been exercising daily without recurrent chest pain or dyspnea on exertion. Episode was likely secondary to reflux but he denies any prior episodes of reflux or recurrent symptoms. He wishes to remain off of medications for reflux at this time. Would consider PPI therapy if symptoms reoccur.  - continue ASA, Brilinta, statin, and ACE-I therapy. No BB secondary to baseline bradycardia.   2. HLD - Lipid Panel in 09/2016 showed total cholesterol 107, HDL 46, and LDL 51. - continue Atorvastatin 80mg  daily. At goal with LDL < 70.  3. Sinus Bradycardia - baseline HR in the high-40's to 50's.  - he denies any recent dizziness, lightheadedness, or presyncope.  - avoid AV-nodal blocking agents.    Medication Adjustments/Labs and Tests Ordered: Current medicines are reviewed at length with the patient today.   Concerns regarding medicines are outlined above.  Medication changes, Labs and Tests ordered today are listed in the Patient Instructions below. Patient Instructions  Medication Instructions: Your physician recommends that you continue on your current medications as directed. Please refer to the Current Medication list given to you today.  Follow-Up: Keep follow-up appt with Dr. Claiborne Billings listed below.  If you need a refill on your cardiac medications before your next appointment, please call your pharmacy.  Arna Medici, [place]  01/23/2017 6:55 PM    [facility] [place], [place], [place]  [address] Phone: [phone]; Fax: [phone]  [address], [place], [place] [address] Phone: [phone]

## 2017-01-24 ENCOUNTER — Encounter (HOSPITAL_COMMUNITY): Payer: Medicare Other

## 2017-01-26 ENCOUNTER — Encounter (HOSPITAL_COMMUNITY): Payer: Medicare Other

## 2017-01-29 ENCOUNTER — Encounter (HOSPITAL_COMMUNITY): Payer: Medicare Other

## 2017-01-31 ENCOUNTER — Encounter (HOSPITAL_COMMUNITY): Payer: Medicare Other

## 2017-02-02 ENCOUNTER — Encounter (HOSPITAL_COMMUNITY): Payer: Medicare Other

## 2017-02-15 ENCOUNTER — Emergency Department (HOSPITAL_COMMUNITY): Payer: Medicare Other

## 2017-02-15 ENCOUNTER — Emergency Department (HOSPITAL_COMMUNITY)
Admission: EM | Admit: 2017-02-15 | Discharge: 2017-02-15 | Disposition: A | Discharge status: Home / Self Care | Payer: Medicare Other | Attending: Emergency Medicine | Admitting: Emergency Medicine

## 2017-02-15 ENCOUNTER — Encounter (HOSPITAL_COMMUNITY): Payer: Self-pay | Admitting: Emergency Medicine

## 2017-02-15 ENCOUNTER — Telehealth: Payer: Self-pay | Admitting: Cardiovascular Disease

## 2017-02-15 DIAGNOSIS — I2510 Atherosclerotic heart disease of native coronary artery without angina pectoris: Secondary | ICD-10-CM | POA: Insufficient documentation

## 2017-02-15 DIAGNOSIS — Z79899 Other long term (current) drug therapy: Secondary | ICD-10-CM | POA: Diagnosis not present

## 2017-02-15 DIAGNOSIS — Z87891 Personal history of nicotine dependence: Secondary | ICD-10-CM | POA: Diagnosis not present

## 2017-02-15 DIAGNOSIS — Z7982 Long term (current) use of aspirin: Secondary | ICD-10-CM | POA: Insufficient documentation

## 2017-02-15 DIAGNOSIS — Z85828 Personal history of other malignant neoplasm of skin: Secondary | ICD-10-CM | POA: Diagnosis not present

## 2017-02-15 DIAGNOSIS — Z955 Presence of coronary angioplasty implant and graft: Secondary | ICD-10-CM | POA: Insufficient documentation

## 2017-02-15 DIAGNOSIS — I252 Old myocardial infarction: Secondary | ICD-10-CM | POA: Diagnosis not present

## 2017-02-15 DIAGNOSIS — R791 Abnormal coagulation profile: Secondary | ICD-10-CM | POA: Insufficient documentation

## 2017-02-15 DIAGNOSIS — R42 Dizziness and giddiness: Secondary | ICD-10-CM | POA: Insufficient documentation

## 2017-02-15 LAB — COMPREHENSIVE METABOLIC PANEL
ALBUMIN: 4 g/dL (ref 3.5–5.0)
ALT: 28 U/L (ref 17–63)
AST: 28 U/L (ref 15–41)
Alkaline Phosphatase: 73 U/L (ref 38–126)
Anion gap: 8 (ref 5–15)
BILIRUBIN TOTAL: 0.9 mg/dL (ref 0.3–1.2)
BUN: 16 mg/dL (ref 6–20)
CHLORIDE: 106 mmol/L (ref 101–111)
CO2: 25 mmol/L (ref 22–32)
Calcium: 8.8 mg/dL — ABNORMAL LOW (ref 8.9–10.3)
Creatinine, Ser: 0.88 mg/dL (ref 0.61–1.24)
GFR calc Af Amer: >60 mL/min (ref >60)
GLUCOSE: 117 mg/dL — AB (ref 65–99)
Potassium: 3.8 mmol/L (ref 3.5–5.1)
Sodium: 139 mmol/L (ref 135–145)
Total Protein: 6 g/dL — ABNORMAL LOW (ref 6.5–8.1)

## 2017-02-15 LAB — DIFFERENTIAL
BASOS ABS: 0 10*3/uL (ref 0.0–0.1)
BASOS PCT: 1 %
Eosinophils Absolute: 0.2 10*3/uL (ref 0.0–0.7)
Eosinophils Relative: 4 %
Lymphocytes Relative: 17 %
Lymphs Abs: 0.7 10*3/uL (ref 0.7–4.0)
MONOS PCT: 9 %
Monocytes Absolute: 0.4 10*3/uL (ref 0.1–1.0)
NEUTROS ABS: 2.9 10*3/uL (ref 1.7–7.7)
NEUTROS PCT: 69 %

## 2017-02-15 LAB — CBC
HCT: 38.2 % — ABNORMAL LOW (ref 39.0–52.0)
HEMOGLOBIN: 12.9 g/dL — AB (ref 13.0–17.0)
MCH: 31.2 pg (ref 26.0–34.0)
MCHC: 33.8 g/dL (ref 30.0–36.0)
MCV: 92.3 fL (ref 78.0–100.0)
PLATELETS: 170 10*3/uL (ref 150–400)
RBC: 4.14 MIL/uL — AB (ref 4.22–5.81)
RDW: 13.3 % (ref 11.5–15.5)
WBC: 4.1 10*3/uL (ref 4.0–10.5)

## 2017-02-15 LAB — URINALYSIS, ROUTINE W REFLEX MICROSCOPIC
Bilirubin Urine: NEGATIVE
Glucose, UA: NEGATIVE mg/dL
HGB URINE DIPSTICK: NEGATIVE
Ketones, ur: NEGATIVE mg/dL
LEUKOCYTES UA: NEGATIVE
NITRITE: NEGATIVE
PROTEIN: NEGATIVE mg/dL
Specific Gravity, Urine: 1.01 (ref 1.005–1.030)
pH: 7 (ref 5.0–8.0)

## 2017-02-15 LAB — CBG MONITORING, ED: GLUCOSE-CAPILLARY: 128 mg/dL — AB (ref 65–99)

## 2017-02-15 LAB — I-STAT TROPONIN, ED
TROPONIN I, POC: 0 ng/mL (ref 0.00–0.08)
Troponin i, poc: 0 ng/mL (ref 0.00–0.08)

## 2017-02-15 LAB — PROTIME-INR
INR: 1.14
PROTHROMBIN TIME: 14.7 s (ref 11.4–15.2)

## 2017-02-15 LAB — APTT: aPTT: 27 seconds (ref 24–36)

## 2017-02-15 MED ORDER — SODIUM CHLORIDE 0.9 % IV BOLUS (SEPSIS)
500.0000 mL | Freq: Once | INTRAVENOUS | Status: AC
  Administered 2017-02-15: 500 mL via INTRAVENOUS

## 2017-02-15 NOTE — ED Triage Notes (Signed)
Pt presents to ED for assessment after experiencing sudden onset of dizziness after he was knelt down working on a project on the ground.  Patient states his dizziness did not resolve with eating food.  No other neurological deficits noted, no drift, no facial droop, no slurred speech, no impaired gait.

## 2017-02-15 NOTE — ED Notes (Signed)
Patient states he doesn't feel dizzy standing , actually states he is getting hungry.

## 2017-02-15 NOTE — Telephone Encounter (Signed)
Patient calling and states that he feels dizzy and has been feeling this way for about an hour. Thanks.

## 2017-02-15 NOTE — Telephone Encounter (Signed)
Spoke with pt states today he has been working on a project at home and is standing, bending and sitting and is feeling dizzy and vertigo'ish per pt. Denies any other sx chest pain or pressure, nausea, etc..the patient states that he went on a mile + walk this morning and he felt "great"! But he states that his BP now is 125/76 and HR 51. Pt states that he does not know why he feels this way and thinks that he should go to the ER. I told pt that if he feels that he needs to go to the ER he should go. Informed cardmaster that he is going to go.

## 2017-02-15 NOTE — ED Provider Notes (Signed)
[place] DEPT Provider Note   CSN: [id number] Arrival date & time: 02/15/17  1505     History   Chief Complaint Chief Complaint  Patient presents with  . Dizziness    HPI Timothy Escobar is a 76 y.o. male.  The history is provided by the patient, the spouse and medical records.  Dizziness  Quality:  Head spinning Severity:  Moderate Onset quality:  Unable to specify Duration:  3 hours Timing:  Constant (waxing and waning in severity) Progression:  Resolved Chronicity:  New Relieved by: a nap. Worsened by:  Nothing Ineffective treatments:  None tried Associated symptoms: no chest pain, no headaches, no nausea, no palpitations, no shortness of breath, no syncope, no vision changes, no vomiting and no weakness   Risk factors: heart disease and multiple medications     Past Medical History:  Diagnosis Date  . Blood transfusion without reported diagnosis   . CAD (coronary artery disease)    a. 08/22/2016 Ant STEMI/PCI: LM nl, LAD 30ost, 100p (3.0x15 Synergy DES & 2.75x24 Synergy DES), LCX large/dominant/nl, RCA nondom, nl;  b. 08/2016 Echo: Ef 60-65%, no rwma.  . Depression   . Dyslipidemia   . Skin cancer    skin cancer    Patient Active Problem List   Diagnosis Date Noted  . Sinus bradycardia 01/23/2017  . Acute bronchitis 11/21/2016  . Dyslipidemia   . CAD (coronary artery disease)   . Status post coronary artery stent placement   . Bradycardia   . Hypokalemia   . Hyperlipidemia with target LDL less than 70   . STEMI (ST elevation myocardial infarction) ([place]) 08/22/2016  . Acute ST elevation myocardial infarction (STEMI) involving left anterior descending (LAD) coronary artery ([place]) 08/22/2016  . Hip pain 09/28/2012  . Chronic depression 09/28/2012    Past Surgical History:  Procedure Laterality Date  . APPENDECTOMY    . CARDIAC CATHETERIZATION N/A 08/22/2016   Procedure: Left Heart Cath and Coronary Angiography;  Surgeon: Troy Sine, MD;   Location: [place] CV LAB;  Service: Cardiovascular;  Laterality: N/A;  . CARDIAC CATHETERIZATION N/A 08/22/2016   Procedure: Coronary Stent Intervention;  Surgeon: Troy Sine, MD;  Location: [place] CV LAB;  Service: Cardiovascular;  Laterality: N/A;  MID LAD  . HERNIA REPAIR    . parotid gland cyst removal Right 2012  . vocal fold polyp removed         Home Medications    Prior to Admission medications   Medication Sig Start Date End Date Taking? Authorizing Provider  acetaminophen (TYLENOL) 500 MG tablet Take 500 mg by mouth every 6 (six) hours as needed for pain or headache.    Historical Provider, MD  aspirin EC 81 MG EC tablet Take 1 tablet (81 mg total) by mouth daily. 08/26/16   Maryellen Pile, MD  atorvastatin (LIPITOR) 80 MG tablet Take 1 tablet (80 mg total) by mouth daily at 6 PM. 08/25/16   Maryellen Pile, MD  calcium-vitamin D (OSCAL WITH D) 500-200 MG-UNIT per tablet Take 1 tablet by mouth daily. 1,000 of Vit D3    Historical Provider, MD  cholecalciferol (VITAMIN D) 1000 units tablet Take 1,000 Units by mouth daily.    Historical Provider, MD  clobetasol ointment (TEMOVATE) 1.30 % Apply 1 application topically 2 (two) times daily as needed (rash).  08/30/15   Historical Provider, MD  FLUoxetine (PROZAC) 20 MG capsule Take 1 capsule (20 mg total) by mouth daily. 06/08/16   Ranell Patrick  Carlota Raspberry, MD  Ginger Root POWD by Does not apply route. Mixes 1/2 teaspoon of powder into hot tea once daily    Historical Provider, MD  lisinopril (PRINIVIL,ZESTRIL) 2.5 MG tablet Take 1 tablet (2.5 mg total) by mouth daily. 08/26/16   Maryellen Pile, MD  Multiple Vitamins-Minerals (MULTIVITAMIN WITH MINERALS) tablet Take 1 tablet by mouth once a week. Centrum Silver     Historical Provider, MD  nitroGLYCERIN (NITROSTAT) 0.4 MG SL tablet Place 1 tablet (0.4 mg total) under the tongue every 5 (five) minutes x 3 doses as needed for chest pain. 08/25/16   Maryellen Pile, MD  ticagrelor (BRILINTA)  90 MG TABS tablet Take 1 tablet (90 mg total) by mouth 2 (two) times daily. 08/25/16   Maryellen Pile, MD  vitamin C (ASCORBIC ACID) 500 MG tablet Take 500 mg by mouth daily.    Historical Provider, MD    Family History Family History  Problem Relation Age of Onset  . Dementia Mother   . Cancer Father     skin  . Heart disease Maternal Grandmother   . Stroke Maternal Grandmother   . Cancer Sister     breast (pt did not specific L or R)  . Cancer Brother     bladder and pancreatic  . Heart disease Brother   . Cancer Brother   . Heart disease Sister   . Cancer Sister   . Hypertension Sister   . Cancer Paternal Grandmother     Social History Social History  Substance Use Topics  . Smoking status: Former Smoker    Quit date: 01/22/1967  . Smokeless tobacco: Never Used  . Alcohol use 3.5 oz/week    7 Standard drinks or equivalent per week     Comment: wine: 1 glass daily     Allergies   Patient has no known allergies.   Review of Systems Review of Systems  Constitutional: Negative for chills and fever.  HENT: Negative for ear pain and sore throat.   Eyes: Negative for pain and visual disturbance.  Respiratory: Negative for cough and shortness of breath.   Cardiovascular: Negative for chest pain, palpitations and syncope.  Gastrointestinal: Negative for abdominal pain, nausea and vomiting.  Genitourinary: Negative for dysuria and hematuria.  Musculoskeletal: Negative for arthralgias and back pain.  Skin: Negative for color change and rash.  Neurological: Positive for dizziness. Negative for seizures, syncope, weakness and headaches.  All other systems reviewed and are negative.    Physical Exam Updated Vital Signs BP 100/65 (BP Location: Right Arm)   Pulse (!) 51   Temp 97.8 F (36.6 C) (Oral)   Resp 16   SpO2 97%   Physical Exam  Constitutional: He is oriented to person, place, and time. He appears well-developed and well-nourished.  HENT:  Head:  Normocephalic and atraumatic.  Eyes: Conjunctivae are normal.  Neck: Neck supple.  Cardiovascular: Regular rhythm.   No murmur heard. Bradycardic in the 50s  Pulmonary/Chest: Effort normal and breath sounds normal. No respiratory distress.  Abdominal: Soft. There is no tenderness.  Musculoskeletal: He exhibits no edema.  Neurological: He is alert and oriented to person, place, and time.  CN 2-12 grossly intact, 5/5 strength in all 4ext, no focal sensory deficits, intact FTN, gait deferred  Skin: Skin is warm and dry.  Psychiatric: He has a normal mood and affect.  Nursing note and vitals reviewed.    ED Treatments / Results  Labs (all labs ordered are listed, but only abnormal results  are displayed) Labs Reviewed  CBC - Abnormal; Notable for the following:       Result Value   RBC 4.14 (*)    Hemoglobin 12.9 (*)    HCT 38.2 (*)    All other components within normal limits  COMPREHENSIVE METABOLIC PANEL - Abnormal; Notable for the following:    Glucose, Bld 117 (*)    Calcium 8.8 (*)    Total Protein 6.0 (*)    All other components within normal limits  PROTIME-INR  APTT  DIFFERENTIAL  URINALYSIS, ROUTINE W REFLEX MICROSCOPIC  I-STAT TROPOININ, ED  CBG MONITORING, ED    EKG  EKG Interpretation  Date/Time:  Thursday February 15 2017 15:11:54 EDT Ventricular Rate:  48 PR Interval:  196 QRS Duration: 98 QT Interval:  460 QTC Calculation: 410 R Axis:   85 Text Interpretation:  Sinus bradycardia Early repolarization Otherwise normal ECG Confirmed by Jeneen Rinks  MD, [place] ([id number]) on 02/15/2017 3:16:42 PM       Radiology Ct Head Wo Contrast  Result Date: 02/15/2017 CLINICAL DATA:  Dizziness starting today. EXAM: CT HEAD WITHOUT CONTRAST TECHNIQUE: Contiguous axial images were obtained from the base of the skull through the vertex without intravenous contrast. COMPARISON:  No comparison studies available. FINDINGS: Brain: There is no evidence for acute hemorrhage,  hydrocephalus, mass lesion, or abnormal extra-axial fluid collection. No definite CT evidence for acute infarction. Diffuse loss of parenchymal volume is consistent with atrophy. Vascular: No hyperdense vessel or unexpected calcification. Skull: No evidence for fracture. No worrisome lytic or sclerotic lesion. Sinuses/Orbits: The visualized paranasal sinuses and mastoid air cells are clear. Visualized portions of the globes and intraorbital fat are unremarkable. Other: None. IMPRESSION: 1. Stable.  No acute intracranial abnormality. 2. Atrophy. Electronically Signed   By: Misty Stanley M.D.   On: 02/15/2017 15:46    Procedures Procedures (including critical care time)  Medications Ordered in ED Medications - No data to display   Initial Impression / Assessment and Plan / ED Course  I have reviewed the triage vital signs and the nursing notes.  Pertinent labs & imaging results that were available during my care of the patient were reviewed by me and considered in my medical decision making (see chart for details).    Pt with h/o CAD & baseline bradycardia presents with dizziness. Says he felt great this morning and went for a brisk 35mile walk. When he got home he started doing some chores around the house. While he was bent over removing the glass from a door, he started to feel dizzy; he stood up, rested, ate some lunch, and took a shower but his symptoms persisted, so he decided to come to the ED for evaluation. Denies F/C, HA, cough, CP, palpitations, SOB, N/V/D, urinary symptoms, recent illness, or sick contacts. Pt asymptomatic on presentation, and says his symptoms resolved when he took a nap in the ED waiting room.  VS & exam as above. 500cc bolus given. EKG: SB @ 46bpm w/o signs of ischemia; similar in appearance to tracing from 01/21/17. CT head negative. Labs unremarkable, including two undetectable troponins. MRI head ordered to rule out central causes of dizziness & was normal. Cause of  the Pt's symptoms unclear, but doubt emergent etiology given history, exam, and workup; probably related to dehydration given increased exercise today.  Explained all results to the Pt & wife. Will discharge the Pt home. Recommending follow-up with PCP. ED return precautions provided. Pt acknowledged understanding of, and concurrence with the  plan. All questions answered to his satisfaction. In stable condition at the time of discharge.  Final Clinical Impressions(s) / ED Diagnoses   Final diagnoses:  Dizziness    New Prescriptions Discharge Medication List as of 02/15/2017  9:31 PM       Jenny Reichmann, MD 02/16/17 [id number]    Gareth Morgan, MD 02/23/17 1227

## 2017-02-15 NOTE — ED Notes (Signed)
Patient left at this time with all belongings. 

## 2017-02-15 NOTE — ED Notes (Signed)
Patient states he got up this am was feeling great went for a 1 mile walk came home was replacing some glass in some was leaning over and became dizzy, states he ate lunch thought he would feel better. However the dizziness didn't go away. States he feels like he was spinning when he stood up. States he feels better now.

## 2017-02-20 NOTE — Telephone Encounter (Signed)
Patient was seen in the ED. 

## 2017-02-21 NOTE — Telephone Encounter (Signed)
thx for letting me know 

## 2017-02-27 ENCOUNTER — Ambulatory Visit: Payer: Medicare Other | Admitting: Audiology

## 2017-04-09 ENCOUNTER — Ambulatory Visit (INDEPENDENT_AMBULATORY_CARE_PROVIDER_SITE_OTHER): Payer: Medicare Other | Admitting: Cardiovascular Disease

## 2017-04-09 ENCOUNTER — Encounter: Payer: Self-pay | Admitting: Cardiovascular Disease

## 2017-04-09 VITALS — BP 122/70 | HR 51 | Ht 68.0 in | Wt 165.0 lb

## 2017-04-09 DIAGNOSIS — R001 Bradycardia, unspecified: Secondary | ICD-10-CM | POA: Diagnosis not present

## 2017-04-09 DIAGNOSIS — E785 Hyperlipidemia, unspecified: Secondary | ICD-10-CM

## 2017-04-09 DIAGNOSIS — I2510 Atherosclerotic heart disease of native coronary artery without angina pectoris: Secondary | ICD-10-CM

## 2017-04-09 DIAGNOSIS — I2109 ST elevation (STEMI) myocardial infarction involving other coronary artery of anterior wall: Secondary | ICD-10-CM | POA: Diagnosis not present

## 2017-04-09 NOTE — Patient Instructions (Signed)
Medication Instructions:   NO CHANGE  Labwork:  Your physician recommends that you return for lab work [place] UP APPOINTMENT= DO NOT EAT PRIOR TO LAB WORK  Follow-Up:  Your physician wants you to follow-up in: [place] will receive a reminder letter in the mail two months in advance. If you don't receive a letter, please call our office to schedule the follow-up appointment.   If you need a refill on your cardiac medications before your next appointment, please call your pharmacy.

## 2017-04-09 NOTE — Progress Notes (Signed)
Cardiology Office Note    Date:  04/10/2017   ID:  Timothy Escobar [birth year], MRN [id number]  PCP:  Wardell Honour, MD  Cardiologist:  Shelva Majestic, MD   Initial F/U post Anterior MI  History of Present Illness:  Timothy Escobar is a 76 y.o. male who sufferred an Anterior STEMI on 08/22/16 who presents for a 4 month follow-up evaluation.   [name] is a 76 year old Caucasian male who was without cardiac history and developed acute onset of severe substernal chest pressure while he was moving logs in a wheelbarrow which commenced at approximately 5 PM on 08/22/16. EMS arrived within 15 minutes. His ECG in transient showed 11 mm of ST segment elevation in leads V3 through V6 consistent with an acute anterior injury current. A code STEMI was activated and he was brought emergently directly to the catheterization laboratory where I performed emergent catheterization. Catheterization revealed total occlusion of a large proximal LAD with TIMI 0 flow and a large dominant normal left circumflex vessel and small nondominant RCA. He underwent successful PCI to the LAD, treated with PTCA/DES stenting at the site of 100% occlusion with initial insertion of a 3.016 mm Synergy DES stent.  Once  TIMI-3 flow was established it was evident that there was segmental disease beyond the stented segment of 70 and 80% and tandem stenting with a 2.7524 mm Synergy DES stent was done with post stent dilatation from 3.23 mm in the proximal portion of the proximal stent to 3.05 mm at the distal portion of the distal stent. LVEDP was 13 mm Hg and the door to balloon time from arrival was 19 minutes. DTB from chest pain onset prior to EMS arrival was < 1 hour.  He ultimately completely resolved his 11 mm ST elevation, ECG normalized, and subsequent echo showed completely normal LV fx with EF 60-65% without any wall motion abnormality! He was discharged 2 days later.  Since his event he has remained  asymptomatic. He is participating in cardiac rehab. He continues to sing in the [employer] He was seen by Angelica Ran, NP on 08/29/2016.  I saw him in follow-up in February 2018.  Since I last saw him, he has remained very active.  He walks at least 2 miles per day, does gardening, and recently his building stairs at his house.  He has not been having any chest pain or dyspnea. He denies PND, orthopnea, dizziness, syncope, edema, or early satiety. He is tolerating his medications. He presents for follow-up evaluation.  Past Medical History:  Diagnosis Date  . Blood transfusion without reported diagnosis   . CAD (coronary artery disease)    a. 08/22/2016 Ant STEMI/PCI: LM nl, LAD 30ost, 100p (3.0x15 Synergy DES & 2.75x24 Synergy DES), LCX large/dominant/nl, RCA nondom, nl;  b. 08/2016 Echo: Ef 60-65%, no rwma.  . Depression   . Dyslipidemia   . Skin cancer    skin cancer    Past Surgical History:  Procedure Laterality Date  . APPENDECTOMY    . CARDIAC CATHETERIZATION N/A 08/22/2016   Procedure: Left Heart Cath and Coronary Angiography;  Surgeon: Troy Sine, MD;  Location: [facility];  Service: Cardiovascular;  Laterality: N/A;  . CARDIAC CATHETERIZATION N/A 08/22/2016   Procedure: Coronary Stent Intervention;  Surgeon: Troy Sine, MD;  Location: [facility];  Service: Cardiovascular;  Laterality: N/A;  MID LAD  . HERNIA REPAIR    . parotid gland cyst removal  Right 2012  . vocal fold polyp removed      Current Medications: Outpatient Medications Prior to Visit  Medication Sig Dispense Refill  . acetaminophen (TYLENOL) 500 MG tablet Take 500 mg by mouth every 6 (six) hours as needed for pain or headache.    Marland Kitchen aspirin EC 81 MG EC tablet Take 1 tablet (81 mg total) by mouth daily. 90 tablet 2  . atorvastatin (LIPITOR) 80 MG tablet Take 1 tablet (80 mg total) by mouth daily at 6 PM. 90 tablet 2  . calcium-vitamin D (OSCAL WITH D) 500-200 MG-UNIT per tablet  Take 1 tablet by mouth daily. 1,000 of Vit D3    . cholecalciferol (VITAMIN D) 1000 units tablet Take 1,000 Units by mouth daily.    . clobetasol ointment (TEMOVATE) 0.81 % Apply 1 application topically 2 (two) times daily as needed (rash).     Marland Kitchen FLUoxetine (PROZAC) 20 MG capsule Take 1 capsule (20 mg total) by mouth daily. 90 capsule 3  . Ginger Root POWD by Does not apply route. Mixes 1/2 teaspoon of powder into hot tea once daily    . lisinopril (PRINIVIL,ZESTRIL) 2.5 MG tablet Take 1 tablet (2.5 mg total) by mouth daily. 90 tablet 2  . Multiple Vitamins-Minerals (MULTIVITAMIN WITH MINERALS) tablet Take 1 tablet by mouth once a week. Centrum Silver     . nitroGLYCERIN (NITROSTAT) 0.4 MG SL tablet Place 1 tablet (0.4 mg total) under the tongue every 5 (five) minutes x 3 doses as needed for chest pain. 30 tablet 12  . ticagrelor (BRILINTA) 90 MG TABS tablet Take 1 tablet (90 mg total) by mouth 2 (two) times daily. 180 tablet 2  . vitamin C (ASCORBIC ACID) 500 MG tablet Take 500 mg by mouth daily.     No facility-administered medications prior to visit.      Allergies:   Patient has no known allergies.   Social History   Social History  . Marital status: Married    Spouse name: N/A  . Number of children: 2  . Years of education: college   Occupational History  . Retired - Water quality scientist    Social History Main Topics  . Smoking status: Former Smoker    Quit date: 01/22/1967  . Smokeless tobacco: Never Used  . Alcohol use 3.5 oz/week    7 Standard drinks or equivalent per week     Comment: wine: 1 glass daily  . Drug use: No  . Sexual activity: No   Other Topics Concern  . None   Social History Narrative   Lives w/ wife in [place]. 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, plus the church choir.   Exercise walk/gardening daily for 1 hr plus     Family History:  The patient's family history includes Cancer in his brother,  brother, father, paternal grandmother, sister, and sister; Dementia in his mother; Heart disease in his brother, maternal grandmother, and sister; Hypertension in his sister; Stroke in his maternal grandmother.   ROS General: Negative; No fevers, chills, or night sweats;  HEENT: Negative; No changes in vision or hearing, sinus congestion, difficulty swallowing Pulmonary: Negative; No cough, wheezing, shortness of breath, hemoptysis Cardiovascular: Negative; No chest pain, presyncope, syncope, palpitations GI: Negative; No nausea, vomiting, diarrhea, or abdominal pain GU: Negative; No dysuria, hematuria, or difficulty voiding Musculoskeletal: Negative; no myalgias, joint pain, or weakness Hematologic/Oncology: Negative; no easy bruising, bleeding Endocrine: Negative; no heat/cold intolerance; no  diabetes Neuro: Negative; no changes in balance, headaches Skin: Negative; No rashes or skin lesions Psychiatric: Negative; No behavioral problems, depression Sleep: Negative; No snoring, daytime sleepiness, hypersomnolence, bruxism, restless legs, hypnogognic hallucinations, no cataplexy Other comprehensive 14 point system review is negative.   PHYSICAL EXAM:   VS:  BP 122/70   Pulse (!) 51   Ht _0  (1.727 m)   Wt 165 lb (74.8 kg)   BMI 25.09 kg/m    Repeat blood pressure by me was 128/70  Wt Readings from Last 3 Encounters:  04/09/17 165 lb (74.8 kg)  01/23/17 167 lb 6.4 oz (75.9 kg)  01/21/17 163 lb (73.9 kg)    General: Alert, oriented, no distress.  Skin: normal turgor, no rashes, warm and dry HEENT: Normocephalic, atraumatic. Pupils equal round and reactive to light; sclera anicteric; extraocular muscles intact Nose without nasal septal hypertrophy Mouth/Parynx benign; Mallinpatti scale 3 Neck: No JVD, no carotid bruits; normal carotid upstroke Lungs: clear to ausculatation and percussion; no wheezing or rales Chest wall: without tenderness to palpitation Heart: PMI not  displaced, RRR, s1 s2 normal, 1/6 systolic murmur, no diastolic murmur, no rubs, gallops, thrills, or heaves Abdomen: soft, nontender; no hepatosplenomehaly, BS+; abdominal aorta nontender and not dilated by palpation. Back: no CVA tenderness Pulses 2+ Musculoskeletal: full range of motion, normal strength, no joint deformities Extremities: no clubbing cyanosis or edema, Homan's sign negative  Neurologic: grossly nonfocal; Cranial nerves grossly wnl Psychologic: Normal mood and affect    Studies/Labs Reviewed:   EKG:  EKG is ordered today. ECG (independently read by me): Sinus bradycardia 51 bpm.  No ST segment changes.  Normal intervals.  February 2018 ECG (independently read by me): Sinus bradycardia 53 bpm.  Normal ECG.  Early repolarization.  No evidence for prior infarction.  Recent Labs: BMP Latest Ref Rng & Units 02/15/2017 01/21/2017 09/30/2016  Glucose 65 - 99 mg/dL 117(H) 139(H) 107(H)  BUN 6 - 20 mg/dL _1 Creatinine 0.61 - 1.24 mg/dL 0.88 0.85 0.86  Sodium 135 - 145 mmol/L 139 137 138  Potassium 3.5 - 5.1 mmol/L 3.8 4.0 3.9  Chloride 101 - 111 mmol/L 106 103 105  CO2 22 - 32 mmol/L _2 Calcium 8.9 - 10.3 mg/dL 8.8(L) 9.0 8.9     Hepatic Function Latest Ref Rng & Units 02/15/2017 10/10/2016 08/22/2016  Total Protein 6.5 - 8.1 g/dL 6.0(L) 6.2 6.3(L)  Albumin 3.5 - 5.0 g/dL 4.0 4.1 4.0  AST 15 - 41 U/L _3 ALT 17 - 63 U/L _4 Alk Phosphatase 38 - 126 U/L 73 61 73  Total Bilirubin 0.3 - 1.2 mg/dL 0.9 0.7 0.5  Bilirubin, Direct <=0.2 mg/dL - 0.2 -    CBC Latest Ref Rng & Units 02/15/2017 01/21/2017 09/30/2016  WBC 4.0 - 10.5 K/uL 4.1 5.3 6.7  Hemoglobin 13.0 - 17.0 g/dL 12.9(L) 13.1 15.3  Hematocrit 39.0 - 52.0 % 38.2(L) 38.3(L) 44.6  Platelets 150 - 400 K/uL 170 162 183   Lab Results  Component Value Date   MCV 92.3 02/15/2017   MCV 91.8 01/21/2017   MCV 92.9 09/30/2016   Lab Results  Component Value Date   TSH 1.447 08/22/2016   Lab  Results  Component Value Date   HGBA1C 5.5 08/23/2016     BNP    Component Value Date/Time   BNP 164.0 (H) 08/23/2016 0200    ProBNP No results found for: PROBNP   Lipid Panel  Component Value Date/Time   CHOL 107 10/10/2016 0951   TRIG 52 10/10/2016 0951   HDL 46 10/10/2016 0951   CHOLHDL 2.3 10/10/2016 0951   VLDL 10 10/10/2016 0951   LDLCALC 51 10/10/2016 0951     RADIOLOGY: No results found.   ASSESSMENT:    1. Coronary artery disease involving native coronary artery of native heart without angina pectoris   2. ST elevation myocardial infarction (STEMI) of anterior wall, subsequent episode of care [facility]) 08/22/2016:  S/P PCI to LAD with complete salvage of myocardium   3. Hyperlipidemia with target LDL less than 70   4. Sinus bradycardia      PLAN:  Mr Petrich Is a 76 year old Caucasian male who develeoped an ACS/Anterior STEMI with 11 mm ST elevation precordially on Halloween 2017. Fortunately he acted quickly and was prompty brought to [facility] cath lab where I performed emergent cath/PCI and had an DTB time of 19 minutes from arrival to the lab and his chest pain duration was less than 60 minutes. Due to disease beyond the initial occlusion he underwent tandem DES stenting with an excellent result.  He has been demonstrated to have complete myocardial salvage and his ECG is normal.  He denies any recurrent episodes of chest pain.  He denies palpitations.  He remains active and is walking at least 2 miles per day, does gardening, and frequent projects around his house.  His blood pressure today is stable on his current regimen consisting of lisinopril 2.5 mg daily.  He continues to be on aspirin and full dose Brilinta 90 mg twice a day.  After 1 year, if he remains stable I will  decreased Brilinta to 60 mg twice a day per [place] trial data.  He is on atorvastatin 80 mg daily for high potency statin therapy.  LDL in December 2017, was excellent at 51.  He participated in  cardiac rehabilitation.  He will continue with current therapy.  I will repeat laboratory in 6 months.  I will see him in 6 months for follow-up evaluation.    Medication Adjustments/Labs and Tests Ordered: Current medicines are reviewed at length with the patient today.  Concerns regarding medicines are outlined above.  Medication changes, Labs and Tests ordered today are listed in the Patient Instructions below.  Patient Instructions  Medication Instructions:   NO CHANGE  Labwork:  Your physician recommends that you return for lab work [place] UP APPOINTMENT= DO NOT EAT PRIOR TO LAB WORK  Follow-Up:  Your physician wants you to follow-up in: [place] will receive a reminder letter in the mail two months in advance. If you don't receive a letter, please call our office to schedule the follow-up appointment.   If you need a refill on your cardiac medications before your next appointment, please call your pharmacy.       Signed, Shelva Majestic, MD , [facility] 04/10/2017 9:12 AM    [facility] [address] [place], [place], [place]  [address] Phone: [phone]

## 2017-05-18 ENCOUNTER — Other Ambulatory Visit: Payer: Self-pay | Admitting: Internal Medicine

## 2017-05-21 ENCOUNTER — Other Ambulatory Visit: Payer: Self-pay | Admitting: Cardiovascular Disease

## 2017-05-21 MED ORDER — LISINOPRIL 2.5 MG PO TABS: 2.5000 mg | ORAL_TABLET | Freq: Every day | ORAL | 2 refills | Status: DC

## 2017-05-21 NOTE — Telephone Encounter (Signed)
Pt calling requesting a refill on lisionpril. Please address. Thank you

## 2017-05-21 NOTE — Telephone Encounter (Signed)
Rx has been sent to the pharmacy electronically. Pt made aware 

## 2017-06-06 DIAGNOSIS — H524 Presbyopia: Secondary | ICD-10-CM | POA: Diagnosis not present

## 2017-06-21 ENCOUNTER — Other Ambulatory Visit: Payer: Self-pay | Admitting: Internal Medicine

## 2017-06-28 ENCOUNTER — Other Ambulatory Visit: Payer: Self-pay | Admitting: Internal Medicine

## 2017-07-02 ENCOUNTER — Other Ambulatory Visit: Payer: Self-pay | Admitting: Internal Medicine

## 2017-07-03 ENCOUNTER — Other Ambulatory Visit: Payer: Self-pay | Admitting: Internal Medicine

## 2017-07-03 ENCOUNTER — Other Ambulatory Visit: Payer: Self-pay | Admitting: *Deleted

## 2017-07-03 ENCOUNTER — Telehealth: Payer: Self-pay | Admitting: Cardiovascular Disease

## 2017-07-03 MED ORDER — ATORVASTATIN CALCIUM 80 MG PO TABS: 80.0000 mg | ORAL_TABLET | Freq: Every day | ORAL | 2 refills | Status: DC

## 2017-07-03 NOTE — Telephone Encounter (Signed)
New Message    Pt is completely out  *STAT* If patient is at the pharmacy, call can be transferred to refill team.   1. Which medications need to be refilled? (please list name of each medication and dose if known)   atorvastatin (LIPITOR) 80 MG tablet Take 1 tablet (80 mg total) by mouth daily at 6 PM.     2. Which pharmacy/location (including street and city if local pharmacy) is medication to be sent to? [facility] on [address]   3. Do they need a 30 day or 90 day supply?  [place]

## 2017-07-03 NOTE — Telephone Encounter (Signed)
REFILL 

## 2017-07-04 MED ORDER — ATORVASTATIN CALCIUM 80 MG PO TABS: 80.0000 mg | ORAL_TABLET | Freq: Every day | ORAL | 3 refills | Status: DC

## 2017-07-04 NOTE — Telephone Encounter (Signed)
Rx(s) sent to pharmacy electronically.  

## 2017-07-21 ENCOUNTER — Other Ambulatory Visit: Payer: Self-pay | Admitting: Internal Medicine

## 2017-07-23 ENCOUNTER — Other Ambulatory Visit: Payer: Self-pay

## 2017-07-23 MED ORDER — TICAGRELOR 90 MG PO TABS: 90.0000 mg | ORAL_TABLET | Freq: Two times a day (BID) | ORAL | 2 refills | Status: DC

## 2017-07-23 NOTE — Telephone Encounter (Signed)
Rx(s) sent to pharmacy electronically.  

## 2017-07-25 ENCOUNTER — Other Ambulatory Visit: Payer: Self-pay | Admitting: Family Medicine

## 2017-07-25 DIAGNOSIS — F32A Depression, unspecified: Secondary | ICD-10-CM

## 2017-07-25 DIAGNOSIS — F329 Major depressive disorder, single episode, unspecified: Secondary | ICD-10-CM

## 2017-07-27 NOTE — Telephone Encounter (Signed)
Call --- patient is overdue for six month follow-up with DR. GREENE.  We have approved a one month supply of fluoxetine only.  Please schedule with GREENE in upcoming month for six month follow-up on depression, cholesterol/labs.

## 2017-08-01 ENCOUNTER — Ambulatory Visit (INDEPENDENT_AMBULATORY_CARE_PROVIDER_SITE_OTHER): Payer: Medicare Other | Admitting: Emergency Medicine

## 2017-08-01 DIAGNOSIS — Z23 Encounter for immunization: Secondary | ICD-10-CM

## 2017-08-24 ENCOUNTER — Other Ambulatory Visit: Payer: Self-pay | Admitting: Family Medicine

## 2017-08-24 DIAGNOSIS — F32A Depression, unspecified: Secondary | ICD-10-CM

## 2017-08-24 DIAGNOSIS — F329 Major depressive disorder, single episode, unspecified: Secondary | ICD-10-CM

## 2017-08-30 ENCOUNTER — Ambulatory Visit (INDEPENDENT_AMBULATORY_CARE_PROVIDER_SITE_OTHER): Payer: Medicare Other | Admitting: Family Medicine

## 2017-08-30 ENCOUNTER — Encounter: Payer: Self-pay | Admitting: Family Medicine

## 2017-08-30 DIAGNOSIS — F329 Major depressive disorder, single episode, unspecified: Secondary | ICD-10-CM

## 2017-08-30 DIAGNOSIS — F32A Depression, unspecified: Secondary | ICD-10-CM

## 2017-08-30 MED ORDER — FLUOXETINE HCL 20 MG PO CAPS: 20.0000 mg | ORAL_CAPSULE | Freq: Every day | ORAL | 3 refills | Status: DC

## 2017-08-30 NOTE — Patient Instructions (Addendum)
Thank you for coming in today to discuss the fluoxetine. Since you have done well on that medication, I sent in refills for 1 year. If any change in depression symptoms, or you have any new side effects of that medication - please let me know. Otherwise we can follow up at your next medicare wellness visit.    IF you received an x-ray today, you will receive an invoice from [facility]. Please contact [facility] at [phone] with questions or concerns regarding your invoice.   IF you received labwork today, you will receive an invoice from [facility]. Please contact [facility] at [phone] with questions or concerns regarding your invoice.   Our billing staff will not be able to assist you with questions regarding bills from these companies.  You will be contacted with the lab results as soon as they are available. The fastest way to get your results is to activate your My Chart account. Instructions are located on the last page of this paperwork. If you have not heard from [place] regarding the results in 2 weeks, please contact this office.

## 2017-08-30 NOTE — Progress Notes (Signed)
Subjective:  By signing my name below, I, Timothy Escobar, attest that this documentation has been prepared under the direction and in the presence of Timothy Ray, MD. Electronically Signed: [name], [place]. 08/30/2017 , 5:53 PM .  Patient was seen in Room 11 .   Patient ID: Timothy Escobar, male    DOB: August 17, 1941, 76 y.o.   MRN: [id number] Chief Complaint  Patient presents with  . Medication Refill   HPI Timothy Escobar is a 76 y.o. male Here for medication refill.   Depression He was continued on Prozac at his annual wellness visit in February. He's been taking Prozac for 25 years. Last time he talked with his psychiatrist, he was informed to stay on it if he wants to stay on it. He hasn't had any episodes.   CAD STEMI in Oct 2017. His cardiologist is Dr. Claiborne Escobar. He's on Lipitor 80mg  QD, lisinopril 2.5mg  QD, aspirin, and Brilinta 90mg  BID. Last visit with cardiology was on June 18th. Plan for decreasing Brilinta to 60mg  BID after 1 year if stable. Plan for repeat cardiology eval in December.   Patient Active Problem List   Diagnosis Date Noted  . Sinus bradycardia 01/23/2017  . Acute bronchitis 11/21/2016  . Dyslipidemia   . CAD (coronary artery disease)   . Status post coronary artery stent placement   . Bradycardia   . Hypokalemia   . Hyperlipidemia with target LDL less than 70   . STEMI (ST elevation myocardial infarction) ([facility]) 08/22/2016  . Acute ST elevation myocardial infarction (STEMI) involving left anterior descending (LAD) coronary artery ([place]) 08/22/2016  . Hip pain 09/28/2012  . Chronic depression 09/28/2012   Past Medical History:  Diagnosis Date  . Escobar transfusion without reported diagnosis   . CAD (coronary artery disease)    a. 08/22/2016 Ant STEMI/PCI: LM nl, LAD 30ost, 100p (3.0x15 Synergy DES & 2.75x24 Synergy DES), LCX large/dominant/nl, RCA nondom, nl;  b. 08/2016 Echo: Ef 60-65%, no rwma.  . Depression   . Dyslipidemia   . Skin cancer     skin cancer   Past Surgical History:  Procedure Laterality Date  . APPENDECTOMY    . HERNIA REPAIR    . parotid gland cyst removal Right 2012  . vocal fold polyp removed     No Known Allergies Prior to Admission medications   Medication Sig Start Date End Date Taking? Authorizing Provider  acetaminophen (TYLENOL) 500 MG tablet Take 500 mg by mouth every 6 (six) hours as needed for pain or headache.    [provider]  aspirin EC 81 MG EC tablet Take 1 tablet (81 mg total) by mouth daily. 08/26/16   Maryellen Pile, MD  atorvastatin (LIPITOR) 80 MG tablet Take 1 tablet (80 mg total) by mouth daily at 6 PM. 07/04/17   Troy Sine, MD  calcium-vitamin D (OSCAL WITH D) 500-200 MG-UNIT per tablet Take 1 tablet by mouth daily. 1,000 of Vit D3    [provider]  cholecalciferol (VITAMIN D) 1000 units tablet Take 1,000 Units by mouth daily.    [provider]  clobetasol ointment (TEMOVATE) 1.02 % Apply 1 application topically 2 (two) times daily as needed (rash).  08/30/15   [provider]  FLUoxetine (PROZAC) 20 MG capsule TAKE ONE CAPSULE BY MOUTH ONCE DAILY 07/27/17   Wardell Honour, MD  Ginger Root POWD by Does not apply route. Mixes 1/2 teaspoon of powder into hot tea once daily    [provider]  lisinopril (PRINIVIL,ZESTRIL) 2.5 MG tablet Take 1 tablet (2.5 mg total) by mouth daily. 05/21/17   Troy Sine, MD  Multiple Vitamins-Minerals (MULTIVITAMIN WITH MINERALS) tablet Take 1 tablet by mouth once a week. Centrum Silver     [provider]  nitroGLYCERIN (NITROSTAT) 0.4 MG SL tablet Place 1 tablet (0.4 mg total) under the tongue every 5 (five) minutes x 3 doses as needed for chest pain. 08/25/16   Maryellen Pile, MD  ticagrelor (BRILINTA) 90 MG TABS tablet Take 1 tablet (90 mg total) by mouth 2 (two) times daily. 07/23/17   Troy Sine, MD  vitamin C (ASCORBIC ACID) 500 MG tablet Take 500 mg by mouth daily.    [provider]   Social History   Socioeconomic History  . Marital status: Married    Spouse name: Not on file  . Number of children: 2  . Years of education: college  . Highest education level: Not on file  Social Needs  . Financial resource strain: Not on file  . Food insecurity - worry: Not on file  . Food insecurity - inability: Not on file  . Transportation needs - medical: Not on file  . Transportation needs - non-medical: Not on file  Occupational History  . Occupation: Retired - Water quality scientist  Tobacco Use  . Smoking status: Former Smoker    Last attempt to quit: 01/22/1967    Years since quitting: 50.6  . Smokeless tobacco: Never Used  Substance and Sexual Activity  . Alcohol use: Yes    Alcohol/week: 3.5 oz    Types: 7 Standard drinks or equivalent per week    Comment: wine: 1 glass daily  . Drug use: No  . Sexual activity: No  Other Topics Concern  . Not on file  Social History Narrative   Lives w/ wife in [place]. 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, plus the church choir.   Exercise walk/gardening daily for 1 hr plus   Review of Systems  Constitutional: Negative for fatigue and unexpected weight change.  Eyes: Negative for visual disturbance.  Respiratory: Negative for cough, chest tightness and shortness of breath.   Cardiovascular: Negative for chest pain, palpitations and leg swelling.  Gastrointestinal: Negative for abdominal pain and Escobar in stool.  Neurological: Negative for dizziness, light-headedness and headaches.       Objective:   Physical Exam  Constitutional: He is oriented to person, place, and time. He appears well-developed and well-nourished. No distress.  HENT:  Head: Normocephalic and atraumatic.  Eyes: EOM are normal. Pupils are equal, round, and reactive to light.  Neck: Neck supple.  Cardiovascular: Normal rate.  Pulmonary/Chest: Effort normal. No respiratory distress.    Musculoskeletal: Normal range of motion.  Neurological: He is alert and oriented to person, place, and time.  Skin: Skin is warm and dry.  Psychiatric: He has a normal mood and affect. His behavior is normal.  Nursing note and vitals reviewed.   Vitals:   08/30/17 1702  BP: (!) 100/52  Pulse: 66  Resp: 16  Temp: 97.9 F (36.6 C)  TempSrc: Oral  SpO2: 96%  Weight: 169 lb (76.7 kg)  Height: 5\' 8"  (1.727 m)      Assessment & Plan:   Timothy Escobar is a 76 y.o. male Depression - Plan: FLUoxetine (PROZAC) 20 MG capsule Stable. Controlled on Prozac, and has been on this dose for some time.  Has discussed trial off meds with psychiatry in the past but has decided to remain on that dosing. Potential side effects and risks were discussed  -continue same dose of Prozac and one year of refills given duration of treatment and stability.  Meds ordered this encounter  Medications  . FLUoxetine (PROZAC) 20 MG capsule    Sig: Take 1 capsule (20 mg total) daily by mouth.    Dispense:  90 capsule    Refill:  3   Patient Instructions   Thank you for coming in today to discuss the fluoxetine. Since you have done well on that medication, I sent in refills for 1 year. If any change in depression symptoms, or you have any new side effects of that medication - please let me know. Otherwise we can follow up at your next medicare wellness visit.    IF you received an x-Escobar today, you will receive an invoice from [facility]. Please contact [facility] at [phone] with questions or concerns regarding your invoice.   IF you received labwork today, you will receive an invoice from [facility]. Please contact [facility] at [phone] with questions or concerns regarding your invoice.   Our billing staff will not be able to assist you with questions regarding bills from these companies.  You will be contacted with the lab results as soon as they are available. The fastest way to  get your results is to activate your My Chart account. Instructions are located on the last page of this paperwork. If you have not heard from [place] regarding the results in 2 weeks, please contact this office.      I personally performed the services described in this documentation, which was scribed in my presence. The recorded information has been reviewed and considered for accuracy and completeness, addended by me as needed, and agree with information above.  Signed,   Timothy Ray, MD [facility].  08/30/17 6:06 PM

## 2017-10-03 DIAGNOSIS — I2510 Atherosclerotic heart disease of native coronary artery without angina pectoris: Secondary | ICD-10-CM | POA: Diagnosis not present

## 2017-10-04 LAB — COMPREHENSIVE METABOLIC PANEL
A/G RATIO: 2.1 (ref 1.2–2.2)
ALT: 22 IU/L (ref 0–44)
AST: 23 IU/L (ref 0–40)
Albumin: 4.2 g/dL (ref 3.5–4.8)
Alkaline Phosphatase: 75 IU/L (ref 39–117)
BILIRUBIN TOTAL: 0.8 mg/dL (ref 0.0–1.2)
BUN/Creatinine Ratio: 18 (ref 10–24)
BUN: 16 mg/dL (ref 8–27)
CALCIUM: 8.9 mg/dL (ref 8.6–10.2)
CO2: 27 mmol/L (ref 20–29)
Chloride: 103 mmol/L (ref 96–106)
Creatinine, Ser: 0.88 mg/dL (ref 0.76–1.27)
GFR calc non Af Amer: 85 mL/min/{1.73_m2} (ref >59)
GFR, EST AFRICAN AMERICAN: 98 mL/min/{1.73_m2} (ref >59)
GLUCOSE: 91 mg/dL (ref 65–99)
Globulin, Total: 2 g/dL (ref 1.5–4.5)
Potassium: 4.4 mmol/L (ref 3.5–5.2)
Sodium: 143 mmol/L (ref 134–144)
TOTAL PROTEIN: 6.2 g/dL (ref 6.0–8.5)

## 2017-10-04 LAB — CBC
HEMATOCRIT: 40.6 % (ref 37.5–51.0)
HEMOGLOBIN: 14.1 g/dL (ref 13.0–17.7)
MCH: 33 pg (ref 26.6–33.0)
MCHC: 34.7 g/dL (ref 31.5–35.7)
MCV: 95 fL (ref 79–97)
Platelets: 228 10*3/uL (ref 150–379)
RBC: 4.27 x10E6/uL (ref 4.14–5.80)
RDW: 14 % (ref 12.3–15.4)
WBC: 3.5 10*3/uL (ref 3.4–10.8)

## 2017-10-04 LAB — LIPID PANEL
CHOL/HDL RATIO: 2 ratio (ref 0.0–5.0)
CHOLESTEROL TOTAL: 119 mg/dL (ref 100–199)
HDL: 60 mg/dL (ref >39)
LDL Calculated: 49 mg/dL (ref 0–99)
TRIGLYCERIDES: 51 mg/dL (ref 0–149)
VLDL CHOLESTEROL CAL: 10 mg/dL (ref 5–40)

## 2017-10-04 LAB — TSH: TSH: 1.05 u[IU]/mL (ref 0.450–4.500)

## 2017-10-08 ENCOUNTER — Encounter: Payer: Self-pay | Admitting: Cardiovascular Disease

## 2017-10-08 ENCOUNTER — Ambulatory Visit: Payer: Medicare Other | Admitting: Cardiovascular Disease

## 2017-10-08 VITALS — BP 114/64 | HR 61 | Ht 68.0 in | Wt 168.0 lb

## 2017-10-08 DIAGNOSIS — I10 Essential (primary) hypertension: Secondary | ICD-10-CM | POA: Diagnosis not present

## 2017-10-08 DIAGNOSIS — Z955 Presence of coronary angioplasty implant and graft: Secondary | ICD-10-CM | POA: Diagnosis not present

## 2017-10-08 DIAGNOSIS — I25118 Atherosclerotic heart disease of native coronary artery with other forms of angina pectoris: Secondary | ICD-10-CM | POA: Diagnosis not present

## 2017-10-08 DIAGNOSIS — I2109 ST elevation (STEMI) myocardial infarction involving other coronary artery of anterior wall: Secondary | ICD-10-CM

## 2017-10-08 DIAGNOSIS — R011 Cardiac murmur, unspecified: Secondary | ICD-10-CM

## 2017-10-08 DIAGNOSIS — E785 Hyperlipidemia, unspecified: Secondary | ICD-10-CM

## 2017-10-08 MED ORDER — TICAGRELOR 60 MG PO TABS: 60.0000 mg | ORAL_TABLET | Freq: Two times a day (BID) | ORAL | 3 refills | Status: DC

## 2017-10-08 NOTE — Patient Instructions (Signed)
Medication Instructions:  When you run out of Brilinta 90 mg --DECREASE to Brilinta 60 mg two times daily  Testing/Procedures: Your physician has requested that you have an echocardiogram in 6 MONTHS. Echocardiography is a painless test that uses sound waves to create images of your heart. It provides your doctor with information about the size and shape of your heart and how well your heart's chambers and valves are working. This procedure takes approximately one hour. There are no restrictions for this procedure. This will be done at our [facility] location:  [place]: Your physician wants you to follow-up in: 6 months with Dr. Claiborne Billings (after echo) You will receive a reminder letter in the mail two months in advance. If you don't receive a letter, please call our office to schedule the follow-up appointment.   Any Other Special Instructions Will Be Listed Below (If Applicable).     If you need a refill on your cardiac medications before your next appointment, please call your pharmacy.

## 2017-10-08 NOTE — Progress Notes (Signed)
Cardiology Office Note    Date:  10/08/2017   ID:  Deaveon, Schoen 05-04-1941, MRN [id number]  PCP:  Wendie Agreste, MD  Cardiologist:  Shelva Majestic, MD   Initial F/U post Anterior MI  History of Present Illness:  GEVORG BRUM is a 76 y.o. male who sufferred an Anterior STEMI on 08/22/16.  I last saw him in June 2018.  He presents for six-month follow-up evaluation.   Mr. Mckenna Boruff is a 76 year old Caucasian male who was without cardiac history and developed acute onset of severe substernal chest pressure while he was moving logs in a wheelbarrow which commenced at approximately 5 PM on 08/22/16. EMS arrived within 15 minutes. His ECG in transient showed 11 mm of ST segment elevation in leads V3 through V6 consistent with an acute anterior injury current. A code STEMI was activated and he was brought emergently directly to the catheterization laboratory where I performed emergent catheterization. Catheterization revealed total occlusion of a large proximal LAD with TIMI 0 flow and a large dominant normal left circumflex vessel and small nondominant RCA. He underwent successful PCI to the LAD, treated with PTCA/DES stenting at the site of 100% occlusion with initial insertion of a 3.016 mm Synergy DES stent.  Once  TIMI-3 flow was established it was evident that there was segmental disease beyond the stented segment of 70 and 80% and tandem stenting with a 2.7524 mm Synergy DES stent was done with post stent dilatation from 3.23 mm in the proximal portion of the proximal stent to 3.05 mm at the distal portion of the distal stent. LVEDP was 13 mm Hg and the door to balloon time from arrival was 19 minutes. DTB from chest pain onset prior to EMS arrival was < 1 hour.  He ultimately completely resolved his 11 mm ST elevation, ECG normalized, and subsequent echo showed completely normal LV fx with EF 60-65% without any wall motion abnormality! He was discharged 2 days later.  Since  his event he has remained asymptomatic.  He completed participation in cardiac rehabilitation.  He is participating in cardiac rehab.  When I last saw him, he was remaining completely asymptomatic.  Over the past 6 months.  He continues to be asymptomatic.  He is active.  He splits wood.  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  He will be performing [employer] tomorrow evening for a long program. He is energetic.  He denies palpitations.  There is no PND, orthopnea.  He denies bleeding.  He underwent laboratory last week which showed a total cholesterol 119, HDL 60, LDL 49, triglycerides 51.  Hemoglobin A1c was 5.5.  Creatine 0.88.  TSH 1.05.  Normal LFTs.  He presents for reevaluation.  Past Medical History:  Diagnosis Date  . Blood transfusion without reported diagnosis   . CAD (coronary artery disease)    a. 08/22/2016 Ant STEMI/PCI: LM nl, LAD 30ost, 100p (3.0x15 Synergy DES & 2.75x24 Synergy DES), LCX large/dominant/nl, RCA nondom, nl;  b. 08/2016 Echo: Ef 60-65%, no rwma.  . Depression   . Dyslipidemia   . Skin cancer    skin cancer    Past Surgical History:  Procedure Laterality Date  . APPENDECTOMY    . CARDIAC CATHETERIZATION N/A 08/22/2016   Procedure: Left Heart Cath and Coronary Angiography;  Surgeon: Troy Sine, MD;  Location: [place] [facility];  Service: Cardiovascular;  Laterality: N/A;  . CARDIAC CATHETERIZATION N/A 08/22/2016  Procedure: Coronary Stent Intervention;  Surgeon: Troy Sine, MD;  Location: [place] [facility];  Service: Cardiovascular;  Laterality: N/A;  MID LAD  . HERNIA REPAIR    . parotid gland cyst removal Right 2012  . vocal fold polyp removed      Current Medications: Outpatient Medications Prior to Visit  Medication Sig Dispense Refill  . acetaminophen (TYLENOL) 500 MG tablet Take 500 mg by mouth every 6 (six) hours as needed for pain or headache.    Marland Kitchen aspirin EC 81 MG EC tablet Take 1 tablet (81 mg total)  by mouth daily. 90 tablet 2  . atorvastatin (LIPITOR) 80 MG tablet Take 1 tablet (80 mg total) by mouth daily at 6 PM. 90 tablet 3  . calcium-vitamin D (OSCAL WITH D) 500-200 MG-UNIT per tablet Take 1 tablet by mouth daily. 1,000 of Vit D3    . cholecalciferol (VITAMIN D) 1000 units tablet Take 1,000 Units by mouth daily.    . clobetasol ointment (TEMOVATE) 3.50 % Apply 1 application topically 2 (two) times daily as needed (rash).     Marland Kitchen FLUoxetine (PROZAC) 20 MG capsule Take 1 capsule (20 mg total) daily by mouth. 90 capsule 3  . Ginger Root POWD by Does not apply route. Mixes 1/2 teaspoon of powder into hot tea once daily    . lisinopril (PRINIVIL,ZESTRIL) 2.5 MG tablet Take 1 tablet (2.5 mg total) by mouth daily. 90 tablet 2  . Multiple Vitamins-Minerals (MULTIVITAMIN WITH MINERALS) tablet Take 1 tablet by mouth once a week. Centrum Silver     . nitroGLYCERIN (NITROSTAT) 0.4 MG SL tablet Place 1 tablet (0.4 mg total) under the tongue every 5 (five) minutes x 3 doses as needed for chest pain. 30 tablet 12  . vitamin C (ASCORBIC ACID) 500 MG tablet Take 500 mg by mouth daily.    . ticagrelor (BRILINTA) 90 MG TABS tablet Take 1 tablet (90 mg total) by mouth 2 (two) times daily. 180 tablet 2   No facility-administered medications prior to visit.      Allergies:   Patient has no known allergies.   Social History   Socioeconomic History  . Marital status: Married    Spouse name: None  . Number of children: 2  . Years of education: college  . Highest education level: None  Social Needs  . Financial resource strain: None  . Food insecurity - worry: None  . Food insecurity - inability: None  . Transportation needs - medical: None  . Transportation needs - non-medical: None  Occupational History  . Occupation: Retired - Water quality scientist  Tobacco Use  . Smoking status: Former Smoker    Last attempt to quit: 01/22/1967    Years since quitting: 50.7  . Smokeless tobacco: Never Used    Substance and Sexual Activity  . Alcohol use: Yes    Alcohol/week: 3.5 oz    Types: 7 Standard drinks or equivalent per week    Comment: wine: 1 glass daily  . Drug use: No  . Sexual activity: No  Other Topics Concern  . None  Social History Narrative   Lives w/ wife in [place]. 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, plus the church choir.   Exercise walk/gardening daily for 1 hr plus     Family History:  The patient's family history includes Cancer in his brother, brother, father, paternal grandmother, sister, and sister; Dementia in his mother; Heart disease  in his brother, maternal grandmother, and sister; Hypertension in his sister; Stroke in his maternal grandmother.   ROS General: Negative; No fevers, chills, or night sweats;  HEENT: Negative; No changes in vision or hearing, sinus congestion, difficulty swallowing Pulmonary: Negative; No cough, wheezing, shortness of breath, hemoptysis Cardiovascular: Negative; No chest pain, presyncope, syncope, palpitations GI: Negative; No nausea, vomiting, diarrhea, or abdominal pain GU: Negative; No dysuria, hematuria, or difficulty voiding Musculoskeletal: Negative; no myalgias, joint pain, or weakness Hematologic/Oncology: Negative; no easy bruising, bleeding Endocrine: Negative; no heat/cold intolerance; no diabetes Neuro: Negative; no changes in balance, headaches Skin: Negative; No rashes or skin lesions Psychiatric: Negative; No behavioral problems, depression Sleep: Negative; No snoring, daytime sleepiness, hypersomnolence, bruxism, restless legs, hypnogognic hallucinations, no cataplexy Other comprehensive 14 point system review is negative.   PHYSICAL EXAM:   VS:  BP 114/64   Pulse 61   Ht _0  (1.727 m)   Wt 168 lb (76.2 kg)   BMI 25.54 kg/m    Repeat blood pressure by me 130/66  Wt Readings from Last 3 Encounters:  10/08/17 168 lb (76.2 kg)  08/30/17 169 lb  (76.7 kg)  04/09/17 165 lb (74.8 kg)      Physical Exam BP 114/64   Pulse 61   Ht _1  (1.727 m)   Wt 168 lb (76.2 kg)   BMI 25.54 kg/m  General: Alert, oriented, no distress.  Skin: normal turgor, no rashes, warm and dry HEENT: Normocephalic, atraumatic. Pupils equal round and reactive to light; sclera anicteric; extraocular muscles intact;  Nose without nasal septal hypertrophy Mouth/Parynx benign; Mallinpatti scale 3 Neck: No JVD, no carotid bruits; normal carotid upstroke Lungs: clear to ausculatation and percussion; no wheezing or rales Chest wall: without tenderness to palpitation Heart: PMI not displaced, RRR, s1 s2 normal, 1/6 systolic murmur along the left sternal border and apex, no diastolic murmur, no rubs, gallops, thrills, or heaves Abdomen: soft, nontender; no hepatosplenomehaly, BS+; abdominal aorta nontender and not dilated by palpation. Back: no CVA tenderness Pulses 2+ Musculoskeletal: full range of motion, normal strength, no joint deformities Extremities: no clubbing cyanosis or edema, Homan's sign negative  Neurologic: grossly nonfocal; Cranial nerves grossly wnl Psychologic: Normal mood and affect    Studies/Labs Reviewed:   EKG:  EKG is ordered today. ECG (independently read by me): Normal sinus rhythm at 61 bpm.  No ST segment changes.  No ectopy.  Normal intervals.  June 2018 ECG (independently read by me): Sinus bradycardia 51 bpm.  No ST segment changes.  Normal intervals.  February 2018 ECG (independently read by me): Sinus bradycardia 53 bpm.  Normal ECG.  Early repolarization.  No evidence for prior infarction.  Recent Labs: BMP Latest Ref Rng & Units 10/03/2017 02/15/2017 01/21/2017  Glucose 65 - 99 mg/dL 91 117(H) 139(H)  BUN 8 - 27 mg/dL _2 Creatinine 0.76 - 1.27 mg/dL 0.88 0.88 0.85  BUN/Creat Ratio 10 - 24 18 - -  Sodium 134 - 144 mmol/L 143 139 137  Potassium 3.5 - 5.2 mmol/L 4.4 3.8 4.0  Chloride 96 - 106 mmol/L 103 106 103    CO2 20 - 29 mmol/L _3 Calcium 8.6 - 10.2 mg/dL 8.9 8.8(L) 9.0     Hepatic Function Latest Ref Rng & Units 10/03/2017 02/15/2017 10/10/2016  Total Protein 6.0 - 8.5 g/dL 6.2 6.0(L) 6.2  Albumin 3.5 - 4.8 g/dL 4.2 4.0 4.1  AST 0 - 40 IU/L _4 ALT  0 - 44 IU/L _0 Alk Phosphatase 39 - 117 IU/L 75 73 61  Total Bilirubin 0.0 - 1.2 mg/dL 0.8 0.9 0.7  Bilirubin, Direct <=0.2 mg/dL - - 0.2    CBC Latest Ref Rng & Units 10/03/2017 02/15/2017 01/21/2017  WBC 3.4 - 10.8 x10E3/uL 3.5 4.1 5.3  Hemoglobin 13.0 - 17.7 g/dL 14.1 12.9(L) 13.1  Hematocrit 37.5 - 51.0 % 40.6 38.2(L) 38.3(L)  Platelets 150 - 379 x10E3/uL 228 170 162   Lab Results  Component Value Date   MCV 95 10/03/2017   MCV 92.3 02/15/2017   MCV 91.8 01/21/2017   Lab Results  Component Value Date   TSH 1.050 10/03/2017   Lab Results  Component Value Date   HGBA1C 5.5 08/23/2016     BNP    Component Value Date/Time   BNP 164.0 (H) 08/23/2016 0200    ProBNP No results found for: PROBNP   Lipid Panel     Component Value Date/Time   CHOL 119 10/03/2017 1030   TRIG 51 10/03/2017 1030   HDL 60 10/03/2017 1030   CHOLHDL 2.0 10/03/2017 1030   CHOLHDL 2.3 10/10/2016 0951   VLDL 10 10/10/2016 0951   LDLCALC 49 10/03/2017 1030     RADIOLOGY: No results found.   ASSESSMENT:    1. Coronary artery disease involving native coronary artery of native heart with other form of angina pectoris ([place])   2. ST elevation myocardial infarction (STEMI) of anterior wall, subsequent episode of care (HCC):08/22/2016: Status post PCI to LAD with tandem DES stenting and documentation of complete myocardial salva   3. Status post coronary artery stent placement   4. Hyperlipidemia with target LDL less than 70   5. Systolic murmur   6. Essential hypertension      PLAN:  Mr Morici Is a 76 year old Caucasian male who develeoped an ACS/Anterior STEMI with 11 mm ST elevation precordially on Halloween 2017.  Fortunately he acted quickly and was prompty brought to [facility] cath lab where I performed emergent cath/PCI .  He had an DTB time of 19 minutes from arrival to the lab and his chest pain duration was less than 60 minutes. Due to disease beyond the initial occlusion he underwent tandem DES stenting with an excellent result.  He has been demonstrated to have complete myocardial salvage and his ECG is normal. .  He has not had any recurrent episodes of chest pain or palpitations.  He remains active and walks at least 2 miles per day, does gardening, and often splits wood.  He recently banged his shin wall attempting wood splitting which led to contusion which has healed.  His blood pressure today is stable on his low-dose lisinopril at 2.5 mg.  He is on atorvastatin 80 mg.  Most recent lipid studies are excellent with an LDL at 49.  He has continued to be on aspirin and full strength, Brilinta 90 mg twice a day.  I discussed with him the recent [facility] trial data.  He is now 15 months following his acute coronary syndrome.  Once his current dose of Brilinta 90 mg runs out.  I will switch him to 60/g twice a day regimen.  He does have a systolic murmur suggestive of possible mitral regurgitation.  I will see him in 6 months for reevaluation, and prior to that office visit he will undergo an 18 month follow-up echo Doppler evaluation.  I will see him in 6 months for reevaluation or sooner if problems  arise   Medication Adjustments/Labs and Tests Ordered: Current medicines are reviewed at length with the patient today.  Concerns regarding medicines are outlined above.  Medication changes, Labs and Tests ordered today are listed in the Patient Instructions below.  Patient Instructions  Medication Instructions:  When you run out of Brilinta 90 mg --DECREASE to Brilinta 60 mg two times daily  Testing/Procedures: Your physician has requested that you have an echocardiogram in 6 MONTHS. Echocardiography is a painless  test that uses sound waves to create images of your heart. It provides your doctor with information about the size and shape of your heart and how well your heart's chambers and valves are working. This procedure takes approximately one hour. There are no restrictions for this procedure. This will be done at our [facility] location:  [place]: Your physician wants you to follow-up in: 6 months with Dr. Claiborne Billings (after echo) You will receive a reminder letter in the mail two months in advance. If you don't receive a letter, please call our office to schedule the follow-up appointment.   Any Other Special Instructions Will Be Listed Below (If Applicable).     If you need a refill on your cardiac medications before your next appointment, please call your pharmacy.      Signed, Shelva Majestic, MD , [facility] 10/08/2017 2:13 PM    [facility] [address] [place], [place], [place]  [address] Phone: [phone]

## 2017-10-12 ENCOUNTER — Other Ambulatory Visit (HOSPITAL_COMMUNITY): Payer: Medicare Other

## 2017-10-26 ENCOUNTER — Telehealth: Payer: Self-pay | Admitting: Cardiovascular Disease

## 2017-10-26 MED ORDER — TICAGRELOR 60 MG PO TABS: 60.0000 mg | ORAL_TABLET | Freq: Two times a day (BID) | ORAL | 12 refills | Status: DC

## 2017-10-26 NOTE — Telephone Encounter (Signed)
Refill sent to the pharmacy electronically.  

## 2017-10-26 NOTE — Telephone Encounter (Signed)
°*  STAT* If patient is at the pharmacy, call can be transferred to refill team.   1. Which medications need to be refilled? (please list name of each medication and dose if known)Brilinta  60 mg ( Needs a new prescription sent for the lower dosage)   2. Which pharmacy/location (including street and city if local pharmacy) is medication to be sent to?[facility] on [address]   3. Do they need a 30 day or 90 day supply? [place]

## 2017-12-18 ENCOUNTER — Ambulatory Visit (INDEPENDENT_AMBULATORY_CARE_PROVIDER_SITE_OTHER): Payer: Medicare Other | Admitting: Family Medicine

## 2017-12-18 ENCOUNTER — Encounter: Payer: Self-pay | Admitting: Family Medicine

## 2017-12-18 ENCOUNTER — Other Ambulatory Visit: Payer: Self-pay

## 2017-12-18 VITALS — BP 118/68 | HR 60 | Temp 97.9°F | Resp 18 | Ht 68.0 in | Wt 167.4 lb

## 2017-12-18 DIAGNOSIS — Z23 Encounter for immunization: Secondary | ICD-10-CM

## 2017-12-18 DIAGNOSIS — Z0000 Encounter for general adult medical examination without abnormal findings: Secondary | ICD-10-CM

## 2017-12-18 MED ORDER — ZOSTER VAC RECOMB ADJUVANTED 50 MCG/0.5ML IM SUSR: 0.5000 mL | Freq: Once | INTRAMUSCULAR | 1 refills | Status: AC

## 2017-12-18 NOTE — Patient Instructions (Addendum)
You are likely due for repeat colonoscopy in August. Let me know if you do not receive a call to schedule that procedure.  Shingles vaccine sent to your pharmacy.   Tylenol if needed for occasional aches and pains. If one area is more sore or swollen,  return to discuss further.    Please bring copy of living will to scan into chart.       IF you received an x-ray today, you will receive an invoice from [facility]. Please contact [facility] at [phone] with questions or concerns regarding your invoice.   IF you received labwork today, you will receive an invoice from [place]. Please contact [facility] at [phone] with questions or concerns regarding your invoice.   Our billing staff will not be able to assist you with questions regarding bills from these companies.  You will be contacted with the lab results as soon as they are available. The fastest way to get your results is to activate your My Chart account. Instructions are located on the last page of this paperwork. If you have not heard from [place] regarding the results in 2 weeks, please contact this office.       Preventive Care 75 Years and Older, Male Preventive care refers to lifestyle choices and visits with your health care provider that can promote health and wellness. What does preventive care include?  A yearly physical exam. This is also called an annual well check.  Dental exams once or twice a year.  Routine eye exams. Ask your health care provider how often you should have your eyes checked.  Personal lifestyle choices, including: ? Daily care of your teeth and gums. ? Regular physical activity. ? Eating a healthy diet. ? Avoiding tobacco and drug use. ? Limiting alcohol use. ? Practicing safe sex. ? Taking low doses of aspirin every day. ? Taking vitamin and mineral supplements as recommended by your health care provider. What happens during an annual well check? The services  and screenings done by your health care provider during your annual well check will depend on your age, overall health, lifestyle risk factors, and family history of disease. Counseling Your health care provider may ask you questions about your:  Alcohol use.  Tobacco use.  Drug use.  Emotional well-being.  Home and relationship well-being.  Sexual activity.  Eating habits.  History of falls.  Memory and ability to understand (cognition).  Work and work Statistician.  Screening You may have the following tests or measurements:  Height, weight, and BMI.  Blood pressure.  Lipid and cholesterol levels. These may be checked every 5 years, or more frequently if you are over 56 years old.  Skin check.  Lung cancer screening. You may have this screening every year starting at age 56 if you have a 30-pack-year history of smoking and currently smoke or have quit within the past 15 years.  Fecal occult blood test (FOBT) of the stool. You may have this test every year starting at age 25.  Flexible sigmoidoscopy or colonoscopy. You may have a sigmoidoscopy every 5 years or a colonoscopy every 10 years starting at age 35.  Prostate cancer screening. Recommendations will vary depending on your family history and other risks.  Hepatitis C blood test.  Hepatitis B blood test.  Sexually transmitted disease (STD) testing.  Diabetes screening. This is done by checking your blood sugar (glucose) after you have not eaten for a while (fasting). You may have this done every 1-3 years.  Abdominal aortic aneurysm (AAA) screening. You may need this if you are a current or former smoker.  Osteoporosis. You may be screened starting at age 31 if you are at high risk.  Talk with your health care provider about your test results, treatment options, and if necessary, the need for more tests. Vaccines Your health care provider may recommend certain vaccines, such as:  Influenza vaccine. This  is recommended every year.  Tetanus, diphtheria, and acellular pertussis (Tdap, Td) vaccine. You may need a Td booster every 10 years.  Varicella vaccine. You may need this if you have not been vaccinated.  Zoster vaccine. You may need this after age 14.  Measles, mumps, and rubella (MMR) vaccine. You may need at least one dose of MMR if you were born in 1957 or later. You may also need a second dose.  Pneumococcal 13-valent conjugate (PCV13) vaccine. One dose is recommended after age 5.  Pneumococcal polysaccharide (PPSV23) vaccine. One dose is recommended after age 85.  Meningococcal vaccine. You may need this if you have certain conditions.  Hepatitis A vaccine. You may need this if you have certain conditions or if you travel or work in places where you may be exposed to hepatitis A.  Hepatitis B vaccine. You may need this if you have certain conditions or if you travel or work in places where you may be exposed to hepatitis B.  Haemophilus influenzae type b (Hib) vaccine. You may need this if you have certain risk factors.  Talk to your health care provider about which screenings and vaccines you need and how often you need them. This information is not intended to replace advice given to you by your health care provider. Make sure you discuss any questions you have with your health care provider. Document Released: 11/05/2015 Document Revised: 06/28/2016 Document Reviewed: 08/10/2015 Elsevier Interactive Patient Education  [employer].

## 2017-12-18 NOTE — Progress Notes (Addendum)
Subjective:    Patient ID: Timothy Escobar, male    DOB: 06-14-1941, 77 y.o.   MRN: [id number]  HPI Timothy Escobar is a 77 y.o. male Presents today for: Chief Complaint  Patient presents with  . Annual Exam  Here for annual exam/wellness visit.   History of depression, treated with Prozac. Doing well when discussed November 2018. No change in dose at that time. Had been on medication for over 20 years and when treated by psychiatrists in past recommended to remain on medication. No new side effects, sx's controlled.   History of CAD with STEMI in October 2017, cardiologist is Dr. Claiborne Billings. He takes Lipitor 80 mg daily, lisinopril 2.5 mg daily, aspirin, Brilinta. Decreased to 60 mg twice a day at December office visit. Bruising less. Planning on 6 month echo.  Lab Results  Component Value Date   CHOL 119 10/03/2017   HDL 60 10/03/2017   LDLCALC 49 10/03/2017   TRIG 51 10/03/2017   CHOLHDL 2.0 10/03/2017   Cancer screening: Colonoscopy 2009, hyperplastic polyp with recommendation to repeat in 10 years.  Prostate cancer screening:  PSA 0.8 in February 2018 - deferred further testing today.  Lab Results  Component Value Date   PSA 1.06 10/07/2014   PSA 0.78 09/26/2012   Immunizations: Immunization History  Administered Date(s) Administered  . Influenza,inj,Quad PF,6+ Mos 07/18/2013, 07/29/2014, 08/30/2015, 07/20/2016, 08/01/2017  . Influenza-Unspecified 07/23/2012  . Pneumococcal Polysaccharide-23 05/13/2008  . Td 05/13/2008  . Tdap 09/26/2012  . Zoster 10/23/2009  Shingrix: Due for Prevnar, but he has declined this previously. Agrees to this today.   Fall screen: No falls in past year.   Depression screen: Depression screen [facility] 2/9 12/18/2017 08/30/2017 01/15/2017 12/14/2016 11/21/2016  Decreased Interest 0 0 0 0 0  Down, Depressed, Hopeless 0 0 0 0 0  PHQ - 2 Score 0 0 0 0 0   Functional status: Functional Status Survey: Is the patient deaf or have difficulty  hearing?: No Does the patient have difficulty seeing, even when wearing glasses/contacts?: No Does the patient have difficulty concentrating, remembering, or making decisions?: No Does the patient have difficulty walking or climbing stairs?: No Does the patient have difficulty dressing or bathing?: No Does the patient have difficulty doing errands alone such as visiting a doctor's office or shopping?: No  Mental status screen: 6CIT Screen 12/18/2017  What Year? 0 points  What month? 0 points  What time? 0 points  Count back from 20 0 points  Months in reverse 0 points  Repeat phrase 0 points  Total Score 0     Vision:  Visual Acuity Screening   Right eye Left eye Both eyes  Without correction:     With correction: '20/20 20/20 20/20 '$  Hearing Screening Comments: Pt could hear whispered colors for whisper test.   Patient Active Problem List   Diagnosis Date Noted  . Sinus bradycardia 01/23/2017  . Acute bronchitis 11/21/2016  . Dyslipidemia   . CAD (coronary artery disease)   . Status post coronary artery stent placement   . Bradycardia   . Hypokalemia   . Hyperlipidemia with target LDL less than 70   . STEMI (ST elevation myocardial infarction) ([place]) 08/22/2016  . Acute ST elevation myocardial infarction (STEMI) involving left anterior descending (LAD) coronary artery ([place]) 08/22/2016  . Hip pain 09/28/2012  . Chronic depression 09/28/2012   Past Medical History:  Diagnosis Date  . Blood transfusion without reported diagnosis   . CAD (coronary  artery disease)    a. 08/22/2016 Ant STEMI/PCI: LM nl, LAD 30ost, 100p (3.0x15 Synergy DES & 2.75x24 Synergy DES), LCX large/dominant/nl, RCA nondom, nl;  b. 08/2016 Echo: Ef 60-65%, no rwma.  . Depression   . Dyslipidemia   . Skin cancer    skin cancer   Past Surgical History:  Procedure Laterality Date  . APPENDECTOMY    . CARDIAC CATHETERIZATION N/A 08/22/2016   Procedure: Left Heart Cath and Coronary Angiography;   Surgeon: Troy Sine, MD;  Location: [facility];  Service: Cardiovascular;  Laterality: N/A;  . CARDIAC CATHETERIZATION N/A 08/22/2016   Procedure: Coronary Stent Intervention;  Surgeon: Troy Sine, MD;  Location: [facility];  Service: Cardiovascular;  Laterality: N/A;  MID LAD  . HERNIA REPAIR    . parotid gland cyst removal Right 2012  . vocal fold polyp removed     No Known Allergies Prior to Admission medications   Medication Sig Start Date End Date Taking? Authorizing Provider  acetaminophen (TYLENOL) 500 MG tablet Take 500 mg by mouth every 6 (six) hours as needed for pain or headache.   Yes [provider]  aspirin EC 81 MG EC tablet Take 1 tablet (81 mg total) by mouth daily. 08/26/16  Yes Maryellen Pile, MD  atorvastatin (LIPITOR) 80 MG tablet Take 1 tablet (80 mg total) by mouth daily at 6 PM. 07/04/17  Yes Troy Sine, MD  calcium-vitamin D (OSCAL WITH D) 500-200 MG-UNIT per tablet Take 1 tablet by mouth daily. 1,000 of Vit D3   Yes [provider]  cholecalciferol (VITAMIN D) 1000 units tablet Take 1,000 Units by mouth daily.   Yes [provider]  clobetasol ointment (TEMOVATE) 6.30 % Apply 1 application topically 2 (two) times daily as needed (rash).  08/30/15  Yes [provider]  FLUoxetine (PROZAC) 20 MG capsule Take 1 capsule (20 mg total) daily by mouth. 08/30/17  Yes Wendie Agreste, MD  Ginger Root POWD by Does not apply route. Mixes 1/2 teaspoon of powder into hot tea once daily   Yes [provider]  lisinopril (PRINIVIL,ZESTRIL) 2.5 MG tablet Take 1 tablet (2.5 mg total) by mouth daily. 05/21/17  Yes Troy Sine, MD  Multiple Vitamins-Minerals (MULTIVITAMIN WITH MINERALS) tablet Take 1 tablet by mouth once a week. Centrum Silver    Yes [provider]  nitroGLYCERIN (NITROSTAT) 0.4 MG SL tablet Place 1 tablet (0.4 mg total) under the tongue every 5 (five) minutes x 3 doses as needed for  chest pain. 08/25/16  Yes Maryellen Pile, MD  ticagrelor (BRILINTA) 60 MG TABS tablet Take 1 tablet (60 mg total) by mouth 2 (two) times daily. 10/26/17  Yes Troy Sine, MD  vitamin C (ASCORBIC ACID) 500 MG tablet Take 500 mg by mouth daily.   Yes [provider]  Zoster Vaccine Adjuvanted [facility]) injection Inject 0.5 mLs into the muscle once for 1 dose. Repeat in 2-6 months. 12/18/17 12/18/17  Wendie Agreste, MD   Social History   Socioeconomic History  . Marital status: Married    Spouse name: Not on file  . Number of children: 2  . Years of education: college  . Highest education level: Not on file  Social Needs  . Financial resource strain: Not on file  . Food insecurity - worry: Not on file  . Food insecurity - inability: Not on file  . Transportation needs - medical: Not on file  . Transportation  needs - non-medical: Not on file  Occupational History  . Occupation: Retired - Water quality scientist  Tobacco Use  . Smoking status: Former Smoker    Last attempt to quit: 01/22/1967    Years since quitting: 50.9  . Smokeless tobacco: Never Used  Substance and Sexual Activity  . Alcohol use: Yes    Alcohol/week: 3.5 oz    Types: 7 Standard drinks or equivalent per week    Comment: wine: 1 glass daily  . Drug use: No  . Sexual activity: No  Other Topics Concern  . Not on file  Social History Narrative   Lives w/ wife in [place]. Both are singers, they sing with the [employer] in [place] and with [employer], plus the church choir.   Exercise walk/gardening daily for 1 hr plus     Review of Systems 13 point review of systems per patient health survey noted.  Joint pain, neck stiffness at times, otherwiise negative. Noticed some neck and L knee soreness with projects at home - no weakness, no mechanical symptoms.     Objective:   Physical Exam  Constitutional: He is oriented to person, place, and time. He appears well-developed and well-nourished.  HENT:   Head: Normocephalic and atraumatic.  Right Ear: External ear normal.  Left Ear: External ear normal.  Mouth/Throat: Oropharynx is clear and moist.  Eyes: Conjunctivae and EOM are normal. Pupils are equal, round, and reactive to light.  Neck: Normal range of motion. Neck supple. No thyromegaly present.  Cardiovascular: Normal rate, regular rhythm, normal heart sounds and intact distal pulses.  Pulmonary/Chest: Effort normal and breath sounds normal. No respiratory distress. He has no wheezes.  Abdominal: Soft. He exhibits no distension. There is no tenderness.  Musculoskeletal: Normal range of motion. He exhibits no edema or tenderness.  Lymphadenopathy:    He has no cervical adenopathy.  Neurological: He is alert and oriented to person, place, and time. He has normal reflexes.  Skin: Skin is warm and dry.  Psychiatric: He has a normal mood and affect. His behavior is normal.  Vitals reviewed.     Assessment & Plan:    Timothy Escobar is a 77 y.o. male Medicare annual wellness visit, subsequent  Need for shingles vaccine - Plan: Zoster Vaccine Adjuvanted [facility]) injection  Need for prophylactic vaccination against Streptococcus pneumoniae (pneumococcus) - Plan: Pneumococcal conjugate vaccine 13-valent IM  Annual wellness visit, no concerning findings on history or exam  - anticipatory guidance as below in AVS, screening labs if needed. Health maintenance items as above in HPI discussed/recommended as applicable.   - no concerning responses on depression, fall, or functional status screening. Any positive responses noted as above. Advanced directives discussed as in CHL.   Shingles vaccine sent to pharmacy, Prevnar given.  Tylenol if needed for episodic arthralgias, follow-up if any specific area is more sore/persistently sore, or swelling.   Meds ordered this encounter  Medications  . Zoster Vaccine Adjuvanted [facility]) injection    Sig: Inject 0.5 mLs into the muscle  once for 1 dose. Repeat in 2-6 months.    Dispense:  0.5 mL    Refill:  1   Patient Instructions    You are likely due for repeat colonoscopy in August. Let me know if you do not receive a call to schedule that procedure.  Shingles vaccine sent to your pharmacy.   Tylenol if needed for occasional aches and pains. If one area is more sore or swollen,  return  to discuss further.    Please bring copy of living will to scan into chart.       IF you received an x-ray today, you will receive an invoice from [facility]. Please contact [facility] at [phone] with questions or concerns regarding your invoice.   IF you received labwork today, you will receive an invoice from [place]. Please contact [facility] at [phone] with questions or concerns regarding your invoice.   Our billing staff will not be able to assist you with questions regarding bills from these companies.  You will be contacted with the lab results as soon as they are available. The fastest way to get your results is to activate your My Chart account. Instructions are located on the last page of this paperwork. If you have not heard from [place] regarding the results in 2 weeks, please contact this office.       Preventive Care 26 Years and Older, Male Preventive care refers to lifestyle choices and visits with your health care provider that can promote health and wellness. What does preventive care include?  A yearly physical exam. This is also called an annual well check.  Dental exams once or twice a year.  Routine eye exams. Ask your health care provider how often you should have your eyes checked.  Personal lifestyle choices, including: ? Daily care of your teeth and gums. ? Regular physical activity. ? Eating a healthy diet. ? Avoiding tobacco and drug use. ? Limiting alcohol use. ? Practicing safe sex. ? Taking low doses of aspirin every day. ? Taking vitamin and mineral  supplements as recommended by your health care provider. What happens during an annual well check? The services and screenings done by your health care provider during your annual well check will depend on your age, overall health, lifestyle risk factors, and family history of disease. Counseling Your health care provider may ask you questions about your:  Alcohol use.  Tobacco use.  Drug use.  Emotional well-being.  Home and relationship well-being.  Sexual activity.  Eating habits.  History of falls.  Memory and ability to understand (cognition).  Work and work Statistician.  Screening You may have the following tests or measurements:  Height, weight, and BMI.  Blood pressure.  Lipid and cholesterol levels. These may be checked every 5 years, or more frequently if you are over 7 years old.  Skin check.  Lung cancer screening. You may have this screening every year starting at age 36 if you have a 30-pack-year history of smoking and currently smoke or have quit within the past 15 years.  Fecal occult blood test (FOBT) of the stool. You may have this test every year starting at age 38.  Flexible sigmoidoscopy or colonoscopy. You may have a sigmoidoscopy every 5 years or a colonoscopy every 10 years starting at age 61.  Prostate cancer screening. Recommendations will vary depending on your family history and other risks.  Hepatitis C blood test.  Hepatitis B blood test.  Sexually transmitted disease (STD) testing.  Diabetes screening. This is done by checking your blood sugar (glucose) after you have not eaten for a while (fasting). You may have this done every 1-3 years.  Abdominal aortic aneurysm (AAA) screening. You may need this if you are a current or former smoker.  Osteoporosis. You may be screened starting at age 41 if you are at high risk.  Talk with your health care provider about your test results, treatment options, and if necessary,  the need for  more tests. Vaccines Your health care provider may recommend certain vaccines, such as:  Influenza vaccine. This is recommended every year.  Tetanus, diphtheria, and acellular pertussis (Tdap, Td) vaccine. You may need a Td booster every 10 years.  Varicella vaccine. You may need this if you have not been vaccinated.  Zoster vaccine. You may need this after age 21.  Measles, mumps, and rubella (MMR) vaccine. You may need at least one dose of MMR if you were born in 1957 or later. You may also need a second dose.  Pneumococcal 13-valent conjugate (PCV13) vaccine. One dose is recommended after age 29.  Pneumococcal polysaccharide (PPSV23) vaccine. One dose is recommended after age 1.  Meningococcal vaccine. You may need this if you have certain conditions.  Hepatitis A vaccine. You may need this if you have certain conditions or if you travel or work in places where you may be exposed to hepatitis A.  Hepatitis B vaccine. You may need this if you have certain conditions or if you travel or work in places where you may be exposed to hepatitis B.  Haemophilus influenzae type b (Hib) vaccine. You may need this if you have certain risk factors.  Talk to your health care provider about which screenings and vaccines you need and how often you need them. This information is not intended to replace advice given to you by your health care provider. Make sure you discuss any questions you have with your health care provider. Document Released: 11/05/2015 Document Revised: 06/28/2016 Document Reviewed: 08/10/2015 Elsevier Interactive Patient Education  2018 [place],   Merri Ray, MD [facility] [place].  12/18/17 9:28 AM

## 2018-01-22 ENCOUNTER — Ambulatory Visit: Payer: Self-pay | Admitting: *Deleted

## 2018-01-22 NOTE — Telephone Encounter (Signed)
Pt stating he has been experiencing crusting in bilateral eyes with yellow discharge mixed in with blood. Pt states that the bilateral sclera is red as well. Pt denies any pain or itching and just states that the his eyes feel irritated.  Pt has complaints of head cold for the past 2 days. PCP not available for appt on tomorrow so appt scheduled with Chelle Jeffrey,PA at 11:20. Pt advised to seek treatment in the ED/ Urgent Care if symptoms worsened. Pt verbalized understanding.   Reason for Disposition . [1] Eye with yellow/green discharge or eyelashes stick together AND [2] NO PCP standing order to call in antibiotic eye drops  Answer Assessment - Initial Assessment Questions 1. EYE DISCHARGE: "Is the discharge in one or both eyes?" "What color is it?" "How much is there?" "When did the discharge start?"      Blood tinged discharge to both eyes today starting late this morning 2. REDNESS OF SCLERA: "Is the redness in one or both eyes?" "When did the redness start?"      Both eyes 3. EYELIDS: "Are the eyelids red or swollen?" If so, ask: "How much?"      Below eye is puffy 4. VISION: "Is there any difficulty seeing clearly?"      Pt states it is heard to see due to the drainage 5. PAIN: "Is there any pain? If so, ask: "How bad is it?" (Scale 1-10; or mild, moderate, severe)    - MILD (1-3): doesn't interfere with normal activities     - MODERATE (4-7): interferes with normal activities or awakens from sleep    - SEVERE (8-10): excruciating pain, unable to do any normal activities       Denies any pain 6. CONTACT LENS: "Do you wear contacts?"     No 7. OTHER SYMPTOMS: "Do you have any other symptoms?" (e.g., fever, runny nose, cough)     Pt has head cold, runny and congested nose  Protocols used: EYE - PUS OR DISCHARGE-A-AH

## 2018-01-23 ENCOUNTER — Other Ambulatory Visit: Payer: Self-pay

## 2018-01-23 ENCOUNTER — Ambulatory Visit (INDEPENDENT_AMBULATORY_CARE_PROVIDER_SITE_OTHER): Payer: Medicare Other | Admitting: Physician Assistant

## 2018-01-23 ENCOUNTER — Encounter: Payer: Self-pay | Admitting: Physician Assistant

## 2018-01-23 DIAGNOSIS — H109 Unspecified conjunctivitis: Secondary | ICD-10-CM | POA: Diagnosis not present

## 2018-01-23 DIAGNOSIS — B9689 Other specified bacterial agents as the cause of diseases classified elsewhere: Secondary | ICD-10-CM | POA: Insufficient documentation

## 2018-01-23 MED ORDER — CIPROFLOXACIN HCL 0.3 % OP SOLN: 1.0000 [drp] | OPHTHALMIC | 0 refills | Status: AC

## 2018-01-23 NOTE — Progress Notes (Signed)
Subjective:    Patient ID: Timothy Escobar, male    DOB: 09/23/1941, 77 y.o.   MRN: [id number]   Chief Complaint  Patient presents with  . Eye Drainage    bilateral discharge started yesterday     HPI  77 yo pt presents for eye drainage b/l that started yesterday. Had a sore throat on Saturday, started having cold symptoms Monday (runny nose, cough). Woke up Tuesday morning with a sore throat, lost his voice and itchy eyes. Reports mucus and a lot of fluid draining from his eyes. Stringy white/yellow mucus that is blood tinged. Had worsened by Tuesday night and woke up in the middle of the night with his eyes stuck shut.   Reports trying to wash eyes out with saline and water with no relief.   Denies fever, ear pain, sore throat today.   Review of Systems  Constitutional: Negative for chills and fever.  HENT: Positive for congestion, postnasal drip, rhinorrhea and voice change. Negative for ear discharge, ear pain, facial swelling, mouth sores, nosebleeds, sinus pressure, sinus pain, sore throat, tinnitus and trouble swallowing.   Eyes: Positive for discharge, redness, itching and visual disturbance.  Respiratory: Positive for cough. Negative for choking, shortness of breath and wheezing.   Cardiovascular: Negative for chest pain and palpitations.  Gastrointestinal: Negative for abdominal distention, abdominal pain, constipation, diarrhea, nausea and vomiting.  Endocrine: Negative.   Genitourinary: Negative.   Musculoskeletal: Negative.  Negative for arthralgias, back pain, myalgias, neck pain and neck stiffness.  Skin: Negative.   Allergic/Immunologic: Negative.   Neurological: Negative.  Negative for dizziness, weakness, light-headedness and headaches.  Hematological: Negative.   Psychiatric/Behavioral: Negative.    No Known Allergies Prior to Admission medications   Medication Sig Start Date End Date Taking? Authorizing Provider  acetaminophen (TYLENOL) 500 MG tablet Take  500 mg by mouth every 6 (six) hours as needed for pain or headache.   Yes [provider]  aspirin EC 81 MG EC tablet Take 1 tablet (81 mg total) by mouth daily. 08/26/16  Yes Maryellen Pile, MD  atorvastatin (LIPITOR) 80 MG tablet Take 1 tablet (80 mg total) by mouth daily at 6 PM. 07/04/17  Yes Troy Sine, MD  calcium-vitamin D (OSCAL WITH D) 500-200 MG-UNIT per tablet Take 1 tablet by mouth daily. 1,000 of Vit D3   Yes [provider]  cholecalciferol (VITAMIN D) 1000 units tablet Take 1,000 Units by mouth daily.   Yes [provider]  clobetasol ointment (TEMOVATE) 4.09 % Apply 1 application topically 2 (two) times daily as needed (rash).  08/30/15  Yes [provider]  FLUoxetine (PROZAC) 20 MG capsule Take 1 capsule (20 mg total) daily by mouth. 08/30/17  Yes Wendie Agreste, MD  Ginger Root POWD by Does not apply route. Mixes 1/2 teaspoon of powder into hot tea once daily   Yes [provider]  lisinopril (PRINIVIL,ZESTRIL) 2.5 MG tablet Take 1 tablet (2.5 mg total) by mouth daily. 05/21/17  Yes Troy Sine, MD  Multiple Vitamins-Minerals (MULTIVITAMIN WITH MINERALS) tablet Take 1 tablet by mouth once a week. Centrum Silver    Yes [provider]  nitroGLYCERIN (NITROSTAT) 0.4 MG SL tablet Place 1 tablet (0.4 mg total) under the tongue every 5 (five) minutes x 3 doses as needed for chest pain. 08/25/16  Yes Maryellen Pile, MD  ticagrelor (BRILINTA) 60 MG TABS tablet Take 1 tablet (60 mg total) by mouth 2 (two) times daily. 10/26/17  Yes  Troy Sine, MD  vitamin C (ASCORBIC ACID) 500 MG tablet Take 500 mg by mouth daily.   Yes [provider]  ciprofloxacin (CILOXAN) 0.3 % ophthalmic solution Place 1 drop into both eyes every 4 (four) hours while awake for 5 days. 01/23/18 01/28/18  Harrison Mons, PA-C   Patient Active Problem List   Diagnosis Date Noted  . Bacterial conjunctivitis of both eyes 01/23/2018  . Sinus  bradycardia 01/23/2017  . Acute bronchitis 11/21/2016  . Dyslipidemia   . CAD (coronary artery disease)   . Status post coronary artery stent placement   . Bradycardia   . Hypokalemia   . Hyperlipidemia with target LDL less than 70   . STEMI (ST elevation myocardial infarction) ([place]) 08/22/2016  . Acute ST elevation myocardial infarction (STEMI) involving left anterior descending (LAD) coronary artery ([place]) 08/22/2016  . Hip pain 09/28/2012  . Chronic depression 09/28/2012   Past Medical History:  Diagnosis Date  . Blood transfusion without reported diagnosis   . CAD (coronary artery disease)    a. 08/22/2016 Ant STEMI/PCI: LM nl, LAD 30ost, 100p (3.0x15 Synergy DES & 2.75x24 Synergy DES), LCX large/dominant/nl, RCA nondom, nl;  b. 08/2016 Echo: Ef 60-65%, no rwma.  . Depression   . Dyslipidemia   . Skin cancer    skin cancer       Objective:   Physical Exam  Constitutional: He is oriented to person, place, and time. Vital signs are normal. He appears well-developed and well-nourished. He is active. No distress.  HENT:  Head: Normocephalic and atraumatic.  Eyes: Pupils are equal, round, and reactive to light. EOM are normal. Right eye exhibits discharge (white stringy). No foreign body present in the right eye. Left eye exhibits discharge (white stringy). No foreign body present in the left eye. Right conjunctiva is injected. Right conjunctiva has no hemorrhage. Left conjunctiva is injected. Left conjunctiva has no hemorrhage. No scleral icterus. Right eye exhibits normal extraocular motion. Left eye exhibits normal extraocular motion.  Neck: Normal range of motion. Neck supple. No tracheal deviation present. No thyromegaly present.  Cardiovascular: Normal rate, regular rhythm and intact distal pulses. Exam reveals no gallop and no friction rub.  No murmur heard. Pulmonary/Chest: Effort normal and breath sounds normal. No respiratory distress. He has no wheezes. He has no rales. He  exhibits no tenderness.  Abdominal: Soft. Bowel sounds are normal. He exhibits no distension. There is no tenderness.  Musculoskeletal: Normal range of motion.  Neurological: He is alert and oriented to person, place, and time. He has normal reflexes. No cranial nerve deficit.  Skin: Skin is warm and dry. No rash noted. He is not diaphoretic. No erythema. No pallor.  Psychiatric: He has a normal mood and affect.          Assessment & Plan:   1. Bacterial conjunctivitis of both eyes  Pt informed he has bacterial conjunctivitis. Informed to use ciprofloxacin (CILOXAN) 0.3 % ophthalmic solution and to Place 1 drop into both eyes every 4 (four) hours while awake for 5 days.  Dispense: 5 mL.  Patient was educated and given information about bacterial conjunctivitis and informed to return for f/u if he does not improve within 24-48 hrs.

## 2018-01-23 NOTE — Progress Notes (Signed)
Patient ID: Timothy Escobar, male    DOB: December 31, 1940, 77 y.o.   MRN: [id number]  PCP: Wendie Agreste, MD  Chief Complaint  Patient presents with  . Eye Drainage    bilateral discharge started yesterday     Subjective:   Presents for evaluation of itchy eyes with drainage that began yesterday.  4 days ago, he awoke with a sore throat and over the next several days developed runny nose and cough. Yesterday, laryngitis and itchy eyes, draining stringy white/yellow blood tinged mucous.  The URI-type symptoms are improving, but the eye symptoms persist. Last night he awoke several times with the lids crusted shut. No pain. No FB-type sensation.  Visual disturbance only in that there is mucous he has to blink away.  Sore throat has resolved. Cough is mild. Runny nose nearly resolved and voice is getting back to normal.  Describes a burning sensation of the skin around the eyes.   Review of Systems Constitutional: Negative for chills and fever.  HENT: Positive for congestion, postnasal drip, rhinorrhea and voice change. Negative for ear discharge, ear pain, facial swelling, mouth sores, nosebleeds, sinus pressure, sinus pain, sore throat, tinnitus and trouble swallowing.   Eyes: Positive for discharge, redness, itching and visual disturbance.  Respiratory: Positive for cough. Negative for choking, shortness of breath and wheezing.   Cardiovascular: Negative for chest pain and palpitations.  Gastrointestinal: Negative for abdominal distention, abdominal pain, constipation, diarrhea, nausea and vomiting.  Endocrine: Negative.   Genitourinary: Negative.   Musculoskeletal: Negative.  Negative for arthralgias, back pain, myalgias, neck pain and neck stiffness.  Skin: Negative.   Allergic/Immunologic: Negative.   Neurological: Negative.  Negative for dizziness, weakness, light-headedness and headaches.  Hematological: Negative.   Psychiatric/Behavioral: Negative.         Patient Active Problem List   Diagnosis Date Noted  . Sinus bradycardia 01/23/2017  . Acute bronchitis 11/21/2016  . Dyslipidemia   . CAD (coronary artery disease)   . Status post coronary artery stent placement   . Bradycardia   . Hypokalemia   . Hyperlipidemia with target LDL less than 70   . STEMI (ST elevation myocardial infarction) ([place]) 08/22/2016  . Acute ST elevation myocardial infarction (STEMI) involving left anterior descending (LAD) coronary artery ([place]) 08/22/2016  . Hip pain 09/28/2012  . Chronic depression 09/28/2012     Prior to Admission medications   Medication Sig Start Date End Date Taking? Authorizing Provider  acetaminophen (TYLENOL) 500 MG tablet Take 500 mg by mouth every 6 (six) hours as needed for pain or headache.   Yes [provider]  aspirin EC 81 MG EC tablet Take 1 tablet (81 mg total) by mouth daily. 08/26/16  Yes Maryellen Pile, MD  atorvastatin (LIPITOR) 80 MG tablet Take 1 tablet (80 mg total) by mouth daily at 6 PM. 07/04/17  Yes Troy Sine, MD  calcium-vitamin D (OSCAL WITH D) 500-200 MG-UNIT per tablet Take 1 tablet by mouth daily. 1,000 of Vit D3   Yes [provider]  cholecalciferol (VITAMIN D) 1000 units tablet Take 1,000 Units by mouth daily.   Yes [provider]  clobetasol ointment (TEMOVATE) 6.83 % Apply 1 application topically 2 (two) times daily as needed (rash).  08/30/15  Yes [provider]  FLUoxetine (PROZAC) 20 MG capsule Take 1 capsule (20 mg total) daily by mouth. 08/30/17  Yes Wendie Agreste, MD  Ginger Root POWD by Does not apply route. Mixes 1/2 teaspoon  of powder into hot tea once daily   Yes [provider]  lisinopril (PRINIVIL,ZESTRIL) 2.5 MG tablet Take 1 tablet (2.5 mg total) by mouth daily. 05/21/17  Yes Troy Sine, MD  Multiple Vitamins-Minerals (MULTIVITAMIN WITH MINERALS) tablet Take 1 tablet by mouth once a week. Centrum Silver    Yes [provider]  nitroGLYCERIN (NITROSTAT) 0.4 MG SL tablet Place 1 tablet (0.4 mg total) under the tongue every 5 (five) minutes x 3 doses as needed for chest pain. 08/25/16  Yes Maryellen Pile, MD  ticagrelor (BRILINTA) 60 MG TABS tablet Take 1 tablet (60 mg total) by mouth 2 (two) times daily. 10/26/17  Yes Troy Sine, MD  vitamin C (ASCORBIC ACID) 500 MG tablet Take 500 mg by mouth daily.   Yes [provider]     No Known Allergies     Objective:  Physical Exam  Constitutional: He is oriented to person, place, and time. He appears well-developed and well-nourished. He is active and cooperative. No distress.  BP 126/74   Pulse 66   Temp 97.7 F (36.5 C)   Resp 16   Ht 5\' 8"  (1.727 m)   Wt 169 lb 6.4 oz (76.8 kg)   SpO2 96%   BMI 25.76 kg/m   HENT:  Head: Normocephalic and atraumatic.  Right Ear: Hearing, tympanic membrane, external ear and ear canal normal.  Left Ear: Hearing, tympanic membrane, external ear and ear canal normal.  Nose: Rhinorrhea (mild) present. No mucosal edema, nose lacerations, sinus tenderness, nasal deformity, septal deviation or nasal septal hematoma. No epistaxis.  No foreign bodies.  Mouth/Throat: Uvula is midline, oropharynx is clear and moist and mucous membranes are normal.  Eyes: Pupils are equal, round, and reactive to light. EOM are normal. Right eye exhibits discharge. Left eye exhibits discharge. Right conjunctiva is injected. Right conjunctiva has no hemorrhage. Left conjunctiva is injected. Left conjunctiva has no hemorrhage. No scleral icterus.  Fundoscopic exam:      The right eye shows no hemorrhage and no papilledema. The right eye shows red reflex.       The left eye shows no hemorrhage and no papilledema. The left eye shows red reflex.  Lids with mild swelling and mild erythema bilaterally. Drainage is white  Neck: Normal range of motion and full passive range of motion without pain. Neck supple. No thyromegaly present.   Cardiovascular: Normal rate, regular rhythm and normal heart sounds.  Pulses:      Radial pulses are 2+ on the right side, and 2+ on the left side.  Pulmonary/Chest: Effort normal and breath sounds normal.  Lymphadenopathy:       Head (right side): No tonsillar, no preauricular, no posterior auricular and no occipital adenopathy present.       Head (left side): No tonsillar, no preauricular, no posterior auricular and no occipital adenopathy present.    He has no cervical adenopathy.       Right: No supraclavicular adenopathy present.       Left: No supraclavicular adenopathy present.  Neurological: He is alert and oriented to person, place, and time. No sensory deficit.  Skin: Skin is warm, dry and intact. No rash noted. No cyanosis or erythema. Nails show no clubbing.  Psychiatric: He has a normal mood and affect. His speech is normal and behavior is normal.        Visual Acuity Screening   Right eye Left eye Both eyes  Without correction:  With correction: 20/30 20/40 20/40        Assessment & Plan:   1. Bacterial conjunctivitis of both eyes Suspect this is viral conjunctivitis, but some concern for bacterial conjunctivitis. Start Ciloxan drops. To eye specialist if symptoms worsen or persist. - ciprofloxacin (CILOXAN) 0.3 % ophthalmic solution; Place 1 drop into both eyes every 4 (four) hours while awake for 5 days.  Dispense: 5 mL; Refill: 0    Return if symptoms worsen or fail to improve in the next 24-48 hours.   Fara Chute, PA-C [facility]

## 2018-01-23 NOTE — Patient Instructions (Addendum)
   IF you received an x-ray today, you will receive an invoice from [facility]. Please contact [facility] at [phone] with questions or concerns regarding your invoice.   IF you received labwork today, you will receive an invoice from [facility]. Please contact [facility] at [phone] with questions or concerns regarding your invoice.   Our billing staff will not be able to assist you with questions regarding bills from these companies.  You will be contacted with the lab results as soon as they are available. The fastest way to get your results is to activate your My Chart account. Instructions are located on the last page of this paperwork. If you have not heard from us regarding the results in 2 weeks, please contact this office.      Bacterial Conjunctivitis Bacterial conjunctivitis is an infection of the clear membrane that covers the white part of your eye and the inner surface of your eyelid (conjunctiva). When the blood vessels in your conjunctiva become inflamed, your eye becomes red or pink, and it will probably feel itchy. Bacterial conjunctivitis spreads very easily from person to person (is contagious). It also spreads easily from one eye to the other eye. What are the causes? This condition is caused by several common bacteria. You may get the infection if you come into close contact with another person who is infected. You may also come into contact with items that are contaminated with the bacteria, such as a face towel, contact lens solution, or eye makeup. What increases the risk? This condition is more likely to develop in people who:  Are exposed to other people who have the infection.  Wear contact lenses.  Have a sinus infection.  Have had a recent eye injury or surgery.  Have a weak body defense system (immune system).  Have a medical condition that causes dry eyes. What are the signs or symptoms? Symptoms of this condition  include:  Eye redness.  Tearing or watery eyes.  Itchy eyes.  Burning feeling in your eyes.  Thick, yellowish discharge from an eye. This may turn into a crust on the eyelid overnight and cause your eyelids to stick together.  Swollen eyelids.  Blurred vision. How is this diagnosed? Your health care provider can diagnose this condition based on your symptoms and medical history. Your health care provider may also take a sample of discharge from your eye to find the cause of your infection. This is rarely done. How is this treated? Treatment for this condition includes:  Antibiotic eye drops or ointment to clear the infection more quickly and prevent the spread of infection to others.  Oral antibiotic medicines to treat infections that do not respond to drops or ointments, or last longer than 10 days.  Cool, wet cloths (cool compresses) placed on the eyes.  Artificial tears applied 2-6 times a day. Follow these instructions at home: Medicines  Take or apply your antibiotic medicine as told by your health care provider. Do not stop taking or applying the antibiotic even if you start to feel better.  Take or apply over-the-counter and prescription medicines only as told by your health care provider.  Be very careful to avoid touching the edge of your eyelid with the eye drop bottle or the ointment tube when you apply medicines to the affected eye. This will keep you from spreading the infection to your other eye or to other people. Managing discomfort  Gently wipe away any drainage from your eye with a   warm, wet washcloth or a cotton ball.  Apply a cool, clean washcloth to your eye for 10-20 minutes, 3-4 times a day. General instructions  Do not wear contact lenses until the inflammation is gone and your health care provider says it is safe to wear them again. Ask your health care provider how to sterilize or replace your contact lenses before you use them again. Wear glasses  until you can resume wearing contacts.  Avoid wearing eye makeup until the inflammation is gone. Throw away any old eye cosmetics that may be contaminated.  Change or wash your pillowcase every day.  Do not share towels or washcloths. This may spread the infection.  Wash your hands often with soap and water. Use paper towels to dry your hands.  Avoid touching or rubbing your eyes.  Do not drive or use heavy machinery if your vision is blurred. Contact a health care provider if:  You have a fever.  Your symptoms do not get better after 10 days. Get help right away if:  You have a fever and your symptoms suddenly get worse.  You have severe pain when you move your eye.  You have facial pain, redness, or swelling.  You have sudden loss of vision. This information is not intended to replace advice given to you by your health care provider. Make sure you discuss any questions you have with your health care provider. Document Released: 10/09/2005 Document Revised: 02/17/2016 Document Reviewed: 07/22/2015 Elsevier Interactive Patient Education  2017 [employer]  

## 2018-01-24 ENCOUNTER — Ambulatory Visit: Payer: Self-pay

## 2018-01-24 ENCOUNTER — Telehealth: Payer: Self-pay

## 2018-01-24 DIAGNOSIS — H10013 Acute follicular conjunctivitis, bilateral: Secondary | ICD-10-CM | POA: Diagnosis not present

## 2018-01-24 DIAGNOSIS — H04123 Dry eye syndrome of bilateral lacrimal glands: Secondary | ICD-10-CM | POA: Diagnosis not present

## 2018-01-24 NOTE — Telephone Encounter (Signed)
Chelle,     Patient was advised on his first phone call to stop medication.  Spoke with patient and scheduled him today with [facility] at 3:00 today.

## 2018-01-24 NOTE — Telephone Encounter (Signed)
I don't know where this message came from pt stated that he went to appointment today with [facility]. [facility] stated that its viral and will go away over time. Pt was grateful for referral. Pt stated he never called. Sorry!

## 2018-01-24 NOTE — Telephone Encounter (Signed)
Copied from [place] [phone]. Topic: Referral - Request >> Jan 24, 2018 12:14 PM Wynetta Emery, [place] C wrote: Reason for CRM: pt called back in to follow up. Per [employer] message she would like for pt to see eye care. Advised pt of message response back. Pt says that he would need a referral. Pt would like to have assistance with ref to [facility] per provider.    Please assist.

## 2018-01-24 NOTE — Telephone Encounter (Signed)
1. STOP the eye drops. 2. Use natural tears (or something like that) every 1-2 hours in each eye. 3. Warm compresses. 4. Schedule with his eye specialist. If he doesn't have one, let's see if we can get him in at [facility] today or tomorrow.

## 2018-01-24 NOTE — Telephone Encounter (Signed)
Pt. Seen yesterday and prescribed Cipro eye drops. States he used them yesterday without difficulty, but this morning after using them, both eyes immediately became more red,itchy and are burning. Instructed pt. To wash face and eyes with plain water. Pt. Feels like he was having a reaction to eye drops.Please advise pt. May reach him on his mobile phone today.

## 2018-01-24 NOTE — Telephone Encounter (Signed)
Patient was to see [facility] at 3 pm. What happened?

## 2018-01-29 DIAGNOSIS — B9689 Other specified bacterial agents as the cause of diseases classified elsewhere: Secondary | ICD-10-CM | POA: Diagnosis not present

## 2018-01-29 DIAGNOSIS — J019 Acute sinusitis, unspecified: Secondary | ICD-10-CM | POA: Diagnosis not present

## 2018-01-30 ENCOUNTER — Ambulatory Visit: Payer: Medicare Other | Admitting: Physician Assistant

## 2018-01-30 ENCOUNTER — Ambulatory Visit: Payer: Self-pay

## 2018-01-30 ENCOUNTER — Encounter: Payer: Self-pay | Admitting: Physician Assistant

## 2018-01-30 ENCOUNTER — Other Ambulatory Visit: Payer: Self-pay

## 2018-01-30 VITALS — BP 132/76 | HR 68 | Temp 98.2°F | Resp 16 | Ht 66.85 in | Wt 165.0 lb

## 2018-01-30 DIAGNOSIS — R0981 Nasal congestion: Secondary | ICD-10-CM

## 2018-01-30 MED ORDER — FLUTICASONE FUROATE 27.5 MCG/SPRAY NA SUSP: 2.0000 | Freq: Every day | NASAL | 12 refills | Status: DC

## 2018-01-30 NOTE — Patient Instructions (Addendum)
Try some oxymetazoline also afrin at night.     IF you received an x-ray today, you will receive an invoice from [facility]. Please contact [facility] at [phone] with questions or concerns regarding your invoice.   IF you received labwork today, you will receive an invoice from [facility]. Please contact [facility] at [phone] with questions or concerns regarding your invoice.   Our billing staff will not be able to assist you with questions regarding bills from these companies.  You will be contacted with the lab results as soon as they are available. The fastest way to get your results is to activate your My Chart account. Instructions are located on the last page of this paperwork. If you have not heard from [place] regarding the results in 2 weeks, please contact this office.

## 2018-01-30 NOTE — Telephone Encounter (Signed)
He called in c/o sinus pain and pressure.   He was just seen at the urgent care center yesterday and started on an antibiotic and given a steroid shot.   I let him know it takes about 24-48 hours or so for the medications to start giving him relief.   He has an appt at [facility] with Philis Fendt, PA-C today at 4:40.  He denies any changes in his condition since yesterday.  I reassured him and encouraged him to keep his appt at 4:40 today.   He verbalized understanding.     Reason for Disposition . [1] Taking antibiotic AND [2] nose still blocked  Answer Assessment - Initial Assessment Questions 1. ANTIBIOTIC: "What antibiotic are you receiving?" "How many times per day?"     I was seen at the urgent care yesterday.  I got a steroid shot and an antibiotic which I'm taking.   "I'm such  Such a wimp when it comes to these kinds of things".  "My wife told me the same thing you just told me;  It hasn't been long enough for the antibiotic to work yet".  2. ONSET: "When was the antibiotic started?"     Yesterday.   I let him know he hasn't been on the antibiotic long enough.  He needed to give it 24-48 hours or so before he is going to see improvement in his symptoms. 3. PAIN: "How bad is the sinus pain?"   (Scale 1-10; mild, moderate or severe)   - MILD (1-3): doesn't interfere with normal activities    - MODERATE (4-7): interferes with normal activities (e.g., work or school) or awakens from sleep   - SEVERE (8-10): excruciating pain and patient unable to do any normal activities        C/O pain in his sinuses. 4. FEVER: "Do you have a fever?" If so, ask: "What is it, how was it measured, and when did it start?"      Not Asked.   He has an appt with Philis Fendt today at 4:40PM.  So I encouraged him to keep that appt even though he was just seen at the urgent care yesterday just to reassure him that he needs more time to start feeling improvement. 5. SYMPTOMS: "Are there any other  symptoms you're concerned about?" If so, ask: "When did it start?"     Has sinus pain and pressure. 6. PREGNANCY: "Is there any chance you are pregnant?" "When was your last menstrual period?"     N/A  Protocols used: SINUS INFECTION ON ANTIBIOTIC FOLLOW-UP CALL-A-AH

## 2018-01-30 NOTE — Progress Notes (Signed)
01/30/2018 5:55 PM   DOB: 05-05-1941 / MRN: [id number]  SUBJECTIVE:  Timothy Escobar is a 77 y.o. male presenting for left sided nasal pain and congestion.  Was diagnosed with adenovirus last week and was sent to opthalmology who advised a trial of watchful waiting. Tells me shortly thereafter he began have left sided sinus pain and pressure with some radiation to the teeth. Went to [facility] and was given IM steroids and Amox.  This helped but he became worse again today.    He has No Known Allergies.   He  has a past medical history of Blood transfusion without reported diagnosis, CAD (coronary artery disease), Depression, Dyslipidemia, and Skin cancer.    He  reports that he quit smoking about 51 years ago. He has never used smokeless tobacco. He reports that he drinks about 3.5 oz of alcohol per week. He reports that he does not use drugs. He  reports that he does not engage in sexual activity. The patient  has a past surgical history that includes Appendectomy; Hernia repair; vocal fold polyp removed; Cardiac catheterization (N/A, 08/22/2016); Cardiac catheterization (N/A, 08/22/2016); and parotid gland cyst removal (Right, 2012).  His family history includes Cancer in his brother, brother, father, paternal grandmother, sister, and sister; Dementia in his mother; Heart disease in his brother, maternal grandmother, and sister; Hypertension in his sister; Stroke in his maternal grandmother.  Review of Systems  Constitutional: Negative for chills, diaphoresis and fever.  HENT: Positive for congestion and sinus pain. Negative for ear discharge, ear pain, hearing loss, nosebleeds, sore throat and tinnitus.   Respiratory: Negative for stridor.   Gastrointestinal: Negative for nausea.  Skin: Negative for rash.  Neurological: Negative for dizziness.    The problem list and medications were reviewed and updated by myself where necessary and exist elsewhere in the encounter.    OBJECTIVE:  BP 132/76   Pulse 68   Temp 98.2 F (36.8 C)   Resp 16   Ht 5' 6.85" (1.698 m)   Wt 165 lb (74.8 kg)   SpO2 96%   BMI 25.96 kg/m   Physical Exam  Constitutional: He appears well-developed. He is active and cooperative.  Non-toxic appearance.  HENT:  Right Ear: Hearing, tympanic membrane, external ear and ear canal normal.  Left Ear: Hearing, tympanic membrane, external ear and ear canal normal.  Nose: Nose normal. Right sinus exhibits no maxillary sinus tenderness and no frontal sinus tenderness. Left sinus exhibits no maxillary sinus tenderness and no frontal sinus tenderness.  Mouth/Throat: Uvula is midline, oropharynx is clear and moist and mucous membranes are normal. No oropharyngeal exudate, posterior oropharyngeal edema or tonsillar abscesses.  Eyes: Pupils are equal, round, and reactive to light. Conjunctivae are normal.  Cardiovascular: Normal rate, regular rhythm, S1 normal, S2 normal, normal heart sounds, intact distal pulses and normal pulses. Exam reveals no gallop and no friction rub.  No murmur heard. Pulmonary/Chest: Effort normal. No stridor. No tachypnea. No respiratory distress. He has no wheezes. He has no rales.  Abdominal: He exhibits no distension.  Musculoskeletal: He exhibits no edema.  Lymphadenopathy:       Head (right side): No submandibular and no tonsillar adenopathy present.       Head (left side): No submandibular and no tonsillar adenopathy present.    He has no cervical adenopathy.  Neurological: He is alert.  Skin: Skin is warm and dry. He is not diaphoretic. No pallor.  Vitals reviewed.   No  results found for this or any previous visit (from the past 47 hour(s)).  No results found.  ASSESSMENT AND PLAN:  Joel was seen today for left sinus pain.  Diagnoses and all orders for this visit:  Nasal congestion: Advised flonase for now and oxymetazoline for acute symtpoms.  I think his symptoms will most likely resolve with  more time however.  -     fluticasone (FLONASE SENSIMIST) 27.5 MCG/SPRAY nasal spray; Place 2 sprays into the nose daily.    The patient is advised to call or return to clinic if he does not see an improvement in symptoms, or to seek the care of the closest emergency department if he worsens with the above plan.   Philis Fendt, MHS, PA-C [facility] 01/30/2018 5:55 PM

## 2018-02-07 DIAGNOSIS — H10013 Acute follicular conjunctivitis, bilateral: Secondary | ICD-10-CM | POA: Diagnosis not present

## 2018-02-07 DIAGNOSIS — H04123 Dry eye syndrome of bilateral lacrimal glands: Secondary | ICD-10-CM | POA: Diagnosis not present

## 2018-02-25 ENCOUNTER — Other Ambulatory Visit: Payer: Self-pay | Admitting: Cardiovascular Disease

## 2018-03-05 ENCOUNTER — Ambulatory Visit (HOSPITAL_COMMUNITY): Payer: Medicare Other | Attending: Cardiology

## 2018-03-05 ENCOUNTER — Other Ambulatory Visit: Payer: Self-pay

## 2018-03-05 DIAGNOSIS — I25118 Atherosclerotic heart disease of native coronary artery with other forms of angina pectoris: Secondary | ICD-10-CM | POA: Insufficient documentation

## 2018-03-05 DIAGNOSIS — E785 Hyperlipidemia, unspecified: Secondary | ICD-10-CM | POA: Insufficient documentation

## 2018-03-05 DIAGNOSIS — R011 Cardiac murmur, unspecified: Secondary | ICD-10-CM | POA: Diagnosis not present

## 2018-03-20 ENCOUNTER — Encounter: Payer: Self-pay | Admitting: Family Medicine

## 2018-04-29 ENCOUNTER — Ambulatory Visit (INDEPENDENT_AMBULATORY_CARE_PROVIDER_SITE_OTHER): Payer: Medicare Other | Admitting: Physician Assistant

## 2018-04-29 ENCOUNTER — Encounter: Payer: Self-pay | Admitting: Physician Assistant

## 2018-04-29 VITALS — BP 129/73 | HR 54 | Temp 97.7°F | Resp 16 | Ht 66.85 in | Wt 169.4 lb

## 2018-04-29 DIAGNOSIS — R21 Rash and other nonspecific skin eruption: Secondary | ICD-10-CM | POA: Diagnosis not present

## 2018-04-29 MED ORDER — MAGIC MOUTHWASH W/LIDOCAINE: 5.0000 mL | Freq: Three times a day (TID) | ORAL | 0 refills | Status: DC

## 2018-04-29 MED ORDER — VALACYCLOVIR HCL 1 G PO TABS
1000.0000 mg | ORAL_TABLET | Freq: Two times a day (BID) | ORAL | 0 refills | Status: DC
Start: 2018-04-29 — End: 2018-05-09

## 2018-04-29 NOTE — Patient Instructions (Addendum)
I will see her back next Monday to make sure that this plaque on your lower left gums clears up.  Come back in sooner if your symptoms are worsening.    IF you received an x-ray today, you will receive an invoice from [facility]. Please contact [facility] at [phone] with questions or concerns regarding your invoice.   IF you received labwork today, you will receive an invoice from [facility]. Please contact [facility] at [phone] with questions or concerns regarding your invoice.   Our billing staff will not be able to assist you with questions regarding bills from these companies.  You will be contacted with the lab results as soon as they are available. The fastest way to get your results is to activate your My Chart account. Instructions are located on the last page of this paperwork. If you have not heard from [place] regarding the results in 2 weeks, please contact this office.

## 2018-04-29 NOTE — Progress Notes (Signed)
04/29/2018 9:00 AM   DOB: [birth year] / MRN: [id number]  SUBJECTIVE:  Timothy Escobar is a 77 y.o. male presenting for mouth lesions. Symptoms present for about a week.  The problem is waxing and waning worse over the last day or 2. He has tried OTC herbs which have helped some.  Not eating normally secondary to pain.  Denies lesions on the hands and feet.  He has No Known Allergies.   He  has a past medical history of Blood transfusion without reported diagnosis, CAD (coronary artery disease), Depression, Dyslipidemia, and Skin cancer.    He  reports that he quit smoking about 51 years ago. He has never used smokeless tobacco. He reports that he drinks about 4.2 oz of alcohol per week. He reports that he does not use drugs. He  reports that he does not engage in sexual activity. The patient  has a past surgical history that includes Appendectomy; Hernia repair; vocal fold polyp removed; Cardiac catheterization (N/A, 08/22/2016); Cardiac catheterization (N/A, 08/22/2016); and parotid gland cyst removal (Right, 2012).  His family history includes Cancer in his brother, brother, father, paternal grandmother, sister, and sister; Dementia in his mother; Heart disease in his brother, maternal grandmother, and sister; Hypertension in his sister; Stroke in his maternal grandmother.  Review of Systems  Constitutional: Negative for chills, diaphoresis and fever.  Respiratory: Negative for cough.   Gastrointestinal: Negative for nausea.  Skin: Negative for rash.  Neurological: Negative for dizziness.    The problem list and medications were reviewed and updated by myself where necessary and exist elsewhere in the encounter.   OBJECTIVE:  BP 129/73   Pulse (!) 54   Temp 97.7 F (36.5 C)   Resp 16   Ht 5' 6.85" (1.698 m)   Wt 169 lb 6.4 oz (76.8 kg)   SpO2 96%   BMI 26.65 kg/m   Wt Readings from Last 3 Encounters:  04/29/18 169 lb 6.4 oz (76.8 kg)  01/30/18 165 lb (74.8 kg)  01/23/18  169 lb 6.4 oz (76.8 kg)   Temp Readings from Last 3 Encounters:  04/29/18 97.7 F (36.5 C)  01/30/18 98.2 F (36.8 C)  01/23/18 97.7 F (36.5 C)   BP Readings from Last 3 Encounters:  04/29/18 129/73  01/30/18 132/76  01/23/18 126/74   Pulse Readings from Last 3 Encounters:  04/29/18 (!) 54  01/30/18 68  01/23/18 66    Physical Exam  Constitutional: He is oriented to person, place, and time. He appears well-developed. He does not appear ill.  HENT:  Mouth/Throat:    Eyes: Pupils are equal, round, and reactive to light. Conjunctivae and EOM are normal.  Cardiovascular: Normal rate.  Pulmonary/Chest: Effort normal.  Abdominal: He exhibits no distension.  Musculoskeletal: Normal range of motion.  Neurological: He is alert and oriented to person, place, and time. No cranial nerve deficit. Coordination normal.  Skin: Skin is warm and dry. He is not diaphoretic.  Psychiatric: He has a normal mood and affect.  Nursing note and vitals reviewed.   Lab Results  Component Value Date   HGBA1C 5.5 08/23/2016    Lab Results  Component Value Date   WBC 3.5 10/03/2017   HGB 14.1 10/03/2017   HCT 40.6 10/03/2017   MCV 95 10/03/2017   PLT 228 10/03/2017    Lab Results  Component Value Date   CREATININE 0.88 10/03/2017   BUN 16 10/03/2017   NA 143 10/03/2017   K 4.4 10/03/2017  CL 103 10/03/2017   CO2 27 10/03/2017    Lab Results  Component Value Date   ALT 22 10/03/2017   AST 23 10/03/2017   ALKPHOS 75 10/03/2017   BILITOT 0.8 10/03/2017    Lab Results  Component Value Date   TSH 1.050 10/03/2017    Lab Results  Component Value Date   CHOL 119 10/03/2017   HDL 60 10/03/2017   LDLCALC 49 10/03/2017   TRIG 51 10/03/2017   CHOLHDL 2.0 10/03/2017     ASSESSMENT AND PLAN:  Vinson was seen today for mouth lesions.  Diagnoses and all orders for this visit:  Papulovesicular rash HSV versus hand-foot-and-mouth.  He has a plaque about the left lower  gums and I would like to recheck next week. Other orders -     valACYclovir (VALTREX) 1000 MG tablet; Take 1 tablet (1,000 mg total) by mouth 2 (two) times daily. -     magic mouthwash w/lidocaine SOLN; Take 5 mLs by mouth 3 (three) times daily. Swish and do not swallow.    The patient is advised to call or return to clinic if he does not see an improvement in symptoms, or to seek the care of the closest emergency department if he worsens with the above plan.   Philis Fendt, MHS, PA-C [facility] at [facility] 04/29/2018 9:00 AM

## 2018-05-06 ENCOUNTER — Ambulatory Visit (INDEPENDENT_AMBULATORY_CARE_PROVIDER_SITE_OTHER): Payer: Medicare Other | Admitting: Physician Assistant

## 2018-05-06 ENCOUNTER — Ambulatory Visit: Payer: Medicare Other | Admitting: Cardiovascular Disease

## 2018-05-06 ENCOUNTER — Encounter: Payer: Self-pay | Admitting: Cardiovascular Disease

## 2018-05-06 ENCOUNTER — Other Ambulatory Visit: Payer: Self-pay

## 2018-05-06 ENCOUNTER — Encounter: Payer: Self-pay | Admitting: Physician Assistant

## 2018-05-06 VITALS — BP 128/67 | HR 65 | Temp 98.2°F | Resp 18 | Ht 67.32 in | Wt 169.6 lb

## 2018-05-06 VITALS — BP 128/74 | HR 59 | Ht 67.0 in | Wt 171.2 lb

## 2018-05-06 DIAGNOSIS — Z09 Encounter for follow-up examination after completed treatment for conditions other than malignant neoplasm: Secondary | ICD-10-CM

## 2018-05-06 DIAGNOSIS — R21 Rash and other nonspecific skin eruption: Secondary | ICD-10-CM | POA: Diagnosis not present

## 2018-05-06 DIAGNOSIS — I2109 ST elevation (STEMI) myocardial infarction involving other coronary artery of anterior wall: Secondary | ICD-10-CM | POA: Diagnosis not present

## 2018-05-06 DIAGNOSIS — R011 Cardiac murmur, unspecified: Secondary | ICD-10-CM | POA: Diagnosis not present

## 2018-05-06 DIAGNOSIS — I10 Essential (primary) hypertension: Secondary | ICD-10-CM

## 2018-05-06 DIAGNOSIS — E785 Hyperlipidemia, unspecified: Secondary | ICD-10-CM | POA: Diagnosis not present

## 2018-05-06 NOTE — Patient Instructions (Signed)
     IF you received an x-ray today, you will receive an invoice from [facility]. Please contact [facility] at [phone] with questions or concerns regarding your invoice.   IF you received labwork today, you will receive an invoice from [facility]. Please contact [facility] at [phone] with questions or concerns regarding your invoice.   Our billing staff will not be able to assist you with questions regarding bills from these companies.  You will be contacted with the lab results as soon as they are available. The fastest way to get your results is to activate your My Chart account. Instructions are located on the last page of this paperwork. If you have not heard from us regarding the results in 2 weeks, please contact this office.     

## 2018-05-06 NOTE — Patient Instructions (Signed)
Medication Instructions:  Your physician recommends that you continue on your current medications as directed. Please refer to the Current Medication list given to you today.  Labwork: Please return for FASTING labs in 6 months (CMET, CBC, Lipid, TSH)-lab orders will be mailed at that time.  Our in office lab hours are Monday-Friday 8:00-4:00, closed for lunch 12:45-1:45 pm.  No appointment needed.  Follow-Up: Your physician wants you to follow-up in: 6 months with Dr. Claiborne Billings.  You will receive a reminder letter in the mail two months in advance. If you don't receive a letter, please call our office to schedule the follow-up appointment.   Any Other Special Instructions Will Be Listed Below (If Applicable).     If you need a refill on your cardiac medications before your next appointment, please call your pharmacy.

## 2018-05-06 NOTE — Progress Notes (Signed)
Cardiology Office Note    Date:  05/06/2018   ID:  Timothy, Escobar 1941-10-14, MRN [id number]  PCP:  Timothy Agreste, MD  Cardiologist:  Timothy Majestic, MD    History of Present Illness:  Timothy Escobar is a 77 y.o. male who sufferred an Anterior STEMI on 08/22/16.  I last saw him in December 2018.  He presents for 7 month follow-up evaluation.   [name] is a 77 year old Caucasian male who was without cardiac history and developed acute onset of severe substernal chest pressure while he was moving logs in a wheelbarrow which commenced at approximately 5 PM on 08/22/16. EMS arrived within 15 minutes. His ECG in transient showed 11 mm of ST segment elevation in leads V3 through V6 consistent with an acute anterior injury current. A code STEMI was activated and he was brought emergently directly to the catheterization laboratory where I performed emergent catheterization. Catheterization revealed total occlusion of a large proximal LAD with TIMI 0 flow and a large dominant normal left circumflex vessel and small nondominant RCA. He underwent successful PCI to the LAD, treated with PTCA/DES stenting at the site of 100% occlusion with initial insertion of a 3.016 mm Synergy DES stent.  Once  TIMI-3 flow was established it was evident that there was segmental disease beyond the stented segment of 70 and 80% and tandem stenting with a 2.7524 mm Synergy DES stent was done with post stent dilatation from 3.23 mm in the proximal portion of the proximal stent to 3.05 mm at the distal portion of the distal stent. LVEDP was 13 mm Hg and the door to balloon time from arrival was 19 minutes. DTB from chest pain onset prior to EMS arrival was < 1 hour.  He ultimately completely resolved his 11 mm ST elevation, ECG normalized, and subsequent echo showed completely normal LV fx with EF 60-65% without any wall motion abnormality! He was discharged 2 days later.  Since his event he has remained  asymptomatic.  He completed participation in cardiac rehabilitation.  He is participating in cardiac rehab.  When I last saw him, he was remaining completely asymptomatic.  Over the past 6 months.  He continues to be asymptomatic.  He is active.  He splits wood.  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  Laboratory on October 03, 2017 showed a total cholesterol 119, HDL 60, LDL 49, triglycerides 51.  Hemoglobin A1c was 5.5.  Creatine 0.88.  TSH 1.05.  Normal LFTs.   Since I last saw him in December 2018, he continues to be completely asymptomatic.  He remains active cutting his grass, cutting firewood, and walking.  He continues to sing in the [employer] choir.  H underwent  an echo Doppler study on Mar 05, 2018.  EF was 60 to 65% and he had normal diastolic parameters.  There was trivial AR, mild MR, trivial TR, and mild PR.  Presently denies any chest pain, palpitations, PND orthopnea.  He presents for reevaluation.  Past Medical History:  Diagnosis Date  . Blood transfusion without reported diagnosis   . CAD (coronary artery disease)    a. 08/22/2016 Ant STEMI/PCI: LM nl, LAD 30ost, 100p (3.0x15 Synergy DES & 2.75x24 Synergy DES), LCX large/dominant/nl, RCA nondom, nl;  b. 08/2016 Echo: Ef 60-65%, no rwma.  . Depression   . Dyslipidemia   . Skin cancer    skin cancer    Past Surgical History:  Procedure Laterality Date  .  APPENDECTOMY    . CARDIAC CATHETERIZATION N/A 08/22/2016   Procedure: Left Heart Cath and Coronary Angiography;  Surgeon: Troy Sine, MD;  Location: [place] [facility];  Service: Cardiovascular;  Laterality: N/A;  . CARDIAC CATHETERIZATION N/A 08/22/2016   Procedure: Coronary Stent Intervention;  Surgeon: Troy Sine, MD;  Location: [place] [facility];  Service: Cardiovascular;  Laterality: N/A;  MID LAD  . HERNIA REPAIR    . parotid gland cyst removal Right 2012  . vocal fold polyp removed      Current Medications: Outpatient  Medications Prior to Visit  Medication Sig Dispense Refill  . acetaminophen (TYLENOL) 500 MG tablet Take 500 mg by mouth every 6 (six) hours as needed for pain or headache.    Marland Kitchen amoxicillin-clavulanate (AUGMENTIN) 875-125 MG tablet     . aspirin EC 81 MG EC tablet Take 1 tablet (81 mg total) by mouth daily. 90 tablet 2  . atorvastatin (LIPITOR) 80 MG tablet Take 1 tablet (80 mg total) by mouth daily at 6 PM. 90 tablet 3  . calcium-vitamin D (OSCAL WITH D) 500-200 MG-UNIT per tablet Take 1 tablet by mouth daily. 1,000 of Vit D3    . cholecalciferol (VITAMIN D) 1000 units tablet Take 1,000 Units by mouth daily.    . clobetasol ointment (TEMOVATE) 3.76 % Apply 1 application topically 2 (two) times daily as needed (rash).     Marland Kitchen FLUoxetine (PROZAC) 20 MG capsule Take 1 capsule (20 mg total) daily by mouth. 90 capsule 3  . Ginger Root POWD by Does not apply route. Mixes 1/2 teaspoon of powder into hot tea once daily    . lisinopril (PRINIVIL,ZESTRIL) 2.5 MG tablet TAKE 1 TABLET BY MOUTH ONCE DAILY 90 tablet 2  . magic mouthwash w/lidocaine SOLN Take 5 mLs by mouth 3 (three) times daily. Swish and do not swallow. 100 mL 0  . Multiple Vitamins-Minerals (MULTIVITAMIN WITH MINERALS) tablet Take 1 tablet by mouth once a week. Centrum Silver     . nitroGLYCERIN (NITROSTAT) 0.4 MG SL tablet Place 1 tablet (0.4 mg total) under the tongue every 5 (five) minutes x 3 doses as needed for chest pain. 30 tablet 12  . ticagrelor (BRILINTA) 60 MG TABS tablet Take 1 tablet (60 mg total) by mouth 2 (two) times daily. 60 tablet 12  . valACYclovir (VALTREX) 1000 MG tablet Take 1 tablet (1,000 mg total) by mouth 2 (two) times daily. 20 tablet 0  . vitamin C (ASCORBIC ACID) 500 MG tablet Take 500 mg by mouth daily.    . fluticasone (FLONASE SENSIMIST) 27.5 MCG/SPRAY nasal spray Place 2 sprays into the nose daily. (Patient not taking: Reported on 05/06/2018) 10 g 12  . guaiFENesin (MUCINEX PO) Take by mouth 2 (two) times  daily.     No facility-administered medications prior to visit.      Allergies:   Patient has no known allergies.   Social History   Socioeconomic History  . Marital status: Married    Spouse name: Not on file  . Number of children: 2  . Years of education: college  . Highest education level: Not on file  Occupational History  . Occupation: Retired - Water quality scientist  Social Needs  . Financial resource strain: Not on file  . Food insecurity:    Worry: Not on file    Inability: Not on file  . Transportation needs:    Medical: Not on file    Non-medical: Not on file  Tobacco  Use  . Smoking status: Former Smoker    Last attempt to quit: 01/22/1967    Years since quitting: 51.3  . Smokeless tobacco: Never Used  Substance and Sexual Activity  . Alcohol use: Yes    Alcohol/week: 4.2 oz    Types: 7 Standard drinks or equivalent per week    Comment: wine: 1 glass daily  . Drug use: No  . Sexual activity: Never  Lifestyle  . Physical activity:    Days per week: Not on file    Minutes per session: Not on file  . Stress: Not on file  Relationships  . Social connections:    Talks on phone: Not on file    Gets together: Not on file    Attends religious service: Not on file    Active member of club or organization: Not on file    Attends meetings of clubs or organizations: Not on file    Relationship status: Not on file  Other Topics Concern  . Not on file  Social History Narrative   Lives w/ wife in [place]. Both are singers, they sing with the [employer] in [place] and with [employer], plus the church choir.   Exercise walk/gardening daily for 1 hr plus     Family History:  The patient's family history includes Cancer in his brother, brother, father, paternal grandmother, sister, and sister; Dementia in his mother; Heart disease in his brother, maternal grandmother, and sister; Hypertension in his sister; Stroke in his maternal grandmother.   ROS General:  Negative; No fevers, chills, or night sweats;  HEENT: Negative; No changes in vision or hearing, sinus congestion, difficulty swallowing Pulmonary: Negative; No cough, wheezing, shortness of breath, hemoptysis Cardiovascular: Negative; No chest pain, presyncope, syncope, palpitations GI: Negative; No nausea, vomiting, diarrhea, or abdominal pain GU: Negative; No dysuria, hematuria, or difficulty voiding Musculoskeletal: Negative; no myalgias, joint pain, or weakness Hematologic/Oncology: Negative; no easy bruising, bleeding Endocrine: Negative; no heat/cold intolerance; no diabetes Neuro: Negative; no changes in balance, headaches Skin: Negative; No rashes or skin lesions Psychiatric: Negative; No behavioral problems, depression Sleep: Negative; No snoring, daytime sleepiness, hypersomnolence, bruxism, restless legs, hypnogognic hallucinations, no cataplexy Other comprehensive 14 point system review is negative.   PHYSICAL EXAM:   VS:  BP 128/74   Pulse (!) 59   Ht '5\' 7"'$  (1.702 m)   Wt 171 lb 3.2 oz (77.7 kg)   BMI 26.81 kg/m    Repeat blood pressure by me was 126/72  Wt Readings from Last 3 Encounters:  05/06/18 171 lb 3.2 oz (77.7 kg)  05/06/18 169 lb 9.6 oz (76.9 kg)  04/29/18 169 lb 6.4 oz (76.8 kg)     General: Alert, oriented, no distress.  Skin: normal turgor, no rashes, warm and dry HEENT: Normocephalic, atraumatic. Pupils equal round and reactive to light; sclera anicteric; extraocular muscles intact;  Nose without nasal septal hypertrophy Mouth/Parynx benign; Mallinpatti scale 3 Neck: No JVD, no carotid bruits; normal carotid upstroke Lungs: clear to ausculatation and percussion; no wheezing or rales Chest wall: without tenderness to palpitation Heart: PMI not displaced, RRR, s1 s2 normal, 1/6 systolic murmur, no diastolic murmur, no rubs, gallops, thrills, or heaves Abdomen: soft, nontender; no hepatosplenomehaly, BS+; abdominal aorta nontender and not dilated by  palpation. Back: no CVA tenderness Pulses 2+ Musculoskeletal: full range of motion, normal strength, no joint deformities Extremities: no clubbing cyanosis or edema, Homan's sign negative  Neurologic: grossly nonfocal; Cranial nerves grossly wnl Psychologic: Normal mood  and affect    Studies/Labs Reviewed:   EKG:  EKG is ordered today. ECG (independently read by me): Sinius bradycardia 59 bpm.  No ST segment changes.  No ECG evidence for prior ACS.  December 2018 ECG (independently read by me): Normal sinus rhythm at 61 bpm.  No ST segment changes.  No ectopy.  Normal intervals.  June 2018 ECG (independently read by me): Sinus bradycardia 51 bpm.  No ST segment changes.  Normal intervals.  February 2018 ECG (independently read by me): Sinus bradycardia 53 bpm.  Normal ECG.  Early repolarization.  No evidence for prior infarction.  Recent Labs: BMP Latest Ref Rng & Units 10/03/2017 02/15/2017 01/21/2017  Glucose 65 - 99 mg/dL 91 117(H) 139(H)  BUN 8 - 27 mg/dL '16 16 19  '$ Creatinine 0.76 - 1.27 mg/dL 0.88 0.88 0.85  BUN/Creat Ratio 10 - 24 18 - -  Sodium 134 - 144 mmol/L 143 139 137  Potassium 3.5 - 5.2 mmol/L 4.4 3.8 4.0  Chloride 96 - 106 mmol/L 103 106 103  CO2 20 - 29 mmol/L '27 25 24  '$ Calcium 8.6 - 10.2 mg/dL 8.9 8.8(L) 9.0     Hepatic Function Latest Ref Rng & Units 10/03/2017 02/15/2017 10/10/2016  Total Protein 6.0 - 8.5 g/dL 6.2 6.0(L) 6.2  Albumin 3.5 - 4.8 g/dL 4.2 4.0 4.1  AST 0 - 40 IU/L '23 28 22  '$ ALT 0 - 44 IU/L '22 28 23  '$ Alk Phosphatase 39 - 117 IU/L 75 73 61  Total Bilirubin 0.0 - 1.2 mg/dL 0.8 0.9 0.7  Bilirubin, Direct <=0.2 mg/dL - - 0.2    CBC Latest Ref Rng & Units 10/03/2017 02/15/2017 01/21/2017  WBC 3.4 - 10.8 x10E3/uL 3.5 4.1 5.3  Hemoglobin 13.0 - 17.7 g/dL 14.1 12.9(L) 13.1  Hematocrit 37.5 - 51.0 % 40.6 38.2(L) 38.3(L)  Platelets 150 - 379 x10E3/uL 228 170 162   Lab Results  Component Value Date   MCV 95 10/03/2017   MCV 92.3 02/15/2017   MCV  91.8 01/21/2017   Lab Results  Component Value Date   TSH 1.050 10/03/2017   Lab Results  Component Value Date   HGBA1C 5.5 08/23/2016     BNP    Component Value Date/Time   BNP 164.0 (H) 08/23/2016 0200    ProBNP No results found for: PROBNP   Lipid Panel     Component Value Date/Time   CHOL 119 10/03/2017 1030   TRIG 51 10/03/2017 1030   HDL 60 10/03/2017 1030   CHOLHDL 2.0 10/03/2017 1030   CHOLHDL 2.3 10/10/2016 0951   VLDL 10 10/10/2016 0951   LDLCALC 49 10/03/2017 1030     RADIOLOGY: No results found.   ASSESSMENT:    1. ST elevation myocardial infarction (STEMI) of anterior wall, subsequent episode of care (HCC):08/22/2016: Status post PCI to LAD with tandem DES stenting and documentation of complete myocardial salva   2. Hyperlipidemia with target LDL less than 70   3. Essential hypertension   4. Systolic murmur: mild MR      PLAN:  Mr Chittum Is a 77 year old Caucasian male who develeoped an ACS/Anterior STEMI with 11 mm ST elevation precordially on Halloween 2017. Fortunately he acted quickly and was prompty brought to [facility] cath lab where I performed emergent cath/PCI .  He had an DTB time of 19 minutes from arrival to the lab and his chest pain duration was less than 60 minutes. Due to disease beyond the initial occlusion he underwent tandem  DES stenting with an excellent result.  He has been demonstrated to have complete myocardial salvage and his ECG is normal. .  He has not had any recurrent episodes of chest pain or palpitations.  He remains active and walks at least 2 miles per day, does gardening, and often splits wood.  When I last saw him, I reduced his Brilinta dose to 60 mg twice a day and he is continued to take aspirin 81 mg.  He continues to be on low-dose lisinopril at 2.5 mg.  His blood pressure is stable.  When I saw him at his last visit his cardiac murmur suggested possible mitral regurgitation.  I reviewed his echo Doppler study with him  in detail.  This again demonstrates complete salvage myocardium and there is evidence for mild MR and mild PR.  There is trivial AR and TR.  He continues to be on atorvastatin 80 mg and lipid studies are excellent with an LDL cholesterol at 49, total cholesterol 119, HDL 60 and TG 51.  I have recommended that in 6 months he undergo 1 year laboratory evaluation and I will see him in the office in follow-up and further recommendations will be made at that time.  Medication Adjustments/Labs and Tests Ordered: Current medicines are reviewed at length with the patient today.  Concerns regarding medicines are outlined above.  Medication changes, Labs and Tests ordered today are listed in the Patient Instructions below.  Patient Instructions  Medication Instructions:  Your physician recommends that you continue on your current medications as directed. Please refer to the Current Medication list given to you today.  Labwork: Please return for FASTING labs in 6 months (CMET, CBC, Lipid, TSH)-lab orders will be mailed at that time.  Our in office lab hours are Monday-Friday 8:00-4:00, closed for lunch 12:45-1:45 pm.  No appointment needed.  Follow-Up: Your physician wants you to follow-up in: 6 months with Dr. Claiborne Billings.  You will receive a reminder letter in the mail two months in advance. If you don't receive a letter, please call our office to schedule the follow-up appointment.   Any Other Special Instructions Will Be Listed Below (If Applicable).     If you need a refill on your cardiac medications before your next appointment, please call your pharmacy.      Signed, Timothy Majestic, MD , [facility] 05/06/2018 6:21 PM    [facility] [address] [place], [place], [place]  [address] Phone: [phone]

## 2018-05-06 NOTE — Progress Notes (Signed)
05/06/2018 9:19 AM   DOB: Dec 07, 1940 / MRN: [id number]  SUBJECTIVE:  Timothy Escobar is a 77 y.o. male presenting for follow up of oral lesions. He tells me that he is "100% better."    He has No Known Allergies.   He  has a past medical history of Blood transfusion without reported diagnosis, CAD (coronary artery disease), Depression, Dyslipidemia, and Skin cancer.    He  reports that he quit smoking about 51 years ago. He has never used smokeless tobacco. He reports that he drinks about 4.2 oz of alcohol per week. He reports that he does not use drugs. He  reports that he does not engage in sexual activity. The patient  has a past surgical history that includes Appendectomy; Hernia repair; vocal fold polyp removed; Cardiac catheterization (N/A, 08/22/2016); Cardiac catheterization (N/A, 08/22/2016); and parotid gland cyst removal (Right, 2012).  His family history includes Cancer in his brother, brother, father, paternal grandmother, sister, and sister; Dementia in his mother; Heart disease in his brother, maternal grandmother, and sister; Hypertension in his sister; Stroke in his maternal grandmother.  Review of Systems  Constitutional: Negative for chills, diaphoresis and fever.  HENT: Negative for sore throat.   Gastrointestinal: Negative for nausea.  Skin: Negative for rash.  Neurological: Negative for dizziness.    The problem list and medications were reviewed and updated by myself where necessary and exist elsewhere in the encounter.   OBJECTIVE:  BP 128/67   Pulse 65   Temp 98.2 F (36.8 C) (Oral)   Resp 18   Ht 5' 7.32" (1.71 m)   Wt 169 lb 9.6 oz (76.9 kg)   SpO2 94%   BMI 26.31 kg/m   Wt Readings from Last 3 Encounters:  05/06/18 169 lb 9.6 oz (76.9 kg)  04/29/18 169 lb 6.4 oz (76.8 kg)  01/30/18 165 lb (74.8 kg)   Temp Readings from Last 3 Encounters:  05/06/18 98.2 F (36.8 C) (Oral)  04/29/18 97.7 F (36.5 C)  01/30/18 98.2 F (36.8 C)   BP  Readings from Last 3 Encounters:  05/06/18 128/67  04/29/18 129/73  01/30/18 132/76   Pulse Readings from Last 3 Encounters:  05/06/18 65  04/29/18 (!) 54  01/30/18 68    Physical Exam  HENT:  Mouth/Throat: Uvula is midline, oropharynx is clear and moist and mucous membranes are normal.  Oral cavity negative for any lesions.     Lab Results  Component Value Date   HGBA1C 5.5 08/23/2016    Lab Results  Component Value Date   WBC 3.5 10/03/2017   HGB 14.1 10/03/2017   HCT 40.6 10/03/2017   MCV 95 10/03/2017   PLT 228 10/03/2017    Lab Results  Component Value Date   CREATININE 0.88 10/03/2017   BUN 16 10/03/2017   NA 143 10/03/2017   K 4.4 10/03/2017   CL 103 10/03/2017   CO2 27 10/03/2017    Lab Results  Component Value Date   ALT 22 10/03/2017   AST 23 10/03/2017   ALKPHOS 75 10/03/2017   BILITOT 0.8 10/03/2017    Lab Results  Component Value Date   TSH 1.050 10/03/2017    Lab Results  Component Value Date   CHOL 119 10/03/2017   HDL 60 10/03/2017   LDLCALC 49 10/03/2017   TRIG 51 10/03/2017   CHOLHDL 2.0 10/03/2017     ASSESSMENT AND PLAN:  Timothy Escobar was seen today for oral swelling.  Diagnoses and all  orders for this visit:  Papulovesicular rash Comments: Resolved.  FU prn.     The patient is advised to call or return to clinic if he does not see an improvement in symptoms, or to seek the care of the closest emergency department if he worsens with the above plan.   Philis Fendt, MHS, PA-C [facility] 05/06/2018 9:19 AM

## 2018-05-07 ENCOUNTER — Other Ambulatory Visit: Payer: Self-pay | Admitting: Internal Medicine

## 2018-05-08 DIAGNOSIS — H01024 Squamous blepharitis left upper eyelid: Secondary | ICD-10-CM | POA: Diagnosis not present

## 2018-05-08 DIAGNOSIS — H04123 Dry eye syndrome of bilateral lacrimal glands: Secondary | ICD-10-CM | POA: Diagnosis not present

## 2018-05-08 DIAGNOSIS — H01025 Squamous blepharitis left lower eyelid: Secondary | ICD-10-CM | POA: Diagnosis not present

## 2018-05-08 DIAGNOSIS — H01021 Squamous blepharitis right upper eyelid: Secondary | ICD-10-CM | POA: Diagnosis not present

## 2018-05-08 DIAGNOSIS — H01022 Squamous blepharitis right lower eyelid: Secondary | ICD-10-CM | POA: Diagnosis not present

## 2018-05-09 ENCOUNTER — Ambulatory Visit: Payer: Medicare Other | Admitting: Family Medicine

## 2018-05-09 ENCOUNTER — Encounter: Payer: Self-pay | Admitting: Family Medicine

## 2018-05-09 ENCOUNTER — Other Ambulatory Visit: Payer: Self-pay

## 2018-05-09 VITALS — BP 120/78 | HR 53 | Temp 97.7°F | Ht 67.0 in | Wt 171.4 lb

## 2018-05-09 DIAGNOSIS — M545 Low back pain, unspecified: Secondary | ICD-10-CM

## 2018-05-09 DIAGNOSIS — R1011 Right upper quadrant pain: Secondary | ICD-10-CM | POA: Diagnosis not present

## 2018-05-09 LAB — POC MICROSCOPIC URINALYSIS (UMFC): MUCUS RE: ABSENT

## 2018-05-09 LAB — POCT URINALYSIS DIP (MANUAL ENTRY)
BILIRUBIN UA: NEGATIVE
BILIRUBIN UA: NEGATIVE mg/dL
Blood, UA: NEGATIVE
Glucose, UA: NEGATIVE mg/dL
LEUKOCYTES UA: NEGATIVE
NITRITE UA: NEGATIVE
PROTEIN UA: NEGATIVE mg/dL
Spec Grav, UA: 1.02 (ref 1.010–1.025)
Urobilinogen, UA: 0.2 E.U./dL
pH, UA: 6.5 (ref 5.0–8.0)

## 2018-05-09 NOTE — Progress Notes (Signed)
Subjective:    Patient ID: Timothy Escobar, male    DOB: [birth year], 77 y.o.   MRN: [id number]  HPI Timothy Escobar is a 77 y.o. male Presents today for: Chief Complaint  Patient presents with  . upper right quadrant of abdomen stabbing pain    goes throught to the lower right side of back. Lots of discomfort. going on and off for a few months   Right upper quadrant abdominal pain rating to back for the past few months. History of multiple medical problems including CAD with Prior MI in 2017.  PCI to the LAD.  Followed by Dr. Claiborne Billings with cardiology.  Last seen 3 days ago.  Reported asymptomatic at that visit.  Status post cardiac rehab.  Past few months has had occasional discomfort in abdomen, and had briefly mentioned at prior visit. Few times per week pain under R ribs that radiates to back. Improves on own in few hours or minutes. Sharp/stabbing pain.  Noted with twisting but no other radiation. No associated fever, nausea or vomiting. No chest pain, no dyspnea. No change with meals. No urinary symptoms. Improves at time with lying down. Other times not. No attempted med treatments. Concerned that statin use may have contributed to gallstones or cause. Back is sore with movement. NKI.  No rash.   Brother and sister had gallbladder disease.    Patient Active Problem List   Diagnosis Date Noted  . Bacterial conjunctivitis of both eyes 01/23/2018  . Sinus bradycardia 01/23/2017  . Acute bronchitis 11/21/2016  . Dyslipidemia   . CAD (coronary artery disease)   . Status post coronary artery stent placement   . Bradycardia   . Hypokalemia   . Hyperlipidemia with target LDL less than 70   . STEMI (ST elevation myocardial infarction) ([place]) 08/22/2016  . Acute ST elevation myocardial infarction (STEMI) involving left anterior descending (LAD) coronary artery ([facility]) 08/22/2016  . Hip pain 09/28/2012  . Chronic depression 09/28/2012   Past Medical History:  Diagnosis Date  . Blood  transfusion without reported diagnosis   . CAD (coronary artery disease)    a. 08/22/2016 Ant STEMI/PCI: LM nl, LAD 30ost, 100p (3.0x15 Synergy DES & 2.75x24 Synergy DES), LCX large/dominant/nl, RCA nondom, nl;  b. 08/2016 Echo: Ef 60-65%, no rwma.  . Depression   . Dyslipidemia   . Skin cancer    skin cancer   Past Surgical History:  Procedure Laterality Date  . APPENDECTOMY    . CARDIAC CATHETERIZATION N/A 08/22/2016   Procedure: Left Heart Cath and Coronary Angiography;  Surgeon: Troy Sine, MD;  Location: [facility];  Service: Cardiovascular;  Laterality: N/A;  . CARDIAC CATHETERIZATION N/A 08/22/2016   Procedure: Coronary Stent Intervention;  Surgeon: Troy Sine, MD;  Location: [facility];  Service: Cardiovascular;  Laterality: N/A;  MID LAD  . HERNIA REPAIR    . parotid gland cyst removal Right 2012  . vocal fold polyp removed     No Known Allergies Prior to Admission medications   Medication Sig Start Date End Date Taking? Authorizing Provider  acetaminophen (TYLENOL) 500 MG tablet Take 500 mg by mouth every 6 (six) hours as needed for pain or headache.   Yes [provider]  aspirin EC 81 MG EC tablet Take 1 tablet (81 mg total) by mouth daily. 08/26/16  Yes Maryellen Pile, MD  atorvastatin (LIPITOR) 80 MG tablet Take 1 tablet (80 mg total) by mouth daily at 6 PM.  07/04/17  Yes Troy Sine, MD  calcium-vitamin D (OSCAL WITH D) 500-200 MG-UNIT per tablet Take 1 tablet by mouth daily. 1,000 of Vit D3   Yes [provider]  cholecalciferol (VITAMIN D) 1000 units tablet Take 1,000 Units by mouth daily.   Yes [provider]  clobetasol ointment (TEMOVATE) 5.46 % Apply 1 application topically 2 (two) times daily as needed (rash).  08/30/15  Yes [provider]  FLUoxetine (PROZAC) 20 MG capsule Take 1 capsule (20 mg total) daily by mouth. 08/30/17  Yes Wendie Agreste, MD  Ginger Root POWD by Does not apply route. Mixes  1/2 teaspoon of powder into hot tea once daily   Yes [provider]  lisinopril (PRINIVIL,ZESTRIL) 2.5 MG tablet TAKE 1 TABLET BY MOUTH ONCE DAILY 02/26/18  Yes Troy Sine, MD  Multiple Vitamins-Minerals (MULTIVITAMIN WITH MINERALS) tablet Take 1 tablet by mouth once a week. Centrum Silver    Yes [provider]  nitroGLYCERIN (NITROSTAT) 0.4 MG SL tablet Place 1 tablet (0.4 mg total) under the tongue every 5 (five) minutes x 3 doses as needed for chest pain. 08/25/16  Yes Maryellen Pile, MD  ticagrelor (BRILINTA) 60 MG TABS tablet Take 1 tablet (60 mg total) by mouth 2 (two) times daily. 10/26/17  Yes Troy Sine, MD  vitamin C (ASCORBIC ACID) 500 MG tablet Take 500 mg by mouth daily.   Yes [provider]   Social History   Socioeconomic History  . Marital status: Married    Spouse name: Not on file  . Number of children: 2  . Years of education: college  . Highest education level: Not on file  Occupational History  . Occupation: Retired - Water quality scientist  Social Needs  . Financial resource strain: Not on file  . Food insecurity:    Worry: Not on file    Inability: Not on file  . Transportation needs:    Medical: Not on file    Non-medical: Not on file  Tobacco Use  . Smoking status: Former Smoker    Last attempt to quit: 01/22/1967    Years since quitting: 51.3  . Smokeless tobacco: Never Used  Substance and Sexual Activity  . Alcohol use: Yes    Alcohol/week: 4.2 oz    Types: 7 Standard drinks or equivalent per week    Comment: wine: 1 glass daily  . Drug use: No  . Sexual activity: Never  Lifestyle  . Physical activity:    Days per week: Not on file    Minutes per session: Not on file  . Stress: Not on file  Relationships  . Social connections:    Talks on phone: Not on file    Gets together: Not on file    Attends religious service: Not on file    Active member of club or organization: Not on file    Attends meetings of clubs or  organizations: Not on file    Relationship status: Not on file  . Intimate partner violence:    Fear of current or ex partner: Not on file    Emotionally abused: Not on file    Physically abused: Not on file    Forced sexual activity: Not on file  Other Topics Concern  . Not on file  Social History Narrative   Lives w/ wife in [place]. 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, plus the church choir.  Exercise walk/gardening daily for 1 hr plus    Review of Systems     Objective:   Physical Exam  Constitutional: He is oriented to person, place, and time. He appears well-developed and well-nourished.  HENT:  Head: Normocephalic and atraumatic.  Eyes: Pupils are equal, round, and reactive to light. EOM are normal.  Neck: No JVD present. Carotid bruit is not present.  Cardiovascular: Normal rate, regular rhythm and normal heart sounds.  No murmur heard. Pulmonary/Chest: Effort normal and breath sounds normal. He has no rales.  Abdominal: Soft. Bowel sounds are normal. He exhibits no distension. There is no tenderness. There is no guarding, no tenderness at McBurney's point and negative Murphy's sign.  Musculoskeletal: He exhibits no edema.       Back:  Neurological: He is alert and oriented to person, place, and time.  Skin: Skin is warm and dry. No rash noted.  Psychiatric: He has a normal mood and affect.  Vitals reviewed.  Vitals:   05/09/18 1203  BP: 120/78  Pulse: (!) 53  Temp: 97.7 F (36.5 C)  TempSrc: Oral  SpO2: 94%  Weight: 171 lb 6.4 oz (77.7 kg)  Height: 5\' 7"  (1.702 m)      Results for orders placed or performed in visit on 05/09/18  POCT urinalysis dipstick  Result Value Ref Range   Color, UA yellow yellow   Clarity, UA clear clear   Glucose, UA negative negative mg/dL   Bilirubin, UA negative negative   Ketones, POC UA negative negative mg/dL   Spec Grav, UA 1.020 1.010 - 1.025   Blood, UA  negative negative   pH, UA 6.5 5.0 - 8.0   Protein Ur, POC negative negative mg/dL   Urobilinogen, UA 0.2 0.2 or 1.0 E.U./dL   Nitrite, UA Negative Negative   Leukocytes, UA Negative Negative  POCT Microscopic Urinalysis ([facility])  Result Value Ref Range   WBC,UR,HPF,POC None None WBC/hpf   RBC,UR,HPF,POC None None RBC/hpf   Bacteria None None, Too numerous to count   Mucus Absent Absent   Epithelial Cells, UR Per Microscopy Few (A) None, Too numerous to count cells/hpf       Assessment & Plan:   Timothy Escobar is a 77 y.o. male RUQ abdominal pain - Plan: Comprehensive metabolic panel, Lipase, US Abdomen Limited RUQ, CBC, POCT urinalysis dipstick, POCT Microscopic Urinalysis ([facility])  Right-sided low back pain without sciatica, unspecified chronicity - Plan: Comprehensive metabolic panel, Lipase, US Abdomen Limited RUQ, CBC, POCT urinalysis dipstick, POCT Microscopic Urinalysis ([facility])  Intermittent right upper quadrant abdominal pain, differential includes gallbladder disease, but overall reassuring exam at present.  With right low back component, could be degenerative disc disease with radiating pain as well.  Will start with CBC, CMP, lipase and schedule ultrasound to evaluate gallbladder and gallstones.  Urinalysis in office was reassuring.  Tylenol, heat or ice and gentle range of motion for lumbar spine.  Further testing to be determined after above results.  No orders of the defined types were placed in this encounter.  Patient Instructions    I will check some testing for your gallbladder, but less likely acute issue given your current symptoms.  Low back pain appears to be more at the muscle.  Okay to try Tylenol as needed, heat or ice that area with gentle range of motion and stretches as needed.  Once I have some results back we can determine if other testing is needed.  Thank you for coming in today.  Return to the clinic or go to the nearest emergency room if any of your  symptoms worsen or new symptoms occur.  Back Pain, Adult Many adults have back pain from time to time. Common causes of back pain include:  A strained muscle or ligament.  Wear and tear (degeneration) of the spinal disks.  Arthritis.  A hit to the back.  Back pain can be short-lived (acute) or last a long time (chronic). A physical exam, lab tests, and imaging studies may be done to find the cause of your pain. Follow these instructions at home: Managing pain and stiffness  Take over-the-counter and prescription medicines only as told by your health care provider.  If directed, apply heat to the affected area as often as told by your health care provider. Use the heat source that your health care provider recommends, such as a moist heat pack or a heating pad. ? Place a towel between your skin and the heat source. ? Leave the heat on for 20-30 minutes. ? Remove the heat if your skin turns bright red. This is especially important if you are unable to feel pain, heat, or cold. You have a greater risk of getting burned.  If directed, apply ice to the injured area: ? Put ice in a plastic bag. ? Place a towel between your skin and the bag. ? Leave the ice on for 20 minutes, 2-3 times a day for the first 2-3 days. Activity  Do not stay in bed. Resting more than 1-2 days can delay your recovery.  Take short walks on even surfaces as soon as you are able. Try to increase the length of time you walk each day.  Do not sit, drive, or stand in one place for more than 30 minutes at a time. Sitting or standing for long periods of time can put stress on your back.  Use proper lifting techniques. When you bend and lift, use positions that put less stress on your back: ? [place] your knees. ? Keep the load close to your body. ? Avoid twisting.  Exercise regularly as told by your health care provider. Exercising will help your back heal faster. This also helps prevent back injuries by keeping  muscles strong and flexible.  Your health care provider may recommend that you see a physical therapist. This person can help you come up with a safe exercise program. Do any exercises as told by your physical therapist. Lifestyle  Maintain a healthy weight. Extra weight puts stress on your back and makes it difficult to have good posture.  Avoid activities or situations that make you feel anxious or stressed. Learn ways to manage anxiety and stress. One way to manage stress is through exercise. Stress and anxiety increase muscle tension and can make back pain worse. General instructions  Sleep on a firm mattress in a comfortable position. Try lying on your side with your knees slightly bent. If you lie on your back, put a pillow under your knees.  Follow your treatment plan as told by your health care provider. This may include: ? Cognitive or behavioral therapy. ? Acupuncture or massage therapy. ? Meditation or yoga. Contact a health care provider if:  You have pain that is not relieved with rest or medicine.  You have increasing pain going down into your legs or buttocks.  Your pain does not improve in 2 weeks.  You have pain at night.  You lose weight.  You have a fever or chills. Get  help right away if:  You develop new bowel or bladder control problems.  You have unusual weakness or numbness in your arms or legs.  You develop nausea or vomiting.  You develop abdominal pain.  You feel faint. Summary  Many adults have back pain from time to time. A physical exam, lab tests, and imaging studies may be done to find the cause of your pain.  Use proper lifting techniques. When you bend and lift, use positions that put less stress on your back.  Take over-the-counter and prescription medicines and apply heat or ice as directed by your health care provider. This information is not intended to replace advice given to you by your health care provider. Make sure you discuss  any questions you have with your health care provider. Document Released: 10/09/2005 Document Revised: 11/13/2016 Document Reviewed: 11/13/2016 Elsevier Interactive Patient Education  2018 [employer].  Abdominal Pain, Adult Abdominal pain can be caused by many things. Often, abdominal pain is not serious and it gets better with no treatment or by being treated at home. However, sometimes abdominal pain is serious. Your health care provider will do a medical history and a physical exam to try to determine the cause of your abdominal pain. Follow these instructions at home:  Take over-the-counter and prescription medicines only as told by your health care provider. Do not take a laxative unless told by your health care provider.  Drink enough fluid to keep your urine clear or pale yellow.  Watch your condition for any changes.  Keep all follow-up visits as told by your health care provider. This is important. Contact a health care provider if:  Your abdominal pain changes or gets worse.  You are not hungry or you lose weight without trying.  You are constipated or have diarrhea for more than 2-3 days.  You have pain when you urinate or have a bowel movement.  Your abdominal pain wakes you up at night.  Your pain gets worse with meals, after eating, or with certain foods.  You are throwing up and cannot keep anything down.  You have a fever. Get help right away if:  Your pain does not go away as soon as your health care provider told you to expect.  You cannot stop throwing up.  Your pain is only in areas of the abdomen, such as the right side or the left lower portion of the abdomen.  You have bloody or black stools, or stools that look like tar.  You have severe pain, cramping, or bloating in your abdomen.  You have signs of dehydration, such as: ? Dark urine, very little urine, or no urine. ? Cracked lips. ? Dry mouth. ? Sunken  eyes. ? Sleepiness. ? Weakness. This information is not intended to replace advice given to you by your health care provider. Make sure you discuss any questions you have with your health care provider. Document Released: 07/19/2005 Document Revised: 04/28/2016 Document Reviewed: 03/22/2016 Elsevier Interactive Patient Education  2018 [employer].    IF you received an x-ray today, you will receive an invoice from [facility]. Please contact [facility] at [phone] with questions or concerns regarding your invoice.   IF you received labwork today, you will receive an invoice from [facility]. Please contact [facility] at [phone] with questions or concerns regarding your invoice.   Our billing staff will not be able to assist you with questions regarding bills from these companies.  You will be contacted with the lab  results as soon as they are available. The fastest way to get your results is to activate your My Chart account. Instructions are located on the last page of this paperwork. If you have not heard from [place] regarding the results in 2 weeks, please contact this office.      Signed,   Merri Ray, MD [facility].  05/12/18 12:34 PM

## 2018-05-09 NOTE — Patient Instructions (Addendum)
I will check some testing for your gallbladder, but less likely acute issue given your current symptoms.  Low back pain appears to be more at the muscle.  Okay to try Tylenol as needed, heat or ice that area with gentle range of motion and stretches as needed.  Once I have some results back we can determine if other testing is needed.  Thank you for coming in today.   Return to the clinic or go to the nearest emergency room if any of your symptoms worsen or new symptoms occur.  Back Pain, Adult Many adults have back pain from time to time. Common causes of back pain include:  A strained muscle or ligament.  Wear and tear (degeneration) of the spinal disks.  Arthritis.  A hit to the back.  Back pain can be short-lived (acute) or last a long time (chronic). A physical exam, lab tests, and imaging studies may be done to find the cause of your pain. Follow these instructions at home: Managing pain and stiffness  Take over-the-counter and prescription medicines only as told by your health care provider.  If directed, apply heat to the affected area as often as told by your health care provider. Use the heat source that your health care provider recommends, such as a moist heat pack or a heating pad. ? Place a towel between your skin and the heat source. ? Leave the heat on for 20-30 minutes. ? Remove the heat if your skin turns bright red. This is especially important if you are unable to feel pain, heat, or cold. You have a greater risk of getting burned.  If directed, apply ice to the injured area: ? Put ice in a plastic bag. ? Place a towel between your skin and the bag. ? Leave the ice on for 20 minutes, 2-3 times a day for the first 2-3 days. Activity  Do not stay in bed. Resting more than 1-2 days can delay your recovery.  Take short walks on even surfaces as soon as you are able. Try to increase the length of time you walk each day.  Do not sit, drive, or stand in one place  for more than 30 minutes at a time. Sitting or standing for long periods of time can put stress on your back.  Use proper lifting techniques. When you bend and lift, use positions that put less stress on your back: ? [place] your knees. ? Keep the load close to your body. ? Avoid twisting.  Exercise regularly as told by your health care provider. Exercising will help your back heal faster. This also helps prevent back injuries by keeping muscles strong and flexible.  Your health care provider may recommend that you see a physical therapist. This person can help you come up with a safe exercise program. Do any exercises as told by your physical therapist. Lifestyle  Maintain a healthy weight. Extra weight puts stress on your back and makes it difficult to have good posture.  Avoid activities or situations that make you feel anxious or stressed. Learn ways to manage anxiety and stress. One way to manage stress is through exercise. Stress and anxiety increase muscle tension and can make back pain worse. General instructions  Sleep on a firm mattress in a comfortable position. Try lying on your side with your knees slightly bent. If you lie on your back, put a pillow under your knees.  Follow your treatment plan as told by your health care provider. This may  include: ? Cognitive or behavioral therapy. ? Acupuncture or massage therapy. ? Meditation or yoga. Contact a health care provider if:  You have pain that is not relieved with rest or medicine.  You have increasing pain going down into your legs or buttocks.  Your pain does not improve in 2 weeks.  You have pain at night.  You lose weight.  You have a fever or chills. Get help right away if:  You develop new bowel or bladder control problems.  You have unusual weakness or numbness in your arms or legs.  You develop nausea or vomiting.  You develop abdominal pain.  You feel faint. Summary  Many adults have back pain from  time to time. A physical exam, lab tests, and imaging studies may be done to find the cause of your pain.  Use proper lifting techniques. When you bend and lift, use positions that put less stress on your back.  Take over-the-counter and prescription medicines and apply heat or ice as directed by your health care provider. This information is not intended to replace advice given to you by your health care provider. Make sure you discuss any questions you have with your health care provider. Document Released: 10/09/2005 Document Revised: 11/13/2016 Document Reviewed: 11/13/2016 Elsevier Interactive Patient Education  2018 [place].  Abdominal Pain, Adult Abdominal pain can be caused by many things. Often, abdominal pain is not serious and it gets better with no treatment or by being treated at home. However, sometimes abdominal pain is serious. Your health care provider will do a medical history and a physical exam to try to determine the cause of your abdominal pain. Follow these instructions at home:  Take over-the-counter and prescription medicines only as told by your health care provider. Do not take a laxative unless told by your health care provider.  Drink enough fluid to keep your urine clear or pale yellow.  Watch your condition for any changes.  Keep all follow-up visits as told by your health care provider. This is important. Contact a health care provider if:  Your abdominal pain changes or gets worse.  You are not hungry or you lose weight without trying.  You are constipated or have diarrhea for more than 2-3 days.  You have pain when you urinate or have a bowel movement.  Your abdominal pain wakes you up at night.  Your pain gets worse with meals, after eating, or with certain foods.  You are throwing up and cannot keep anything down.  You have a fever. Get help right away if:  Your pain does not go away as soon as your health care provider told you to  expect.  You cannot stop throwing up.  Your pain is only in areas of the abdomen, such as the right side or the left lower portion of the abdomen.  You have bloody or black stools, or stools that look like tar.  You have severe pain, cramping, or bloating in your abdomen.  You have signs of dehydration, such as: ? Dark urine, very little urine, or no urine. ? Cracked lips. ? Dry mouth. ? Sunken eyes. ? Sleepiness. ? Weakness. This information is not intended to replace advice given to you by your health care provider. Make sure you discuss any questions you have with your health care provider. Document Released: 07/19/2005 Document Revised: 04/28/2016 Document Reviewed: 03/22/2016 Elsevier Interactive Patient Education  2018 [employer].    IF you received an x-ray today, you will  receive an Pharmacologist from [facility]. Please contact [facility] at [phone] with questions or concerns regarding your invoice.   IF you received labwork today, you will receive an invoice from [facility]. Please contact [facility] at [phone] with questions or concerns regarding your invoice.   Our billing staff will not be able to assist you with questions regarding bills from these companies.  You will be contacted with the lab results as soon as they are available. The fastest way to get your results is to activate your My Chart account. Instructions are located on the last page of this paperwork. If you have not heard from [place] regarding the results in 2 weeks, please contact this office.

## 2018-05-10 ENCOUNTER — Other Ambulatory Visit: Payer: Self-pay | Admitting: Internal Medicine

## 2018-05-10 LAB — COMPREHENSIVE METABOLIC PANEL
ALBUMIN: 4.6 g/dL (ref 3.5–4.8)
ALK PHOS: 81 IU/L (ref 39–117)
ALT: 21 IU/L (ref 0–44)
AST: 24 IU/L (ref 0–40)
Albumin/Globulin Ratio: 2 (ref 1.2–2.2)
BILIRUBIN TOTAL: 0.5 mg/dL (ref 0.0–1.2)
BUN / CREAT RATIO: 20 (ref 10–24)
BUN: 18 mg/dL (ref 8–27)
CHLORIDE: 102 mmol/L (ref 96–106)
CO2: 22 mmol/L (ref 20–29)
Calcium: 9.2 mg/dL (ref 8.6–10.2)
Creatinine, Ser: 0.91 mg/dL (ref 0.76–1.27)
GFR calc Af Amer: 95 mL/min/{1.73_m2} (ref >59)
GFR calc non Af Amer: 82 mL/min/{1.73_m2} (ref >59)
GLUCOSE: 88 mg/dL (ref 65–99)
Globulin, Total: 2.3 g/dL (ref 1.5–4.5)
Potassium: 4.6 mmol/L (ref 3.5–5.2)
Sodium: 142 mmol/L (ref 134–144)
Total Protein: 6.9 g/dL (ref 6.0–8.5)

## 2018-05-10 LAB — CBC
Hematocrit: 42.4 % (ref 37.5–51.0)
Hemoglobin: 14.3 g/dL (ref 13.0–17.7)
MCH: 31.4 pg (ref 26.6–33.0)
MCHC: 33.7 g/dL (ref 31.5–35.7)
MCV: 93 fL (ref 79–97)
PLATELETS: 205 10*3/uL (ref 150–450)
RBC: 4.55 x10E6/uL (ref 4.14–5.80)
RDW: 13.9 % (ref 12.3–15.4)
WBC: 4.9 10*3/uL (ref 3.4–10.8)

## 2018-05-10 LAB — LIPASE: LIPASE: 17 U/L (ref 13–78)

## 2018-05-11 ENCOUNTER — Encounter: Payer: Self-pay | Admitting: Cardiovascular Disease

## 2018-05-12 ENCOUNTER — Encounter: Payer: Self-pay | Admitting: Family Medicine

## 2018-05-13 ENCOUNTER — Telehealth: Payer: Self-pay | Admitting: Cardiovascular Disease

## 2018-05-13 ENCOUNTER — Other Ambulatory Visit: Payer: Self-pay

## 2018-05-13 MED ORDER — NITROGLYCERIN 0.4 MG SL SUBL: 0.4000 mg | SUBLINGUAL_TABLET | SUBLINGUAL | 6 refills | Status: DC | PRN

## 2018-05-13 NOTE — Telephone Encounter (Signed)
New Message    *STAT* If patient is at the pharmacy, call can be transferred to refill team.   1. Which medications need to be refilled? (please list name of each medication and dose if known) nitroGLYCERIN (NITROSTAT) 0.4 MG SL tablet  2. Which pharmacy/location (including street and city if local pharmacy) is medication to be sent to? [facility], [place].  3. Do they need a 30 day or 90 day supply? [place]

## 2018-06-06 ENCOUNTER — Ambulatory Visit
Admission: RE | Admit: 2018-06-06 | Discharge: 2018-06-06 | Disposition: A | Discharge status: Home / Self Care | Payer: Medicare Other | Source: Ambulatory Visit | Attending: Family Medicine | Admitting: Family Medicine

## 2018-06-06 DIAGNOSIS — R1011 Right upper quadrant pain: Secondary | ICD-10-CM | POA: Diagnosis not present

## 2018-06-06 DIAGNOSIS — M545 Low back pain, unspecified: Secondary | ICD-10-CM

## 2018-06-10 ENCOUNTER — Encounter: Payer: Self-pay | Admitting: Family Medicine

## 2018-07-10 DIAGNOSIS — R1011 Right upper quadrant pain: Secondary | ICD-10-CM | POA: Diagnosis not present

## 2018-07-11 ENCOUNTER — Telehealth: Payer: Self-pay

## 2018-07-11 NOTE — Telephone Encounter (Signed)
   [facility] Pre-operative Risk Assessment    Request for surgical clearance:  1. What type of surgery is being performed?  Colonoscopy/endoscopy   2. When is this surgery scheduled? 08/12/2018   3. What type of clearance is required (medical clearance vs. Pharmacy clearance to hold med vs. Both)? Pharmacy  4. Are there any medications that need to be held prior to surgery and how long? Brilinta, Not specified     5. Practice name and name of physician performing surgery?   [facility], Endoscopy, Dr. Alessandra Bevels  6. What is your office phone number? [phone]    7.   What is your office fax number? [phone]  8.   Anesthesia type (None, local, MAC, general) ?  Not specified   Jacqulynn Cadet 07/11/2018, 4:53 PM  _________________________________________________________________   (provider comments below)

## 2018-07-12 NOTE — Telephone Encounter (Signed)
   Primary Cardiologist: Shelva Majestic, MD  Chart reviewed as part of pre-operative protocol coverage. Patient was contacted 07/12/2018 in reference to pre-operative risk assessment for pending surgery as outlined below.  Timothy Escobar was last seen on 05/06/18 by Dr. Claiborne Billings.  Since that day, Timothy Escobar has done well w/o exertional CP or dyspnea. He can perform >4 METs of physical activity w/o angina.  Therefore, based on ACC/AHA guidelines, the patient would be at acceptable risk for the planned procedure without further cardiovascular testing.   Hold Brilinta 5 days prior to colonoscopy/endoscopy and resume as soon as safe to do so from a surgical standpoint.   I will route this recommendation to the requesting party via Epic fax function and remove from pre-op pool.  Please call with questions.  Lyda Jester, PA-C 07/12/2018, 8:53 AM

## 2018-07-15 ENCOUNTER — Other Ambulatory Visit: Payer: Self-pay | Admitting: Family Medicine

## 2018-07-15 DIAGNOSIS — R1011 Right upper quadrant pain: Secondary | ICD-10-CM

## 2018-07-19 ENCOUNTER — Ambulatory Visit
Admission: RE | Admit: 2018-07-19 | Discharge: 2018-07-19 | Disposition: A | Discharge status: Home / Self Care | Payer: Medicare Other | Source: Ambulatory Visit | Attending: Family Medicine | Admitting: Family Medicine

## 2018-07-19 DIAGNOSIS — K5790 Diverticulosis of intestine, part unspecified, without perforation or abscess without bleeding: Secondary | ICD-10-CM | POA: Diagnosis not present

## 2018-07-19 DIAGNOSIS — R1011 Right upper quadrant pain: Secondary | ICD-10-CM

## 2018-07-19 MED ORDER — IOPAMIDOL (ISOVUE-300) INJECTION 61%
100.0000 mL | Freq: Once | INTRAVENOUS | Status: AC | PRN
  Administered 2018-07-19: 100 mL via INTRAVENOUS

## 2018-07-22 ENCOUNTER — Encounter: Payer: Self-pay | Admitting: Family Medicine

## 2018-07-29 ENCOUNTER — Telehealth: Payer: Self-pay | Admitting: Cardiovascular Disease

## 2018-07-29 ENCOUNTER — Other Ambulatory Visit: Payer: Self-pay | Admitting: *Deleted

## 2018-07-29 DIAGNOSIS — I10 Essential (primary) hypertension: Secondary | ICD-10-CM

## 2018-07-29 DIAGNOSIS — Z79899 Other long term (current) drug therapy: Secondary | ICD-10-CM

## 2018-07-29 DIAGNOSIS — E785 Hyperlipidemia, unspecified: Secondary | ICD-10-CM

## 2018-07-29 NOTE — Telephone Encounter (Signed)
Labs orders placed. Pt notified via VM.

## 2018-07-29 NOTE — Telephone Encounter (Signed)
New Message:      Pt needs lab orders put in the system before his appt on 11/04/18. The pt would like to come about 2 wks prior to his appt.

## 2018-08-12 DIAGNOSIS — K5730 Diverticulosis of large intestine without perforation or abscess without bleeding: Secondary | ICD-10-CM | POA: Diagnosis not present

## 2018-08-12 DIAGNOSIS — K648 Other hemorrhoids: Secondary | ICD-10-CM | POA: Diagnosis not present

## 2018-08-12 DIAGNOSIS — Z1211 Encounter for screening for malignant neoplasm of colon: Secondary | ICD-10-CM | POA: Diagnosis not present

## 2018-08-12 DIAGNOSIS — D122 Benign neoplasm of ascending colon: Secondary | ICD-10-CM | POA: Diagnosis not present

## 2018-08-12 DIAGNOSIS — D128 Benign neoplasm of rectum: Secondary | ICD-10-CM | POA: Diagnosis not present

## 2018-08-12 DIAGNOSIS — K635 Polyp of colon: Secondary | ICD-10-CM | POA: Diagnosis not present

## 2018-08-12 DIAGNOSIS — D123 Benign neoplasm of transverse colon: Secondary | ICD-10-CM | POA: Diagnosis not present

## 2018-08-14 DIAGNOSIS — D122 Benign neoplasm of ascending colon: Secondary | ICD-10-CM | POA: Diagnosis not present

## 2018-08-14 DIAGNOSIS — Z1211 Encounter for screening for malignant neoplasm of colon: Secondary | ICD-10-CM | POA: Diagnosis not present

## 2018-08-14 DIAGNOSIS — D128 Benign neoplasm of rectum: Secondary | ICD-10-CM | POA: Diagnosis not present

## 2018-08-14 DIAGNOSIS — K635 Polyp of colon: Secondary | ICD-10-CM | POA: Diagnosis not present

## 2018-08-21 ENCOUNTER — Ambulatory Visit (INDEPENDENT_AMBULATORY_CARE_PROVIDER_SITE_OTHER): Payer: Medicare Other | Admitting: Physician Assistant

## 2018-08-21 DIAGNOSIS — Z23 Encounter for immunization: Secondary | ICD-10-CM

## 2018-09-09 ENCOUNTER — Other Ambulatory Visit: Payer: Self-pay | Admitting: Family Medicine

## 2018-09-09 DIAGNOSIS — F32A Depression, unspecified: Secondary | ICD-10-CM

## 2018-09-09 DIAGNOSIS — F329 Major depressive disorder, single episode, unspecified: Secondary | ICD-10-CM

## 2018-09-10 NOTE — Telephone Encounter (Signed)
Requested Prescriptions  Pending Prescriptions Disp Refills  . FLUoxetine (PROZAC) 20 MG capsule [Pharmacy Med Name: FLUOXETINE 20MG      CAP] 90 capsule 3    Sig: TAKE 1 CAPSULE BY MOUTH ONCE DAILY     Psychiatry:  Antidepressants - SSRI Passed - 09/09/2018  9:53 AM      Passed - Completed PHQ-2 or PHQ-9 in the last 360 days.      Passed - Valid encounter within last 6 months    Recent Outpatient Visits          4 months ago RUQ abdominal pain   [facility], Ranell Patrick, MD   4 months ago Papulovesicular rash   [facility] at [facility], PA-C   4 months ago Papulovesicular rash   [facility], PA-C   7 months ago Nasal congestion   [facility] at [facility], PA-C   7 months ago Bacterial conjunctivitis of both eyes   Primary Care at [facility], [place], [place]

## 2018-10-02 ENCOUNTER — Other Ambulatory Visit: Payer: Self-pay | Admitting: Cardiovascular Disease

## 2018-10-28 DIAGNOSIS — E785 Hyperlipidemia, unspecified: Secondary | ICD-10-CM | POA: Diagnosis not present

## 2018-10-28 DIAGNOSIS — I10 Essential (primary) hypertension: Secondary | ICD-10-CM | POA: Diagnosis not present

## 2018-10-28 DIAGNOSIS — Z79899 Other long term (current) drug therapy: Secondary | ICD-10-CM | POA: Diagnosis not present

## 2018-10-29 LAB — COMPREHENSIVE METABOLIC PANEL
ALK PHOS: 78 IU/L (ref 39–117)
ALT: 19 IU/L (ref 0–44)
AST: 19 IU/L (ref 0–40)
Albumin/Globulin Ratio: 2.6 — ABNORMAL HIGH (ref 1.2–2.2)
Albumin: 4.4 g/dL (ref 3.5–4.8)
BILIRUBIN TOTAL: 0.7 mg/dL (ref 0.0–1.2)
BUN/Creatinine Ratio: 20 (ref 10–24)
BUN: 20 mg/dL (ref 8–27)
CHLORIDE: 102 mmol/L (ref 96–106)
CO2: 23 mmol/L (ref 20–29)
Calcium: 8.7 mg/dL (ref 8.6–10.2)
Creatinine, Ser: 0.99 mg/dL (ref 0.76–1.27)
GFR calc non Af Amer: 74 mL/min/{1.73_m2} (ref >59)
GFR, EST AFRICAN AMERICAN: 86 mL/min/{1.73_m2} (ref >59)
GLUCOSE: 96 mg/dL (ref 65–99)
Globulin, Total: 1.7 g/dL (ref 1.5–4.5)
POTASSIUM: 4.2 mmol/L (ref 3.5–5.2)
Sodium: 140 mmol/L (ref 134–144)
TOTAL PROTEIN: 6.1 g/dL (ref 6.0–8.5)

## 2018-10-29 LAB — CBC
HEMATOCRIT: 41.2 % (ref 37.5–51.0)
HEMOGLOBIN: 14.2 g/dL (ref 13.0–17.7)
MCH: 32.6 pg (ref 26.6–33.0)
MCHC: 34.5 g/dL (ref 31.5–35.7)
MCV: 95 fL (ref 79–97)
Platelets: 185 10*3/uL (ref 150–450)
RBC: 4.36 x10E6/uL (ref 4.14–5.80)
RDW: 12.4 % (ref 11.6–15.4)
WBC: 4.2 10*3/uL (ref 3.4–10.8)

## 2018-10-29 LAB — LIPID PANEL
CHOLESTEROL TOTAL: 125 mg/dL (ref 100–199)
Chol/HDL Ratio: 1.9 ratio (ref 0.0–5.0)
HDL: 65 mg/dL (ref >39)
LDL Calculated: 49 mg/dL (ref 0–99)
TRIGLYCERIDES: 53 mg/dL (ref 0–149)
VLDL Cholesterol Cal: 11 mg/dL (ref 5–40)

## 2018-10-29 LAB — TSH: TSH: 1.13 u[IU]/mL (ref 0.450–4.500)

## 2018-11-04 ENCOUNTER — Ambulatory Visit: Payer: Medicare Other | Admitting: Cardiovascular Disease

## 2018-11-04 ENCOUNTER — Encounter: Payer: Self-pay | Admitting: Cardiovascular Disease

## 2018-11-04 VITALS — BP 128/72 | HR 57 | Ht 69.0 in | Wt 174.4 lb

## 2018-11-04 DIAGNOSIS — I700 Atherosclerosis of aorta: Secondary | ICD-10-CM | POA: Diagnosis not present

## 2018-11-04 DIAGNOSIS — R011 Cardiac murmur, unspecified: Secondary | ICD-10-CM | POA: Diagnosis not present

## 2018-11-04 DIAGNOSIS — I10 Essential (primary) hypertension: Secondary | ICD-10-CM

## 2018-11-04 DIAGNOSIS — I2109 ST elevation (STEMI) myocardial infarction involving other coronary artery of anterior wall: Secondary | ICD-10-CM

## 2018-11-04 DIAGNOSIS — E785 Hyperlipidemia, unspecified: Secondary | ICD-10-CM

## 2018-11-04 MED ORDER — LISINOPRIL 2.5 MG PO TABS: 2.5000 mg | ORAL_TABLET | Freq: Every day | ORAL | 2 refills | Status: DC

## 2018-11-04 MED ORDER — TICAGRELOR 60 MG PO TABS: 60.0000 mg | ORAL_TABLET | Freq: Two times a day (BID) | ORAL | 12 refills | Status: DC

## 2018-11-04 MED ORDER — ATORVASTATIN CALCIUM 80 MG PO TABS: ORAL_TABLET | ORAL | 3 refills | Status: DC

## 2018-11-04 NOTE — Patient Instructions (Signed)
Medication Instructions:  The current medical regimen is effective;  continue present plan and medications.  If you need a refill on your cardiac medications before your next appointment, please call your pharmacy.   Follow-Up: At [facility], you and your health needs are our priority.  As part of our continuing mission to provide you with exceptional heart care, we have created designated Provider Care Teams.  These Care Teams include your primary Cardiologist (physician) and Advanced Practice Providers (APPs -  Physician Assistants and Nurse Practitioners) who all work together to provide you with the care you need, when you need it. You will need a follow up appointment in 9 months (October 2020).  Please call our office 2 months in advance to schedule this appointment.  You may see Shelva Majestic, MD or one of the following Advanced Practice Providers on your designated Care Team: [place], [place] . Fabian Sharp, PA-C

## 2018-11-04 NOTE — Progress Notes (Signed)
Cardiology Office Note    Date:  11/04/2018   ID:  Timothy, Escobar 25-Feb-1941, MRN [id number]  PCP:  Wendie Agreste, MD  Cardiologist:  Shelva Majestic, MD    History of Present Illness:  Timothy Escobar is a 78 y.o. male who sufferred an Anterior STEMI on 08/22/16.  I last saw him in July 2019.  He presents for a 6  month follow-up evaluation.   [name] is a 78 year old Caucasian male who was without cardiac history and developed acute onset of severe substernal chest pressure while he was moving logs in a wheelbarrow which commenced at approximately 5 PM on 08/22/16. EMS arrived within 15 minutes. His ECG in transient showed 11 mm of ST segment elevation in leads V3 through V6 consistent with an acute anterior injury current. A code STEMI was activated and he was brought emergently directly to the catheterization laboratory where I performed emergent catheterization. Catheterization revealed total occlusion of a large proximal LAD with TIMI 0 flow and a large dominant normal left circumflex vessel and small nondominant RCA. He underwent successful PCI to the LAD, treated with PTCA/DES stenting at the site of 100% occlusion with initial insertion of a 3.016 mm Synergy DES stent.  Once  TIMI-3 flow was established it was evident that there was segmental disease beyond the stented segment of 70 and 80% and tandem stenting with a 2.7524 mm Synergy DES stent was done with post stent dilatation from 3.23 mm in the proximal portion of the proximal stent to 3.05 mm at the distal portion of the distal stent. LVEDP was 13 mm Hg and the door to balloon time from arrival was 19 minutes. DTB from chest pain onset prior to EMS arrival was < 1 hour.  He ultimately completely resolved his 11 mm ST elevation, ECG normalized, and subsequent echo showed completely normal LV fx with EF 60-65% without any wall motion abnormality! He was discharged 2 days later.  Since his event he has remained  asymptomatic.  He completed participation in cardiac rehabilitation.  He is participating in cardiac rehab.  When I last saw him, he was remaining completely asymptomatic.  Over the past 6 months.  He continues to be asymptomatic.  He is active.  He splits wood.  He continues to sing in the [employer] choir.  Laboratory on October 03, 2017 showed a total cholesterol 119, HDL 60, LDL 49, triglycerides 51.  Hemoglobin A1c was 5.5.  Creatine 0.88.  TSH 1.05.  Normal LFTs.   I last saw him in July 2019 at which time he was continuing to be asymptomatic from a cardiovascular standpoint.  He remains active cutting his grass, cutting firewood, and walking.  He continues to sing in the [employer] choir.  H underwent  an echo Doppler study on Mar 05, 2018.  EF was 60 to 65% and he had normal diastolic parameters.  There was trivial AR, mild MR, trivial TR, and mild PR.    Over the past 6 months, he underwent evaluation by Dr. Cindee Lame for right upper quadrant abdominal stabbing pain.  CT of his abdomen on July 19, 2018 field evidence for very mild fatty infiltration of the liver.  His pancreas was unremarkable.  He was found to have a small accessory spleen.  Was mild tension of the right renal collecting system of uncertain significance.  There was no stone or obstructing mass.  Aortic atherosclerosis was present.  He did not  have any adenopathy.  He continues to be very active.  He continues to sing for the [employer] choir.  Recent laboratory on October 28, 2018 continue to show excellent lipid studies with total cholesterol 125, HDL 65, LDL 49, and triglycerides 53.  He denies chest pain PND orthopnea.  He denies palpitations.  He presents for reevaluation.  Past Medical History:  Diagnosis Date  . Blood transfusion without reported diagnosis   . CAD (coronary artery disease)    a. 08/22/2016 Ant STEMI/PCI: LM nl, LAD 30ost, 100p (3.0x15 Synergy DES &  2.75x24 Synergy DES), LCX large/dominant/nl, RCA nondom, nl;  b. 08/2016 Echo: Ef 60-65%, no rwma.  . Depression   . Dyslipidemia   . Skin cancer    skin cancer    Past Surgical History:  Procedure Laterality Date  . APPENDECTOMY    . CARDIAC CATHETERIZATION N/A 08/22/2016   Procedure: Left Heart Cath and Coronary Angiography;  Surgeon: Troy Sine, MD;  Location: [facility];  Service: Cardiovascular;  Laterality: N/A;  . CARDIAC CATHETERIZATION N/A 08/22/2016   Procedure: Coronary Stent Intervention;  Surgeon: Troy Sine, MD;  Location: [facility];  Service: Cardiovascular;  Laterality: N/A;  MID LAD  . HERNIA REPAIR    . parotid gland cyst removal Right 2012  . vocal fold polyp removed      Current Medications: Outpatient Medications Prior to Visit  Medication Sig Dispense Refill  . acetaminophen (TYLENOL) 500 MG tablet Take 500 mg by mouth every 6 (six) hours as needed for pain or headache.    Marland Kitchen aspirin EC 81 MG EC tablet Take 1 tablet (81 mg total) by mouth daily. 90 tablet 2  . atorvastatin (LIPITOR) 80 MG tablet TAKE 1 TABLET BY MOUTH ONCE DAILY AT 6 PM 90 tablet 3  . calcium-vitamin D (OSCAL WITH D) 500-200 MG-UNIT per tablet Take 1 tablet by mouth daily. 1,000 of Vit D3    . cholecalciferol (VITAMIN D) 1000 units tablet Take 1,000 Units by mouth daily.    . clobetasol ointment (TEMOVATE) 5.40 % Apply 1 application topically 2 (two) times daily as needed (rash).     Marland Kitchen FLUoxetine (PROZAC) 20 MG capsule TAKE 1 CAPSULE BY MOUTH ONCE DAILY 90 capsule 3  . Ginger Root POWD by Does not apply route. Mixes 1/2 teaspoon of powder into hot tea once daily    . lisinopril (PRINIVIL,ZESTRIL) 2.5 MG tablet TAKE 1 TABLET BY MOUTH ONCE DAILY 90 tablet 2  . Multiple Vitamins-Minerals (MULTIVITAMIN WITH MINERALS) tablet Take 1 tablet by mouth once a week. Centrum Silver     . nitroGLYCERIN (NITROSTAT) 0.4 MG SL tablet Place 1 tablet (0.4 mg total) under the tongue every 5  (five) minutes x 3 doses as needed for chest pain. 30 tablet 6  . ticagrelor (BRILINTA) 60 MG TABS tablet Take 1 tablet (60 mg total) by mouth 2 (two) times daily. 60 tablet 12  . vitamin C (ASCORBIC ACID) 500 MG tablet Take 500 mg by mouth daily.     No facility-administered medications prior to visit.      Allergies:   Patient has no known allergies.   Social History   Socioeconomic History  . Marital status: Married    Spouse name: Not on file  . Number of children: 2  . Years of education: college  . Highest education level: Not on file  Occupational History  . Occupation: Retired - Water quality scientist  Social Needs  .  Financial resource strain: Not on file  . Food insecurity:    Worry: Not on file    Inability: Not on file  . Transportation needs:    Medical: Not on file    Non-medical: Not on file  Tobacco Use  . Smoking status: Former Smoker    Last attempt to quit: 01/22/1967    Years since quitting: 51.8  . Smokeless tobacco: Never Used  Substance and Sexual Activity  . Alcohol use: Yes    Alcohol/week: 7.0 standard drinks    Types: 7 Standard drinks or equivalent per week    Comment: wine: 1 glass daily  . Drug use: No  . Sexual activity: Never  Lifestyle  . Physical activity:    Days per week: Not on file    Minutes per session: Not on file  . Stress: Not on file  Relationships  . Social connections:    Talks on phone: Not on file    Gets together: Not on file    Attends religious service: Not on file    Active member of club or organization: Not on file    Attends meetings of clubs or organizations: Not on file    Relationship status: Not on file  Other Topics Concern  . Not on file  Social History Narrative   Lives w/ wife in [place]. Both are singers, they sing with the [employer] in [place] and with [employer], plus the church choir.   Exercise walk/gardening daily for 1 hr plus     Family History:  The patient's family history includes  Cancer in his brother, brother, father, paternal grandmother, sister, and sister; Dementia in his mother; Heart disease in his brother, maternal grandmother, and sister; Hypertension in his sister; Stroke in his maternal grandmother.   ROS General: Negative; No fevers, chills, or night sweats;  HEENT: Negative; No changes in vision or hearing, sinus congestion, difficulty swallowing Pulmonary: Negative; No cough, wheezing, shortness of breath, hemoptysis Cardiovascular: Negative; No chest pain, presyncope, syncope, palpitations GI: resolved mild right upper quadrant tenderness. GU: Negative; No dysuria, hematuria, or difficulty voiding Musculoskeletal: Negative; no myalgias, joint pain, or weakness Hematologic/Oncology: Negative; no easy bruising, bleeding Endocrine: Negative; no heat/cold intolerance; no diabetes Neuro: Negative; no changes in balance, headaches Skin: Negative; No rashes or skin lesions Psychiatric: Negative; No behavioral problems, depression Sleep: Negative; No snoring, daytime sleepiness, hypersomnolence, bruxism, restless legs, hypnogognic hallucinations, no cataplexy Other comprehensive 14 point system review is negative.   PHYSICAL EXAM:   VS:  BP 128/72   Pulse (!) 57   Ht '5\' 9"'$  (1.753 m)   Wt 174 lb 6.4 oz (79.1 kg)   BMI 25.75 kg/m    Repeat blood pressure by me was 120/70  Wt Readings from Last 3 Encounters:  11/04/18 174 lb 6.4 oz (79.1 kg)  05/09/18 171 lb 6.4 oz (77.7 kg)  05/06/18 171 lb 3.2 oz (77.7 kg)    General: Alert, oriented, no distress.  Skin: normal turgor, no rashes, warm and dry HEENT: Normocephalic, atraumatic. Pupils equal round and reactive to light; sclera anicteric; extraocular muscles intact;  Nose without nasal septal hypertrophy Mouth/Parynx benign; Mallinpatti scale 3 Neck: No JVD, no carotid bruits; normal carotid upstroke Lungs: clear to ausculatation and percussion; no wheezing or rales Chest wall: without tenderness to  palpitation Heart: PMI not displaced, RRR, s1 s2 normal, 2/6 systolic murmur on the lower left sternal border and apex suggestive of mitral regurgitation, no diastolic murmur, no rubs,  gallops, thrills, or heaves Abdomen: soft, nontender; no hepatosplenomehaly, BS+; abdominal aorta nontender and not dilated by palpation.;  Very soft minimal abdominal bruit. Back: no CVA tenderness Pulses 2+ Musculoskeletal: full range of motion, normal strength, no joint deformities Extremities: no clubbing cyanosis or edema, Homan's sign negative  Neurologic: grossly nonfocal; Cranial nerves grossly wnl Psychologic: Normal mood and affect    Studies/Labs Reviewed:   EKG:  EKG is ordered today. ECG (independently read by me): Sinus bradycardia at 57 bpm.  Normal ECG.  No ectopy.  No ST segment changes.  PR interval 206 msec;QTc 412   July 2019 ECG (independently read by me): Sinius bradycardia 59 bpm.  No ST segment changes.  No ECG evidence for prior ACS.  December 2018 ECG (independently read by me): Normal sinus rhythm at 61 bpm.  No ST segment changes.  No ectopy.  Normal intervals.  June 2018 ECG (independently read by me): Sinus bradycardia 51 bpm.  No ST segment changes.  Normal intervals.  February 2018 ECG (independently read by me): Sinus bradycardia 53 bpm.  Normal ECG.  Early repolarization.  No evidence for prior infarction.  Recent Labs: BMP Latest Ref Rng & Units 10/28/2018 05/09/2018 10/03/2017  Glucose 65 - 99 mg/dL 96 88 91  BUN 8 - 27 mg/dL '20 18 16  '$ Creatinine 0.76 - 1.27 mg/dL 0.99 0.91 0.88  BUN/Creat Ratio 10 - '24 20 20 18  '$ Sodium 134 - 144 mmol/L 140 142 143  Potassium 3.5 - 5.2 mmol/L 4.2 4.6 4.4  Chloride 96 - 106 mmol/L 102 102 103  CO2 20 - 29 mmol/L '23 22 27  '$ Calcium 8.6 - 10.2 mg/dL 8.7 9.2 8.9     Hepatic Function Latest Ref Rng & Units 10/28/2018 05/09/2018 10/03/2017  Total Protein 6.0 - 8.5 g/dL 6.1 6.9 6.2  Albumin 3.5 - 4.8 g/dL 4.4 4.6 4.2  AST 0 - 40 IU/L '19  24 23  '$ ALT 0 - 44 IU/L '19 21 22  '$ Alk Phosphatase 39 - 117 IU/L 78 81 75  Total Bilirubin 0.0 - 1.2 mg/dL 0.7 0.5 0.8  Bilirubin, Direct <=0.2 mg/dL - - -    CBC Latest Ref Rng & Units 10/28/2018 05/09/2018 10/03/2017  WBC 3.4 - 10.8 x10E3/uL 4.2 4.9 3.5  Hemoglobin 13.0 - 17.7 g/dL 14.2 14.3 14.1  Hematocrit 37.5 - 51.0 % 41.2 42.4 40.6  Platelets 150 - 450 x10E3/uL 185 205 228   Lab Results  Component Value Date   MCV 95 10/28/2018   MCV 93 05/09/2018   MCV 95 10/03/2017   Lab Results  Component Value Date   TSH 1.130 10/28/2018   Lab Results  Component Value Date   HGBA1C 5.5 08/23/2016     BNP    Component Value Date/Time   BNP 164.0 (H) 08/23/2016 0200    ProBNP No results found for: PROBNP   Lipid Panel     Component Value Date/Time   CHOL 125 10/28/2018 0855   TRIG 53 10/28/2018 0855   HDL 65 10/28/2018 0855   CHOLHDL 1.9 10/28/2018 0855   CHOLHDL 2.3 10/10/2016 0951   VLDL 10 10/10/2016 0951   LDLCALC 49 10/28/2018 0855     RADIOLOGY: No results found.   ASSESSMENT:    No diagnosis found.   PLAN:  Mr Timbrook is a 78 year old Caucasian male who develeoped an ACS/Anterior STEMI with 11 mm ST elevation precordially on Halloween 2017. Fortunately he acted quickly and was prompty brought to [facility] cath lab  where I performed emergent cath/PCI .  He had an DTB time of 19 minutes from arrival to the lab and his chest pain duration was less than 60 minutes. Due to disease beyond the initial occlusion he underwent tandem DES stenting with an excellent result.  He has been demonstrated to have complete myocardial salvage and his ECG is normal.  His last echo Doppler study in May 2019 continue to show an EF of 60 to 65% without wall motion abnormalities.  He had trivial AR, mild MR, trivial TR and mild PR.  There was increased thickness of the interatrial septum consistent with lipomatous hypertrophy.  There was mild/moderate LA dilation.  Is not having any anginal  symptomatology.  His ECG remains entirely normal.  Blood pressure today is normal on lisinopril at just 2.5 mg. He is bradycardic with heart rates in the 50s and not on beta-blocker therapy.  He continues to be active there is no exertional dyspnea.  He continues to be on atorvastatin 80 mg for hyperlipidemia.  LDL cholesterol is excellent at 49.  I have kept him on dual antiplatelet therapy and he continues to be on aspirin 81 mg and low-dose Brilinta at 60 mg twice a day.  We discussed in the future potential changing to clopidogrel if cost becomes an issue.  His 2/6 systolic murmur is suggestive of mitral regurgitation which was found only to be mild on his last echo Doppler study.  Reviewed his CT of his abdomen.  Aortic atherosclerosis was noted.  He will continue his current regimen.  I will see him in 9 months for follow-up evaluation.   Medication Adjustments/Labs and Tests Ordered: Current medicines are reviewed at length with the patient today.  Concerns regarding medicines are outlined above.  Medication changes, Labs and Tests ordered today are listed in the Patient Instructions below.  There are no Patient Instructions on file for this visit.   Signed, Shelva Majestic, MD , [facility] 11/04/2018 8:30 AM    [facility] [address], [place], [place], [place]  [address] Phone: [phone]

## 2018-11-05 IMAGING — DX DG CHEST 2V
2 series · 2 of 2 positions shown · non-contrast
Comparison: 09/30/2016 CXR

CLINICAL DATA: Chest discomfort.  Myocardial infarction.

EXAM:
CHEST  2 VIEW

[chest pa]
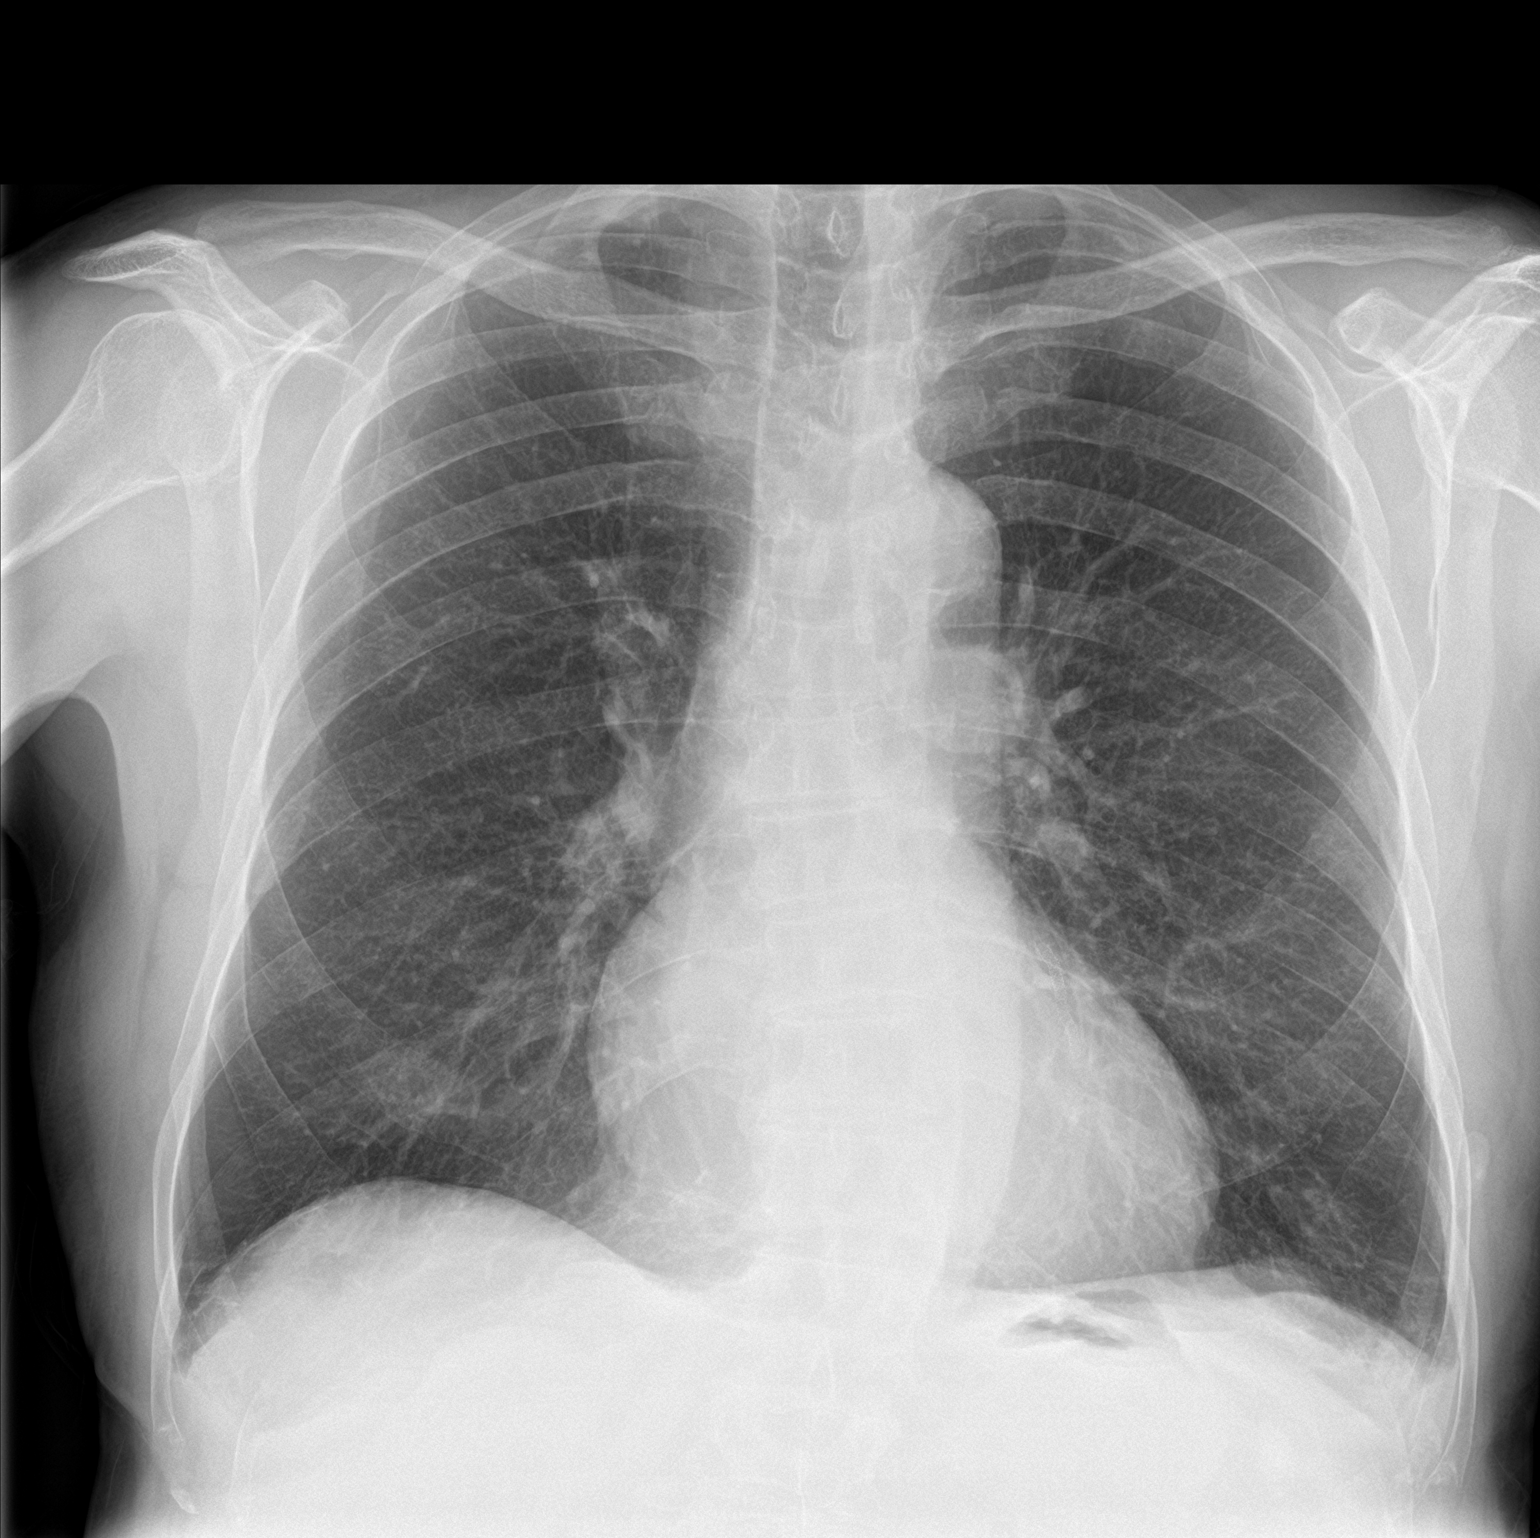

[chest lat]
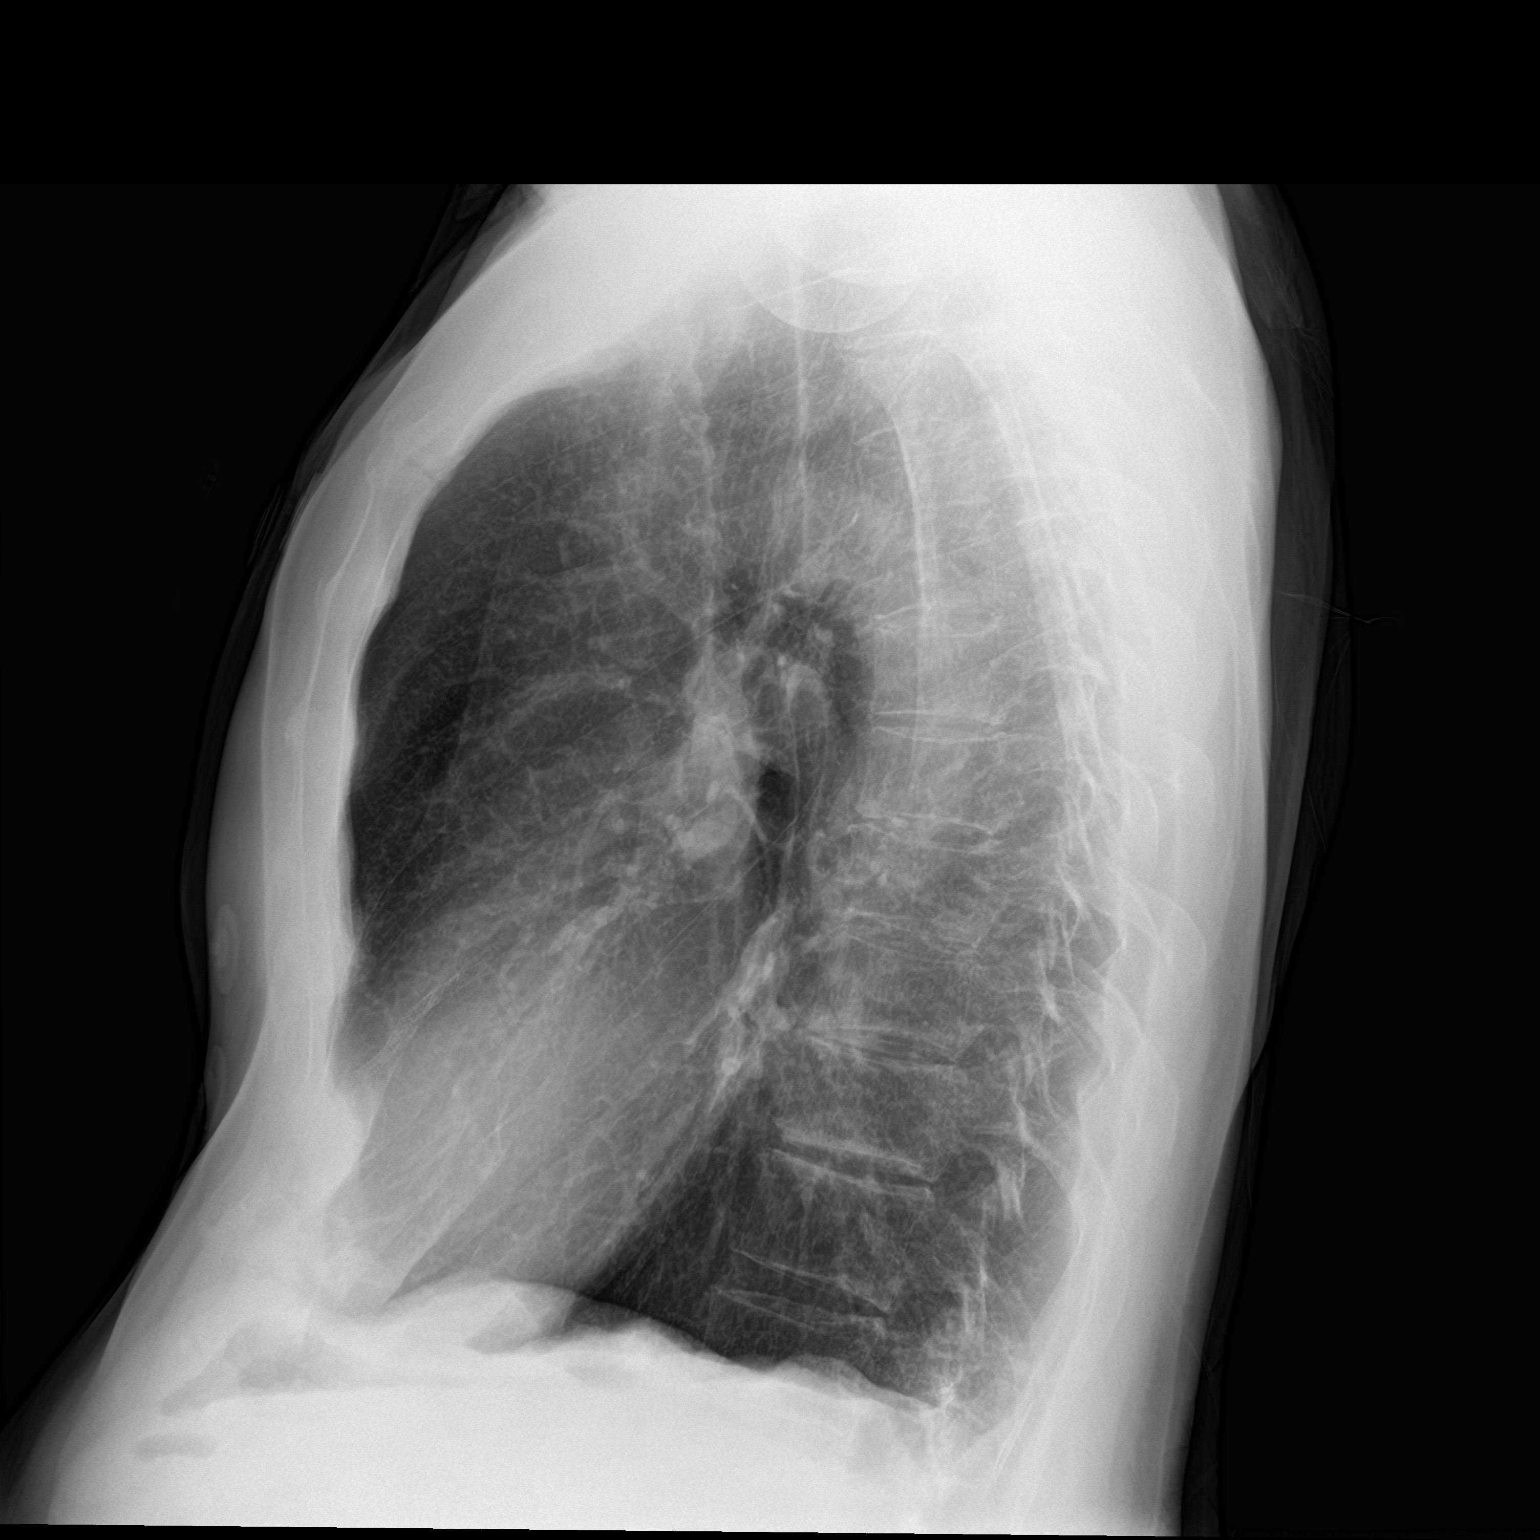

[2 of 2 positions shown; findings below may reference images not displayed]

FINDINGS: The heart size and mediastinal contours are within normal limits.
Aortic atherosclerosis at the arch without aneurysm. Minimal
atelectasis and/or scarring at the left lung base. No pulmonary
consolidation, effusion or pneumothorax. The visualized skeletal
structures are unremarkable.
IMPRESSION: No active cardiopulmonary disease. Minimal left basilar atelectasis
and/or scarring.

## 2019-02-06 ENCOUNTER — Telehealth: Payer: Self-pay | Admitting: Registered Nurse

## 2019-02-06 NOTE — Telephone Encounter (Signed)
I called Mr Pardo this morning to discuss his overdue AWV. In speaking with Mr. Vea, he expressed that he was not comfortable with the idea of having a videochat visit for his AWV and would rather call back to schedule an in-person visit post Covid-19. He was encouraged to call the clinic with any questions or concerns in the meantime.

## 2019-05-04 ENCOUNTER — Other Ambulatory Visit: Payer: Self-pay

## 2019-05-04 ENCOUNTER — Ambulatory Visit (HOSPITAL_COMMUNITY)
Admission: EM | Admit: 2019-05-04 | Discharge: 2019-05-04 | Disposition: A | Discharge status: Home / Self Care | Payer: Medicare Other | Attending: Emergency Medicine | Admitting: Emergency Medicine

## 2019-05-04 ENCOUNTER — Encounter (HOSPITAL_COMMUNITY): Payer: Self-pay

## 2019-05-04 DIAGNOSIS — K121 Other forms of stomatitis: Secondary | ICD-10-CM | POA: Diagnosis not present

## 2019-05-04 DIAGNOSIS — B9789 Other viral agents as the cause of diseases classified elsewhere: Secondary | ICD-10-CM

## 2019-05-04 MED ORDER — VALACYCLOVIR HCL 1 G PO TABS: 1000.0000 mg | ORAL_TABLET | Freq: Two times a day (BID) | ORAL | 0 refills | Status: AC

## 2019-05-04 MED ORDER — MAGIC MOUTHWASH W/LIDOCAINE: 5.0000 mL | Freq: Four times a day (QID) | ORAL | 0 refills | Status: DC | PRN

## 2019-05-04 NOTE — Discharge Instructions (Signed)
Begin Valtrex twice daily for the next 10 days May use Magic mouthwash solution before meals and bedtime to help with pain, swish/gargle and spit out Drink plenty of fluids  Please follow-up if symptoms persisting or worsening despite the use of of the above, developing fevers

## 2019-05-04 NOTE — ED Provider Notes (Signed)
Timothy Escobar    CSN: [id number] Arrival date & time: 05/04/19  1205      History   Chief Complaint Chief Complaint  Patient presents with   Mouth Lesions    HPI Timothy Escobar is a 78 y.o. male history of CAD, hyperlipidemia, presenting today for evaluation of mouth sores.  Patient states that over the past 4 days he has had progressively worsening painful lesions in his mouth.  States that it has become painful to swallow, drink and eat.  Notes that he has lesions within his lower lip, soft palate and underneath his tongue.  Has had 1 previous history of similar in the past which improved with antivirals and Magic mouthwash.  This episode is worse.  Denies history of diabetes, denies HIV.  Denies fevers.  Has tried topical analgesia without relief.  HPI  Past Medical History:  Diagnosis Date   Blood transfusion without reported diagnosis    CAD (coronary artery disease)    a. 08/22/2016 Ant STEMI/PCI: LM nl, LAD 30ost, 100p (3.0x15 Synergy DES & 2.75x24 Synergy DES), LCX large/dominant/nl, RCA nondom, nl;  b. 08/2016 Echo: Ef 60-65%, no rwma.   Depression    Dyslipidemia    Skin cancer    skin cancer    Patient Active Problem List   Diagnosis Date Noted   Bacterial conjunctivitis of both eyes 01/23/2018   Sinus bradycardia 01/23/2017   Acute bronchitis 11/21/2016   Dyslipidemia    CAD (coronary artery disease)    Status post coronary artery stent placement    Bradycardia    Hypokalemia    Hyperlipidemia with target LDL less than 70    STEMI (ST elevation myocardial infarction) ([place]) 08/22/2016   Acute ST elevation myocardial infarction (STEMI) involving left anterior descending (LAD) coronary artery ([place]) 08/22/2016   Hip pain 09/28/2012   Chronic depression 09/28/2012    Past Surgical History:  Procedure Laterality Date   APPENDECTOMY     CARDIAC CATHETERIZATION N/A 08/22/2016   Procedure: Left Heart Cath and Coronary  Angiography;  Surgeon: Troy Sine, MD;  Location: [facility];  Service: Cardiovascular;  Laterality: N/A;   CARDIAC CATHETERIZATION N/A 08/22/2016   Procedure: Coronary Stent Intervention;  Surgeon: Troy Sine, MD;  Location: [facility];  Service: Cardiovascular;  Laterality: N/A;  MID LAD   HERNIA REPAIR     parotid gland cyst removal Right 2012   vocal fold polyp removed         Home Medications    Prior to Admission medications   Medication Sig Start Date End Date Taking? Authorizing Provider  acetaminophen (TYLENOL) 500 MG tablet Take 500 mg by mouth every 6 (six) hours as needed for pain or headache.    [provider]  aspirin EC 81 MG EC tablet Take 1 tablet (81 mg total) by mouth daily. 08/26/16   Maryellen Pile, MD  atorvastatin (LIPITOR) 80 MG tablet TAKE 1 TABLET BY MOUTH ONCE DAILY AT 6 PM 11/04/18   Troy Sine, MD  calcium-vitamin D (OSCAL WITH D) 500-200 MG-UNIT per tablet Take 1 tablet by mouth daily. 1,000 of Vit D3    [provider]  cholecalciferol (VITAMIN D) 1000 units tablet Take 1,000 Units by mouth daily.    [provider]  clobetasol ointment (TEMOVATE) 5.05 % Apply 1 application topically 2 (two) times daily as needed (rash).  08/30/15   [provider]  FLUoxetine (PROZAC) 20 MG capsule TAKE 1  CAPSULE BY MOUTH ONCE DAILY 09/10/18   Wendie Agreste, MD  Ginger Root POWD by Does not apply route. Mixes 1/2 teaspoon of powder into hot tea once daily    [provider]  lisinopril (PRINIVIL,ZESTRIL) 2.5 MG tablet Take 1 tablet (2.5 mg total) by mouth daily. 11/04/18   Troy Sine, MD  magic mouthwash w/lidocaine SOLN Take 5 mLs by mouth 4 (four) times daily as needed for mouth pain. 05/04/19   Trachelle Low C, PA-C  Multiple Vitamins-Minerals (MULTIVITAMIN WITH MINERALS) tablet Take 1 tablet by mouth once a week. Centrum Silver     [provider]  nitroGLYCERIN (NITROSTAT) 0.4  MG SL tablet Place 1 tablet (0.4 mg total) under the tongue every 5 (five) minutes x 3 doses as needed for chest pain. 05/13/18   Troy Sine, MD  ticagrelor (BRILINTA) 60 MG TABS tablet Take 1 tablet (60 mg total) by mouth 2 (two) times daily. 11/04/18   Troy Sine, MD  valACYclovir (VALTREX) 1000 MG tablet Take 1 tablet (1,000 mg total) by mouth 2 (two) times daily for 10 days. 05/04/19 05/14/19  Chancy Smigiel C, PA-C  vitamin C (ASCORBIC ACID) 500 MG tablet Take 500 mg by mouth daily.    [provider]    Family History Family History  Problem Relation Age of Onset   Dementia Mother    Cancer Father        skin   Heart disease Maternal Grandmother    Stroke Maternal Grandmother    Cancer Sister        breast (pt did not specific L or R)   Cancer Brother        bladder and pancreatic   Heart disease Brother    Cancer Brother    Heart disease Sister    Cancer Sister    Hypertension Sister    Cancer Paternal Grandmother     Social History Social History   Tobacco Use   Smoking status: Former Smoker    Quit date: 01/22/1967    Years since quitting: 52.3   Smokeless tobacco: Never Used  Substance Use Topics   Alcohol use: Yes    Alcohol/week: 7.0 standard drinks    Types: 7 Standard drinks or equivalent per week    Comment: wine: 1 glass daily   Drug use: No     Allergies   Patient has no known allergies.   Review of Systems Review of Systems  Constitutional: Negative for fatigue and fever.  HENT: Positive for mouth sores and sore throat. Negative for trouble swallowing.   Eyes: Negative for redness, itching and visual disturbance.  Respiratory: Negative for shortness of breath.   Cardiovascular: Negative for chest pain and leg swelling.  Gastrointestinal: Negative for nausea and vomiting.  Musculoskeletal: Negative for arthralgias and myalgias.  Skin: Positive for color change. Negative for rash and wound.  Neurological: Negative  for dizziness, syncope, weakness, light-headedness and headaches.     Physical Exam Triage Vital Signs ED Triage Vitals  Enc Vitals Group     BP 05/04/19 1308 (!) 145/67     Pulse Rate 05/04/19 1308 (!) 56     Resp 05/04/19 1308 16     Temp 05/04/19 1308 98.5 F (36.9 C)     Temp Source 05/04/19 1308 Oral     SpO2 05/04/19 1308 99 %     Weight --      Height --      Head Circumference --  Peak Flow --      Pain Score 05/04/19 1309 7     Pain Loc --      Pain Edu? --      Excl. in [place]? --    No data found.  Updated Vital Signs BP (!) 145/67 (BP Location: Left Arm)    Pulse (!) 56    Temp 98.5 F (36.9 C) (Oral)    Resp 16    SpO2 99%   Visual Acuity Right Eye Distance:   Left Eye Distance:   Bilateral Distance:    Right Eye Near:   Left Eye Near:    Bilateral Near:     Physical Exam Vitals signs and nursing note reviewed.  Constitutional:      Appearance: He is well-developed.     Comments: No acute distress  HENT:     Head: Normocephalic and atraumatic.     Nose: Nose normal.     Mouth/Throat:     Comments: Oral mucosa with multiple clustered white plaques with surrounding erythema noted to lower lip vestibule, inferior surface of tongue buccal mucosa as well as soft palate and along uvula, no lesions noted on posterior pharynx, tonsils without swelling, no soft palate swelling Eyes:     Conjunctiva/sclera: Conjunctivae normal.  Neck:     Musculoskeletal: Neck supple.  Cardiovascular:     Rate and Rhythm: Normal rate.  Pulmonary:     Effort: Pulmonary effort is normal. No respiratory distress.  Abdominal:     General: There is no distension.  Musculoskeletal: Normal range of motion.  Skin:    General: Skin is warm and dry.  Neurological:     Mental Status: He is alert and oriented to person, place, and time.      UC Treatments / Results  Labs (all labs ordered are listed, but only abnormal results are displayed) Labs Reviewed - No data to  display  EKG   Radiology No results found.  Procedures Procedures (including critical care time)  Medications Ordered in UC Medications - No data to display  Initial Impression / Assessment and Plan / UC Course  I have reviewed the triage vital signs and the nursing notes.  Pertinent labs & imaging results that were available during my care of the patient were reviewed by me and considered in my medical decision making (see chart for details).     Patient with multiple aphthous ulcers throughout various aspects of oral mucosa.  Given age, past medical history will initiate on antivirals with Valtrex as well as prescribed Magic mouthwash for more symptomatic relief especially before meals.  Push fluids.  Follow-up if not resolving or worsening.Discussed strict return precautions. Patient verbalized understanding and is agreeable with plan.  Final Clinical Impressions(s) / UC Diagnoses   Final diagnoses:  Stomatitis, viral     Discharge Instructions     Begin Valtrex twice daily for the next 10 days May use Magic mouthwash solution before meals and bedtime to help with pain, swish/gargle and spit out Drink plenty of fluids  Please follow-up if symptoms persisting or worsening despite the use of of the above, developing fevers   ED Prescriptions    Medication Sig Dispense Auth. Provider   valACYclovir (VALTREX) 1000 MG tablet Take 1 tablet (1,000 mg total) by mouth 2 (two) times daily for 10 days. 20 tablet Keyshawna Prouse C, PA-C   magic mouthwash w/lidocaine SOLN Take 5 mLs by mouth 4 (four) times daily as needed for mouth pain. 100  mL Herma Uballe C, PA-C     Controlled Substance Prescriptions [place] Controlled Substance Registry consulted? Not Applicable   Janith Lima, [place] 05/04/19 2227

## 2019-05-04 NOTE — ED Triage Notes (Signed)
Pt C/o mouth sores, symptoms started a few days ago. Pt states it painful to swallow and eat.

## 2019-05-20 DIAGNOSIS — H0220C Unspecified lagophthalmos, bilateral, upper and lower eyelids: Secondary | ICD-10-CM | POA: Diagnosis not present

## 2019-05-20 DIAGNOSIS — H04123 Dry eye syndrome of bilateral lacrimal glands: Secondary | ICD-10-CM | POA: Diagnosis not present

## 2019-05-20 DIAGNOSIS — H25813 Combined forms of age-related cataract, bilateral: Secondary | ICD-10-CM | POA: Diagnosis not present

## 2019-05-20 DIAGNOSIS — H0102A Squamous blepharitis right eye, upper and lower eyelids: Secondary | ICD-10-CM | POA: Diagnosis not present

## 2019-05-20 DIAGNOSIS — H0102B Squamous blepharitis left eye, upper and lower eyelids: Secondary | ICD-10-CM | POA: Diagnosis not present

## 2019-06-02 ENCOUNTER — Encounter: Payer: Self-pay | Admitting: Family Medicine

## 2019-06-06 ENCOUNTER — Encounter: Payer: Self-pay | Admitting: Family Medicine

## 2019-06-12 ENCOUNTER — Encounter: Payer: Self-pay | Admitting: *Deleted

## 2019-07-24 DIAGNOSIS — C44329 Squamous cell carcinoma of skin of other parts of face: Secondary | ICD-10-CM | POA: Diagnosis not present

## 2019-07-24 DIAGNOSIS — D485 Neoplasm of uncertain behavior of skin: Secondary | ICD-10-CM | POA: Diagnosis not present

## 2019-07-24 DIAGNOSIS — L570 Actinic keratosis: Secondary | ICD-10-CM | POA: Diagnosis not present

## 2019-07-24 DIAGNOSIS — D1801 Hemangioma of skin and subcutaneous tissue: Secondary | ICD-10-CM | POA: Diagnosis not present

## 2019-07-24 DIAGNOSIS — L821 Other seborrheic keratosis: Secondary | ICD-10-CM | POA: Diagnosis not present

## 2019-07-24 DIAGNOSIS — L439 Lichen planus, unspecified: Secondary | ICD-10-CM | POA: Diagnosis not present

## 2019-07-24 DIAGNOSIS — D225 Melanocytic nevi of trunk: Secondary | ICD-10-CM | POA: Diagnosis not present

## 2019-07-25 ENCOUNTER — Other Ambulatory Visit: Payer: Self-pay

## 2019-07-25 ENCOUNTER — Ambulatory Visit (INDEPENDENT_AMBULATORY_CARE_PROVIDER_SITE_OTHER): Payer: Medicare Other | Admitting: Registered Nurse

## 2019-07-25 DIAGNOSIS — Z23 Encounter for immunization: Secondary | ICD-10-CM | POA: Diagnosis not present

## 2019-07-25 NOTE — Patient Instructions (Signed)
Injection given, pt tolerated well.

## 2019-08-05 ENCOUNTER — Telehealth: Payer: Self-pay | Admitting: *Deleted

## 2019-08-05 NOTE — Telephone Encounter (Signed)
Schedule AWV.  

## 2019-08-07 ENCOUNTER — Ambulatory Visit (INDEPENDENT_AMBULATORY_CARE_PROVIDER_SITE_OTHER): Payer: Medicare Other | Admitting: Family Medicine

## 2019-08-07 VITALS — BP 128/72 | Ht 68.0 in | Wt 165.0 lb

## 2019-08-07 DIAGNOSIS — Z0000 Encounter for general adult medical examination without abnormal findings: Secondary | ICD-10-CM

## 2019-08-07 NOTE — Progress Notes (Addendum)
Presents today for [employer] Visit   Date of last exam:  05/09/2018  Interpreter used for this visit?   No  I connected with  Timothy Escobar on 08/07/19 by a telephone  application and verified that I am speaking with the correct person using two identifiers.   I discussed the limitations of evaluation and management by telemedicine. The patient expressed understanding and agreed to proceed.   Patient Care Team: Wendie Agreste, MD as PCP - General (Family Medicine) Troy Sine, MD as PCP - Cardiology (Cardiology) Leta Baptist, MD as Consulting Physician (Otolaryngology)   Other items to address today:  Discussed Eye/Dental Discussed immunizations Copy of Colonoscopy requested from [place]  Will Call to schedule appointment   Other Screening:  Last lipid screening:10/28/2018   ADVANCE DIRECTIVES: Discussed: yes On File:  No (copy requested) Materials Provided: no  Immunization status:  Immunization History  Administered Date(s) Administered  . Fluad Quad(high Dose 65+) 07/25/2019  . Influenza,inj,Quad PF,6+ Mos 07/18/2013, 07/29/2014, 08/30/2015, 07/20/2016, 08/01/2017, 08/21/2018  . Influenza-Unspecified 07/23/2012  . Pneumococcal Conjugate-13 12/18/2017  . Pneumococcal Polysaccharide-23 05/13/2008  . Td 05/13/2008  . Tdap 09/26/2012  . Zoster 10/23/2009     There are no preventive care reminders to display for this patient.   Functional Status Survey: Is the patient deaf or have difficulty hearing?: No(two years ago lack of processing things hear) Does the patient have difficulty seeing, even when wearing glasses/contacts?: No Does the patient have difficulty concentrating, remembering, or making decisions?: No Does the patient have difficulty walking or climbing stairs?: No Does the patient have difficulty dressing or bathing?: No Does the patient have difficulty doing errands alone such as visiting a doctor's office or shopping?:  No   6CIT Screen 08/07/2019 12/18/2017  What Year? 0 points 0 points  What month? 0 points 0 points  What time? 0 points 0 points  Count back from 20 0 points 0 points  Months in reverse 0 points 0 points  Repeat phrase 2 points 0 points  Total Score 2 0        Clinical Support from 08/07/2019 in [facility] at [facility]  AUDIT-C Score  5       Home Environment:   Lives in one story No trouble climbing stairs Yes scattered rugs  With grippers  No grab bars Adequate lighting / no clutter    Patient Active Problem List   Diagnosis Date Noted  . Bacterial conjunctivitis of both eyes 01/23/2018  . Sinus bradycardia 01/23/2017  . Acute bronchitis 11/21/2016  . Dyslipidemia   . CAD (coronary artery disease)   . Status post coronary artery stent placement   . Bradycardia   . Hypokalemia   . Hyperlipidemia with target LDL less than 70   . STEMI (ST elevation myocardial infarction) ([place]) 08/22/2016  . Acute ST elevation myocardial infarction (STEMI) involving left anterior descending (LAD) coronary artery ([place]) 08/22/2016  . Hip pain 09/28/2012  . Chronic depression 09/28/2012     Past Medical History:  Diagnosis Date  . Blood transfusion without reported diagnosis   . CAD (coronary artery disease)    a. 08/22/2016 Ant STEMI/PCI: LM nl, LAD 30ost, 100p (3.0x15 Synergy DES & 2.75x24 Synergy DES), LCX large/dominant/nl, RCA nondom, nl;  b. 08/2016 Echo: Ef 60-65%, no rwma.  . Depression   . Dyslipidemia   . Skin cancer    skin cancer     Past Surgical History:  Procedure Laterality  Date  . APPENDECTOMY    . CARDIAC CATHETERIZATION N/A 08/22/2016   Procedure: Left Heart Cath and Coronary Angiography;  Surgeon: Troy Sine, MD;  Location: [facility];  Service: Cardiovascular;  Laterality: N/A;  . CARDIAC CATHETERIZATION N/A 08/22/2016   Procedure: Coronary Stent Intervention;  Surgeon: Troy Sine, MD;  Location: [facility];  Service:  Cardiovascular;  Laterality: N/A;  MID LAD  . HERNIA REPAIR    . parotid gland cyst removal Right 2012  . vocal fold polyp removed       Family History  Problem Relation Age of Onset  . Dementia Mother   . Cancer Father        skin  . Heart disease Maternal Grandmother   . Stroke Maternal Grandmother   . Cancer Sister        breast (pt did not specific L or R)  . Cancer Brother        bladder and pancreatic  . Heart disease Brother   . Cancer Brother   . Heart disease Sister   . Cancer Sister   . Hypertension Sister   . Cancer Paternal Grandmother      Social History   Socioeconomic History  . Marital status: Married    Spouse name: Not on file  . Number of children: 2  . Years of education: college  . Highest education level: Not on file  Occupational History  . Occupation: Retired - Water quality scientist  Social Needs  . Financial resource strain: Not on file  . Food insecurity    Worry: Not on file    Inability: Not on file  . Transportation needs    Medical: Not on file    Non-medical: Not on file  Tobacco Use  . Smoking status: Former Smoker    Quit date: 01/22/1967    Years since quitting: 52.5  . Smokeless tobacco: Never Used  Substance and Sexual Activity  . Alcohol use: Yes    Alcohol/week: 7.0 standard drinks    Types: 7 Standard drinks or equivalent per week    Comment: wine: 1 glass daily  . Drug use: No  . Sexual activity: Never  Lifestyle  . Physical activity    Days per week: Not on file    Minutes per session: Not on file  . Stress: Not on file  Relationships  . Social Herbalist on phone: Not on file    Gets together: Not on file    Attends religious service: Not on file    Active member of club or organization: Not on file    Attends meetings of clubs or organizations: Not on file    Relationship status: Not on file  . Intimate partner violence    Fear of current or ex partner: Not on file    Emotionally abused: Not on file     Physically abused: Not on file    Forced sexual activity: Not on file  Other Topics Concern  . Not on file  Social History Narrative   Lives w/ wife in [place]. 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, plus the church choir.   Exercise walk/gardening daily for 1 hr plus     No Known Allergies   Prior to Admission medications   Medication Sig Start Date End Date Taking? Authorizing Provider  acetaminophen (TYLENOL) 500 MG tablet Take 500 mg by mouth every 6 (  six) hours as needed for pain or headache.   Yes [provider]  aspirin EC 81 MG EC tablet Take 1 tablet (81 mg total) by mouth daily. 08/26/16  Yes Maryellen Pile, MD  atorvastatin (LIPITOR) 80 MG tablet TAKE 1 TABLET BY MOUTH ONCE DAILY AT 6 PM 11/04/18  Yes Troy Sine, MD  calcium-vitamin D (OSCAL WITH D) 500-200 MG-UNIT per tablet Take 1 tablet by mouth daily. 1,000 of Vit D3   Yes [provider]  cholecalciferol (VITAMIN D) 1000 units tablet Take 1,000 Units by mouth daily.   Yes [provider]  FLUoxetine (PROZAC) 20 MG capsule TAKE 1 CAPSULE BY MOUTH ONCE DAILY 09/10/18  Yes Wendie Agreste, MD  Ginger Root POWD by Does not apply route. Mixes 1/2 teaspoon of powder into hot tea once daily   Yes [provider]  lisinopril (PRINIVIL,ZESTRIL) 2.5 MG tablet Take 1 tablet (2.5 mg total) by mouth daily. 11/04/18  Yes Troy Sine, MD  Multiple Vitamins-Minerals (MULTIVITAMIN WITH MINERALS) tablet Take 1 tablet by mouth once a week. Centrum Silver    Yes [provider]  nitroGLYCERIN (NITROSTAT) 0.4 MG SL tablet Place 1 tablet (0.4 mg total) under the tongue every 5 (five) minutes x 3 doses as needed for chest pain. 05/13/18  Yes Troy Sine, MD  ticagrelor (BRILINTA) 60 MG TABS tablet Take 1 tablet (60 mg total) by mouth 2 (two) times daily. 11/04/18  Yes Troy Sine, MD  vitamin C (ASCORBIC ACID) 500 MG tablet Take 500  mg by mouth daily.   Yes [provider]  clobetasol ointment (TEMOVATE) [id number] % Apply 1 application topically 2 (two) times daily as needed (rash).  08/30/15   [provider]  magic mouthwash w/lidocaine SOLN Take 5 mLs by mouth 4 (four) times daily as needed for mouth pain. Patient not taking: Reported on 08/07/2019 05/04/19   Janith Lima, [place]     Depression screen [facility] 2/9 08/07/2019 05/06/2018 04/29/2018 01/30/2018 01/23/2018  Decreased Interest 0 0 0 0 0  Down, Depressed, Hopeless 0 0 0 0 0  PHQ - 2 Score 0 0 0 0 0     Fall Risk  08/07/2019 05/06/2018 04/29/2018 01/30/2018 01/23/2018  Falls in the past year? 0 No No No No  Number falls in past yr: 0 - - - -  Injury with Fall? 0 - - - -  Follow up Falls evaluation completed;Education provided;Falls prevention discussed - - - -      PHYSICAL EXAM: BP 128/72 Comment: taken from previous visit  Ht 5\' 8"  (1.727 m)   Wt 165 lb (74.8 kg) Comment: per patient  BMI 25.09 kg/m    Wt Readings from Last 3 Encounters:  08/07/19 165 lb (74.8 kg)  11/04/18 174 lb 6.4 oz (79.1 kg)  05/09/18 171 lb 6.4 oz (77.7 kg)  Medicare annual wellness visit, subsequent   Education/Counseling provided regarding diet and exercise, prevention of chronic diseases, smoking/tobacco cessation, if applicable, and reviewed "Covered Medicare Preventive Services."

## 2019-08-07 NOTE — Patient Instructions (Signed)
Thank you for taking time to come for your Medicare Wellness Visit. I appreciate your ongoing commitment to your health goals. Please review the following plan we discussed and let me know if I can assist you in the future.  Timothy Kennedy LPN  Preventive Care 76 Years and Older, Male Preventive care refers to lifestyle choices and visits with your health care provider that can promote health and wellness. This includes:  A yearly physical exam. This is also called an annual well check.  Regular dental and eye exams.  Immunizations.  Screening for certain conditions.  Healthy lifestyle choices, such as diet and exercise. What can I expect for my preventive care visit? Physical exam Your health care provider will check:  Height and weight. These may be used to calculate body mass index (BMI), which is a measurement that tells if you are at a healthy weight.  Heart rate and blood pressure.  Your skin for abnormal spots. Counseling Your health care provider may ask you questions about:  Alcohol, tobacco, and drug use.  Emotional well-being.  Home and relationship well-being.  Sexual activity.  Eating habits.  History of falls.  Memory and ability to understand (cognition).  Work and work Statistician. What immunizations do I need?  Influenza (flu) vaccine  This is recommended every year. Tetanus, diphtheria, and pertussis (Tdap) vaccine  You may need a Td booster every 10 years. Varicella (chickenpox) vaccine  You may need this vaccine if you have not already been vaccinated. Zoster (shingles) vaccine  You may need this after age 76. Pneumococcal conjugate (PCV13) vaccine  One dose is recommended after age 76. Pneumococcal polysaccharide (PPSV23) vaccine  One dose is recommended after age 76. Measles, mumps, and rubella (MMR) vaccine  You may need at least one dose of MMR if you were born in 1957 or later. You may also need a second dose. Meningococcal  conjugate (MenACWY) vaccine  You may need this if you have certain conditions. Hepatitis A vaccine  You may need this if you have certain conditions or if you travel or work in places where you may be exposed to hepatitis A. Hepatitis B vaccine  You may need this if you have certain conditions or if you travel or work in places where you may be exposed to hepatitis B. Haemophilus influenzae type b (Hib) vaccine  You may need this if you have certain conditions. You may receive vaccines as individual doses or as more than one vaccine together in one shot (combination vaccines). Talk with your health care provider about the risks and benefits of combination vaccines. What tests do I need? Blood tests  Lipid and cholesterol levels. These may be checked every 5 years, or more frequently depending on your overall health.  Hepatitis C test.  Hepatitis B test. Screening  Lung cancer screening. You may have this screening every year starting at age 76 if you have a 30-pack-year history of smoking and currently smoke or have quit within the past 15 years.  Colorectal cancer screening. All adults should have this screening starting at age 76 and continuing until age 61. Your health care provider may recommend screening at age 57 if you are at increased risk. You will have tests every 1-10 years, depending on your results and the type of screening test.  Prostate cancer screening. Recommendations will vary depending on your family history and other risks.  Diabetes screening. This is done by checking your blood sugar (glucose) after you have not eaten for  a while (fasting). You may have this done every 1-3 years.  Abdominal aortic aneurysm (AAA) screening. You may need this if you are a current or former smoker.  Sexually transmitted disease (STD) testing. Follow these instructions at home: Eating and drinking  Eat a diet that includes fresh fruits and vegetables, whole grains, lean  protein, and low-fat dairy products. Limit your intake of foods with high amounts of sugar, saturated fats, and salt.  Take vitamin and mineral supplements as recommended by your health care provider.  Do not drink alcohol if your health care provider tells you not to drink.  If you drink alcohol: ? Limit how much you have to 0-2 drinks a day. ? Be aware of how much alcohol is in your drink. In the [place] one drink equals one 12 oz bottle of beer (355 mL), one 5 oz glass of wine (148 mL), or one 1 oz glass of hard liquor (44 mL). Lifestyle  Take daily care of your teeth and gums.  Stay active. Exercise for at least 30 minutes on 5 or more days each week.  Do not use any products that contain nicotine or tobacco, such as cigarettes, e-cigarettes, and chewing tobacco. If you need help quitting, ask your health care provider.  If you are sexually active, practice safe sex. Use a condom or other form of protection to prevent STIs (sexually transmitted infections).  Talk with your health care provider about taking a low-dose aspirin or statin. What's next?  Visit your health care provider once a year for a well check visit.  Ask your health care provider how often you should have your eyes and teeth checked.  Stay up to date on all vaccines. This information is not intended to replace advice given to you by your health care provider. Make sure you discuss any questions you have with your health care provider. Document Released: 11/05/2015 Document Revised: 10/03/2018 Document Reviewed: 10/03/2018 Elsevier Patient Education  2020 [employer].

## 2019-08-18 ENCOUNTER — Other Ambulatory Visit: Payer: Self-pay | Admitting: Cardiovascular Disease

## 2019-08-20 DIAGNOSIS — D0439 Carcinoma in situ of skin of other parts of face: Secondary | ICD-10-CM | POA: Diagnosis not present

## 2019-09-08 ENCOUNTER — Ambulatory Visit: Payer: Medicare Other | Admitting: Cardiovascular Disease

## 2019-09-08 ENCOUNTER — Other Ambulatory Visit: Payer: Self-pay

## 2019-09-08 ENCOUNTER — Encounter: Payer: Self-pay | Admitting: Cardiovascular Disease

## 2019-09-08 DIAGNOSIS — I2102 ST elevation (STEMI) myocardial infarction involving left anterior descending coronary artery: Secondary | ICD-10-CM | POA: Diagnosis not present

## 2019-09-08 DIAGNOSIS — I700 Atherosclerosis of aorta: Secondary | ICD-10-CM | POA: Diagnosis not present

## 2019-09-08 DIAGNOSIS — Z79899 Other long term (current) drug therapy: Secondary | ICD-10-CM | POA: Diagnosis not present

## 2019-09-08 DIAGNOSIS — E785 Hyperlipidemia, unspecified: Secondary | ICD-10-CM

## 2019-09-08 DIAGNOSIS — I10 Essential (primary) hypertension: Secondary | ICD-10-CM | POA: Diagnosis not present

## 2019-09-08 NOTE — Patient Instructions (Signed)
Labwork: FASTING LIPID, CMET, TSH, CBC HERE IN OUR OFFICE AT [facility]   You will need to fast. DO NOT EAT OR DRINK PAST MIDNIGHT.       If you have labs (blood work) drawn today and your tests are completely normal, you will receive your results only by: Marland Kitchen MyChart Message (if you have MyChart) OR . A paper copy in the mail If you have any lab test that is abnormal or we need to change your treatment, we will call you to review the results.  Follow-Up: IN 12 months Please call our office 2 months in advance, SEPT 2021 to schedule this NOV 2021 appointment. In Person Shelva Majestic, MD.    At [facility], you and your health needs are our priority.  As part of our continuing mission to provide you with exceptional heart care, we have created designated Provider Care Teams.  These Care Teams include your primary Cardiologist (physician) and Advanced Practice Providers (APPs -  Physician Assistants and Nurse Practitioners) who all work together to provide you with the care you need, when you need it.  Thank you for choosing [facility] at [facility]!!

## 2019-09-08 NOTE — Progress Notes (Signed)
Cardiology Office Note    Date:  09/10/2019   ID:  [name], Study [birth year], MRN [id number]  PCP:  Wendie Agreste, MD  Cardiologist:  Shelva Majestic, MD    History of Present Illness:  Timothy Escobar is a 78 y.o. male who sufferred an Anterior STEMI on 08/22/16.  I last saw him in October 2019.  He presents for a 13 month follow-up evaluation.   Timothy Escobar is a 78 year old Caucasian male who was without cardiac history and developed acute onset of severe substernal chest pressure while he was moving logs in a wheelbarrow which commenced at approximately 5 PM on 08/22/16. EMS arrived within 15 minutes. His ECG in transient showed 11 mm of ST segment elevation in leads V3 through V6 consistent with an acute anterior injury current. A code STEMI was activated and he was brought emergently directly to the catheterization laboratory where I performed emergent catheterization. Catheterization revealed total occlusion of a large proximal LAD with TIMI 0 flow and a large dominant normal left circumflex vessel and small nondominant RCA. He underwent successful PCI to the LAD, treated with PTCA/DES stenting at the site of 100% occlusion with initial insertion of a 3.016 mm Synergy DES stent.  Once  TIMI-3 flow was established it was evident that there was segmental disease beyond the stented segment of 70 and 80% and tandem stenting with a 2.7524 mm Synergy DES stent was done with post stent dilatation from 3.23 mm in the proximal portion of the proximal stent to 3.05 mm at the distal portion of the distal stent. LVEDP was 13 mm Hg and the door to balloon time from arrival was 19 minutes. DTB from chest pain onset prior to EMS arrival was < 1 hour.  He ultimately completely resolved his 11 mm ST elevation, ECG normalized, and subsequent echo showed completely normal LV fx with EF 60-65% without any wall motion abnormality! He was discharged 2 days later.  Since his event he has remained  asymptomatic.  He completed participation in cardiac rehabilitation.  He is participating in cardiac rehab.  When I last saw him, he was remaining completely asymptomatic.  Over the past 6 months.  He continues to be asymptomatic.  He is active.  He splits wood.  He continues to sing in the [facility] [employer] choir.  Laboratory on October 03, 2017 showed a total cholesterol 119, HDL 60, LDL 49, triglycerides 51.  Hemoglobin A1c was 5.5.  Creatine 0.88.  TSH 1.05.  Normal LFTs.   I last saw him in July 2019 at which time he was continuing to be asymptomatic from a cardiovascular standpoint.  He remains active cutting his grass, cutting firewood, and walking.  He continues to sing in the [employer] choir.  H underwent  an echo Doppler study on Mar 05, 2018.  EF was 60 to 65% and he had normal diastolic parameters.  There was trivial AR, mild MR, trivial TR, and mild PR.    He underwent evaluation by Dr. Cindee Lame for right upper quadrant abdominal stabbing pain.  CT of his abdomen on July 19, 2018 revealed evidence for very mild fatty infiltration of the liver.  His pancreas was unremarkable.  He was found to have a small accessory spleen.  There was mild tension of the right renal collecting system of uncertain significance.  There was no stone or obstructing mass.  Aortic atherosclerosis was present.  He did not have any adenopathy.  He  continues to be very active.  He continued to sing for the [facility] choir.  Recent laboratory on October 28, 2018 continue to show excellent lipid studies with total cholesterol 125, HDL 65, LDL 49, and triglycerides 53.  He denied chest pain, PND, orthopnea or  Palpitations.  Over the past year [name] has continued to do well.  Unfortunately with the COVID-19 pandemic, he has been unable to perform.  However he is practicing singing daily.  He remains active on his property and denies any exertional shortness of breath or  chest pain.  His blood pressure at home typically runs in the 120s over 70s.  He is walking at least 2 miles 4 days/week.  He has not had recent laboratory.  He presents for evaluation.  Past Medical History:  Diagnosis Date  . Blood transfusion without reported diagnosis   . CAD (coronary artery disease)    a. 08/22/2016 Ant STEMI/PCI: LM nl, LAD 30ost, 100p (3.0x15 Synergy DES & 2.75x24 Synergy DES), LCX large/dominant/nl, RCA nondom, nl;  b. 08/2016 Echo: Ef 60-65%, no rwma.  . Depression   . Dyslipidemia   . Skin cancer    skin cancer    Past Surgical History:  Procedure Laterality Date  . APPENDECTOMY    . CARDIAC CATHETERIZATION N/A 08/22/2016   Procedure: Left Heart Cath and Coronary Angiography;  Surgeon: Troy Sine, MD;  Location: [place] [facility] LAB;  Service: Cardiovascular;  Laterality: N/A;  . CARDIAC CATHETERIZATION N/A 08/22/2016   Procedure: Coronary Stent Intervention;  Surgeon: Troy Sine, MD;  Location: [place] [facility];  Service: Cardiovascular;  Laterality: N/A;  MID LAD  . HERNIA REPAIR    . parotid gland cyst removal Right 2012  . vocal fold polyp removed      Current Medications: Outpatient Medications Prior to Visit  Medication Sig Dispense Refill  . aspirin EC 81 MG EC tablet Take 1 tablet (81 mg total) by mouth daily. 90 tablet 2  . atorvastatin (LIPITOR) 80 MG tablet TAKE 1 TABLET BY MOUTH ONCE DAILY AT 6 PM 90 tablet 3  . calcium-vitamin D (OSCAL WITH D) 500-200 MG-UNIT per tablet Take 1 tablet by mouth daily. 1,000 of Vit D3    . cholecalciferol (VITAMIN D) 1000 units tablet Take 1,000 Units by mouth daily.    . Ginger Root POWD by Does not apply route. Mixes 1/2 teaspoon of powder into hot tea once daily    . lisinopril (ZESTRIL) 2.5 MG tablet Take 1 tablet by mouth once daily 90 tablet 0  . Multiple Vitamins-Minerals (MULTIVITAMIN WITH MINERALS) tablet Take 1 tablet by mouth once a week. Centrum Silver     . nitroGLYCERIN (NITROSTAT) 0.4 MG  SL tablet Place 1 tablet (0.4 mg total) under the tongue every 5 (five) minutes x 3 doses as needed for chest pain. 30 tablet 6  . ticagrelor (BRILINTA) 60 MG TABS tablet Take 1 tablet (60 mg total) by mouth 2 (two) times daily. 60 tablet 12  . vitamin C (ASCORBIC ACID) 500 MG tablet Take 500 mg by mouth daily.    Marland Kitchen FLUoxetine (PROZAC) 20 MG capsule TAKE 1 CAPSULE BY MOUTH ONCE DAILY 90 capsule 3  . acetaminophen (TYLENOL) 500 MG tablet Take 500 mg by mouth every 6 (six) hours as needed for pain or headache.    . clobetasol ointment (TEMOVATE) 0.27 % Apply 1 application topically 2 (two) times daily as needed (rash).     . magic mouthwash  w/lidocaine SOLN Take 5 mLs by mouth 4 (four) times daily as needed for mouth pain. (Patient not taking: Reported on 08/07/2019) 100 mL 0   No facility-administered medications prior to visit.      Allergies:   Patient has no known allergies.   Social History   Socioeconomic History  . Marital status: Married    Spouse name: Not on file  . Number of children: 2  . Years of education: college  . Highest education level: Not on file  Occupational History  . Occupation: Retired - Water quality scientist  Social Needs  . Financial resource strain: Not on file  . Food insecurity    Worry: Not on file    Inability: Not on file  . Transportation needs    Medical: Not on file    Non-medical: Not on file  Tobacco Use  . Smoking status: Former Smoker    Quit date: 01/22/1967    Years since quitting: 52.6  . Smokeless tobacco: Never Used  Substance and Sexual Activity  . Alcohol use: Yes    Alcohol/week: 7.0 standard drinks    Types: 7 Standard drinks or equivalent per week    Comment: wine: 1 glass daily  . Drug use: No  . Sexual activity: Never  Lifestyle  . Physical activity    Days per week: Not on file    Minutes per session: Not on file  . Stress: Not on file  Relationships  . Social Herbalist on phone: Not on file    Gets together:  Not on file    Attends religious service: Not on file    Active member of club or organization: Not on file    Attends meetings of clubs or organizations: Not on file    Relationship status: Not on file  Other Topics Concern  . Not on file  Social History Narrative   Lives w/ wife in [place]. Both are singers, they sing with the [employer] in [place] and with [employer], plus the church choir.   Exercise walk/gardening daily for 1 hr plus     Family History:  The patient's family history includes Cancer in his brother, brother, father, paternal grandmother, sister, and sister; Dementia in his mother; Heart disease in his brother, maternal grandmother, and sister; Hypertension in his sister; Stroke in his maternal grandmother.   ROS General: Negative; No fevers, chills, or night sweats;  HEENT: Negative; No changes in vision or hearing, sinus congestion, difficulty swallowing Pulmonary: Negative; No cough, wheezing, shortness of breath, hemoptysis Cardiovascular: Negative; No chest pain, presyncope, syncope, palpitations GI: resolved mild right upper quadrant tenderness. GU: Negative; No dysuria, hematuria, or difficulty voiding Musculoskeletal: Negative; no myalgias, joint pain, or weakness Hematologic/Oncology: Negative; no easy bruising, bleeding Endocrine: Negative; no heat/cold intolerance; no diabetes Neuro: Negative; no changes in balance, headaches Skin: Negative; No rashes or skin lesions Psychiatric: Negative; No behavioral problems, depression Sleep: Negative; No snoring, daytime sleepiness, hypersomnolence, bruxism, restless legs, hypnogognic hallucinations, no cataplexy Other comprehensive 14 point system review is negative.   PHYSICAL EXAM:   VS:  BP (!) 145/77   Pulse (!) 52   Temp (!) 97 F (36.1 C)   Ht '5\' 9"'$  (1.753 m)   Wt 172 lb 12.8 oz (78.4 kg)   BMI 25.52 kg/m    Repeat blood pressure by me 132/70  Wt Readings from Last 3 Encounters:   09/08/19 172 lb 12.8 oz (78.4 kg)  08/07/19 165 lb (  74.8 kg)  11/04/18 174 lb 6.4 oz (79.1 kg)    General: Alert, oriented, no distress.  Skin: normal turgor, no rashes, warm and dry HEENT: Normocephalic, atraumatic. Pupils equal round and reactive to light; sclera anicteric; extraocular muscles intact;  Nose without nasal septal hypertrophy Mouth/Parynx benign; Mallinpatti scale 2 Neck: No JVD, no carotid bruits; normal carotid upstroke Lungs: clear to ausculatation and percussion; no wheezing or rales Chest wall: Mild pectus excavatum chest deformity, without tenderness to palpitation Heart: PMI not displaced, RRR, s1 s2 normal, 2/6 systolic murmur, no diastolic murmur, no rubs, gallops, thrills, or heaves Abdomen: soft minimal abdominal bruit, nontender; no hepatosplenomehaly, BS+; abdominal aorta nontender and not dilated by palpation. Back: no CVA tenderness Pulses 2+ Musculoskeletal: full range of motion, normal strength, no joint deformities Extremities: no clubbing cyanosis or edema, Homan's sign negative  Neurologic: grossly nonfocal; Cranial nerves grossly wnl Psychologic: Normal mood and affect   Studies/Labs Reviewed:   EKG:  EKG is ordered today. Sinus Bradycardia at 52; Mild early repolarization changes; PR 204 msec  ECG (independently read by me): Sinus bradycardia at 57 bpm.  Normal ECG.  No ectopy.  No ST segment changes.  PR interval 206 msec;QTc 412   July 2019 ECG (independently read by me): Sinius bradycardia 59 bpm.  No ST segment changes.  No ECG evidence for prior ACS.  December 2018 ECG (independently read by me): Normal sinus rhythm at 61 bpm.  No ST segment changes.  No ectopy.  Normal intervals.  June 2018 ECG (independently read by me): Sinus bradycardia 51 bpm.  No ST segment changes.  Normal intervals.  February 2018 ECG (independently read by me): Sinus bradycardia 53 bpm.  Normal ECG.  Early repolarization.  No evidence for prior infarction.   Recent Labs: BMP 09/10/2019 10/28/2018 05/09/2018  Glucose WILL FOLLOW 96 88  BUN WILL FOLLOW 20 18  Creatinine WILL FOLLOW 0.99 0.91  BUN/Creat Ratio WILL FOLLOW 20 20  Sodium WILL FOLLOW 140 142  Potassium WILL FOLLOW 4.2 4.6  Chloride WILL FOLLOW 102 102  CO2 WILL FOLLOW 23 22  Calcium WILL FOLLOW 8.7 9.2     Hepatic Function Latest Ref Rng & Units 09/10/2019 10/28/2018 05/09/2018  Total Protein - WILL FOLLOW 6.1 6.9  Albumin - WILL FOLLOW 4.4 4.6  AST - WILL FOLLOW 19 24  ALT - WILL FOLLOW 19 21  Alk Phosphatase - WILL FOLLOW 78 81  Total Bilirubin - WILL FOLLOW 0.7 0.5  Bilirubin, Direct <=0.2 mg/dL - - -    CBC Latest Ref Rng & Units 09/10/2019 10/28/2018 05/09/2018  WBC 3.4 - 10.8 x10E3/uL 5.0 4.2 4.9  Hemoglobin 13.0 - 17.7 g/dL 14.5 14.2 14.3  Hematocrit 37.5 - 51.0 % 42.8 41.2 42.4  Platelets 150 - 450 x10E3/uL 192 185 205   Lab Results  Component Value Date   MCV 95 09/10/2019   MCV 95 10/28/2018   MCV 93 05/09/2018   Lab Results  Component Value Date   TSH WILL FOLLOW 09/10/2019   Lab Results  Component Value Date   HGBA1C 5.5 08/23/2016     BNP    Component Value Date/Time   BNP 164.0 (H) 08/23/2016 0200    ProBNP No results found for: PROBNP   Lipid Panel     Component Value Date/Time   CHOL WILL FOLLOW 09/10/2019 0829   TRIG WILL FOLLOW 09/10/2019 0829   HDL WILL FOLLOW 09/10/2019 0829   CHOLHDL WILL FOLLOW 09/10/2019 0829   CHOLHDL  2.3 10/10/2016 0951   VLDL 10 10/10/2016 0951   LDLCALC WILL FOLLOW 09/10/2019 0829     RADIOLOGY: No results found.   ASSESSMENT:    1. Acute ST elevation myocardial infarction (STEMI) involving left anterior descending (LAD) coronary artery ([place]): 08/22/2016 with DES stent to LAD   2. Essential hypertension   3. Hyperlipidemia with target LDL less than 70   4. Aortic atherosclerosis ([place])   5. Medication management      PLAN:  Mr Graefe is a very pleasant 78 year old Caucasian male who develeoped  an ACS/Anterior STEMI with 11 mm ST elevation precordially on Halloween 2017. Fortunately he acted quickly and was prompty brought to [facility] cath lab where I performed emergent cath/PCI .  He had an DTB time of 19 minutes from arrival to the lab and his chest pain duration was less than 60 minutes. Due to disease beyond the initial occlusion he underwent tandem DES stenting with an excellent result.  He has been demonstrated to have complete myocardial salvage and his ECG is normal.  His last echo Doppler study in May 2019 continued to show an EF of 60 to 65% without wall motion abnormalities.  He had trivial AR, mild MR, trivial TR and mild PR.  There was increased thickness of the interatrial septum consistent with lipomatous hypertrophy.  There was mild/moderate LA dilation.  Over the past year, his medical case was interviewed on [employer] on his birthday December 09, 2016.  Presently Mr. Hartsell continues to remain stable without anginal symptomatology.  He continues to be active and walks 2 miles at least 4 days/week and does all his yard work.  He continues to have a soft 2/6 systolic murmur which is unchanged.  His last echo only showed mild TimothyMarland Kitchen  He continues to be on dual antiplatelet therapy with low-dose Brilinta 60 mg and aspirin 81 mg.  Blood pressure is controlled on lisinopril 2.5 mg.  We continue to treat him aggressively with lipid management and he is on atorvastatin 80 mg.  I will check a complete set of fasting laboratory.  As long as he remains stable I will see him in 1 year for reevaluation or sooner as necessary.   Medication Adjustments/Labs and Tests Ordered: Current medicines are reviewed at length with the patient today.  Concerns regarding medicines are outlined above.  Medication changes, Labs and Tests ordered today are listed in the Patient Instructions below.  Patient Instructions  Labwork: FASTING LIPID, CMET, TSH, CBC HERE IN OUR OFFICE AT [facility]   You will need to fast. DO  NOT EAT OR DRINK PAST MIDNIGHT.       If you have labs (blood work) drawn today and your tests are completely normal, you will receive your results only by: Marland Kitchen MyChart Message (if you have MyChart) OR . A paper copy in the mail If you have any lab test that is abnormal or we need to change your treatment, we will call you to review the results.  Follow-Up: IN 12 months Please call our office 2 months in advance, SEPT 2021 to schedule this NOV 2021 appointment. In Person Shelva Majestic, MD.    At [facility], you and your health needs are our priority.  As part of our continuing mission to provide you with exceptional heart care, we have created designated Provider Care Teams.  These Care Teams include your primary Cardiologist (physician) and Advanced Practice Providers (APPs -  Physician Assistants and Nurse Practitioners) who all work  together to provide you with the care you need, when you need it.  Thank you for choosing [facility] at [facility]!!          Signed, Shelva Majestic, MD , [facility] 09/10/2019 7:09 PM    [place] [address] [place], [place], [place]  [address] Phone: [phone]

## 2019-09-09 ENCOUNTER — Other Ambulatory Visit: Payer: Self-pay | Admitting: Family Medicine

## 2019-09-09 DIAGNOSIS — F329 Major depressive disorder, single episode, unspecified: Secondary | ICD-10-CM

## 2019-09-09 DIAGNOSIS — F32A Depression, unspecified: Secondary | ICD-10-CM

## 2019-09-10 ENCOUNTER — Encounter: Payer: Self-pay | Admitting: Cardiovascular Disease

## 2019-09-10 DIAGNOSIS — Z79899 Other long term (current) drug therapy: Secondary | ICD-10-CM | POA: Diagnosis not present

## 2019-09-10 DIAGNOSIS — E785 Hyperlipidemia, unspecified: Secondary | ICD-10-CM | POA: Diagnosis not present

## 2019-09-10 DIAGNOSIS — I25118 Atherosclerotic heart disease of native coronary artery with other forms of angina pectoris: Secondary | ICD-10-CM | POA: Diagnosis not present

## 2019-09-15 ENCOUNTER — Telehealth: Payer: Self-pay | Admitting: Cardiovascular Disease

## 2019-09-15 NOTE — Telephone Encounter (Signed)
Patient calling in regards to his lab results on 09/10/19

## 2019-09-15 NOTE — Telephone Encounter (Signed)
Attempted to call patient - not all results are available for review. No answer when called

## 2019-09-17 LAB — COMPREHENSIVE METABOLIC PANEL
ALT: 12 IU/L (ref 0–44)
AST: 24 IU/L (ref 0–40)
Albumin/Globulin Ratio: 1.8 (ref 1.2–2.2)
Albumin: 5.5 g/dL — ABNORMAL HIGH (ref 3.7–4.7)
Alkaline Phosphatase: 116 IU/L (ref 39–117)
BUN/Creatinine Ratio: 17 (ref 10–24)
BUN: 20 mg/dL (ref 8–27)
Bilirubin Total: 0.5 mg/dL (ref 0.0–1.2)
CO2: 21 mmol/L (ref 20–29)
Calcium: 10.9 mg/dL — ABNORMAL HIGH (ref 8.6–10.2)
Creatinine, Ser: 1.2 mg/dL (ref 0.76–1.27)
GFR calc Af Amer: 67 mL/min/{1.73_m2} (ref >59)
GFR calc non Af Amer: 58 mL/min/{1.73_m2} — ABNORMAL LOW (ref >59)
Globulin, Total: 3 g/dL (ref 1.5–4.5)
Glucose: 123 mg/dL — ABNORMAL HIGH (ref 65–99)
Total Protein: 8.5 g/dL (ref 6.0–8.5)

## 2019-09-17 LAB — LIPID PANEL
Chol/HDL Ratio: 2.1 ratio (ref 0.0–5.0)
Cholesterol, Total: 170 mg/dL (ref 100–199)
HDL: 80 mg/dL (ref >39)
LDL Chol Calc (NIH): 73 mg/dL (ref 0–99)
Triglycerides: 95 mg/dL (ref 0–149)
VLDL Cholesterol Cal: 17 mg/dL (ref 5–40)

## 2019-09-17 LAB — CBC
Hematocrit: 42.8 % (ref 37.5–51.0)
Hemoglobin: 14.5 g/dL (ref 13.0–17.7)
MCH: 32.3 pg (ref 26.6–33.0)
MCHC: 33.9 g/dL (ref 31.5–35.7)
MCV: 95 fL (ref 79–97)
Platelets: 192 10*3/uL (ref 150–450)
RBC: 4.49 x10E6/uL (ref 4.14–5.80)
RDW: 11.9 % (ref 11.6–15.4)
WBC: 5 10*3/uL (ref 3.4–10.8)

## 2019-09-17 LAB — TSH: TSH: 1.54 u[IU]/mL (ref 0.450–4.500)

## 2019-09-22 ENCOUNTER — Other Ambulatory Visit: Payer: Self-pay | Admitting: Cardiovascular Disease

## 2019-09-22 NOTE — Telephone Encounter (Signed)
Lm to call back ./cy 

## 2019-09-23 LAB — COMPREHENSIVE METABOLIC PANEL
ALT: 19 IU/L (ref 0–44)
AST: 21 IU/L (ref 0–40)
Albumin/Globulin Ratio: 2 (ref 1.2–2.2)
Albumin: 4.3 g/dL (ref 3.7–4.7)
Alkaline Phosphatase: 85 IU/L (ref 39–117)
BUN/Creatinine Ratio: 19 (ref 10–24)
BUN: 19 mg/dL (ref 8–27)
Bilirubin Total: 0.7 mg/dL (ref 0.0–1.2)
CO2: 26 mmol/L (ref 20–29)
Calcium: 9.4 mg/dL (ref 8.6–10.2)
Chloride: 105 mmol/L (ref 96–106)
Creatinine, Ser: 1 mg/dL (ref 0.76–1.27)
GFR calc Af Amer: 84 mL/min/{1.73_m2} (ref >59)
GFR calc non Af Amer: 73 mL/min/{1.73_m2} (ref >59)
Globulin, Total: 2.2 g/dL (ref 1.5–4.5)
Glucose: 91 mg/dL (ref 65–99)
Potassium: 5.1 mmol/L (ref 3.5–5.2)
Sodium: 142 mmol/L (ref 134–144)
Total Protein: 6.5 g/dL (ref 6.0–8.5)

## 2019-09-24 NOTE — Telephone Encounter (Signed)
Patient received results via MyChart

## 2019-10-20 DIAGNOSIS — D0439 Carcinoma in situ of skin of other parts of face: Secondary | ICD-10-CM | POA: Diagnosis not present

## 2019-10-29 ENCOUNTER — Encounter: Payer: Self-pay | Admitting: Family Medicine

## 2019-10-31 ENCOUNTER — Encounter: Payer: Self-pay | Admitting: Family Medicine

## 2019-11-12 ENCOUNTER — Ambulatory Visit: Payer: Medicare Other | Attending: Internal Medicine

## 2019-11-12 DIAGNOSIS — Z23 Encounter for immunization: Secondary | ICD-10-CM | POA: Insufficient documentation

## 2019-11-12 NOTE — Progress Notes (Signed)
   Covid-19 Vaccination Clinic  Name:  YUKIO LEWARK    MRN: [id number] DOB: 03/15/1941  11/12/2019  Mr. Linzmeier was observed post Covid-19 immunization for 15 minutes without incidence. He was provided with Vaccine Information Sheet and instruction to access the V-Safe system.   Mr. Zeitz was instructed to call 911 with any severe reactions post vaccine: Marland Kitchen Difficulty breathing  . Swelling of your face and throat  . A fast heartbeat  . A bad rash all over your body  . Dizziness and weakness    Immunizations Administered    Name Date Dose VIS Date Route   Pfizer COVID-19 Vaccine 11/12/2019  9:26 AM 0.3 mL 10/03/2019 Intramuscular   Manufacturer: [place]   Lot: [id number]   [place]: [id number]

## 2019-11-16 ENCOUNTER — Other Ambulatory Visit: Payer: Self-pay | Admitting: Cardiovascular Disease

## 2019-11-30 ENCOUNTER — Ambulatory Visit: Payer: Medicare Other | Attending: Internal Medicine

## 2019-11-30 DIAGNOSIS — Z23 Encounter for immunization: Secondary | ICD-10-CM | POA: Insufficient documentation

## 2019-11-30 NOTE — Progress Notes (Signed)
   Covid-19 Vaccination Clinic  Name:  Timothy Escobar    MRN: [id number] DOB: 13-Sep-1941  11/30/2019  Timothy Escobar was observed post Covid-19 immunization for 15 minutes without incidence. He was provided with Vaccine Information Sheet and instruction to access the V-Safe system.   Timothy Escobar was instructed to call 911 with any severe reactions post vaccine: Marland Kitchen Difficulty breathing  . Swelling of your face and throat  . A fast heartbeat  . A bad rash all over your body  . Dizziness and weakness    Immunizations Administered    Name Date Dose VIS Date Route   Pfizer COVID-19 Vaccine 11/30/2019  1:09 PM 0.3 mL 10/03/2019 Intramuscular   Manufacturer: [place]   Lot: [id number]   [place]: [id number]

## 2019-12-09 ENCOUNTER — Other Ambulatory Visit: Payer: Self-pay | Admitting: Family Medicine

## 2019-12-09 DIAGNOSIS — F329 Major depressive disorder, single episode, unspecified: Secondary | ICD-10-CM

## 2019-12-09 DIAGNOSIS — F32A Depression, unspecified: Secondary | ICD-10-CM

## 2019-12-21 ENCOUNTER — Other Ambulatory Visit: Payer: Self-pay | Admitting: Cardiovascular Disease

## 2019-12-30 DIAGNOSIS — D1801 Hemangioma of skin and subcutaneous tissue: Secondary | ICD-10-CM | POA: Diagnosis not present

## 2019-12-30 DIAGNOSIS — L570 Actinic keratosis: Secondary | ICD-10-CM | POA: Diagnosis not present

## 2019-12-30 DIAGNOSIS — D485 Neoplasm of uncertain behavior of skin: Secondary | ICD-10-CM | POA: Diagnosis not present

## 2019-12-30 DIAGNOSIS — L814 Other melanin hyperpigmentation: Secondary | ICD-10-CM | POA: Diagnosis not present

## 2019-12-30 DIAGNOSIS — C44329 Squamous cell carcinoma of skin of other parts of face: Secondary | ICD-10-CM | POA: Diagnosis not present

## 2019-12-30 DIAGNOSIS — L905 Scar conditions and fibrosis of skin: Secondary | ICD-10-CM | POA: Diagnosis not present

## 2020-01-12 DIAGNOSIS — H0102B Squamous blepharitis left eye, upper and lower eyelids: Secondary | ICD-10-CM | POA: Diagnosis not present

## 2020-01-12 DIAGNOSIS — H02206 Unspecified lagophthalmos left eye, unspecified eyelid: Secondary | ICD-10-CM | POA: Diagnosis not present

## 2020-01-12 DIAGNOSIS — H5201 Hypermetropia, right eye: Secondary | ICD-10-CM | POA: Diagnosis not present

## 2020-01-12 DIAGNOSIS — H0102A Squamous blepharitis right eye, upper and lower eyelids: Secondary | ICD-10-CM | POA: Diagnosis not present

## 2020-01-12 DIAGNOSIS — H04123 Dry eye syndrome of bilateral lacrimal glands: Secondary | ICD-10-CM | POA: Diagnosis not present

## 2020-01-12 DIAGNOSIS — H25813 Combined forms of age-related cataract, bilateral: Secondary | ICD-10-CM | POA: Diagnosis not present

## 2020-02-03 DIAGNOSIS — D0439 Carcinoma in situ of skin of other parts of face: Secondary | ICD-10-CM | POA: Diagnosis not present

## 2020-02-03 DIAGNOSIS — S0081XA Abrasion of other part of head, initial encounter: Secondary | ICD-10-CM | POA: Diagnosis not present

## 2020-03-03 ENCOUNTER — Other Ambulatory Visit: Payer: Self-pay

## 2020-03-03 MED ORDER — NITROGLYCERIN 0.4 MG SL SUBL: 0.4000 mg | SUBLINGUAL_TABLET | SUBLINGUAL | 3 refills | Status: AC | PRN

## 2020-03-08 DIAGNOSIS — L905 Scar conditions and fibrosis of skin: Secondary | ICD-10-CM | POA: Diagnosis not present

## 2020-03-08 DIAGNOSIS — Z85828 Personal history of other malignant neoplasm of skin: Secondary | ICD-10-CM | POA: Diagnosis not present

## 2020-03-09 ENCOUNTER — Other Ambulatory Visit: Payer: Self-pay | Admitting: Family Medicine

## 2020-03-09 DIAGNOSIS — F32A Depression, unspecified: Secondary | ICD-10-CM

## 2020-03-09 NOTE — Telephone Encounter (Signed)
Courtesy refill. Pt has appt 04/01/2020

## 2020-04-01 ENCOUNTER — Ambulatory Visit (INDEPENDENT_AMBULATORY_CARE_PROVIDER_SITE_OTHER): Payer: Medicare Other | Admitting: Family Medicine

## 2020-04-01 ENCOUNTER — Encounter: Payer: Self-pay | Admitting: Family Medicine

## 2020-04-01 ENCOUNTER — Other Ambulatory Visit: Payer: Self-pay

## 2020-04-01 ENCOUNTER — Ambulatory Visit (INDEPENDENT_AMBULATORY_CARE_PROVIDER_SITE_OTHER): Payer: Medicare Other

## 2020-04-01 VITALS — BP 122/78 | HR 58 | Temp 98.2°F | Ht 69.0 in | Wt 170.0 lb

## 2020-04-01 DIAGNOSIS — F329 Major depressive disorder, single episode, unspecified: Secondary | ICD-10-CM | POA: Diagnosis not present

## 2020-04-01 DIAGNOSIS — M25559 Pain in unspecified hip: Secondary | ICD-10-CM

## 2020-04-01 DIAGNOSIS — M7062 Trochanteric bursitis, left hip: Secondary | ICD-10-CM | POA: Diagnosis not present

## 2020-04-01 DIAGNOSIS — E785 Hyperlipidemia, unspecified: Secondary | ICD-10-CM | POA: Diagnosis not present

## 2020-04-01 DIAGNOSIS — F32A Depression, unspecified: Secondary | ICD-10-CM

## 2020-04-01 DIAGNOSIS — M1612 Unilateral primary osteoarthritis, left hip: Secondary | ICD-10-CM | POA: Diagnosis not present

## 2020-04-01 MED ORDER — FLUOXETINE HCL 20 MG PO CAPS: 20.0000 mg | ORAL_CAPSULE | Freq: Every day | ORAL | 3 refills | Status: DC

## 2020-04-01 NOTE — Progress Notes (Signed)
Subjective:  Patient ID: Timothy Escobar, male    DOB: 1940-11-14  Age: 79 y.o. MRN: [id number]  CC:  Chief Complaint  Patient presents with  . Follow-up    on hyperlipidemia. pt reports no physical symptoms of this condition.  . Hip Pain    pt has presistant hip pain in his L hip pat als states he has that pain in his upper L thigh as well. pt states he has had this pain on and off for years now, but getting more presistant.    HPI Timothy Escobar presents for   Hyperlipidemia: Lipitor 80 mg daily.  Also takes Brilinta, aspirin with history CAD, STEMI with drug-eluting stent placed in 2017..  Cardiologist Dr. Claiborne Billings.,  Appointment November 2020.  Also thought to have mitral regurgitation, mild on previous echo.  Aortic atherosclerosis noted on previous CT of abdomen, was continued on current regimen.  Still active, no chest pain or DOE. Feels well overall.  Same doses meds for awhile without new side effects.   Lab Results  Component Value Date   CHOL 170 09/10/2019   HDL 80 09/10/2019   LDLCALC 73 09/10/2019   TRIG 95 09/10/2019   CHOLHDL 2.1 09/10/2019   Lab Results  Component Value Date   ALT 19 09/23/2019   AST 21 09/23/2019   ALKPHOS 85 09/23/2019   BILITOT 0.7 09/23/2019   Depression: Doing well on prozac. Would like to continue prozac at this time.   Depression screen [facility] 2/9 04/01/2020 04/01/2020 08/07/2019 05/06/2018 04/29/2018  Decreased Interest 0 0 0 0 0  Down, Depressed, Hopeless 0 0 0 0 0  PHQ - 2 Score 0 0 0 0 0  Altered sleeping 0 - - - -  Tired, decreased energy 0 - - - -  Change in appetite 0 - - - -  Feeling bad or failure about yourself  0 - - - -  Trouble concentrating 0 - - - -  Moving slowly or fidgety/restless 0 - - - -  Suicidal thoughts 0 - - - -  PHQ-9 Score 0 - - - -     Left hip pain Past 30 years, saw chiropracter in past. Infrequent. Pain in outside of hip or upper buttock. On and off for years, more frequent past 5 years. Not  inhibiting activity. No usual pain radiating to leg.  No regular back pain or prior surgery/injections.  No hip surgery/injections.  Soreness persistent on outside of hip.  Upper buttock area on left at times.  Sore in both areas with standing after prolonged sitting. Some night pain at times if on left side.  Tx: ice, heat, aspercreme at times. No meds.   Negative hip x-ray in 2013.   History Patient Active Problem List   Diagnosis Date Noted  . Bacterial conjunctivitis of both eyes 01/23/2018  . Sinus bradycardia 01/23/2017  . Acute bronchitis 11/21/2016  . Dyslipidemia   . CAD (coronary artery disease)   . Status post coronary artery stent placement   . Bradycardia   . Hypokalemia   . Hyperlipidemia with target LDL less than 70   . STEMI (ST elevation myocardial infarction) ([place]) 08/22/2016  . Acute ST elevation myocardial infarction (STEMI) involving left anterior descending (LAD) coronary artery ([place]) 08/22/2016  . Hip pain 09/28/2012  . Chronic depression 09/28/2012   Past Medical History:  Diagnosis Date  . Blood transfusion without reported diagnosis   . CAD (coronary artery disease)    a. 08/22/2016  Ant STEMI/PCI: LM nl, LAD 30ost, 100p (3.0x15 Synergy DES & 2.75x24 Synergy DES), LCX large/dominant/nl, RCA nondom, nl;  b. 08/2016 Echo: Ef 60-65%, no rwma.  . Cataract    Phreesia 03/29/2020  . Depression   . Dyslipidemia   . Heart murmur    Phreesia 03/29/2020  . Myocardial infarction ([place])    Phreesia 03/29/2020  . Skin cancer    skin cancer   Past Surgical History:  Procedure Laterality Date  . APPENDECTOMY    . CARDIAC CATHETERIZATION N/A 08/22/2016   Procedure: Left Heart Cath and Coronary Angiography;  Surgeon: Troy Sine, MD;  Location: [place] [facility];  Service: Cardiovascular;  Laterality: N/A;  . CARDIAC CATHETERIZATION N/A 08/22/2016   Procedure: Coronary Stent Intervention;  Surgeon: Troy Sine, MD;  Location: [place] [facility];   Service: Cardiovascular;  Laterality: N/A;  MID LAD  . COLON SURGERY N/A    Phreesia 03/29/2020  . HERNIA REPAIR    . parotid gland cyst removal Right 2012  . vocal fold polyp removed     No Known Allergies Prior to Admission medications   Medication Sig Start Date End Date Taking? Authorizing Provider  aspirin EC 81 MG EC tablet Take 1 tablet (81 mg total) by mouth daily. 08/26/16  Yes Maryellen Pile, MD  atorvastatin (LIPITOR) 80 MG tablet TAKE 1 TABLET BY MOUTH ONCE DAILY AT  6  00  PM 12/23/19  Yes Troy Sine, MD  BRILINTA 60 MG TABS tablet Take 1 tablet by mouth twice daily 11/17/19  Yes Troy Sine, MD  calcium-vitamin D (OSCAL WITH D) 500-200 MG-UNIT per tablet Take 1 tablet by mouth daily. 1,000 of Vit D3   Yes [provider]  cholecalciferol (VITAMIN D) 1000 units tablet Take 1,000 Units by mouth daily.   Yes [provider]  FLUoxetine (PROZAC) 20 MG capsule Take 1 capsule by mouth once daily 03/09/20  Yes Wendie Agreste, MD  Ginger Root POWD by Does not apply route. Mixes 1/2 teaspoon of powder into hot tea once daily   Yes [provider]  lisinopril (ZESTRIL) 2.5 MG tablet Take 1 tablet by mouth once daily 11/17/19  Yes Troy Sine, MD  Multiple Vitamins-Minerals (MULTIVITAMIN WITH MINERALS) tablet Take 1 tablet by mouth once a week. Centrum Silver    Yes [provider]  nitroGLYCERIN (NITROSTAT) 0.4 MG SL tablet Place 1 tablet (0.4 mg total) under the tongue every 5 (five) minutes x 3 doses as needed for chest pain. 03/03/20  Yes Troy Sine, MD  vitamin C (ASCORBIC ACID) 500 MG tablet Take 500 mg by mouth daily.   Yes [provider]   Social History   Socioeconomic History  . Marital status: Married    Spouse name: Not on file  . Number of children: 2  . Years of education: college  . Highest education level: Not on file  Occupational History  . Occupation: Retired - Water quality scientist  Tobacco Use  . Smoking  status: Former Smoker    Quit date: 01/22/1967    Years since quitting: 53.2  . Smokeless tobacco: Never Used  Vaping Use  . Vaping Use: Never used  Substance and Sexual Activity  . Alcohol use: Yes    Alcohol/week: 7.0 standard drinks    Types: 7 Standard drinks or equivalent per week    Comment: wine: 1 glass daily  . Drug use: Not on file  . Sexual activity: Never  Other Topics Concern  . Not on file  Social History Narrative   Lives w/ wife in [place]. 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, plus the church choir.   Exercise walk/gardening daily for 1 hr plus   Social Determinants of Health   Financial Resource Strain:   . Difficulty of Paying Living Expenses:   Food Insecurity:   . Worried About Charity fundraiser in the Last Year:   . Arboriculturist in the Last Year:   Transportation Needs:   . Film/video editor (Medical):   Marland Kitchen Lack of Transportation (Non-Medical):   Physical Activity:   . Days of Exercise per Week:   . Minutes of Exercise per Session:   Stress:   . Feeling of Stress :   Social Connections:   . Frequency of Communication with Friends and Family:   . Frequency of Social Gatherings with Friends and Family:   . Attends Religious Services:   . Active Member of Clubs or Organizations:   . Attends Archivist Meetings:   Marland Kitchen Marital Status:   Intimate Partner Violence:   . Fear of Current or Ex-Partner:   . Emotionally Abused:   Marland Kitchen Physically Abused:   . Sexually Abused:     Review of Systems Per HPI.   Objective:   Vitals:   04/01/20 1023  BP: 122/78  Pulse: (!) 58  Temp: 98.2 F (36.8 C)  TempSrc: Temporal  SpO2: 96%  Weight: 170 lb (77.1 kg)  Height: 5\' 9"  (1.753 m)     Physical Exam Constitutional:      General: He is not in acute distress.    Appearance: He is well-developed.  HENT:     Head: Normocephalic and atraumatic.  Cardiovascular:     Rate and Rhythm:  Normal rate.  Pulmonary:     Effort: Pulmonary effort is normal.  Musculoskeletal:     Comments: Lumbar spine, pain-free range of motion, able to heel toe walk without difficulty, negative seated straight leg raise.  Right hip pain-free range of motion  Left hip pain-free internal/external rotation.  Tender to palpation over the trochanteric bursa.  Slight discomfort at the sciatic notch toward SI joint, but primarily at sciatic notch.  No pain with piriformis stretch.  Neurological:     General: No focal deficit present.     Mental Status: He is alert and oriented to person, place, and time.  Psychiatric:        Mood and Affect: Mood normal.        Behavior: Behavior normal.        Thought Content: Thought content normal.      DG HIP UNILAT W OR W/O PELVIS 2-3 VIEWS LEFT  Result Date: 04/01/2020 CLINICAL DATA:  Pain EXAM: DG HIP (WITH OR WITHOUT PELVIS) 2-3V LEFT COMPARISON:  September 26, 2012 FINDINGS: Frontal pelvis as well as frontal and lateral left hip images were obtained. No fracture or dislocation. Slight symmetric narrowing of each hip joint noted. No erosive change. There is degenerative change in the lower lumbar spine, stable. Sacroiliac joints appear unremarkable bilaterally. IMPRESSION: Slight symmetric narrowing of each hip joint. More severe degenerative change noted in the lumbar spine region, stable. No fracture or dislocation. Electronically Signed   By: Lowella Grip III M.D.   On: 04/01/2020 11:19     Assessment & Plan:  Timothy Escobar is a 79 y.o. male . Hyperlipidemia,  unspecified hyperlipidemia type  -Tolerating current regimen, continue same.  As he has been on same meds for a while, most recent testing was normal, will defer lab work at this time.  Depression, unspecified depression type - Plan: FLUoxetine (PROZAC) 20 MG capsule  -Stable, continue Prozac same dose.  Hip pain - Plan: DG HIP UNILAT W OR W/O PELVIS 2-3 VIEWS LEFT Trochanteric bursitis  of left hip  - 2 areas of pain, one area does appear to be trochanteric bursitis, posterior aspect may be related to lumbar spine degenerative changes.  Hip joint proper appears to be overall reassuring on imaging.  SI joint reassuring on imaging.  Cardiac history will avoid NSAID at this time.  Tylenol over-the-counter if needed, topical treatment okay, recheck 1 month.  Discussed injection but declined at this time.  Handout given on home exercise program, cautiously can try this but stop if any pain.   No orders of the defined types were placed in this encounter.  Patient Instructions       If you have lab work done today you will be contacted with your lab results within the next 2 weeks.  If you have not heard from [place] then please contact us. The fastest way to get your results is to register for My Chart.   IF you received an x-ray today, you will receive an invoice from [facility]. Please contact [facility] at [phone] with questions or concerns regarding your invoice.   IF you received labwork today, you will receive an invoice from [place]. Please contact [facility] at [phone] with questions or concerns regarding your invoice.   Our billing staff will not be able to assist you with questions regarding bills from these companies.  You will be contacted with the lab results as soon as they are available. The fastest way to get your results is to activate your My Chart account. Instructions are located on the last page of this paperwork. If you have not heard from [place] regarding the results in 2 weeks, please contact this office.         Signed, Merri Ray, MD [facility]

## 2020-04-01 NOTE — Patient Instructions (Addendum)
Outer hip pain appears to be trochanteric bursitis.  Can try some of the stretches listed below, Tylenol as needed.  If any restrictions causing pain or pain in the back or other areas do not do them.  I would consider injection if that is not improving.  Recheck 1 month, sooner if worse.  Area at the back part may be related to your lumbar spine and arthritis.  Tylenol as needed, range of motion and stretches as needed. Recheck 1 month.  Continue same doses of other medications.   Hip Bursitis Rehab Ask your health care provider which exercises are safe for you. Do exercises exactly as told by your health care provider and adjust them as directed. It is normal to feel mild stretching, pulling, tightness, or discomfort as you do these exercises. Stop right away if you feel sudden pain or your pain gets worse. Do not begin these exercises until told by your health care provider. Stretching exercise This exercise warms up your muscles and joints and improves the movement and flexibility of your hip. This exercise also helps to relieve pain and stiffness. Iliotibial band stretch An iliotibial band is a strong band of muscle tissue that runs from the outer side of your hip to the outer side of your thigh and knee. Lie on your side with your left / right leg in the top position. Bend your left / right knee and grab your ankle. Stretch out your bottom arm to help you balance. Slowly bring your knee back so your thigh is behind your body. Slowly lower your knee toward the floor until you feel a gentle stretch on the outside of your left / right thigh. If you do not feel a stretch and your knee will not fall farther, place the heel of your other foot on top of your knee and pull your knee down toward the floor with your foot. Hold this position for __________ seconds. Slowly return to the starting position. Repeat __________ times. Complete this exercise __________ times a day. Strengthening  exercises These exercises build strength and endurance in your hip and pelvis. Endurance is the ability to use your muscles for a long time, even after they get tired. Bridge This exercise strengthens the muscles that move your thigh backward (hip extensors). Lie on your back on a firm surface with your knees bent and your feet flat on the floor. Tighten your buttocks muscles and lift your buttocks off the floor until your trunk is level with your thighs. Do not arch your back. You should feel the muscles working in your buttocks and the back of your thighs. If you do not feel these muscles, slide your feet 1-2 inches (2.5-5 cm) farther away from your buttocks. If this exercise is too easy, try doing it with your arms crossed over your chest. Hold this position for __________ seconds. Slowly lower your hips to the starting position. Let your muscles relax completely after each repetition. Repeat __________ times. Complete this exercise __________ times a day. Squats This exercise strengthens the muscles in front of your thigh and knee (quadriceps). Stand in front of a table, with your feet and knees pointing straight ahead. You may rest your hands on the table for balance but not for support. Slowly bend your knees and lower your hips like you are going to sit in a chair. Keep your weight over your heels, not over your toes. Keep your lower legs upright so they are parallel with the table legs. Do  not let your hips go lower than your knees. Do not bend lower than told by your health care provider. If your hip pain increases, do not bend as low. Hold the squat position for __________ seconds. Slowly push with your legs to return to standing. Do not use your hands to pull yourself to standing. Repeat __________ times. Complete this exercise __________ times a day. Hip hike Stand sideways on a bottom step. Stand on your left / right leg with your other foot unsupported next to the step. You  can hold on to the railing or wall for balance if needed. Keep your knees straight and your torso square. Then lift your left / right hip up toward the ceiling. Hold this position for __________ seconds. Slowly let your left / right hip lower toward the floor, past the starting position. Your foot should get closer to the floor. Do not lean or bend your knees. Repeat __________ times. Complete this exercise __________ times a day. Single leg stand Without shoes, stand near a railing or in a doorway. You may hold on to the railing or door frame as needed for balance. Squeeze your left / right buttock muscles, then lift up your other foot. Do not let your left / right hip push out to the side. It is helpful to stand in front of a mirror for this exercise so you can watch your hip. Hold this position for __________ seconds. Repeat __________ times. Complete this exercise __________ times a day. This information is not intended to replace advice given to you by your health care provider. Make sure you discuss any questions you have with your health care provider. Document Revised: 02/03/2019 Document Reviewed: 02/03/2019 Elsevier Patient Education  [place].  Hip Bursitis  Hip bursitis is swelling of a fluid-filled sac (bursa) in your hip joint. This swelling (inflammation) can be painful. This condition may come and go over time. What are the causes?  Injury to the hip.  Overuse of the muscles that surround the hip joint.  An earlier injury or surgery of the hip.  Arthritis or gout.  Diabetes.  Thyroid disease.  Infection.  In some cases, the cause may not be known. What are the signs or symptoms?  Mild or moderate pain in the hip area. Pain may get worse with movement.  Tenderness and swelling of the hip, especially on the outer side of the hip.  In rare cases, the bursa may become infected. This may cause: ? A fever. ? Warmth and redness in the area. Symptoms may  come and go. How is this treated? This condition is treated by resting, icing, applying pressure (compression), and raising (elevating) the injured area. You may hear this called the RICE treatment. Treatment may also include:  Using crutches.  Draining fluid out of the bursa to help relieve swelling.  Giving a shot of (injecting) medicine that helps to reduce swelling (cortisone).  Other medicines if the bursa is infected. Follow these instructions at home: Managing pain, stiffness, and swelling   If told, put ice on the painful area. ? Put ice in a plastic bag. ? Place a towel between your skin and the bag. ? Leave the ice on for 20 minutes, 2-3 times a day. ? Raise (elevate) your hip above the level of your heart as much as you can without pain. To do this, try putting a pillow under your hips while you lie down. Stop if this causes pain. Activity  Return to  your normal activities as told by your doctor. Ask your doctor what activities are safe for you.  Rest and protect your hip as much as you can until you feel better. General instructions  Take over-the-counter and prescription medicines only as told by your doctor.  Wear wraps that put pressure on your hip (compression wraps) only as told by your doctor.  Do not use your hip to support your body weight until your doctor says that you can.  Use crutches as told by your doctor.  Gently rub and stretch your injured area as often as is comfortable.  Keep all follow-up visits as told by your doctor. This is important. How is this prevented?  Exercise regularly, as told by your doctor.  Warm up and stretch before being active.  Cool down and stretch after being active.  Avoid activities that bother your hip or cause pain.  Avoid sitting down for long periods at a time. Contact a doctor if:  You have a fever.  You get new symptoms.  You have trouble walking.  You have trouble doing everyday activities.  You  have pain that gets worse.  You have pain that does not get better with medicine.  You get red skin on your hip area.  You get a feeling of warmth in your hip area. Get help right away if:  You cannot move your hip.  You have very bad pain. Summary  Hip bursitis is swelling of a fluid-filled sac (bursa) in your hip.  Hip bursitis can be painful.  Symptoms often come and go over time.  This condition is treated with rest, ice, compression, elevation, and medicines. This information is not intended to replace advice given to you by your health care provider. Make sure you discuss any questions you have with your health care provider. Document Revised: 06/17/2018 Document Reviewed: 06/17/2018 Elsevier Patient Education  [employer].   If you have lab work done today you will be contacted with your lab results within the next 2 weeks.  If you have not heard from [place] then please contact us. The fastest way to get your results is to register for My Chart.   IF you received an x-ray today, you will receive an invoice from [facility]. Please contact [facility] at [phone] with questions or concerns regarding your invoice.   IF you received labwork today, you will receive an invoice from [place]. Please contact [facility] at [phone] with questions or concerns regarding your invoice.   Our billing staff will not be able to assist you with questions regarding bills from these companies.  You will be contacted with the lab results as soon as they are available. The fastest way to get your results is to activate your My Chart account. Instructions are located on the last page of this paperwork. If you have not heard from [place] regarding the results in 2 weeks, please contact this office.

## 2020-04-30 ENCOUNTER — Other Ambulatory Visit: Payer: Self-pay

## 2020-04-30 ENCOUNTER — Ambulatory Visit (INDEPENDENT_AMBULATORY_CARE_PROVIDER_SITE_OTHER): Payer: Medicare Other | Admitting: Family Medicine

## 2020-04-30 ENCOUNTER — Encounter: Payer: Self-pay | Admitting: Family Medicine

## 2020-04-30 VITALS — BP 135/75 | HR 67 | Temp 98.2°F | Ht 69.0 in | Wt 173.0 lb

## 2020-04-30 DIAGNOSIS — M25562 Pain in left knee: Secondary | ICD-10-CM | POA: Diagnosis not present

## 2020-04-30 DIAGNOSIS — M25559 Pain in unspecified hip: Secondary | ICD-10-CM | POA: Diagnosis not present

## 2020-04-30 DIAGNOSIS — M7062 Trochanteric bursitis, left hip: Secondary | ICD-10-CM

## 2020-04-30 NOTE — Patient Instructions (Addendum)
I am glad to hear that your hip pain has improved.  Okay to continue exercises.  Tylenol as needed.  If increasing pain, return for recheck as we may need to image her back or possible orthopedic evaluation.  If knee pain does not continue to improve or resolve in the next few weeks, please return for recheck and possible x-rays.  Return sooner if worse.    If you have lab work done today you will be contacted with your lab results within the next 2 weeks.  If you have not heard from [place] then please contact us. The fastest way to get your results is to register for My Chart.   IF you received an x-ray today, you will receive an invoice from [facility]. Please contact [facility] at [phone] with questions or concerns regarding your invoice.   IF you received labwork today, you will receive an invoice from [place]. Please contact [facility] at [phone] with questions or concerns regarding your invoice.   Our billing staff will not be able to assist you with questions regarding bills from these companies.  You will be contacted with the lab results as soon as they are available. The fastest way to get your results is to activate your My Chart account. Instructions are located on the last page of this paperwork. If you have not heard from [place] regarding the results in 2 weeks, please contact this office.

## 2020-04-30 NOTE — Progress Notes (Signed)
Subjective:  Patient ID: Timothy Escobar, male    DOB: [birth year]  Age: 79 y.o. MRN: [id number]  CC:  Chief Complaint  Patient presents with  . Follow-up    on L hip pain. Pt reports some improvment. Pt states he did the exercises that was givven to him during his las OV and pt reports improment states its not 100% but its a lot better than what it was. pt state he think that he will not need the cortison shot that was sudjested during last appt. Pt reports he isn't fasting today.    HPI Timothy Escobar presents for    Left hip pain: Follow-up from June 10.  Appear to have 2 areas of pain at that time, suspected trochanteric bursitis, and posterior pain possibly related to lumbar spine degenerative changes.  Overall reassuring imaging.  SI joint was reassuring on imaging.  Avoid NSAIDs given his cardiac history.  Initial symptomatic care with Tylenol over-the-counter, option of trochanteric bursa injection given if not improving with home exercises.  Today feels like pain is significantly improved.  Not quite 100% but definitely better. Still some soreness after sitting for awhile but improves quickly. Doing exercises and those are helping. Not having nighttime symptoms in hip.  Some low back soreness at times.   L knee pain at times, but it is better as well. Not limiting activities. Tylenol as needed only - few nights per week. No mechanical symptoms.    Hyperlipidemia: Not fasting today.  Has cardiology visit in November, bloodwork in October.  Lab Results  Component Value Date   CHOL 170 09/10/2019   HDL 80 09/10/2019   LDLCALC 73 09/10/2019   TRIG 95 09/10/2019   CHOLHDL 2.1 09/10/2019   Lab Results  Component Value Date   ALT 19 09/23/2019   AST 21 09/23/2019   ALKPHOS 85 09/23/2019   BILITOT 0.7 09/23/2019     History Patient Active Problem List   Diagnosis Date Noted  . Bacterial conjunctivitis of both eyes 01/23/2018  . Sinus bradycardia 01/23/2017  . Acute  bronchitis 11/21/2016  . Dyslipidemia   . CAD (coronary artery disease)   . Status post coronary artery stent placement   . Bradycardia   . Hypokalemia   . Hyperlipidemia with target LDL less than 70   . STEMI (ST elevation myocardial infarction) ([place]) 08/22/2016  . Acute ST elevation myocardial infarction (STEMI) involving left anterior descending (LAD) coronary artery ([place]) 08/22/2016  . Hip pain 09/28/2012  . Chronic depression 09/28/2012   Past Medical History:  Diagnosis Date  . Blood transfusion without reported diagnosis   . CAD (coronary artery disease)    a. 08/22/2016 Ant STEMI/PCI: LM nl, LAD 30ost, 100p (3.0x15 Synergy DES & 2.75x24 Synergy DES), LCX large/dominant/nl, RCA nondom, nl;  b. 08/2016 Echo: Ef 60-65%, no rwma.  . Cataract    Phreesia 03/29/2020  . Depression   . Dyslipidemia   . Heart murmur    Phreesia 03/29/2020  . Myocardial infarction ([place])    Phreesia 03/29/2020  . Skin cancer    skin cancer   Past Surgical History:  Procedure Laterality Date  . APPENDECTOMY    . CARDIAC CATHETERIZATION N/A 08/22/2016   Procedure: Left Heart Cath and Coronary Angiography;  Surgeon: Troy Sine, MD;  Location: [facility];  Service: Cardiovascular;  Laterality: N/A;  . CARDIAC CATHETERIZATION N/A 08/22/2016   Procedure: Coronary Stent Intervention;  Surgeon: Troy Sine, MD;  Location: [facility]  INVASIVE CV LAB;  Service: Cardiovascular;  Laterality: N/A;  MID LAD  . COLON SURGERY N/A    Phreesia 03/29/2020  . HERNIA REPAIR    . parotid gland cyst removal Right 2012  . vocal fold polyp removed     No Known Allergies Prior to Admission medications   Medication Sig Start Date End Date Taking? Authorizing Provider  aspirin EC 81 MG EC tablet Take 1 tablet (81 mg total) by mouth daily. 08/26/16  Yes Maryellen Pile, MD  atorvastatin (LIPITOR) 80 MG tablet TAKE 1 TABLET BY MOUTH ONCE DAILY AT  6  00  PM 12/23/19  Yes Troy Sine, MD  BRILINTA 60 MG TABS  tablet Take 1 tablet by mouth twice daily 11/17/19  Yes Troy Sine, MD  calcium-vitamin D (OSCAL WITH D) 500-200 MG-UNIT per tablet Take 1 tablet by mouth daily. 1,000 of Vit D3   Yes [provider]  cholecalciferol (VITAMIN D) 1000 units tablet Take 1,000 Units by mouth daily.   Yes [provider]  FLUoxetine (PROZAC) 20 MG capsule Take 1 capsule (20 mg total) by mouth daily. 04/01/20  Yes Wendie Agreste, MD  Ginger Root POWD by Does not apply route. Mixes 1/2 teaspoon of powder into hot tea once daily   Yes [provider]  lisinopril (ZESTRIL) 2.5 MG tablet Take 1 tablet by mouth once daily 11/17/19  Yes Troy Sine, MD  Multiple Vitamins-Minerals (MULTIVITAMIN WITH MINERALS) tablet Take 1 tablet by mouth once a week. Centrum Silver    Yes [provider]  nitroGLYCERIN (NITROSTAT) 0.4 MG SL tablet Place 1 tablet (0.4 mg total) under the tongue every 5 (five) minutes x 3 doses as needed for chest pain. 03/03/20  Yes Troy Sine, MD  vitamin C (ASCORBIC ACID) 500 MG tablet Take 500 mg by mouth daily.   Yes [provider]   Social History   Socioeconomic History  . Marital status: Married    Spouse name: Not on file  . Number of children: 2  . Years of education: college  . Highest education level: Not on file  Occupational History  . Occupation: Retired - Water quality scientist  Tobacco Use  . Smoking status: Former Smoker    Quit date: 01/22/1967    Years since quitting: 53.3  . Smokeless tobacco: Never Used  Vaping Use  . Vaping Use: Never used  Substance and Sexual Activity  . Alcohol use: Yes    Alcohol/week: 7.0 standard drinks    Types: 7 Standard drinks or equivalent per week    Comment: wine: 1 glass daily  . Drug use: Not on file  . Sexual activity: Never  Other Topics Concern  . Not on file  Social History Narrative   Lives w/ wife in [place]. Both are singers, they sing with the [employer] in [place] and with [employer], plus the church choir.   Exercise walk/gardening daily for 1 hr plus   Social Determinants of Health   Financial Resource Strain:   . Difficulty of Paying Living Expenses:   Food Insecurity:   . Worried About Charity fundraiser in the Last Year:   . Arboriculturist in the Last Year:   Transportation Needs:   . Film/video editor (Medical):   Marland Kitchen Lack of Transportation (Non-Medical):   Physical Activity:   . Days of Exercise per Week:   . Minutes of Exercise per Session:   Stress:   .  Feeling of Stress :   Social Connections:   . Frequency of Communication with Friends and Family:   . Frequency of Social Gatherings with Friends and Family:   . Attends Religious Services:   . Active Member of Clubs or Organizations:   . Attends Archivist Meetings:   Marland Kitchen Marital Status:   Intimate Partner Violence:   . Fear of Current or Ex-Partner:   . Emotionally Abused:   Marland Kitchen Physically Abused:   . Sexually Abused:     Review of Systems Per HPI  Objective:   Vitals:   04/30/20 0941  BP: 135/75  Pulse: 67  Temp: 98.2 F (36.8 C)  TempSrc: Temporal  SpO2: 97%  Weight: 173 lb (78.5 kg)  Height: 5\' 9"  (1.753 m)     Physical Exam Vitals reviewed.  Constitutional:      General: He is not in acute distress.    Appearance: He is well-developed.  HENT:     Head: Normocephalic and atraumatic.  Cardiovascular:     Rate and Rhythm: Normal rate.  Pulmonary:     Effort: Pulmonary effort is normal.  Musculoskeletal:     Comments: Left hip, pain-free range of motion, trochanteric bursa nontender.  Negative seated straight leg raise.  Right hip also with negative seated straight leg raise, pain-free range of motion.  Left knee, no effusion, skin intact.  Slight tenderness to palpation at medial and lateral joint lines.  No apparent soft tissue swelling.  Full range of motion.  Ambulating without assistance.  Neurological:     Mental Status: He is alert and  oriented to person, place, and time.        Assessment & Plan:  Timothy Escobar is a 79 y.o. male . Hip pain Trochanteric bursitis of left hip  -As discussed last visit possible contribution of lumbar spine degenerative disc disease as well as trochanteric bursitis, both improving with exercises.  Continue home exercise regimen.  RTC precautions given Tylenol as needed.  Left knee pain, unspecified chronicity  -Based on location suspected degenerative joint disease.  Symptoms improving, imaging deferred.  RTC precautions next few weeks if symptoms do not continue to improve, or sooner if worse.  Tylenol as needed short-term.  History of hyperlipidemia, has been stable on current regimen.  Has lab work planned with his cardiologist in October, deferred labs today.  No orders of the defined types were placed in this encounter.  Patient Instructions   I am glad to hear that your hip pain has improved.  Okay to continue exercises.  Tylenol as needed.  If increasing pain, return for recheck as we may need to image her back or possible orthopedic evaluation.  If knee pain does not continue to improve or resolve in the next few weeks, please return for recheck and possible x-rays.  Return sooner if worse.    If you have lab work done today you will be contacted with your lab results within the next 2 weeks.  If you have not heard from [place] then please contact us. The fastest way to get your results is to register for My Chart.   IF you received an x-ray today, you will receive an invoice from [facility]. Please contact [facility] at [phone] with questions or concerns regarding your invoice.   IF you received labwork today, you will receive an invoice from [facility]. Please contact [facility] at [phone] with questions or concerns regarding your invoice.   Our billing staff will  not be able to assist you with questions regarding bills from these companies.  You  will be contacted with the lab results as soon as they are available. The fastest way to get your results is to activate your My Chart account. Instructions are located on the last page of this paperwork. If you have not heard from [place] regarding the results in 2 weeks, please contact this office.         Signed, Merri Ray, MD [facility]

## 2020-06-23 ENCOUNTER — Telehealth: Payer: Self-pay | Admitting: Cardiovascular Disease

## 2020-06-23 DIAGNOSIS — I10 Essential (primary) hypertension: Secondary | ICD-10-CM

## 2020-06-23 DIAGNOSIS — Z79899 Other long term (current) drug therapy: Secondary | ICD-10-CM

## 2020-06-23 DIAGNOSIS — E785 Hyperlipidemia, unspecified: Secondary | ICD-10-CM

## 2020-06-23 NOTE — Telephone Encounter (Signed)
Patient called to schedule his 1 year f/u in November. I informed him all I saw available in November was for virtual appointments and the next in office was not until January. Patient also declined seeing an APP. He states this does not make sense and would like to speak with the nurse about whether a virtual appointment would be okay.

## 2020-06-23 NOTE — Telephone Encounter (Signed)
Scheduled appointment for next week with Dr Claiborne Billings  Lab orders placed

## 2020-06-24 DIAGNOSIS — Z79899 Other long term (current) drug therapy: Secondary | ICD-10-CM | POA: Diagnosis not present

## 2020-06-24 DIAGNOSIS — E785 Hyperlipidemia, unspecified: Secondary | ICD-10-CM | POA: Diagnosis not present

## 2020-06-24 DIAGNOSIS — I10 Essential (primary) hypertension: Secondary | ICD-10-CM | POA: Diagnosis not present

## 2020-06-24 LAB — COMPREHENSIVE METABOLIC PANEL
ALT: 21 IU/L (ref 0–44)
AST: 21 IU/L (ref 0–40)
Albumin/Globulin Ratio: 1.9 (ref 1.2–2.2)
Albumin: 4.3 g/dL (ref 3.7–4.7)
Alkaline Phosphatase: 83 IU/L (ref 48–121)
BUN/Creatinine Ratio: 18 (ref 10–24)
BUN: 18 mg/dL (ref 8–27)
Bilirubin Total: 0.7 mg/dL (ref 0.0–1.2)
CO2: 24 mmol/L (ref 20–29)
Calcium: 9.3 mg/dL (ref 8.6–10.2)
Chloride: 106 mmol/L (ref 96–106)
Creatinine, Ser: 1 mg/dL (ref 0.76–1.27)
GFR calc Af Amer: 84 mL/min/{1.73_m2} (ref >59)
GFR calc non Af Amer: 72 mL/min/{1.73_m2} (ref >59)
Globulin, Total: 2.3 g/dL (ref 1.5–4.5)
Glucose: 95 mg/dL (ref 65–99)
Potassium: 5.1 mmol/L (ref 3.5–5.2)
Sodium: 143 mmol/L (ref 134–144)
Total Protein: 6.6 g/dL (ref 6.0–8.5)

## 2020-06-24 LAB — CBC WITH DIFFERENTIAL/PLATELET
Basophils Absolute: 0 10*3/uL (ref 0.0–0.2)
Basos: 1 %
EOS (ABSOLUTE): 0.2 10*3/uL (ref 0.0–0.4)
Eos: 5 %
Hematocrit: 42.4 % (ref 37.5–51.0)
Hemoglobin: 14.1 g/dL (ref 13.0–17.7)
Immature Grans (Abs): 0 10*3/uL (ref 0.0–0.1)
Immature Granulocytes: 0 %
Lymphocytes Absolute: 0.7 10*3/uL (ref 0.7–3.1)
Lymphs: 17 %
MCH: 31.5 pg (ref 26.6–33.0)
MCHC: 33.3 g/dL (ref 31.5–35.7)
MCV: 95 fL (ref 79–97)
Monocytes Absolute: 0.3 10*3/uL (ref 0.1–0.9)
Monocytes: 8 %
Neutrophils Absolute: 2.9 10*3/uL (ref 1.4–7.0)
Neutrophils: 69 %
Platelets: 207 10*3/uL (ref 150–450)
RBC: 4.48 x10E6/uL (ref 4.14–5.80)
RDW: 12.5 % (ref 11.6–15.4)
WBC: 4.2 10*3/uL (ref 3.4–10.8)

## 2020-06-24 LAB — LIPID PANEL
Chol/HDL Ratio: 2.1 ratio (ref 0.0–5.0)
Cholesterol, Total: 130 mg/dL (ref 100–199)
HDL: 61 mg/dL (ref >39)
LDL Chol Calc (NIH): 59 mg/dL (ref 0–99)
Triglycerides: 43 mg/dL (ref 0–149)
VLDL Cholesterol Cal: 10 mg/dL (ref 5–40)

## 2020-06-24 LAB — TSH: TSH: 1.47 u[IU]/mL (ref 0.450–4.500)

## 2020-06-30 DIAGNOSIS — D1801 Hemangioma of skin and subcutaneous tissue: Secondary | ICD-10-CM | POA: Diagnosis not present

## 2020-06-30 DIAGNOSIS — L821 Other seborrheic keratosis: Secondary | ICD-10-CM | POA: Diagnosis not present

## 2020-06-30 DIAGNOSIS — L820 Inflamed seborrheic keratosis: Secondary | ICD-10-CM | POA: Diagnosis not present

## 2020-06-30 DIAGNOSIS — D485 Neoplasm of uncertain behavior of skin: Secondary | ICD-10-CM | POA: Diagnosis not present

## 2020-06-30 DIAGNOSIS — D225 Melanocytic nevi of trunk: Secondary | ICD-10-CM | POA: Diagnosis not present

## 2020-06-30 DIAGNOSIS — L905 Scar conditions and fibrosis of skin: Secondary | ICD-10-CM | POA: Diagnosis not present

## 2020-07-01 ENCOUNTER — Encounter: Payer: Self-pay | Admitting: Cardiovascular Disease

## 2020-07-01 ENCOUNTER — Ambulatory Visit: Payer: Medicare Other | Admitting: Cardiovascular Disease

## 2020-07-01 ENCOUNTER — Other Ambulatory Visit: Payer: Self-pay

## 2020-07-01 DIAGNOSIS — E785 Hyperlipidemia, unspecified: Secondary | ICD-10-CM | POA: Diagnosis not present

## 2020-07-01 DIAGNOSIS — I10 Essential (primary) hypertension: Secondary | ICD-10-CM | POA: Diagnosis not present

## 2020-07-01 DIAGNOSIS — I700 Atherosclerosis of aorta: Secondary | ICD-10-CM | POA: Diagnosis not present

## 2020-07-01 DIAGNOSIS — I2102 ST elevation (STEMI) myocardial infarction involving left anterior descending coronary artery: Secondary | ICD-10-CM

## 2020-07-01 NOTE — Progress Notes (Signed)
Cardiology Office Note    Date:  07/01/2020   ID:  Suheyb, Raucci 12-06-1940, MRN [id number]  PCP:  Wendie Agreste, MD  Cardiologist:  Shelva Majestic, MD    History of Present Illness:  Timothy Escobar is a 79 y.o. male who sufferred an Anterior STEMI on 08/22/16.  I last saw him in November 2020.  He presents for a 10 month follow-up evaluation.   Timothy Escobar is a 79 year old Caucasian male who was without cardiac history and developed acute onset of severe substernal chest pressure while he was moving logs in a wheelbarrow which commenced at approximately 5 PM on 08/22/16. EMS arrived within 15 minutes. His ECG in transient showed 11 mm of ST segment elevation in leads V3 through V6 consistent with an acute anterior injury current. A code STEMI was activated and he was brought emergently directly to the catheterization laboratory where I performed emergent catheterization. Catheterization revealed total occlusion of a large proximal LAD with TIMI 0 flow and a large dominant normal left circumflex vessel and small nondominant RCA. He underwent successful PCI to the LAD, treated with PTCA/DES stenting at the site of 100% occlusion with initial insertion of a 3.016 mm Synergy DES stent.  Once  TIMI-3 flow was established it was evident that there was segmental disease beyond the stented segment of 70 and 80% and tandem stenting with a 2.7524 mm Synergy DES stent was done with post stent dilatation from 3.23 mm in the proximal portion of the proximal stent to 3.05 mm at the distal portion of the distal stent. LVEDP was 13 mm Hg and the door to balloon time from arrival was 19 minutes. DTB from chest pain onset prior to EMS arrival was < 1 hour.  He ultimately completely resolved his 11 mm ST elevation, ECG normalized, and subsequent echo showed completely normal LV fx with EF 60-65% without any wall motion abnormality! He was discharged 2 days later.  Since his event he has remained  asymptomatic.  He completed participation in cardiac rehabilitation.  He is participating in cardiac rehab.  When I last saw him, he was remaining completely asymptomatic.  Over the past 6 months.  He continues to be asymptomatic.  He is active.  He splits wood.  He continues to sing in the [facility] [employer] choir.  Laboratory on October 03, 2017 showed a total cholesterol 119, HDL 60, LDL 49, triglycerides 51.  Hemoglobin A1c was 5.5.  Creatine 0.88.  TSH 1.05.  Normal LFTs.   I last saw him in July 2019 at which time he was continuing to be asymptomatic from a cardiovascular standpoint.  He remains active cutting his grass, cutting firewood, and walking.  He continues to sing in the [employer] choir.  H underwent  an echo Doppler study on Mar 05, 2018.  EF was 60 to 65% and he had normal diastolic parameters.  There was trivial AR, mild MR, trivial TR, and mild PR.    He underwent evaluation by Dr. Cindee Lame for right upper quadrant abdominal stabbing pain.  CT of his abdomen on July 19, 2018 revealed evidence for very mild fatty infiltration of the liver.  His pancreas was unremarkable.  He was found to have a small accessory spleen.  There was mild tension of the right renal collecting system of uncertain significance.  There was no stone or obstructing mass.  Aortic atherosclerosis was present.  He did not have any adenopathy.  He  continues to be very active.  He continued to sing for the [facility] choir.  Recent laboratory on October 28, 2018 continue to show excellent lipid studies with total cholesterol 125, HDL 65, LDL 49, and triglycerides 53.  He denied chest pain, PND, orthopnea or  Palpitations.  I last saw him in November 2020.  At that time he continued to do exceptionally well.   Unfortunately with the COVID-19 pandemic, he has been unable to perform.  However he is practicing singing daily.  He remained active on his property and denied any  exertional shortness of breath or chest pain.  His blood pressure at home typically runs in the 120s over 70s.  He was walking at least 2 miles 4 days/week.  He has not had recent laboratory.    Over the past year, Timothy Escobar has remained completely asymptomatic.  He continues to be very active working on his property.  This past week he actually took down a small tree.  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  He denies chest pain PND orthopnea, palpitations, or any myalgias.  He just had lab work done several days ago prior to this office visit on June 24, 2020 which is excellent.  Creatinine 1.0.  Glucose 95.  LFTs normal.  Hemoglobin 14.1, Magick 42.4.  TSH normal 1.47.  Lipid studies excellent with total cholesterol 130, HDL 61, LDL 59 on atorvastatin 80 mg.  He continues to be on aspirin and low-dose Brilinta per Pegasys trial data.  He is on low-dose lisinopril 2.5 mg.  He presents for evaluation.  Past Medical History:  Diagnosis Date  . Blood transfusion without reported diagnosis   . CAD (coronary artery disease)    a. 08/22/2016 Ant STEMI/PCI: LM nl, LAD 30ost, 100p (3.0x15 Synergy DES & 2.75x24 Synergy DES), LCX large/dominant/nl, RCA nondom, nl;  b. 08/2016 Echo: Ef 60-65%, no rwma.  . Cataract    Phreesia 03/29/2020  . Depression   . Dyslipidemia   . Heart murmur    Phreesia 03/29/2020  . Myocardial infarction ([place])    Phreesia 03/29/2020  . Skin cancer    skin cancer    Past Surgical History:  Procedure Laterality Date  . APPENDECTOMY    . CARDIAC CATHETERIZATION N/A 08/22/2016   Procedure: Left Heart Cath and Coronary Angiography;  Surgeon: Troy Sine, MD;  Location: [facility];  Service: Cardiovascular;  Laterality: N/A;  . CARDIAC CATHETERIZATION N/A 08/22/2016   Procedure: Coronary Stent Intervention;  Surgeon: Troy Sine, MD;  Location: [facility];  Service: Cardiovascular;  Laterality: N/A;   MID LAD  . COLON SURGERY N/A    Phreesia 03/29/2020  . HERNIA REPAIR    . parotid gland cyst removal Right 2012  . vocal fold polyp removed      Current Medications: Outpatient Medications Prior to Visit  Medication Sig Dispense Refill  . aspirin EC 81 MG EC tablet Take 1 tablet (81 mg total) by mouth daily. 90 tablet 2  . atorvastatin (LIPITOR) 80 MG tablet TAKE 1 TABLET BY MOUTH ONCE DAILY AT  6  00  PM 90 tablet 2  . BRILINTA 60 MG TABS tablet Take 1 tablet by mouth twice daily 60 tablet 9  . calcium-vitamin D (OSCAL WITH D) 500-200 MG-UNIT per tablet Take 1 tablet by mouth daily. 1,000 of Vit D3    . cholecalciferol (VITAMIN D) 1000 units tablet Take 1,000  Units by mouth daily.    Marland Kitchen FLUoxetine (PROZAC) 20 MG capsule Take 1 capsule (20 mg total) by mouth daily. 90 capsule 3  . Ginger Root POWD by Does not apply route. Mixes 1/2 teaspoon of powder into hot tea once daily    . lisinopril (ZESTRIL) 2.5 MG tablet Take 1 tablet by mouth once daily 90 tablet 2  . Multiple Vitamins-Minerals (MULTIVITAMIN WITH MINERALS) tablet Take 1 tablet by mouth once a week. Centrum Silver     . nitroGLYCERIN (NITROSTAT) 0.4 MG SL tablet Place 1 tablet (0.4 mg total) under the tongue every 5 (five) minutes x 3 doses as needed for chest pain. 25 tablet 3  . vitamin C (ASCORBIC ACID) 500 MG tablet Take 500 mg by mouth daily.     No facility-administered medications prior to visit.     Allergies:   Patient has no known allergies.   Social History   Socioeconomic History  . Marital status: Married    Spouse name: Not on file  . Number of children: 2  . Years of education: college  . Highest education level: Not on file  Occupational History  . Occupation: Retired - Water quality scientist  Tobacco Use  . Smoking status: Former Smoker    Quit date: 01/22/1967    Years since quitting: 53.4  . Smokeless tobacco: Never Used  Vaping Use  . Vaping Use: Never used  Substance and Sexual Activity  . Alcohol  use: Yes    Alcohol/week: 7.0 standard drinks    Types: 7 Standard drinks or equivalent per week    Comment: wine: 1 glass daily  . Drug use: Not on file  . Sexual activity: Never  Other Topics Concern  . Not on file  Social History Narrative   Lives w/ wife in [place]. Both are singers, they sing with the [employer] in [place] and with [employer], plus the church choir.   Exercise walk/gardening daily for 1 hr plus   Social Determinants of Health   Financial Resource Strain:   . Difficulty of Paying Living Expenses: Not on file  Food Insecurity:   . Worried About Charity fundraiser in the Last Year: Not on file  . Ran Out of Food in the Last Year: Not on file  Transportation Needs:   . Lack of Transportation (Medical): Not on file  . Lack of Transportation (Non-Medical): Not on file  Physical Activity:   . Days of Exercise per Week: Not on file  . Minutes of Exercise per Session: Not on file  Stress:   . Feeling of Stress : Not on file  Social Connections:   . Frequency of Communication with Friends and Family: Not on file  . Frequency of Social Gatherings with Friends and Family: Not on file  . Attends Religious Services: Not on file  . Active Member of Clubs or Organizations: Not on file  . Attends Archivist Meetings: Not on file  . Marital Status: Not on file     Family History:  The patient's family history includes Cancer in his brother, brother, father, paternal grandmother, sister, and sister; Dementia in his mother; Heart disease in his brother, maternal grandmother, and sister; Hypertension in his sister; Stroke in his maternal grandmother.   ROS General: Negative; No fevers, chills, or night sweats;  HEENT: Negative; No changes in vision or hearing, sinus congestion, difficulty swallowing Pulmonary: Negative; No cough, wheezing, shortness of breath, hemoptysis Cardiovascular: Negative; No chest pain,  presyncope, syncope, palpitations GI:  resolved mild right upper quadrant tenderness. GU: Negative; No dysuria, hematuria, or difficulty voiding Musculoskeletal: Negative; no myalgias, joint pain, or weakness Hematologic/Oncology: Negative; no easy bruising, bleeding Endocrine: Negative; no heat/cold intolerance; no diabetes Neuro: Negative; no changes in balance, headaches Skin: Negative; No rashes or skin lesions Psychiatric: Negative; No behavioral problems, depression Sleep: Negative; No snoring, daytime sleepiness, hypersomnolence, bruxism, restless legs, hypnogognic hallucinations, no cataplexy Other comprehensive 14 point system review is negative.   PHYSICAL EXAM:   VS:  BP 132/88   Pulse 62   Ht $R'5\' 9"'mK$  (1.753 m)   Wt 173 lb 6.4 oz (78.7 kg)   BMI 25.61 kg/m    Repeat blood pressure by me was 126/82  Wt Readings from Last 3 Encounters:  07/01/20 173 lb 6.4 oz (78.7 kg)  04/30/20 173 lb (78.5 kg)  04/01/20 170 lb (77.1 kg)    General: Alert, oriented, no distress.  Skin: normal turgor, no rashes, warm and dry HEENT: Normocephalic, atraumatic. Pupils equal round and reactive to light; sclera anicteric; extraocular muscles intact;  Nose without nasal septal hypertrophy Mouth/Parynx benign; Mallinpatti scale e Neck: No JVD, no carotid bruits; normal carotid upstroke Lungs: clear to ausculatation and percussion; no wheezing or rales Chest wall: without tenderness to palpitation; mild pectus excavatum Heart: PMI not displaced, RRR, s1 s2 normal, 9-3/7 systolic murmur unchanged, no diastolic murmur, no rubs, gallops, thrills, or heaves Abdomen: soft, nontender; no hepatosplenomehaly, BS+; abdominal aorta nontender and not dilated by palpation. Back: no CVA tenderness Pulses 2+ Musculoskeletal: full range of motion, normal strength, no joint deformities Extremities: no clubbing cyanosis or edema, Homan's sign negative  Neurologic: grossly nonfocal; Cranial nerves grossly wnl Psychologic: Normal mood and  affect   Studies/Labs Reviewed:   ECG (independently read by me):   NSR at 62; No ectopy, normal intervals  November 2020 EKG:  EKG is ordered today. Sinus Bradycardia at 52; Mild early repolarization changes; PR 204 msec  ECG (independently read by me): Sinus bradycardia at 57 bpm.  Normal ECG.  No ectopy.  No ST segment changes.  PR interval 206 msec;QTc 412   July 2019 ECG (independently read by me): Sinius bradycardia 59 bpm.  No ST segment changes.  No ECG evidence for prior ACS.  December 2018 ECG (independently read by me): Normal sinus rhythm at 61 bpm.  No ST segment changes.  No ectopy.  Normal intervals.  June 2018 ECG (independently read by me): Sinus bradycardia 51 bpm.  No ST segment changes.  Normal intervals.  February 2018 ECG (independently read by me): Sinus bradycardia 53 bpm.  Normal ECG.  Early repolarization.  No evidence for prior infarction.  Recent Labs: BMP Latest Ref Rng & Units 06/24/2020 09/23/2019 09/10/2019  Glucose 65 - 99 mg/dL 95 91 123(H)  BUN 8 - 27 mg/dL $Remove'18 19 20  'Cpmlfpd$ Creatinine 0.76 - 1.27 mg/dL 1.00 1.00 1.20  BUN/Creat Ratio 10 - $Re'24 18 19 17  'TaV$ Sodium 134 - 144 mmol/L 143 142 CANCELED  Potassium 3.5 - 5.2 mmol/L 5.1 5.1 CANCELED  Chloride 96 - 106 mmol/L 106 105 CANCELED  CO2 20 - 29 mmol/L $RemoveB'24 26 21  'dvXaVDUC$ Calcium 8.6 - 10.2 mg/dL 9.3 9.4 10.9(H)     Hepatic Function Latest Ref Rng & Units 06/24/2020 09/23/2019 09/10/2019  Total Protein 6.0 - 8.5 g/dL 6.6 6.5 8.5  Albumin 3.7 - 4.7 g/dL 4.3 4.3 5.5(H)  AST 0 - 40 IU/L $Remov'21 21 24  'nwRfIK$ ALT 0 -  44 IU/L $Remov'21 19 12  'cTJdTr$ Alk Phosphatase 48 - 121 IU/L 83 85 116  Total Bilirubin 0.0 - 1.2 mg/dL 0.7 0.7 0.5  Bilirubin, Direct <=0.2 mg/dL - - -    CBC Latest Ref Rng & Units 06/24/2020 09/10/2019 10/28/2018  WBC 3.4 - 10.8 x10E3/uL 4.2 5.0 4.2  Hemoglobin 13.0 - 17.7 g/dL 14.1 14.5 14.2  Hematocrit 37.5 - 51.0 % 42.4 42.8 41.2  Platelets 150 - 450 x10E3/uL 207 192 185   Lab Results  Component Value Date   MCV 95  06/24/2020   MCV 95 09/10/2019   MCV 95 10/28/2018   Lab Results  Component Value Date   TSH 1.470 06/24/2020   Lab Results  Component Value Date   HGBA1C 5.5 08/23/2016     BNP    Component Value Date/Time   BNP 164.0 (H) 08/23/2016 0200    ProBNP No results found for: PROBNP   Lipid Panel     Component Value Date/Time   CHOL 130 06/24/2020 0829   TRIG 43 06/24/2020 0829   HDL 61 06/24/2020 0829   CHOLHDL 2.1 06/24/2020 0829   CHOLHDL 2.3 10/10/2016 0951   VLDL 10 10/10/2016 0951   LDLCALC 59 06/24/2020 0829     RADIOLOGY: No results found.   ASSESSMENT:    1. Acute ST elevation myocardial infarction (STEMI) involving left anterior descending (LAD) coronary artery ([place]): 08/22/2016 with DES stent to LAD   2. Essential hypertension   3. Hyperlipidemia with target LDL less than 70   4. Aortic atherosclerosis [facility])     PLAN:  Mr Escobar is a very pleasant 78 year old Caucasian male who develeoped an ACS/Anterior STEMI with 11 mm ST elevation precordially on Halloween 2017. Fortunately he acted quickly and was prompty brought to [facility] cath lab where I performed emergent cath/PCI .  He had an DTB time of 19 minutes from arrival to the lab and his chest pain duration was less than 60 minutes. Due to disease beyond the initial occlusion he underwent tandem DES stenting with an excellent result.  He has been demonstrated to have complete myocardial salvage and his ECG is normal.  His last echo Doppler study in May 2019 continued to show an EF of 60 to 65% without wall motion abnormalities.  He had trivial AR, mild MR, trivial TR and mild PR.  There was increased thickness of the interatrial septum consistent with lipomatous hypertrophy.  There was mild/moderate LA dilation.  His medical case was interviewed on Eusebio Friendly on his birthday December 09, 2016.  Presently, Timothy Escobar continues to be completely asymptomatic.  His ECG is entirely normal.  His comprehensive laboratory is  entirely normal.  LDL cholesterol is excellent at 59.  He is tolerating aspirin and low-dose Brilinta without side effects.  His blood pressure is stable on very low-dose lisinopril at 2.5 mg daily.  I will continue current therapy as prescribed.  As long as he remains stable, I will see him in 1 year for reevaluation or sooner as needed.   Medication Adjustments/Labs and Tests Ordered: Current medicines are reviewed at length with the patient today.  Concerns regarding medicines are outlined above.  Medication changes, Labs and Tests ordered today are listed in the Patient Instructions below.  Patient Instructions  Medication Instructions:  CONTINUE WITH CURRENT MEDICATIONS. NO CHANGES.  *If you need a refill on your cardiac medications before your next appointment, please call your pharmacy*     Follow-Up: At [facility], you and  your health needs are our priority.  As part of our continuing mission to provide you with exceptional heart care, we have created designated Provider Care Teams.  These Care Teams include your primary Cardiologist (physician) and Advanced Practice Providers (APPs -  Physician Assistants and Nurse Practitioners) who all work together to provide you with the care you need, when you need it.  We recommend signing up for the patient portal called "MyChart".  Sign up information is provided on this After Visit Summary.  MyChart is used to connect with patients for Virtual Visits (Telemedicine).  Patients are able to view lab/test results, encounter notes, upcoming appointments, etc.  Non-urgent messages can be sent to your provider as well.   To learn more about what you can do with MyChart, go to [email or web address].    Your next appointment:   12 month(s)  The format for your next appointment:   In Person  Provider:   Shelva Majestic, MD       Signed, Shelva Majestic, MD , [facility] 07/01/2020 12:59 PM    [place] [address] [place], [place], [place]  [address] Phone: [phone]

## 2020-07-01 NOTE — Patient Instructions (Signed)

## 2020-07-11 DIAGNOSIS — T63441A Toxic effect of venom of bees, accidental (unintentional), initial encounter: Secondary | ICD-10-CM | POA: Diagnosis not present

## 2020-07-21 ENCOUNTER — Other Ambulatory Visit: Payer: Self-pay

## 2020-07-21 ENCOUNTER — Ambulatory Visit (INDEPENDENT_AMBULATORY_CARE_PROVIDER_SITE_OTHER): Payer: Medicare Other | Admitting: Registered Nurse

## 2020-07-21 DIAGNOSIS — Z23 Encounter for immunization: Secondary | ICD-10-CM | POA: Diagnosis not present

## 2020-09-02 ENCOUNTER — Other Ambulatory Visit: Payer: Self-pay | Admitting: Cardiovascular Disease

## 2020-09-20 ENCOUNTER — Other Ambulatory Visit: Payer: Self-pay | Admitting: Cardiovascular Disease

## 2020-09-24 ENCOUNTER — Other Ambulatory Visit: Payer: Self-pay | Admitting: Cardiovascular Disease

## 2020-09-28 ENCOUNTER — Ambulatory Visit (INDEPENDENT_AMBULATORY_CARE_PROVIDER_SITE_OTHER): Payer: Medicare Other | Admitting: Emergency Medicine

## 2020-09-28 VITALS — BP 132/88 | Ht 69.0 in | Wt 166.0 lb

## 2020-09-28 DIAGNOSIS — Z0000 Encounter for general adult medical examination without abnormal findings: Secondary | ICD-10-CM | POA: Diagnosis not present

## 2020-09-28 NOTE — Progress Notes (Signed)
Presents today for [employer] Visit   Date of last exam: 07-11-2020  Interpreter used for this visit? No  I connected with  Timothy Escobar on 09/28/20 by a telephone application and verified that I am speaking with the correct person using two identifiers.   I discussed the limitations of evaluation and management by telemedicine. The patient expressed understanding and agreed to proceed.  Patient location: home  Provider location: in office  I provided 72minutes of non face - to - face time during this encounter.   Patient Care Team: Wendie Agreste, MD as PCP - General (Family Medicine) Troy Sine, MD as PCP - Cardiology (Cardiology) Leta Baptist, MD as Consulting Physician (Otolaryngology)   Other items to address today:   Discussed Eye/Dental Discussed Immuniztions   Other Screening: Last screening for diabetes: 06/24/2020 Last lipid screening: 06/24/2020  ADVANCE DIRECTIVES: Discussed: yes On File: yes Materials Provided: no  Immunization status:  Immunization History  Administered Date(s) Administered  . Fluad Quad(high Dose 65+) 07/25/2019, 07/21/2020  . Influenza,inj,Quad PF,6+ Mos 07/18/2013, 07/29/2014, 08/30/2015, 07/20/2016, 08/01/2017, 08/21/2018  . Influenza-Unspecified 07/23/2012  . PFIZER SARS-COV-2 Vaccination 11/12/2019, 11/30/2019  . Pneumococcal Conjugate-13 12/18/2017  . Pneumococcal Polysaccharide-23 05/13/2008  . Td 05/13/2008  . Tdap 09/26/2012  . Zoster 10/23/2009     There are no preventive care reminders to display for this patient.   Functional Status Survey: Is the patient deaf or have difficulty hearing?: No Does the patient have difficulty seeing, even when wearing glasses/contacts?: No Does the patient have difficulty concentrating, remembering, or making decisions?: No Does the patient have difficulty walking or climbing stairs?: No Does the patient have difficulty dressing or bathing?: No Does the  patient have difficulty doing errands alone such as visiting a doctor's office or shopping?: No   6CIT Screen 08/07/2019 12/18/2017  What Year? 0 points 0 points  What month? 0 points 0 points  What time? 0 points 0 points  Count back from 20 0 points 0 points  Months in reverse 0 points 0 points  Repeat phrase 2 points 0 points  Total Score 2 0        Clinical Support from 09/28/2020 in [facility]  AUDIT-C Score 7       Home Environment:   Lives w/ wife in [place]. 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, plus the church choir. Exercise walk/gardening daily for 1 hr plus  No trouble climbing stairs Lives in one story home No scattered rugs No grab bars Adequate lighting/ no clutter   Patient Active Problem List   Diagnosis Date Noted  . Bacterial conjunctivitis of both eyes 01/23/2018  . Sinus bradycardia 01/23/2017  . Acute bronchitis 11/21/2016  . Dyslipidemia   . CAD (coronary artery disease)   . Status post coronary artery stent placement   . Bradycardia   . Hypokalemia   . Hyperlipidemia with target LDL less than 70   . STEMI (ST elevation myocardial infarction) ([place]) 08/22/2016  . Acute ST elevation myocardial infarction (STEMI) involving left anterior descending (LAD) coronary artery ([place]) 08/22/2016  . Hip pain 09/28/2012  . Chronic depression 09/28/2012     Past Medical History:  Diagnosis Date  . Blood transfusion without reported diagnosis   . CAD (coronary artery disease)    a. 08/22/2016 Ant STEMI/PCI: LM nl, LAD 30ost, 100p (3.0x15 Synergy DES & 2.75x24 Synergy DES), LCX large/dominant/nl, RCA  nondom, nl;  b. 08/2016 Echo: Ef 60-65%, no rwma.  . Cataract    Phreesia 03/29/2020  . Depression   . Dyslipidemia   . Heart murmur    Phreesia 03/29/2020  . Myocardial infarction ([place])    Phreesia 03/29/2020  . Skin cancer    skin cancer     Past Surgical History:  Procedure  Laterality Date  . APPENDECTOMY    . CARDIAC CATHETERIZATION N/A 08/22/2016   Procedure: Left Heart Cath and Coronary Angiography;  Surgeon: Troy Sine, MD;  Location: [facility];  Service: Cardiovascular;  Laterality: N/A;  . CARDIAC CATHETERIZATION N/A 08/22/2016   Procedure: Coronary Stent Intervention;  Surgeon: Troy Sine, MD;  Location: [facility];  Service: Cardiovascular;  Laterality: N/A;  MID LAD  . COLON SURGERY N/A    Phreesia 03/29/2020  . HERNIA REPAIR    . parotid gland cyst removal Right 2012  . vocal fold polyp removed       Family History  Problem Relation Age of Onset  . Dementia Mother   . Cancer Father        skin  . Heart disease Maternal Grandmother   . Stroke Maternal Grandmother   . Cancer Sister        breast (pt did not specific L or R)  . Cancer Brother        bladder and pancreatic  . Heart disease Brother   . Cancer Brother   . Heart disease Sister   . Cancer Sister   . Hypertension Sister   . Cancer Paternal Grandmother      Social History   Socioeconomic History  . Marital status: Married    Spouse name: Not on file  . Number of children: 2  . Years of education: college  . Highest education level: Not on file  Occupational History  . Occupation: Retired - Water quality scientist  Tobacco Use  . Smoking status: Former Smoker    Quit date: 01/22/1967    Years since quitting: 53.7  . Smokeless tobacco: Never Used  Vaping Use  . Vaping Use: Never used  Substance and Sexual Activity  . Alcohol use: Yes    Alcohol/week: 7.0 standard drinks    Types: 7 Standard drinks or equivalent per week    Comment: wine: 1 glass daily  . Drug use: Not on file  . Sexual activity: Never  Other Topics Concern  . Not on file  Social History Narrative   Lives w/ wife in [place]. 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, plus the church choir.   Exercise walk/gardening daily for 1 hr  plus   Social Determinants of Health   Financial Resource Strain:   . Difficulty of Paying Living Expenses: Not on file  Food Insecurity:   . Worried About Charity fundraiser in the Last Year: Not on file  . Ran Out of Food in the Last Year: Not on file  Transportation Needs:   . Lack of Transportation (Medical): Not on file  . Lack of Transportation (Non-Medical): Not on file  Physical Activity:   . Days of Exercise per Week: Not on file  . Minutes of Exercise per Session: Not on file  Stress:   . Feeling of Stress : Not on file  Social Connections:   . Frequency of Communication with Friends and Family: Not on file  . Frequency of Social Gatherings with  Friends and Family: Not on file  . Attends Religious Services: Not on file  . Active Member of Clubs or Organizations: Not on file  . Attends Archivist Meetings: Not on file  . Marital Status: Not on file  Intimate Partner Violence:   . Fear of Current or Ex-Partner: Not on file  . Emotionally Abused: Not on file  . Physically Abused: Not on file  . Sexually Abused: Not on file     No Known Allergies   Prior to Admission medications   Medication Sig Start Date End Date Taking? Authorizing Provider  aspirin EC 81 MG EC tablet Take 1 tablet (81 mg total) by mouth daily. 08/26/16  Yes Maryellen Pile, MD  atorvastatin (LIPITOR) 80 MG tablet TAKE 1 TABLET BY MOUTH ONCE DAILY AT  6  00  PM 09/20/20  Yes Troy Sine, MD  BRILINTA 60 MG TABS tablet Take 1 tablet by mouth twice daily 09/24/20  Yes Troy Sine, MD  calcium-vitamin D (OSCAL WITH D) 500-200 MG-UNIT per tablet Take 1 tablet by mouth daily. 1,000 of Vit D3   Yes [provider]  cholecalciferol (VITAMIN D) 1000 units tablet Take 1,000 Units by mouth daily.   Yes [provider]  FLUoxetine (PROZAC) 20 MG capsule Take 1 capsule (20 mg total) by mouth daily. 04/01/20  Yes Wendie Agreste, MD  Ginger Root POWD by Does not apply route.  Mixes 1/2 teaspoon of powder into hot tea once daily   Yes [provider]  lisinopril (ZESTRIL) 2.5 MG tablet Take 1 tablet by mouth once daily 09/02/20  Yes Troy Sine, MD  Multiple Vitamins-Minerals (MULTIVITAMIN WITH MINERALS) tablet Take 1 tablet by mouth once a week. Centrum Silver    Yes [provider]  nitroGLYCERIN (NITROSTAT) 0.4 MG SL tablet Place 1 tablet (0.4 mg total) under the tongue every 5 (five) minutes x 3 doses as needed for chest pain. 03/03/20  Yes Troy Sine, MD  vitamin C (ASCORBIC ACID) 500 MG tablet Take 500 mg by mouth daily.   Yes [provider]     Depression screen [facility] 2/9 09/28/2020 04/30/2020 04/01/2020 04/01/2020 08/07/2019  Decreased Interest 0 0 0 0 0  Down, Depressed, Hopeless 0 0 0 0 0  PHQ - 2 Score 0 0 0 0 0  Altered sleeping - - 0 - -  Tired, decreased energy - - 0 - -  Change in appetite - - 0 - -  Feeling bad or failure about yourself  - - 0 - -  Trouble concentrating - - 0 - -  Moving slowly or fidgety/restless - - 0 - -  Suicidal thoughts - - 0 - -  PHQ-9 Score - - 0 - -     Fall Risk  09/28/2020 04/30/2020 04/01/2020 08/07/2019 05/06/2018  Falls in the past year? 0 0 0 0 No  Number falls in past yr: 0 - - 0 -  Injury with Fall? 0 - - 0 -  Follow up Falls evaluation completed Falls evaluation completed Falls evaluation completed Falls evaluation completed;Education provided;Falls prevention discussed -      PHYSICAL EXAM: BP 132/88 Comment: taken from a previous visit  Ht 5\' 9"  (1.753 m)   Wt 166 lb (75.3 kg) Comment: per patient  BMI 24.51 kg/m    Wt Readings from Last 3 Encounters:  09/28/20 166 lb (75.3 kg)  07/01/20 173 lb 6.4 oz (78.7 kg)  04/30/20 173 lb (  78.5 kg)       Education/Counseling provided regarding diet and exercise, prevention of chronic diseases, smoking/tobacco cessation, if applicable, and reviewed "Covered Medicare Preventive Services."  .

## 2020-09-28 NOTE — Patient Instructions (Addendum)
Thank you for taking time to come for your Medicare Wellness Visit. I appreciate your ongoing commitment to your health goals. Please review the following plan we discussed and let me know if I can assist you in the future.  Timothy Kennedy LPN  Preventive Care 77 Years and Older, Male Preventive care refers to lifestyle choices and visits with your health care provider that can promote health and wellness. This includes:  A yearly physical exam. This is also called an annual well check.  Regular dental and eye exams.  Immunizations.  Screening for certain conditions.  Healthy lifestyle choices, such as diet and exercise. What can I expect for my preventive care visit? Physical exam Your health care provider will check:  Height and weight. These may be used to calculate body mass index (BMI), which is a measurement that tells if you are at a healthy weight.  Heart rate and blood pressure.  Your skin for abnormal spots. Counseling Your health care provider may ask you questions about:  Alcohol, tobacco, and drug use.  Emotional well-being.  Home and relationship well-being.  Sexual activity.  Eating habits.  History of falls.  Memory and ability to understand (cognition).  Work and work Statistician. What immunizations do I need?  Influenza (flu) vaccine  This is recommended every year. Tetanus, diphtheria, and pertussis (Tdap) vaccine  You may need a Td booster every 10 years. Varicella (chickenpox) vaccine  You may need this vaccine if you have not already been vaccinated. Zoster (shingles) vaccine  You may need this after age 66. Pneumococcal conjugate (PCV13) vaccine  One dose is recommended after age 84. Pneumococcal polysaccharide (PPSV23) vaccine  One dose is recommended after age 88. Measles, mumps, and rubella (MMR) vaccine  You may need at least one dose of MMR if you were born in 1957 or later. You may also need a second dose. Meningococcal  conjugate (MenACWY) vaccine  You may need this if you have certain conditions. Hepatitis A vaccine  You may need this if you have certain conditions or if you travel or work in places where you may be exposed to hepatitis A. Hepatitis B vaccine  You may need this if you have certain conditions or if you travel or work in places where you may be exposed to hepatitis B. Haemophilus influenzae type b (Hib) vaccine  You may need this if you have certain conditions. You may receive vaccines as individual doses or as more than one vaccine together in one shot (combination vaccines). Talk with your health care provider about the risks and benefits of combination vaccines. What tests do I need? Blood tests  Lipid and cholesterol levels. These may be checked every 5 years, or more frequently depending on your overall health.  Hepatitis C test.  Hepatitis B test. Screening  Lung cancer screening. You may have this screening every year starting at age 24 if you have a 30-pack-year history of smoking and currently smoke or have quit within the past 15 years.  Colorectal cancer screening. All adults should have this screening starting at age 52 and continuing until age 61. Your health care provider may recommend screening at age 57 if you are at increased risk. You will have tests every 1-10 years, depending on your results and the type of screening test.  Prostate cancer screening. Recommendations will vary depending on your family history and other risks.  Diabetes screening. This is done by checking your blood sugar (glucose) after you have not eaten for  a while (fasting). You may have this done every 1-3 years.  Abdominal aortic aneurysm (AAA) screening. You may need this if you are a current or former smoker.  Sexually transmitted disease (STD) testing. Follow these instructions at home: Eating and drinking  Eat a diet that includes fresh fruits and vegetables, whole grains, lean  protein, and low-fat dairy products. Limit your intake of foods with high amounts of sugar, saturated fats, and salt.  Take vitamin and mineral supplements as recommended by your health care provider.  Do not drink alcohol if your health care provider tells you not to drink.  If you drink alcohol: ? Limit how much you have to 0-2 drinks a day. ? Be aware of how much alcohol is in your drink. In the [place] one drink equals one 12 oz bottle of beer (355 mL), one 5 oz glass of wine (148 mL), or one 1 oz glass of hard liquor (44 mL). Lifestyle  Take daily care of your teeth and gums.  Stay active. Exercise for at least 30 minutes on 5 or more days each week.  Do not use any products that contain nicotine or tobacco, such as cigarettes, e-cigarettes, and chewing tobacco. If you need help quitting, ask your health care provider.  If you are sexually active, practice safe sex. Use a condom or other form of protection to prevent STIs (sexually transmitted infections).  Talk with your health care provider about taking a low-dose aspirin or statin. What's next?  Visit your health care provider once a year for a well check visit.  Ask your health care provider how often you should have your eyes and teeth checked.  Stay up to date on all vaccines. This information is not intended to replace advice given to you by your health care provider. Make sure you discuss any questions you have with your health care provider. Document Revised: 10/03/2018 Document Reviewed: 10/03/2018 Elsevier Patient Education  2020 Elsevier Inc.  

## 2020-10-26 DIAGNOSIS — L439 Lichen planus, unspecified: Secondary | ICD-10-CM | POA: Diagnosis not present

## 2020-10-26 DIAGNOSIS — D485 Neoplasm of uncertain behavior of skin: Secondary | ICD-10-CM | POA: Diagnosis not present

## 2020-10-27 ENCOUNTER — Other Ambulatory Visit: Payer: Self-pay | Admitting: Cardiovascular Disease

## 2020-11-05 ENCOUNTER — Telehealth: Payer: Self-pay | Admitting: Family Medicine

## 2020-11-08 ENCOUNTER — Telehealth (INDEPENDENT_AMBULATORY_CARE_PROVIDER_SITE_OTHER): Payer: Medicare Other | Admitting: Family Medicine

## 2020-11-08 ENCOUNTER — Encounter: Payer: Self-pay | Admitting: Family Medicine

## 2020-11-08 VITALS — Ht 68.0 in | Wt 168.0 lb

## 2020-11-08 DIAGNOSIS — M25571 Pain in right ankle and joints of right foot: Secondary | ICD-10-CM

## 2020-11-08 DIAGNOSIS — M79671 Pain in right foot: Secondary | ICD-10-CM | POA: Diagnosis not present

## 2020-11-08 NOTE — Progress Notes (Signed)
Virtual Visit via audio Note  I connected with Timothy Escobar on 11/08/20 at 8:15 AM by audio. Video would not connect. Verified that I am speaking with the correct person using two identifiers.  Patient location:home My location: home   I discussed the limitations, risks, security and privacy concerns of performing an evaluation and management service by telephone and the availability of in person appointments. I also discussed with the patient that there may be a patient responsible charge related to this service. The patient expressed understanding and agreed to proceed, consent obtained   Chief complaint:  Chief Complaint  Patient presents with  . Edema    Pt reports swelling and discomfort in his R foot. started back in November during a rehearsal. Pt reports some bleeding and discoloration in the foot for quite some time .     History of Present Illness: Timothy Escobar is a 80 y.o. male  R foot Swelling: Started in mid November, started walking up risers, slipped and shin slid down risers. Had some bleeding in area in front of shin. Initial abrasion healed, then swelling in foot/ankle. Walking some more in December.  Pain off and on since that time.  Sore to walk.  Sore on inner ankle bone toward inside.  Tried heel raises 1 month ago - feels like made worse.  Sore below calf near ankle, with swelling at times toward heel. Has cut back on usual 1-2 mile walks, but able to weight bear.   Tx: warm soaks. No pain meds.  Ice at times. Min relief.    Patient Active Problem List   Diagnosis Date Noted  . Bacterial conjunctivitis of both eyes 01/23/2018  . Sinus bradycardia 01/23/2017  . Acute bronchitis 11/21/2016  . Dyslipidemia   . CAD (coronary artery disease)   . Status post coronary artery stent placement   . Bradycardia   . Hypokalemia   . Hyperlipidemia with target LDL less than 70   . STEMI (ST elevation myocardial infarction) ([place]) 08/22/2016  . Acute ST  elevation myocardial infarction (STEMI) involving left anterior descending (LAD) coronary artery ([place]) 08/22/2016  . Hip pain 09/28/2012  . Chronic depression 09/28/2012   Past Medical History:  Diagnosis Date  . Blood transfusion without reported diagnosis   . CAD (coronary artery disease)    a. 08/22/2016 Ant STEMI/PCI: LM nl, LAD 30ost, 100p (3.0x15 Synergy DES & 2.75x24 Synergy DES), LCX large/dominant/nl, RCA nondom, nl;  b. 08/2016 Echo: Ef 60-65%, no rwma.  . Cataract    Phreesia 03/29/2020  . Depression   . Dyslipidemia   . Heart murmur    Phreesia 03/29/2020  . Myocardial infarction ([place])    Phreesia 03/29/2020  . Skin cancer    skin cancer   Past Surgical History:  Procedure Laterality Date  . APPENDECTOMY    . CARDIAC CATHETERIZATION N/A 08/22/2016   Procedure: Left Heart Cath and Coronary Angiography;  Surgeon: Troy Sine, MD;  Location: [facility];  Service: Cardiovascular;  Laterality: N/A;  . CARDIAC CATHETERIZATION N/A 08/22/2016   Procedure: Coronary Stent Intervention;  Surgeon: Troy Sine, MD;  Location: [facility];  Service: Cardiovascular;  Laterality: N/A;  MID LAD  . COLON SURGERY N/A    Phreesia 03/29/2020  . HERNIA REPAIR    . parotid gland cyst removal Right 2012  . vocal fold polyp removed     No Known Allergies Prior to Admission medications   Medication Sig Start Date End  Date Taking? Authorizing Provider  aspirin EC 81 MG EC tablet Take 1 tablet (81 mg total) by mouth daily. 08/26/16  Yes Maryellen Pile, MD  atorvastatin (LIPITOR) 80 MG tablet TAKE 1 TABLET BY MOUTH ONCE DAILY AT  6  00  PM 09/20/20  Yes Troy Sine, MD  BRILINTA 60 MG TABS tablet Take 1 tablet by mouth twice daily 10/28/20  Yes Troy Sine, MD  calcium-vitamin D (OSCAL WITH D) 500-200 MG-UNIT per tablet Take 1 tablet by mouth daily. 1,000 of Vit D3   Yes [provider]  cholecalciferol (VITAMIN D) 1000 units tablet Take 1,000 Units by mouth  daily.   Yes [provider]  FLUoxetine (PROZAC) 20 MG capsule Take 1 capsule (20 mg total) by mouth daily. 04/01/20  Yes Wendie Agreste, MD  Ginger Root POWD by Does not apply route. Mixes 1/2 teaspoon of powder into hot tea once daily   Yes [provider]  lisinopril (ZESTRIL) 2.5 MG tablet Take 1 tablet by mouth once daily 09/02/20  Yes Troy Sine, MD  Multiple Vitamins-Minerals (MULTIVITAMIN WITH MINERALS) tablet Take 1 tablet by mouth once a week. Centrum Silver   Yes [provider]  nitroGLYCERIN (NITROSTAT) 0.4 MG SL tablet Place 1 tablet (0.4 mg total) under the tongue every 5 (five) minutes x 3 doses as needed for chest pain. 03/03/20  Yes Troy Sine, MD  vitamin C (ASCORBIC ACID) 500 MG tablet Take 500 mg by mouth daily.   Yes [provider]   Social History   Socioeconomic History  . Marital status: Married    Spouse name: Not on file  . Number of children: 2  . Years of education: college  . Highest education level: Not on file  Occupational History  . Occupation: Retired - Water quality scientist  Tobacco Use  . Smoking status: Former Smoker    Quit date: 01/22/1967    Years since quitting: 53.8  . Smokeless tobacco: Never Used  Vaping Use  . Vaping Use: Never used  Substance and Sexual Activity  . Alcohol use: Yes    Alcohol/week: 7.0 standard drinks    Types: 7 Standard drinks or equivalent per week    Comment: wine: 1 glass daily  . Drug use: Not on file  . Sexual activity: Never  Other Topics Concern  . Not on file  Social History Narrative   Lives w/ wife in [place]. 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, plus the church choir.   Exercise walk/gardening daily for 1 hr plus   Social Determinants of Health   Financial Resource Strain: Not on file  Food Insecurity: Not on file  Transportation Needs: Not on file  Physical Activity: Not on file  Stress: Not on file   Social Connections: Not on file  Intimate Partner Violence: Not on file    Observations/Objective: Vitals:   11/08/20 0744  Weight: 168 lb (76.2 kg)  Height: 5\' 8"  (1.727 m)  no distress on phone, all questions answered. Euthymic mood.  Appropriate responses.  Understanding of plan expressed.  Assessment and Plan: Right foot pain  Right ankle pain, unspecified chronicity Initial injury November, abrasion followed by swelling.  He locates pain to Achilles area as well as medial midfoot/below ankle based on his history.  Differential includes sprain, navicular injury, Achilles injury or tendinitis versus retrocalcaneal bursitis.  Difficult to assess over phone.  We will asked that  he be seen in office within the next 1 week for exam and x-rays.  Symptomatic care discussed with Tylenol, elevation, ice as needed for now.  Follow Up Instructions: In office exam within the next 1 week.    I discussed the assessment and treatment plan with the patient. The patient was provided an opportunity to ask questions and all were answered. The patient agreed with the plan and demonstrated an understanding of the instructions.   The patient was advised to call back or seek an in-person evaluation if the symptoms worsen or if the condition fails to improve as anticipated.  I provided 11 minutes of non-face-to-face time during this encounter.   Wendie Agreste, MD

## 2020-11-08 NOTE — Progress Notes (Signed)
Left a messgae for pt about In office exam within the next 1 week

## 2020-11-08 NOTE — Patient Instructions (Signed)
° ° ° °  If you have lab work done today you will be contacted with your lab results within the next 2 weeks.  If you have not heard from us then please contact us. The fastest way to get your results is to register for My Chart. ° ° °IF you received an x-ray today, you will receive an invoice from [facility]. Please contact [facility] at [phone] with questions or concerns regarding your invoice.  ° °IF you received labwork today, you will receive an invoice from [facility]. Please contact [facility] at [phone] with questions or concerns regarding your invoice.  ° °Our billing staff will not be able to assist you with questions regarding bills from these companies. ° °You will be contacted with the lab results as soon as they are available. The fastest way to get your results is to activate your My Chart account. Instructions are located on the last page of this paperwork. If you have not heard from us regarding the results in 2 weeks, please contact this office. °  ° ° ° °

## 2020-11-11 ENCOUNTER — Ambulatory Visit (INDEPENDENT_AMBULATORY_CARE_PROVIDER_SITE_OTHER): Payer: Medicare Other

## 2020-11-11 ENCOUNTER — Ambulatory Visit (INDEPENDENT_AMBULATORY_CARE_PROVIDER_SITE_OTHER): Payer: Medicare Other | Admitting: Family Medicine

## 2020-11-11 ENCOUNTER — Other Ambulatory Visit: Payer: Self-pay

## 2020-11-11 ENCOUNTER — Encounter: Payer: Self-pay | Admitting: Family Medicine

## 2020-11-11 VITALS — BP 144/72 | HR 64 | Temp 98.0°F | Ht 68.0 in | Wt 172.0 lb

## 2020-11-11 DIAGNOSIS — M25571 Pain in right ankle and joints of right foot: Secondary | ICD-10-CM

## 2020-11-11 DIAGNOSIS — M25471 Effusion, right ankle: Secondary | ICD-10-CM | POA: Diagnosis not present

## 2020-11-11 DIAGNOSIS — M79671 Pain in right foot: Secondary | ICD-10-CM

## 2020-11-11 DIAGNOSIS — M79604 Pain in right leg: Secondary | ICD-10-CM | POA: Diagnosis not present

## 2020-11-11 DIAGNOSIS — M898X6 Other specified disorders of bone, lower leg: Secondary | ICD-10-CM | POA: Diagnosis not present

## 2020-11-11 DIAGNOSIS — M19071 Primary osteoarthritis, right ankle and foot: Secondary | ICD-10-CM | POA: Diagnosis not present

## 2020-11-11 NOTE — Patient Instructions (Addendum)
  The x-rays are reassuring, but with continued swelling in some limitation I would like you to meet with orthopedics to decide on advanced imaging such as CT or MRI versus a trial of physical therapy.  I will place that referral and should hear from them in the next 2 weeks.   If you have lab work done today you will be contacted with your lab results within the next 2 weeks.  If you have not heard from [place] then please contact us. The fastest way to get your results is to register for My Chart.   IF you received an x-ray today, you will receive an invoice from [facility]. Please contact [facility] at [phone] with questions or concerns regarding your invoice.   IF you received labwork today, you will receive an invoice from [facility]. Please contact [facility] at [phone] with questions or concerns regarding your invoice.   Our billing staff will not be able to assist you with questions regarding bills from these companies.  You will be contacted with the lab results as soon as they are available. The fastest way to get your results is to activate your My Chart account. Instructions are located on the last page of this paperwork. If you have not heard from [place] regarding the results in 2 weeks, please contact this office.

## 2020-11-11 NOTE — Progress Notes (Signed)
Subjective:  Patient ID: Timothy Escobar, male    DOB: [birth year]  Age: 80 y.o. MRN: [id number]  CC:  Chief Complaint  Patient presents with  . Follow-up    On Right ankle pain, unspecified chronicity Initial injury November, abrasion followed by swelling.  He locates pain to Achilles area as well as medial midfoot/below ankle     HPI GIL INGWERSEN presents for   Right ankle pain, swelling Discussed on telemedicine visit 3 days ago.  Initial injury around November 15th.abrasion followed by swelling. Abrasion on shin healed.  Medial midfoot/below ankle, differential of sprain, navicular injury, Achilles injury/tendinitis versus retrocalcaneal bursitis.  Here for an office evaluation as well as possible x-rays.  Still sore on inside of ankle and lower tibial area of leg. Swelling on and off since that time.  Able to weight bear,able to walk, but not long walks as in past d/t soreness. Minimal improvement over time.   Tx: none   History Patient Active Problem List   Diagnosis Date Noted  . Bacterial conjunctivitis of both eyes 01/23/2018  . Sinus bradycardia 01/23/2017  . Acute bronchitis 11/21/2016  . Dyslipidemia   . CAD (coronary artery disease)   . Status post coronary artery stent placement   . Bradycardia   . Hypokalemia   . Hyperlipidemia with target LDL less than 70   . STEMI (ST elevation myocardial infarction) ([place]) 08/22/2016  . Acute ST elevation myocardial infarction (STEMI) involving left anterior descending (LAD) coronary artery ([place]) 08/22/2016  . Hip pain 09/28/2012  . Chronic depression 09/28/2012   Past Medical History:  Diagnosis Date  . Blood transfusion without reported diagnosis   . CAD (coronary artery disease)    a. 08/22/2016 Ant STEMI/PCI: LM nl, LAD 30ost, 100p (3.0x15 Synergy DES & 2.75x24 Synergy DES), LCX large/dominant/nl, RCA nondom, nl;  b. 08/2016 Echo: Ef 60-65%, no rwma.  . Cataract    Phreesia 03/29/2020  . Depression   .  Dyslipidemia   . Heart murmur    Phreesia 03/29/2020  . Myocardial infarction ([place])    Phreesia 03/29/2020  . Skin cancer    skin cancer   Past Surgical History:  Procedure Laterality Date  . APPENDECTOMY    . CARDIAC CATHETERIZATION N/A 08/22/2016   Procedure: Left Heart Cath and Coronary Angiography;  Surgeon: Troy Sine, MD;  Location: [facility];  Service: Cardiovascular;  Laterality: N/A;  . CARDIAC CATHETERIZATION N/A 08/22/2016   Procedure: Coronary Stent Intervention;  Surgeon: Troy Sine, MD;  Location: [facility];  Service: Cardiovascular;  Laterality: N/A;  MID LAD  . COLON SURGERY N/A    Phreesia 03/29/2020  . HERNIA REPAIR    . parotid gland cyst removal Right 2012  . vocal fold polyp removed     No Known Allergies Prior to Admission medications   Medication Sig Start Date End Date Taking? Authorizing Provider  aspirin EC 81 MG EC tablet Take 1 tablet (81 mg total) by mouth daily. 08/26/16  Yes Maryellen Pile, MD  atorvastatin (LIPITOR) 80 MG tablet TAKE 1 TABLET BY MOUTH ONCE DAILY AT  6  00  PM 09/20/20  Yes Troy Sine, MD  BRILINTA 60 MG TABS tablet Take 1 tablet by mouth twice daily 10/28/20  Yes Troy Sine, MD  calcium-vitamin D (OSCAL WITH D) 500-200 MG-UNIT per tablet Take 1 tablet by mouth daily. 1,000 of Vit D3   Yes [provider]  cholecalciferol (VITAMIN D)  1000 units tablet Take 1,000 Units by mouth daily.   Yes [provider]  FLUoxetine (PROZAC) 20 MG capsule Take 1 capsule (20 mg total) by mouth daily. 04/01/20  Yes Wendie Agreste, MD  Ginger Root POWD by Does not apply route. Mixes 1/2 teaspoon of powder into hot tea once daily   Yes [provider]  lisinopril (ZESTRIL) 2.5 MG tablet Take 1 tablet by mouth once daily 09/02/20  Yes Troy Sine, MD  Multiple Vitamins-Minerals (MULTIVITAMIN WITH MINERALS) tablet Take 1 tablet by mouth once a week. Centrum Silver   Yes [provider]  nitroGLYCERIN (NITROSTAT) 0.4 MG SL tablet Place 1 tablet (0.4 mg total) under the tongue every 5 (five) minutes x 3 doses as needed for chest pain. 03/03/20  Yes Troy Sine, MD  vitamin C (ASCORBIC ACID) 500 MG tablet Take 500 mg by mouth daily.   Yes [provider]   Social History   Socioeconomic History  . Marital status: Married    Spouse name: Not on file  . Number of children: 2  . Years of education: college  . Highest education level: Not on file  Occupational History  . Occupation: Retired - Water quality scientist  Tobacco Use  . Smoking status: Former Smoker    Quit date: 01/22/1967    Years since quitting: 53.8  . Smokeless tobacco: Never Used  Vaping Use  . Vaping Use: Never used  Substance and Sexual Activity  . Alcohol use: Yes    Alcohol/week: 7.0 standard drinks    Types: 7 Standard drinks or equivalent per week    Comment: wine: 1 glass daily  . Drug use: Not on file  . Sexual activity: Never  Other Topics Concern  . Not on file  Social History Narrative   Lives w/ wife in [place]. 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, plus the church choir.   Exercise walk/gardening daily for 1 hr plus   Social Determinants of Health   Financial Resource Strain: Not on file  Food Insecurity: Not on file  Transportation Needs: Not on file  Physical Activity: Not on file  Stress: Not on file  Social Connections: Not on file  Intimate Partner Violence: Not on file    Review of Systems Per HPI.   Objective:   Vitals:   11/11/20 1513  BP: (!) 144/72  Pulse: 64  Temp: 98 F (36.7 C)  TempSrc: Temporal  SpO2: 98%  Weight: 172 lb (78 kg)  Height: 5\' 8"  (1.727 m)     Physical Exam Constitutional:      General: He is not in acute distress.    Appearance: He is well-developed and well-nourished.  HENT:     Head: Normocephalic and atraumatic.  Cardiovascular:     Rate and Rhythm: Normal rate.   Pulmonary:     Effort: Pulmonary effort is normal.  Musculoskeletal:     Comments: Right knee, no bony tenderness, pain-free range of motion.  Fibular head nontender. Right lower leg, tender to palpation at the distal tibia just proximal to the medial malleolus, slight edema at the lower medial leg, an area of discomfort near distal tibia.  Achilles nontender, calf nontender, negative Thompson's.  Retrocalcaneal bursa nontender.  Slight tenderness of navicula, no other bony tenderness of foot.  Neurovascular intact distally.   Full range of motion of ankle with dorsi and plantar flexion, inversion, eversion with slight discomfort  in medial ankle/lower leg with inversion.  No significant laxity appreciated with anterior drawer, Klieger, talar tilt of ankle  Skin:    Coloration: Skin is pale.  Neurological:     Mental Status: He is alert and oriented to person, place, and time.  Psychiatric:        Mood and Affect: Mood and affect normal.     DG Tibia/Fibula Right  Result Date: 11/11/2020 CLINICAL DATA:  Leg pain EXAM: RIGHT TIBIA AND FIBULA - 2 VIEW COMPARISON:  None. FINDINGS: There is no evidence of fracture or other focal bone lesions. Soft tissues are unremarkable. IMPRESSION: Negative. Electronically Signed   By: Franchot Gallo M.D.   On: 11/11/2020 16:35   DG Ankle Complete Right  Result Date: 11/11/2020 CLINICAL DATA:  Ankle pain EXAM: RIGHT ANKLE - COMPLETE 3+ VIEW COMPARISON:  None. FINDINGS: There is no evidence of fracture, dislocation, or joint effusion. There is no evidence of arthropathy or other focal bone abnormality. Soft tissues are unremarkable. IMPRESSION: Negative. Electronically Signed   By: Franchot Gallo M.D.   On: 11/11/2020 16:34   DG Foot Complete Right  Result Date: 11/11/2020 CLINICAL DATA:  Foot pain. EXAM: RIGHT FOOT COMPLETE - 3+ VIEW COMPARISON:  None. FINDINGS: Negative for fracture.  Degenerative change first MTP.  No erosion. IMPRESSION: Degenerative  change first MTP.  No acute abnormality. Electronically Signed   By: Franchot Gallo M.D.   On: 11/11/2020 16:34    Assessment & Plan:  RASHON PLOURD is a 80 y.o. male . Tibial pain - Plan: DG Ankle Complete Right, DG Foot Complete Right, DG Tibia/Fibula Right, Ambulatory referral to Orthopedic Surgery  Pain and swelling of right ankle - Plan: DG Ankle Complete Right, DG Foot Complete Right, DG Tibia/Fibula Right, Ambulatory referral to Orthopedic Surgery  Right foot pain - Plan: DG Ankle Complete Right, DG Foot Complete Right, DG Tibia/Fibula Right, Ambulatory referral to Orthopedic Surgery  Initial injury mid-November, abrasion, but with persistent swelling, discomfort medial ankle, distal tibia, slight discomfort at navicula.  Posteriorly appears to be okay without sign of Achilles tendinopathy or injury.  X-rays reassuring as above, but has had some limitations in his exercise with these continued symptoms.  Will refer to orthopedics to decide on advanced imaging with CT/MRI versus trial of physical therapy.  RTC precautions if worse prior to that visit.    No orders of the defined types were placed in this encounter.  Patient Instructions    The x-rays are reassuring, but with continued swelling in some limitation I would like you to meet with orthopedics to decide on advanced imaging such as CT or MRI versus a trial of physical therapy.  I will place that referral and should hear from them in the next 2 weeks.   If you have lab work done today you will be contacted with your lab results within the next 2 weeks.  If you have not heard from [place] then please contact us. The fastest way to get your results is to register for My Chart.   IF you received an x-ray today, you will receive an invoice from [facility]. Please contact [facility] at [phone] with questions or concerns regarding your invoice.   IF you received labwork today, you will receive an invoice from  [facility]. Please contact [facility] at [phone] with questions or concerns regarding your invoice.   Our billing staff will not be able to assist you with questions regarding bills from these companies.  You will be contacted with the lab results as soon as they are available. The fastest way to get your results is to activate your My Chart account. Instructions are located on the last page of this paperwork. If you have not heard from [place] regarding the results in 2 weeks, please contact this office.         Signed, Merri Ray, MD [facility]

## 2020-11-16 ENCOUNTER — Ambulatory Visit: Payer: Medicare Other | Admitting: Orthopedic Surgery

## 2020-11-16 DIAGNOSIS — I872 Venous insufficiency (chronic) (peripheral): Secondary | ICD-10-CM | POA: Diagnosis not present

## 2020-11-16 DIAGNOSIS — M76821 Posterior tibial tendinitis, right leg: Secondary | ICD-10-CM | POA: Diagnosis not present

## 2020-11-17 ENCOUNTER — Encounter: Payer: Self-pay | Admitting: Orthopedic Surgery

## 2020-11-17 NOTE — Progress Notes (Signed)
Office Visit Note   Patient: Timothy Escobar           Date of Birth: 08/13/1941           MRN: [id number] Visit Date: 11/16/2020              Requested by: Wendie Agreste, MD [address] [place],  [place] [address] PCP: Wendie Agreste, MD  Chief Complaint  Patient presents with  . Right Ankle - Pain  . Right Foot - Pain      HPI: Patient is a 80 year old gentleman who was seen for initial evaluation for medial right ankle pain that he has had for 3 months he states he is getting better but is still uncomfortable.  Patient states he initially injured the foot when he slipped going up steps in November.  He states he has pain first thing in the morning.  Patient has pain with supination of his foot.  Patient states he is unable to do his mile walks due to pain.  He is on Brilinta.  Assessment & Plan: Visit Diagnoses:  1. Posterior tibial tendinitis, right leg   2. Venous insufficiency (chronic) (peripheral)     Plan: Recommended arch supports stiff soled sneakers and plantar fascial strengthening with toe raises.  Patient has good function with the posterior tibial tendon and strengthening and support should resolve the issue.  Follow-Up Instructions: Return if symptoms worsen or fail to improve.   Ortho Exam  Patient is alert, oriented, no adenopathy, well-dressed, normal affect, normal respiratory effort. Examination patient has swelling and tenderness to palpation along the posterior tibial tendon.  He can do a single limb heel raise without pain the ankle has good range of motion and is stable with anterior drawer he does have venous stasis changes in both legs.  He does have mild pes planus with pronation and valgus of the forefoot.  Imaging: No results found. No images are attached to the encounter.  Labs: Lab Results  Component Value Date   HGBA1C 5.5 08/23/2016     Lab Results  Component Value Date   ALBUMIN 4.3 06/24/2020   ALBUMIN 4.3 09/23/2019    ALBUMIN 5.5 (H) 09/10/2019    Lab Results  Component Value Date   MG 2.0 08/22/2016   Lab Results  Component Value Date   VD25OH 36 10/07/2015   VD25OH 46 09/26/2012    No results found for: PREALBUMIN CBC EXTENDED Latest Ref Rng & Units 06/24/2020 09/10/2019 10/28/2018  WBC 3.4 - 10.8 x10E3/uL 4.2 5.0 4.2  RBC 4.14 - 5.80 x10E6/uL 4.48 4.49 4.36  HGB 13.0 - 17.7 g/dL 14.1 14.5 14.2  HCT 37.5 - 51.0 % 42.4 42.8 41.2  PLT 150 - 450 x10E3/uL 207 192 185  NEUTROABS 1.4 - 7.0 x10E3/uL 2.9 - -  LYMPHSABS 0.7 - 3.1 x10E3/uL 0.7 - -     There is no height or weight on file to calculate BMI.  Orders:  No orders of the defined types were placed in this encounter.  No orders of the defined types were placed in this encounter.    Procedures: No procedures performed  Clinical Data: No additional findings.  ROS:  All other systems negative, except as noted in the HPI. Review of Systems  Objective: Vital Signs: There were no vitals taken for this visit.  Specialty Comments:  No specialty comments available.  PMFS History: Patient Active Problem List   Diagnosis Date Noted  . Bacterial conjunctivitis of both  eyes 01/23/2018  . Sinus bradycardia 01/23/2017  . Acute bronchitis 11/21/2016  . Dyslipidemia   . CAD (coronary artery disease)   . Status post coronary artery stent placement   . Bradycardia   . Hypokalemia   . Hyperlipidemia with target LDL less than 70   . STEMI (ST elevation myocardial infarction) ([place]) 08/22/2016  . Acute ST elevation myocardial infarction (STEMI) involving left anterior descending (LAD) coronary artery ([place]) 08/22/2016  . Hip pain 09/28/2012  . Chronic depression 09/28/2012   Past Medical History:  Diagnosis Date  . Blood transfusion without reported diagnosis   . CAD (coronary artery disease)    a. 08/22/2016 Ant STEMI/PCI: LM nl, LAD 30ost, 100p (3.0x15 Synergy DES & 2.75x24 Synergy DES), LCX large/dominant/nl, RCA nondom, nl;  b.  08/2016 Echo: Ef 60-65%, no rwma.  . Cataract    Phreesia 03/29/2020  . Depression   . Dyslipidemia   . Heart murmur    Phreesia 03/29/2020  . Myocardial infarction ([place])    Phreesia 03/29/2020  . Skin cancer    skin cancer    Family History  Problem Relation Age of Onset  . Dementia Mother   . Cancer Father        skin  . Heart disease Maternal Grandmother   . Stroke Maternal Grandmother   . Cancer Sister        breast (pt did not specific L or R)  . Cancer Brother        bladder and pancreatic  . Heart disease Brother   . Cancer Brother   . Heart disease Sister   . Cancer Sister   . Hypertension Sister   . Cancer Paternal Grandmother     Past Surgical History:  Procedure Laterality Date  . APPENDECTOMY    . CARDIAC CATHETERIZATION N/A 08/22/2016   Procedure: Left Heart Cath and Coronary Angiography;  Surgeon: Troy Sine, MD;  Location: [facility];  Service: Cardiovascular;  Laterality: N/A;  . CARDIAC CATHETERIZATION N/A 08/22/2016   Procedure: Coronary Stent Intervention;  Surgeon: Troy Sine, MD;  Location: [facility];  Service: Cardiovascular;  Laterality: N/A;  MID LAD  . COLON SURGERY N/A    Phreesia 03/29/2020  . HERNIA REPAIR    . parotid gland cyst removal Right 2012  . vocal fold polyp removed     Social History   Occupational History  . Occupation: Retired - Water quality scientist  Tobacco Use  . Smoking status: Former Smoker    Quit date: 01/22/1967    Years since quitting: 53.8  . Smokeless tobacco: Never Used  Vaping Use  . Vaping Use: Never used  Substance and Sexual Activity  . Alcohol use: Yes    Alcohol/week: 7.0 standard drinks    Types: 7 Standard drinks or equivalent per week    Comment: wine: 1 glass daily  . Drug use: Not on file  . Sexual activity: Never

## 2020-11-17 NOTE — Telephone Encounter (Signed)
LVMTCB to let pt know his 1/17 appt will be virtual due to the Weather.

## 2020-12-19 ENCOUNTER — Other Ambulatory Visit: Payer: Self-pay | Admitting: Cardiovascular Disease

## 2020-12-28 DIAGNOSIS — D485 Neoplasm of uncertain behavior of skin: Secondary | ICD-10-CM | POA: Diagnosis not present

## 2020-12-28 DIAGNOSIS — Z85828 Personal history of other malignant neoplasm of skin: Secondary | ICD-10-CM | POA: Diagnosis not present

## 2020-12-28 DIAGNOSIS — D225 Melanocytic nevi of trunk: Secondary | ICD-10-CM | POA: Diagnosis not present

## 2020-12-28 DIAGNOSIS — L821 Other seborrheic keratosis: Secondary | ICD-10-CM | POA: Diagnosis not present

## 2020-12-28 DIAGNOSIS — L989 Disorder of the skin and subcutaneous tissue, unspecified: Secondary | ICD-10-CM | POA: Diagnosis not present

## 2020-12-28 DIAGNOSIS — L905 Scar conditions and fibrosis of skin: Secondary | ICD-10-CM | POA: Diagnosis not present

## 2020-12-28 DIAGNOSIS — L570 Actinic keratosis: Secondary | ICD-10-CM | POA: Diagnosis not present

## 2020-12-28 DIAGNOSIS — D229 Melanocytic nevi, unspecified: Secondary | ICD-10-CM | POA: Diagnosis not present

## 2020-12-28 DIAGNOSIS — L820 Inflamed seborrheic keratosis: Secondary | ICD-10-CM | POA: Diagnosis not present

## 2020-12-28 DIAGNOSIS — L300 Nummular dermatitis: Secondary | ICD-10-CM | POA: Diagnosis not present

## 2021-01-12 DIAGNOSIS — H04123 Dry eye syndrome of bilateral lacrimal glands: Secondary | ICD-10-CM | POA: Diagnosis not present

## 2021-01-12 DIAGNOSIS — H02206 Unspecified lagophthalmos left eye, unspecified eyelid: Secondary | ICD-10-CM | POA: Diagnosis not present

## 2021-01-12 DIAGNOSIS — H0102B Squamous blepharitis left eye, upper and lower eyelids: Secondary | ICD-10-CM | POA: Diagnosis not present

## 2021-01-12 DIAGNOSIS — H0102A Squamous blepharitis right eye, upper and lower eyelids: Secondary | ICD-10-CM | POA: Diagnosis not present

## 2021-01-12 DIAGNOSIS — H25813 Combined forms of age-related cataract, bilateral: Secondary | ICD-10-CM | POA: Diagnosis not present

## 2021-02-28 ENCOUNTER — Other Ambulatory Visit: Payer: Self-pay | Admitting: Cardiovascular Disease

## 2021-03-18 DIAGNOSIS — H25811 Combined forms of age-related cataract, right eye: Secondary | ICD-10-CM | POA: Diagnosis not present

## 2021-04-03 ENCOUNTER — Other Ambulatory Visit: Payer: Self-pay | Admitting: Cardiovascular Disease

## 2021-04-07 ENCOUNTER — Other Ambulatory Visit: Payer: Self-pay | Admitting: Family Medicine

## 2021-04-07 DIAGNOSIS — F32A Depression, unspecified: Secondary | ICD-10-CM

## 2021-05-01 ENCOUNTER — Other Ambulatory Visit: Payer: Self-pay | Admitting: Family Medicine

## 2021-05-01 DIAGNOSIS — F32A Depression, unspecified: Secondary | ICD-10-CM

## 2021-05-02 ENCOUNTER — Other Ambulatory Visit: Payer: Self-pay | Admitting: Family Medicine

## 2021-05-02 ENCOUNTER — Other Ambulatory Visit: Payer: Self-pay

## 2021-05-02 DIAGNOSIS — F32A Depression, unspecified: Secondary | ICD-10-CM

## 2021-05-02 MED ORDER — TICAGRELOR 60 MG PO TABS: 60.0000 mg | ORAL_TABLET | Freq: Two times a day (BID) | ORAL | 1 refills | Status: DC

## 2021-05-03 ENCOUNTER — Telehealth: Payer: Self-pay

## 2021-05-03 ENCOUNTER — Other Ambulatory Visit: Payer: Self-pay

## 2021-05-03 DIAGNOSIS — F32A Depression, unspecified: Secondary | ICD-10-CM

## 2021-05-03 MED ORDER — FLUOXETINE HCL 20 MG PO CAPS: 20.0000 mg | ORAL_CAPSULE | Freq: Every day | ORAL | 0 refills | Status: DC

## 2021-05-03 NOTE — Telephone Encounter (Signed)
Pt needs refill on FLUoxetine (PROZAC) 20 MG capsule [address] [place] [address]   Pt call back [phone]

## 2021-05-03 NOTE — Telephone Encounter (Signed)
Medication sent to patient's pharmacy.

## 2021-05-09 ENCOUNTER — Ambulatory Visit (INDEPENDENT_AMBULATORY_CARE_PROVIDER_SITE_OTHER): Payer: Medicare Other | Admitting: Family Medicine

## 2021-05-09 ENCOUNTER — Encounter: Payer: Self-pay | Admitting: Family Medicine

## 2021-05-09 ENCOUNTER — Other Ambulatory Visit: Payer: Self-pay

## 2021-05-09 DIAGNOSIS — F32A Depression, unspecified: Secondary | ICD-10-CM | POA: Diagnosis not present

## 2021-05-09 MED ORDER — FLUOXETINE HCL 20 MG PO CAPS: 20.0000 mg | ORAL_CAPSULE | Freq: Every day | ORAL | 3 refills | Status: DC

## 2021-05-09 NOTE — Progress Notes (Signed)
Subjective:  Patient ID: Timothy Escobar, male    DOB: Jan 28, 1941  Age: 80 y.o. MRN: [id number]  CC:  Chief Complaint  Patient presents with   Depression    Pt reports doing well, no concerns no break through sxs causing concern at this time. Pt here for continued care and future refills on fluoxetine     HPI Timothy Escobar presents for   Followed by cardiology for other meds needs prozac refilled.   Health maintenance: Shingrix - declines.  Covid vaccine - received initial vaccine and 2 boosters.   Depression: Longstanding use of fluoxetine. Discussed use in past and risks. Mood doing well. No side effects with current dose. Happy. Would like to  stay on 20mg  dose.   Depression screen [facility] 2/9 05/09/2021 11/11/2020 11/08/2020 09/28/2020 04/30/2020  Decreased Interest 0 0 0 0 0  Down, Depressed, Hopeless 0 0 1 0 0  PHQ - 2 Score 0 0 1 0 0  Altered sleeping 0 - - - -  Tired, decreased energy 0 - - - -  Change in appetite 0 - - - -  Feeling bad or failure about yourself  0 - - - -  Trouble concentrating 0 - - - -  Moving slowly or fidgety/restless 0 - - - -  Suicidal thoughts 0 - - - -  PHQ-9 Score 0 - - - -      History Patient Active Problem List   Diagnosis Date Noted   Bacterial conjunctivitis of both eyes 01/23/2018   Sinus bradycardia 01/23/2017   Acute bronchitis 11/21/2016   Dyslipidemia    CAD (coronary artery disease)    Status post coronary artery stent placement    Bradycardia    Hypokalemia    Hyperlipidemia with target LDL less than 70    STEMI (ST elevation myocardial infarction) ([place]) 08/22/2016   Acute ST elevation myocardial infarction (STEMI) involving left anterior descending (LAD) coronary artery ([place]) 08/22/2016   Hip pain 09/28/2012   Chronic depression 09/28/2012   Past Medical History:  Diagnosis Date   Blood transfusion without reported diagnosis    CAD (coronary artery disease)    a. 08/22/2016 Ant STEMI/PCI: LM nl, LAD 30ost, 100p  (3.0x15 Synergy DES & 2.75x24 Synergy DES), LCX large/dominant/nl, RCA nondom, nl;  b. 08/2016 Echo: Ef 60-65%, no rwma.   Cataract    Phreesia 03/29/2020   Depression    Dyslipidemia    Heart murmur    [employer]reesia 03/29/2020   Myocardial infarction [facility])    [employer]reesia 03/29/2020   Skin cancer    skin cancer   Past Surgical History:  Procedure Laterality Date   APPENDECTOMY     CARDIAC CATHETERIZATION N/A 08/22/2016   Procedure: Left Heart Cath and Coronary Angiography;  Surgeon: Troy Sine, MD;  Location: [place] [facility];  Service: Cardiovascular;  Laterality: N/A;   CARDIAC CATHETERIZATION N/A 08/22/2016   Procedure: Coronary Stent Intervention;  Surgeon: Troy Sine, MD;  Location: [place] [facility];  Service: Cardiovascular;  Laterality: N/A;  MID LAD   COLON SURGERY N/A    [employer] 03/29/2020   HERNIA REPAIR     parotid gland cyst removal Right 2012   vocal fold polyp removed     No Known Allergies Prior to Admission medications   Medication Sig Start Date End Date Taking? Authorizing Provider  aspirin EC 81 MG EC tablet Take 1 tablet (81 mg total) by mouth daily. 08/26/16  Yes Maryellen Pile, MD  atorvastatin (LIPITOR) 80 MG tablet TAKE 1 TABLET BY MOUTH ONCE DAILY AT  6  00  PM 12/20/20  Yes Troy Sine, MD  calcium-vitamin D (OSCAL WITH D) 500-200 MG-UNIT per tablet Take 1 tablet by mouth daily. 1,000 of Vit D3   Yes [provider]  cholecalciferol (VITAMIN D) 1000 units tablet Take 1,000 Units by mouth daily.   Yes [provider]  FLUoxetine (PROZAC) 20 MG capsule Take 1 capsule (20 mg total) by mouth daily. 05/03/21  Yes Wendie Agreste, MD  Ginger Root POWD by Does not apply route. Mixes 1/2 teaspoon of powder into hot tea once daily   Yes [provider]  lisinopril (ZESTRIL) 2.5 MG tablet Take 1 tablet by mouth once daily 09/02/20  Yes Troy Sine, MD  Multiple Vitamins-Minerals (MULTIVITAMIN WITH MINERALS) tablet Take 1  tablet by mouth once a week. Centrum Silver   Yes [provider]  nitroGLYCERIN (NITROSTAT) 0.4 MG SL tablet Place 1 tablet (0.4 mg total) under the tongue every 5 (five) minutes x 3 doses as needed for chest pain. 03/03/20  Yes Troy Sine, MD  ticagrelor (BRILINTA) 60 MG TABS tablet Take 1 tablet (60 mg total) by mouth 2 (two) times daily. 05/02/21  Yes Troy Sine, MD  vitamin C (ASCORBIC ACID) 500 MG tablet Take 500 mg by mouth daily.   Yes [provider]   Social History   Socioeconomic History   Marital status: Married    Spouse name: Not on file   Number of children: 2   Years of education: college   Highest education level: Not on file  Occupational History   Occupation: Retired - Water quality scientist  Tobacco Use   Smoking status: Former    Types: Cigarettes    Quit date: 01/22/1967    Years since quitting: 54.3   Smokeless tobacco: Never  Vaping Use   Vaping Use: Never used  Substance and Sexual Activity   Alcohol use: Yes    Alcohol/week: 7.0 standard drinks    Types: 7 Standard drinks or equivalent per week    Comment: wine: 1 glass daily   Drug use: Not on file   Sexual activity: Never  Other Topics Concern   Not on file  Social History Narrative   Lives w/ wife in [place]. 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, plus the church choir.   Exercise walk/gardening daily for 1 hr plus   Social Determinants of Health   Financial Resource Strain: Not on file  Food Insecurity: Not on file  Transportation Needs: Not on file  Physical Activity: Not on file  Stress: Not on file  Social Connections: Not on file  Intimate Partner Violence: Not on file    Review of Systems  Per HPI Objective:   Vitals:   05/09/21 1542  BP: 126/74  Pulse: 61  Resp: 16  Temp: 98.2 F (36.8 C)  TempSrc: Temporal  SpO2: 97%  Weight: 173 lb 3.2 oz (78.6 kg)  Height: 5\' 8"  (1.727 m)     Physical  Exam Constitutional:      General: He is not in acute distress.    Appearance: Normal appearance. He is well-developed.  HENT:     Head: Normocephalic and atraumatic.  Cardiovascular:     Rate and Rhythm: Normal rate.  Pulmonary:     Effort: Pulmonary effort is normal.  Neurological:  Mental Status: He is alert and oriented to person, place, and time.  Psychiatric:        Mood and Affect: Mood normal.        Behavior: Behavior normal.        Thought Content: Thought content normal.        Judgment: Judgment normal.       Assessment & Plan:  Timothy Escobar is a 80 y.o. male . Depression, unspecified depression type - Plan: FLUoxetine (PROZAC) 20 MG capsule  -Stable control on fluoxetine, continue same.  Other health maintenance discussed as above, continue follow-up with cardiology as planned  Meds ordered this encounter  Medications   FLUoxetine (PROZAC) 20 MG capsule    Sig: Take 1 capsule (20 mg total) by mouth daily.    Dispense:  90 capsule    Refill:  3   There are no Patient Instructions on file for this visit.    Signed,   Merri Ray, MD [place] 05/09/21 9:59 PM

## 2021-06-25 ENCOUNTER — Other Ambulatory Visit: Payer: Self-pay | Admitting: Cardiovascular Disease

## 2021-08-07 ENCOUNTER — Other Ambulatory Visit: Payer: Self-pay | Admitting: Cardiovascular Disease

## 2021-08-15 DIAGNOSIS — E785 Hyperlipidemia, unspecified: Secondary | ICD-10-CM

## 2021-08-15 DIAGNOSIS — Z79899 Other long term (current) drug therapy: Secondary | ICD-10-CM

## 2021-08-15 DIAGNOSIS — I10 Essential (primary) hypertension: Secondary | ICD-10-CM

## 2021-08-16 LAB — COMPREHENSIVE METABOLIC PANEL
ALT: 20 IU/L (ref 0–44)
AST: 22 IU/L (ref 0–40)
Albumin/Globulin Ratio: 2.2 (ref 1.2–2.2)
Albumin: 4.2 g/dL (ref 3.7–4.7)
Alkaline Phosphatase: 83 IU/L (ref 44–121)
BUN/Creatinine Ratio: 18 (ref 10–24)
BUN: 18 mg/dL (ref 8–27)
Bilirubin Total: 0.8 mg/dL (ref 0.0–1.2)
CO2: 24 mmol/L (ref 20–29)
Calcium: 9.2 mg/dL (ref 8.6–10.2)
Chloride: 106 mmol/L (ref 96–106)
Creatinine, Ser: 0.98 mg/dL (ref 0.76–1.27)
Globulin, Total: 1.9 g/dL (ref 1.5–4.5)
Glucose: 96 mg/dL (ref 70–99)
Potassium: 5.5 mmol/L — ABNORMAL HIGH (ref 3.5–5.2)
Sodium: 144 mmol/L (ref 134–144)
Total Protein: 6.1 g/dL (ref 6.0–8.5)
eGFR: 79 mL/min/{1.73_m2} (ref >59)

## 2021-08-16 LAB — CBC
Hematocrit: 41.8 % (ref 37.5–51.0)
Hemoglobin: 14.2 g/dL (ref 13.0–17.7)
MCH: 32.4 pg (ref 26.6–33.0)
MCHC: 34 g/dL (ref 31.5–35.7)
MCV: 95 fL (ref 79–97)
Platelets: 189 10*3/uL (ref 150–450)
RBC: 4.38 x10E6/uL (ref 4.14–5.80)
RDW: 12.2 % (ref 11.6–15.4)
WBC: 3.9 10*3/uL (ref 3.4–10.8)

## 2021-08-16 LAB — LIPID PANEL
Chol/HDL Ratio: 2.1 ratio (ref 0.0–5.0)
Cholesterol, Total: 130 mg/dL (ref 100–199)
HDL: 62 mg/dL (ref >39)
LDL Chol Calc (NIH): 56 mg/dL (ref 0–99)
Triglycerides: 56 mg/dL (ref 0–149)
VLDL Cholesterol Cal: 12 mg/dL (ref 5–40)

## 2021-08-16 LAB — TSH: TSH: 1.14 u[IU]/mL (ref 0.450–4.500)

## 2021-08-19 ENCOUNTER — Other Ambulatory Visit: Payer: Self-pay

## 2021-08-19 ENCOUNTER — Encounter: Payer: Self-pay | Admitting: Cardiovascular Disease

## 2021-08-19 ENCOUNTER — Ambulatory Visit: Payer: Medicare Other | Admitting: Cardiovascular Disease

## 2021-08-19 VITALS — BP 122/70 | HR 60 | Ht 68.0 in | Wt 173.2 lb

## 2021-08-19 DIAGNOSIS — I358 Other nonrheumatic aortic valve disorders: Secondary | ICD-10-CM

## 2021-08-19 DIAGNOSIS — I10 Essential (primary) hypertension: Secondary | ICD-10-CM | POA: Diagnosis not present

## 2021-08-19 DIAGNOSIS — I2102 ST elevation (STEMI) myocardial infarction involving left anterior descending coronary artery: Secondary | ICD-10-CM

## 2021-08-19 DIAGNOSIS — E875 Hyperkalemia: Secondary | ICD-10-CM

## 2021-08-19 DIAGNOSIS — I700 Atherosclerosis of aorta: Secondary | ICD-10-CM

## 2021-08-19 NOTE — Progress Notes (Signed)
Cardiology Office Note    Date:  08/21/2021   ID:  Arkin, Imran [birth year], MRN [id number]  PCP:  Wendie Agreste, MD  Cardiologist:  Shelva Majestic, MD    History of Present Illness:  GUNNAR HEREFORD is a 80 y.o. male who sufferred an Anterior STEMI on 08/22/16.  I last saw him in November 2020.  He presents for a 13 month follow-up evaluation.   Mr. Kamali Nephew was without cardiac history and developed acute onset of severe substernal chest pressure while he was moving logs in a wheelbarrow which commenced at approximately 5 PM on 08/22/16. EMS arrived within 15 minutes. His ECG in transient showed 11 mm of ST segment elevation in leads V3 through V6 consistent with an acute anterior injury current. A code STEMI was activated and he was brought emergently directly to the catheterization laboratory where I performed emergent catheterization. Catheterization revealed total occlusion of a large proximal LAD with TIMI 0 flow  and a large dominant normal left circumflex vessel and small nondominant RCA. He underwent successful PCI to the LAD, treated with PTCA/DES stenting at the site of 100% occlusion with initial insertion of a 3.016 mm Synergy DES stent.  Once  TIMI-3 flow was established it was evident that there was segmental disease beyond the stented segment of 70 and 80% and tandem stenting with a 2.7524 mm Synergy DES stent was done with post stent dilatation from 3.23 mm in the proximal portion of the proximal stent to 3.05 mm at the distal portion of the distal stent. LVEDP was 13 mm Hg and the door to balloon time from arrival was 19 minutes. DTB from chest pain onset prior to EMS arrival was < 1 hour.  He ultimately completely resolved his 11 mm ST elevation, ECG normalized, and subsequent echo showed completely normal LV fx with EF 60-65% without any wall motion abnormality! He was discharged 2 days later.  Since his event he has remained asymptomatic.  He completed  participation in cardiac rehabilitation.  He is participating in cardiac rehab.  When I last saw him, he was remaining completely asymptomatic.  Over the past 6 months.  He continues to be asymptomatic.  He is active.  He splits wood.  He continues to sing in the [facility] [employer] choir.  Laboratory on October 03, 2017 showed a total cholesterol 119, HDL 60, LDL 49, triglycerides 51.  Hemoglobin A1c was 5.5.  Creatine 0.88.  TSH 1.05.  Normal LFTs.   I saw him in July 2019 at which time he was continuing to be asymptomatic from a cardiovascular standpoint.  He remains active cutting his grass, cutting firewood, and walking.  He continues to sing in the [employer] choir.  H underwent  an echo Doppler study on Mar 05, 2018.  EF was 60 to 65% and he had normal diastolic parameters.  There was trivial AR, mild MR, trivial TR, and mild PR.    He underwent evaluation by Dr. Cindee Lame for right upper quadrant abdominal stabbing pain.  CT of his abdomen on July 19, 2018 revealed evidence for very mild fatty infiltration of the liver.  His pancreas was unremarkable.  He was found to have a small accessory spleen.  There was mild tension of the right renal collecting system of uncertain significance.  There was no stone or obstructing mass.  Aortic atherosclerosis was present.  He did not have any adenopathy.  He continues to be very active.  He continued to sing for the [facility] choir.  Recent laboratory on October 28, 2018 continue to show excellent lipid studies with total cholesterol 125, HDL 65, LDL 49, and triglycerides 53.  He denied chest pain, PND, orthopnea or  Palpitations.  When I saw him in November 2020 he continued to do exceptionally well.   Unfortunately with the COVID-19 pandemic, he has been unable to perform.  However he is practicing singing daily.  He remained active on his property and denied any exertional shortness of breath or chest pain.   His blood pressure at home typically runs in the 120s over 70s.  He was walking at least 2 miles 4 days/week.  He has not had recent laboratory.    I last saw him on July 01, 2020 and over the past year he had remained completely asymptomatic.  He continued  to be very active working on his property.  This past week he actually took down a small tree.  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  He denies chest pain PND orthopnea, palpitations, or any myalgias.  He just had lab work done several days ago prior to this office visit on June 24, 2020 which is excellent.  Creatinine 1.0.  Glucose 95.  LFTs normal.  Hemoglobin 14.1, Magick 42.4.  TSH normal 1.47.  Lipid studies excellent with total cholesterol 130, HDL 61, LDL 59 on atorvastatin 80 mg.  He continues to be on aspirin and low-dose Brilinta per Pegasys trial data.  He is on low-dose lisinopril 2.5 mg.    Since I last saw him, he was evaluated by his primary physician Dr. Cindee Lame.  He has been on Prozac for depression which has been well controlled and his prescription was renewed.  Presently, with her first remains asymptomatic from a cardiac standpoint.  He continues to seen with the [employer] in [place] and is previously also had some for the [employer] corral plus [employer].  He has learned some additional baritone/base solo contados from [employer] [id number]) and has accomplished other additional difficult pieces.  He remains active working in his yard.  He denies chest pain or palpitations.  He denies any exertional dyspnea.  He continues to be on lisinopril 2.5 mg daily, atorvastatin 80 mg, and DAPT with aspirin and low-dose Brilinta at 60 mg twice daily.  He continues to be on fluoxetine 20 mg daily.  He presents for yearly evaluation.  Past Medical History:  Diagnosis Date   Blood transfusion without reported diagnosis    CAD (coronary artery disease)    a.  08/22/2016 Ant STEMI/PCI: LM nl, LAD 30ost, 100p (3.0x15 Synergy DES & 2.75x24 Synergy DES), LCX large/dominant/nl, RCA nondom, nl;  b. 08/2016 Echo: Ef 60-65%, no rwma.   Cataract    Phreesia 03/29/2020   Depression    Dyslipidemia    Heart murmur    Phreesia 03/29/2020   Myocardial infarction [facility])    Phreesia 03/29/2020   Skin cancer    skin cancer    Past Surgical History:  Procedure Laterality Date   APPENDECTOMY     CARDIAC CATHETERIZATION N/A 08/22/2016   Procedure: Left Heart Cath and Coronary Angiography;  Surgeon: Troy Sine, MD;  Location: [place] CV LAB;  Service: Cardiovascular;  Laterality: N/A;   CARDIAC CATHETERIZATION N/A 08/22/2016   Procedure: Coronary Stent Intervention;  Surgeon: Troy Sine, MD;  Location: [place] [facility] LAB;  Service:  Cardiovascular;  Laterality: N/A;  MID LAD   COLON SURGERY N/A    Phreesia 03/29/2020   HERNIA REPAIR     parotid gland cyst removal Right 2012   vocal fold polyp removed      Current Medications: Outpatient Medications Prior to Visit  Medication Sig Dispense Refill   aspirin EC 81 MG EC tablet Take 1 tablet (81 mg total) by mouth daily. 90 tablet 2   atorvastatin (LIPITOR) 80 MG tablet TAKE 1 TABLET BY MOUTH ONCE DAILY AT  6  00  PM 90 tablet 2   calcium-vitamin D (OSCAL WITH D) 500-200 MG-UNIT per tablet Take 1 tablet by mouth daily. 1,000 of Vit D3     cholecalciferol (VITAMIN D) 1000 units tablet Take 1,000 Units by mouth daily.     FLUoxetine (PROZAC) 20 MG capsule Take 1 capsule (20 mg total) by mouth daily. 90 capsule 3   Ginger Root POWD by Does not apply route. Mixes 1/2 teaspoon of powder into hot tea once daily     lisinopril (ZESTRIL) 2.5 MG tablet Take 1 tablet by mouth once daily 90 tablet 0   Multiple Vitamins-Minerals (MULTIVITAMIN WITH MINERALS) tablet Take 1 tablet by mouth once a week. Centrum Silver     nitroGLYCERIN (NITROSTAT) 0.4 MG SL tablet Place 1 tablet (0.4 mg total) under the tongue  every 5 (five) minutes x 3 doses as needed for chest pain. 25 tablet 3   ticagrelor (BRILINTA) 60 MG TABS tablet Take 1 tablet (60 mg total) by mouth 2 (two) times daily. 180 tablet 1   vitamin C (ASCORBIC ACID) 500 MG tablet Take 500 mg by mouth daily.     No facility-administered medications prior to visit.     Allergies:   Patient has no known allergies.   Social History   Socioeconomic History   Marital status: Married    Spouse name: Not on file   Number of children: 2   Years of education: college   Highest education level: Not on file  Occupational History   Occupation: Retired - Water quality scientist  Tobacco Use   Smoking status: Former    Types: Cigarettes    Quit date: 01/22/1967    Years since quitting: 64.6   Smokeless tobacco: Never  Vaping Use   Vaping Use: Never used  Substance and Sexual Activity   Alcohol use: Yes    Alcohol/week: 7.0 standard drinks    Types: 7 Standard drinks or equivalent per week    Comment: wine: 1 glass daily   Drug use: Not on file   Sexual activity: Never  Other Topics Concern   Not on file  Social History Narrative   Lives w/ wife in [place]. Both are singers, they sing with the [employer] in [place] and with [employer], plus the church choir.   Exercise walk/gardening daily for 1 hr plus   Social Determinants of Health   Financial Resource Strain: Not on file  Food Insecurity: Not on file  Transportation Needs: Not on file  Physical Activity: Not on file  Stress: Not on file  Social Connections: Not on file     Family History:  The patient's family history includes Cancer in his brother, brother, father, paternal grandmother, sister, and sister; Dementia in his mother; Heart disease in his brother, maternal grandmother, and sister; Hypertension in his sister; Stroke in his maternal grandmother.   ROS General: Negative; No fevers, chills, or night sweats;  HEENT: Negative; No changes  in vision or hearing, sinus  congestion, difficulty swallowing Pulmonary: Negative; No cough, wheezing, shortness of breath, hemoptysis Cardiovascular: Negative; No chest pain, presyncope, syncope, palpitations GI: resolved mild right upper quadrant tenderness. GU: Negative; No dysuria, hematuria, or difficulty voiding Musculoskeletal: Negative; no myalgias, joint pain, or weakness Hematologic/Oncology: Negative; no easy bruising, bleeding Endocrine: Negative; no heat/cold intolerance; no diabetes Neuro: Negative; no changes in balance, headaches Skin: Negative; No rashes or skin lesions Psychiatric: Negative; No behavioral problems, depression Sleep: Negative; No snoring, daytime sleepiness, hypersomnolence, bruxism, restless legs, hypnogognic hallucinations, no cataplexy Other comprehensive 14 point system review is negative.   PHYSICAL EXAM:   VS:  BP 122/70   Pulse 60   Ht $R'5\' 8"'WY$  (1.727 m)   Wt 173 lb 3.2 oz (78.6 kg)   SpO2 98%   BMI 26.33 kg/m    Repeat blood pressure by me was 124/70  Wt Readings from Last 3 Encounters:  08/19/21 173 lb 3.2 oz (78.6 kg)  05/09/21 173 lb 3.2 oz (78.6 kg)  11/11/20 172 lb (78 kg)    General: Alert, oriented, no distress.  Skin: normal turgor, no rashes, warm and dry HEENT: Normocephalic, atraumatic. Pupils equal round and reactive to light; sclera anicteric; extraocular muscles intact;  Nose without nasal septal hypertrophy Mouth/Parynx benign; Mallinpatti scale 3 Neck: No JVD, no carotid bruits; normal carotid upstroke Lungs: clear to ausculatation and percussion; no wheezing or rales Chest wall: without tenderness to palpitation Heart: PMI not displaced, RRR, s1 s2 normal, 1/6 systolic murmur, no diastolic murmur, no rubs, gallops, thrills, or heaves Abdomen: soft, nontender; no hepatosplenomehaly, BS+; abdominal aorta nontender and not dilated by palpation. Back: no CVA tenderness Pulses 2+ Musculoskeletal: full range of motion, normal strength, no joint  deformities Extremities: no clubbing cyanosis or edema, Homan's sign negative  Neurologic: grossly nonfocal; Cranial nerves grossly wnl Psychologic: Normal mood and affect    Studies/Labs Reviewed:   August 19, 2021 ECG (independently read by me):  NSR at 60, No ST changes, PR 200 msec, no ectopy  July 01, 2020 ECG (independently read by me):   NSR at 62; No ectopy, normal intervals  November 2020 EKG:  EKG is ordered today. Sinus Bradycardia at 52; Mild early repolarization changes; PR 204 msec  November 04, 2018 ECG (independently read by me): Sinus bradycardia at 57 bpm.  Normal ECG.  No ectopy.  No ST segment changes.  PR interval 206 msec;QTc 412  July 2019 ECG (independently read by me): Sinius bradycardia 59 bpm.  No ST segment changes.  No ECG evidence for prior ACS.  December 2018 ECG (independently read by me): Normal sinus rhythm at 61 bpm.  No ST segment changes.  No ectopy.  Normal intervals.  June 2018 ECG (independently read by me): Sinus bradycardia 51 bpm.  No ST segment changes.  Normal intervals.  February 2018 ECG (independently read by me): Sinus bradycardia 53 bpm.  Normal ECG.  Early repolarization.  No evidence for prior infarction.  Recent Labs: BMP Latest Ref Rng & Units 08/19/2021 08/16/2021 06/24/2020  Glucose 70 - 99 mg/dL 93 96 95  BUN 8 - 27 mg/dL $Remove'19 18 18  'TIRIRIw$ Creatinine 0.76 - 1.27 mg/dL 0.81 0.98 1.00  BUN/Creat Ratio 10 - $Re'24 23 18 18  'bYd$ Sodium 134 - 144 mmol/L 144 144 143  Potassium 3.5 - 5.2 mmol/L 5.6(H) 5.5(H) 5.1  Chloride 96 - 106 mmol/L 105 106 106  CO2 20 - 29 mmol/L $RemoveB'26 24 24  'aDpRsUit$ Calcium 8.6 -  10.2 mg/dL 9.0 9.2 9.3     Hepatic Function Latest Ref Rng & Units 08/16/2021 06/24/2020 09/23/2019  Total Protein 6.0 - 8.5 g/dL 6.1 6.6 6.5  Albumin 3.7 - 4.7 g/dL 4.2 4.3 4.3  AST 0 - 40 IU/L $Remov'22 21 21  'LTpwOK$ ALT 0 - 44 IU/L $Remov'20 21 19  'zxMvGo$ Alk Phosphatase 44 - 121 IU/L 83 83 85  Total Bilirubin 0.0 - 1.2 mg/dL 0.8 0.7 0.7  Bilirubin, Direct <=0.2 mg/dL - - -     CBC Latest Ref Rng & Units 08/16/2021 06/24/2020 09/10/2019  WBC 3.4 - 10.8 x10E3/uL 3.9 4.2 5.0  Hemoglobin 13.0 - 17.7 g/dL 14.2 14.1 14.5  Hematocrit 37.5 - 51.0 % 41.8 42.4 42.8  Platelets 150 - 450 x10E3/uL 189 207 192   Lab Results  Component Value Date   MCV 95 08/16/2021   MCV 95 06/24/2020   MCV 95 09/10/2019   Lab Results  Component Value Date   TSH 1.140 08/16/2021   Lab Results  Component Value Date   HGBA1C 5.5 08/23/2016     BNP    Component Value Date/Time   BNP 164.0 (H) 08/23/2016 0200    ProBNP No results found for: PROBNP   Lipid Panel     Component Value Date/Time   CHOL 130 08/16/2021 0856   TRIG 56 08/16/2021 0856   HDL 62 08/16/2021 0856   CHOLHDL 2.1 08/16/2021 0856   CHOLHDL 2.3 10/10/2016 0951   VLDL 10 10/10/2016 0951   LDLCALC 56 08/16/2021 0856     RADIOLOGY: No results found.   ASSESSMENT:    1. Essential hypertension   2. Acute ST elevation myocardial infarction (STEMI) involving left anterior descending (LAD) coronary artery [facility]): August 22, 2016 with DES stent to LAD   3. Aortic atherosclerosis ([place])   4. Aortic valve sclerosis   5. Hyperkalemia     PLAN:  Mr Gilliam is a very pleasant 60 -year-old Caucasian male who develeoped an ACS/Anterior STEMI with 11 mm ST elevation precordially on Halloween 2017. Fortunately he acted quickly and was prompty brought to [facility] cath lab where I performed emergent cath/PCI .  He had an DTB time of 19 minutes from arrival to the lab and his chest pain duration was less than 60 minutes. Due to disease beyond the initial occlusion he underwent tandem DES stenting with an excellent result.  He has been demonstrated to have complete myocardial salvage and his ECG is normal.  His last echo Doppler study in May 2019 continued to show an EF of 60 to 65% without wall motion abnormalities.  His aortic valve is moderately thickened and moderately calcified but excursion was normal.  He had  trivial AR, mild MR, trivial TR and mild PR.  There was increased thickness of the interatrial septum consistent with lipomatous hypertrophy.  There was mild/moderate LA dilation.  His medical case was interviewed on E[employer]eb[employer] Friendly on his birthday December 09, 2016.  Presently, Mr. Lemire continues to be asymptomatic.  He is active and does a fair amount of yard work without difficulty.  He denies any chest pain, exertional dyspnea, palpitations, presyncope or syncope.  He is continuing to seen and has learned very difficult classical pieces.  His blood pressure today is well controlled on low-dose lisinopril 2.5 mg.  He continues to be on DAPT with aspirin and low-dose Brilinta per Pegasys trial data.  He continues to be on longstanding fluoxetine, prescribed by Dr. Cindee Lame.  He is on atorvastatin  80 mg.  LDL cholesterol on August 16, 2021 was excellent at 56.  Renal function was normal with creatinine 0.98.  Potassium on October 25 was elevated at 5.5.  I will repeat a bmet today for reassessment and we will contact him regarding results.  Clinically he remained stable.  I have suggested that next year he undergo a 4-year follow-up echo Doppler study for reassessment of LV function and valvular architecture with previous documentation of aortic sclerosis.    Medication Adjustments/Labs and Tests Ordered: Current medicines are reviewed at length with the patient today.  Concerns regarding medicines are outlined above.  Medication changes, Labs and Tests ordered today are listed in the Patient Instructions below.  Patient Instructions  Medication Instructions:  Your physician recommends that you continue on your current medications as directed. Please refer to the Current Medication list given to you today.  *If you need a refill on your cardiac medications before your next appointment, please call your pharmacy*   Lab Work: Your physician recommends that you have labs drawn today: BMET  If you have  labs (blood work) drawn today and your tests are completely normal, you will receive your results only by: [place] (if you have MyChart) OR A paper copy in the mail If you have any lab test that is abnormal or we need to change your treatment, we will call you to review the results.   Testing/Procedures: Your physician has requested that you have an echocardiogram. Echocardiography is a painless test that uses sound waves to create images of your heart. It provides your doctor with information about the size and shape of your heart and how well your heart's chambers and valves are working. This procedure takes approximately one hour. There are no restrictions for this procedure. To be done in Oct 2023- prior to visit with Dr. Claiborne Billings. This procedure is done at [address].     Follow-Up: At [facility], you and your health needs are our priority.  As part of our continuing mission to provide you with exceptional heart care, we have created designated Provider Care Teams.  These Care Teams include your primary Cardiologist (physician) and Advanced Practice Providers (APPs -  Physician Assistants and Nurse Practitioners) who all work together to provide you with the care you need, when you need it.  We recommend signing up for the patient portal called "MyChart".  Sign up information is provided on this After Visit Summary.  MyChart is used to connect with patients for Virtual Visits (Telemedicine).  Patients are able to view lab/test results, encounter notes, upcoming appointments, etc.  Non-urgent messages can be sent to your provider as well.   To learn more about what you can do with MyChart, go to [email or web address].    Your next appointment:   12 month(s)  The format for your next appointment:   In Person  Provider:   Shelva Majestic, MD   Signed, Shelva Majestic, MD , [facility] 08/21/2021 9:50 AM    [facility] [address], [place],  [place], [place]  [address] Phone: [phone]

## 2021-08-19 NOTE — Patient Instructions (Signed)
Medication Instructions:  Your physician recommends that you continue on your current medications as directed. Please refer to the Current Medication list given to you today.  *If you need a refill on your cardiac medications before your next appointment, please call your pharmacy*   Lab Work: Your physician recommends that you have labs drawn today: BMET  If you have labs (blood work) drawn today and your tests are completely normal, you will receive your results only by: [place] (if you have MyChart) OR A paper copy in the mail If you have any lab test that is abnormal or we need to change your treatment, we will call you to review the results.   Testing/Procedures: Your physician has requested that you have an echocardiogram. Echocardiography is a painless test that uses sound waves to create images of your heart. It provides your doctor with information about the size and shape of your heart and how well your heart's chambers and valves are working. This procedure takes approximately one hour. There are no restrictions for this procedure. To be done in Oct 2023- prior to visit with Dr. Claiborne Billings. This procedure is done at [address].     Follow-Up: At [facility], you and your health needs are our priority.  As part of our continuing mission to provide you with exceptional heart care, we have created designated Provider Care Teams.  These Care Teams include your primary Cardiologist (physician) and Advanced Practice Providers (APPs -  Physician Assistants and Nurse Practitioners) who all work together to provide you with the care you need, when you need it.  We recommend signing up for the patient portal called "MyChart".  Sign up information is provided on this After Visit Summary.  MyChart is used to connect with patients for Virtual Visits (Telemedicine).  Patients are able to view lab/test results, encounter notes, upcoming appointments, etc.  Non-urgent messages can be  sent to your provider as well.   To learn more about what you can do with MyChart, go to [email or web address].    Your next appointment:   12 month(s)  The format for your next appointment:   In Person  Provider:   Shelva Majestic, MD

## 2021-08-20 LAB — BASIC METABOLIC PANEL
BUN/Creatinine Ratio: 23 (ref 10–24)
BUN: 19 mg/dL (ref 8–27)
CO2: 26 mmol/L (ref 20–29)
Calcium: 9 mg/dL (ref 8.6–10.2)
Chloride: 105 mmol/L (ref 96–106)
Creatinine, Ser: 0.81 mg/dL (ref 0.76–1.27)
Glucose: 93 mg/dL (ref 70–99)
Potassium: 5.6 mmol/L — ABNORMAL HIGH (ref 3.5–5.2)
Sodium: 144 mmol/L (ref 134–144)
eGFR: 90 mL/min/{1.73_m2} (ref >59)

## 2021-08-21 ENCOUNTER — Encounter: Payer: Self-pay | Admitting: Cardiovascular Disease

## 2021-08-23 ENCOUNTER — Other Ambulatory Visit: Payer: Self-pay

## 2021-08-23 DIAGNOSIS — E875 Hyperkalemia: Secondary | ICD-10-CM

## 2021-08-25 ENCOUNTER — Other Ambulatory Visit: Payer: Self-pay

## 2021-08-25 DIAGNOSIS — E875 Hyperkalemia: Secondary | ICD-10-CM

## 2021-08-25 LAB — BASIC METABOLIC PANEL
BUN/Creatinine Ratio: 19 (ref 10–24)
BUN: 17 mg/dL (ref 8–27)
CO2: 27 mmol/L (ref 20–29)
Calcium: 9 mg/dL (ref 8.6–10.2)
Chloride: 104 mmol/L (ref 96–106)
Creatinine, Ser: 0.9 mg/dL (ref 0.76–1.27)
Glucose: 65 mg/dL — ABNORMAL LOW (ref 70–99)
Potassium: 3.8 mmol/L (ref 3.5–5.2)
Sodium: 142 mmol/L (ref 134–144)
eGFR: 87 mL/min/{1.73_m2} (ref >59)

## 2021-08-26 ENCOUNTER — Other Ambulatory Visit: Payer: Self-pay

## 2021-08-26 ENCOUNTER — Ambulatory Visit: Payer: Medicare Other

## 2021-08-26 ENCOUNTER — Ambulatory Visit
Admission: EM | Admit: 2021-08-26 | Discharge: 2021-08-26 | Disposition: A | Discharge status: Home / Self Care | Payer: Medicare Other | Attending: Emergency Medicine | Admitting: Emergency Medicine

## 2021-08-26 DIAGNOSIS — Z8739 Personal history of other diseases of the musculoskeletal system and connective tissue: Secondary | ICD-10-CM

## 2021-08-26 DIAGNOSIS — M722 Plantar fascial fibromatosis: Secondary | ICD-10-CM

## 2021-08-26 DIAGNOSIS — M79671 Pain in right foot: Secondary | ICD-10-CM

## 2021-08-26 MED ORDER — DICLOFENAC SODIUM 1 % EX GEL: 2.0000 g | Freq: Four times a day (QID) | CUTANEOUS | 0 refills | Status: DC

## 2021-08-26 NOTE — ED Triage Notes (Signed)
Pt reports pain in his right heel for abut 4 days. Patient is ambulatory.

## 2021-08-26 NOTE — Telephone Encounter (Signed)
Please advise 

## 2021-08-26 NOTE — Discharge Instructions (Addendum)
Thank you for visiting urgent care today [name].  I commend you on your excellent efforts towards investigating the cause of your right heel pain and performing self care.  Time, I recommend that you have an x-ray performed to rule out bone spur on your right heel, this is the most common cause of a searing, stabbing pain in the heel, even if it only happened once.  I provided you with further information about self-care for plantar fasciitis.  I have also provided you with a prescription for Voltaren gel that you can apply 4 times daily.  I also recommend that you consider freezing a 16 ounce water bottle every night so that you can roll your foot over it first thing in the morning before standing up.  I provided you with information regarding shoe inserts that are very effective as well as a recommendation for an outdoor shoe as you may find very comfortable.  As you are aware, I cannot endorse products per se, I can only share with you things that my family has tried at home and has found very helpful.

## 2021-08-26 NOTE — ED Provider Notes (Signed)
[facility]    CSN: [id number] Arrival date & time: 08/26/21  1217      History   Chief Complaint No chief complaint on file.   HPI Timothy Escobar is a 80 y.o. male.   Patient complains of pain in his right heel for the past 4 days.  Patient is weight bearing and ambulating independently on arrival.  Patient states he has been stretching and rolling his foot over a marble rod with some relief however has begun to realize that he may need evaluation for this issue.   EMR reviewed by me, patient has a history of right posterior tibial tendinitis as well as venous insufficiency on the right, last seen for this by Dr. Sharol Given in orthopedics on November 16, 2020.  At that time patient was advised to place arch supports in stiff soled sneakers and to perform plantar fascial strengthening with toe raises.  Patient was felt to have good function of his posterior tibial tendon and that strengthening support would resolve the issue.  Further imaging was not recommended.  Physical therapy was not recommended.    The history is provided by the patient.   Past Medical History:  Diagnosis Date   Blood transfusion without reported diagnosis    CAD (coronary artery disease)    a. 08/22/2016 Ant STEMI/PCI: LM nl, LAD 30ost, 100p (3.0x15 Synergy DES & 2.75x24 Synergy DES), LCX large/dominant/nl, RCA nondom, nl;  b. 08/2016 Echo: Ef 60-65%, no rwma.   Cataract    Phreesia 03/29/2020   Depression    Dyslipidemia    Heart murmur    Phreesia 03/29/2020   Myocardial infarction [facility])    Phreesia 03/29/2020   Skin cancer    skin cancer    Patient Active Problem List   Diagnosis Date Noted   Bacterial conjunctivitis of both eyes 01/23/2018   Sinus bradycardia 01/23/2017   Acute bronchitis 11/21/2016   Dyslipidemia    CAD (coronary artery disease)    Status post coronary artery stent placement    Bradycardia    Hypokalemia    Hyperlipidemia with target LDL less than 70    STEMI (ST  elevation myocardial infarction) ([place]) 08/22/2016   Acute ST elevation myocardial infarction (STEMI) involving left anterior descending (LAD) coronary artery ([place]) 08/22/2016   Hip pain 09/28/2012   Chronic depression 09/28/2012    Past Surgical History:  Procedure Laterality Date   APPENDECTOMY     CARDIAC CATHETERIZATION N/A 08/22/2016   Procedure: Left Heart Cath and Coronary Angiography;  Surgeon: Troy Sine, MD;  Location: [place] [facility];  Service: Cardiovascular;  Laterality: N/A;   CARDIAC CATHETERIZATION N/A 08/22/2016   Procedure: Coronary Stent Intervention;  Surgeon: Troy Sine, MD;  Location: [place] [facility];  Service: Cardiovascular;  Laterality: N/A;  MID LAD   COLON SURGERY N/A    Phreesia 03/29/2020   HERNIA REPAIR     parotid gland cyst removal Right 2012   vocal fold polyp removed         Home Medications    Prior to Admission medications   Medication Sig Start Date End Date Taking? Authorizing Provider  diclofenac Sodium (VOLTAREN) 1 % GEL Apply 2 g topically 4 (four) times daily. 08/26/21  Yes Lynden Oxford Scales, PA-C  aspirin EC 81 MG EC tablet Take 1 tablet (81 mg total) by mouth daily. 08/26/16   Maryellen Pile, MD  atorvastatin (LIPITOR) 80 MG tablet TAKE 1 TABLET BY MOUTH ONCE DAILY AT  6  00  PM 12/20/20   Troy Sine, MD  calcium-vitamin D (OSCAL WITH D) 500-200 MG-UNIT per tablet Take 1 tablet by mouth daily. 1,000 of Vit D3    [provider]  cholecalciferol (VITAMIN D) 1000 units tablet Take 1,000 Units by mouth daily.    [provider]  FLUoxetine (PROZAC) 20 MG capsule Take 1 capsule (20 mg total) by mouth daily. 05/09/21   Wendie Agreste, MD  Ginger Root POWD by Does not apply route. Mixes 1/2 teaspoon of powder into hot tea once daily    [provider]  lisinopril (ZESTRIL) 2.5 MG tablet Take 1 tablet by mouth once daily 08/08/21   Troy Sine, MD  Multiple Vitamins-Minerals (MULTIVITAMIN  WITH MINERALS) tablet Take 1 tablet by mouth once a week. Centrum Silver    [provider]  nitroGLYCERIN (NITROSTAT) 0.4 MG SL tablet Place 1 tablet (0.4 mg total) under the tongue every 5 (five) minutes x 3 doses as needed for chest pain. 03/03/20   Troy Sine, MD  ticagrelor (BRILINTA) 60 MG TABS tablet Take 1 tablet (60 mg total) by mouth 2 (two) times daily. 05/02/21   Troy Sine, MD  vitamin C (ASCORBIC ACID) 500 MG tablet Take 500 mg by mouth daily.    [provider]    Family History Family History  Problem Relation Age of Onset   Dementia Mother    Cancer Father        skin   Heart disease Maternal Grandmother    Stroke Maternal Grandmother    Cancer Sister        breast (pt did not specific L or R)   Cancer Brother        bladder and pancreatic   Heart disease Brother    Cancer Brother    Heart disease Sister    Cancer Sister    Hypertension Sister    Cancer Paternal Grandmother     Social History Social History   Tobacco Use   Smoking status: Former    Types: Cigarettes    Quit date: 01/22/1967    Years since quitting: 54.6   Smokeless tobacco: Never  Vaping Use   Vaping Use: Never used  Substance Use Topics   Alcohol use: Yes    Alcohol/week: 7.0 standard drinks    Types: 7 Standard drinks or equivalent per week    Comment: wine: 1 glass daily     Allergies   Patient has no known allergies.   Review of Systems Review of Systems Pertinent findings noted in history of present illness.    Physical Exam Triage Vital Signs ED Triage Vitals  Enc Vitals Group     BP 08/19/21 0827 (!) 147/82     Pulse Rate 08/19/21 0827 72     Resp 08/19/21 0827 18     Temp 08/19/21 0827 98.3 F (36.8 C)     Temp Source 08/19/21 0827 Oral     SpO2 08/19/21 0827 98 %     Weight --      Height --      Head Circumference --      Peak Flow --      Pain Score 08/19/21 0826 5     Pain Loc --      Pain Edu? --      Excl. in [place]? --    No  data found.  Updated Vital Signs BP 133/76 (BP Location: Left Arm)  Pulse (!) 58   Temp 98.2 F (36.8 C) (Oral)   SpO2 95%   Visual Acuity Right Eye Distance:   Left Eye Distance:   Bilateral Distance:    Right Eye Near:   Left Eye Near:    Bilateral Near:     Physical Exam Vitals and nursing note reviewed.  Constitutional:      General: He is not in acute distress.    Appearance: Normal appearance. He is not ill-appearing.  HENT:     Head: Normocephalic and atraumatic.  Eyes:     General: Lids are normal.        Right eye: No discharge.        Left eye: No discharge.     Extraocular Movements: Extraocular movements intact.     Conjunctiva/sclera: Conjunctivae normal.     Right eye: Right conjunctiva is not injected.     Left eye: Left conjunctiva is not injected.  Neck:     Trachea: Trachea and phonation normal.  Cardiovascular:     Rate and Rhythm: Normal rate and regular rhythm.     Pulses: Normal pulses.     Heart sounds: Normal heart sounds. No murmur heard.   No friction rub. No gallop.  Pulmonary:     Effort: Pulmonary effort is normal. No accessory muscle usage, prolonged expiration or respiratory distress.     Breath sounds: Normal breath sounds. No stridor, decreased air movement or transmitted upper airway sounds. No decreased breath sounds, wheezing, rhonchi or rales.  Chest:     Chest wall: No tenderness.  Musculoskeletal:        General: Tenderness present. No swelling, deformity or signs of injury. Normal range of motion.     Cervical back: Normal range of motion and neck supple. Normal range of motion.     Right lower leg: No edema.     Left lower leg: No edema.  Lymphadenopathy:     Cervical: No cervical adenopathy.  Skin:    General: Skin is warm and dry.     Findings: No erythema or rash.  Neurological:     General: No focal deficit present.     Mental Status: He is alert and oriented to person, place, and time.  Psychiatric:        Mood  and Affect: Mood normal.        Behavior: Behavior normal.     UC Treatments / Results  Labs (all labs ordered are listed, but only abnormal results are displayed) Labs Reviewed - No data to display  EKG   Radiology No results found.  Procedures Procedures (including critical care time)  Medications Ordered in UC Medications - No data to display  Initial Impression / Assessment and Plan / UC Course  I have reviewed the triage vital signs and the nursing notes.  Pertinent labs & imaging results that were available during my care of the patient were reviewed by me and considered in my medical decision making (see chart for details).     Plantar fasciitis versus calcaneal enthesophyte.  Conservative care recommended with orthotics, ice, topical nonsteroidal anti-inflammatory therapy.  Patient advised he may need to reach out to orthopedics who saw him for the tendinitis.  I have ordered an x-ray of patient's foot to rule out enthesophyte.  Patient verbalized understanding and agreement of plan as discussed.  All questions were addressed during visit.  Please see discharge instructions below for further details of plan.  Final Clinical Impressions(s) / UC  Diagnoses   Final diagnoses:  Inflammatory heel pain, right  Plantar fasciitis of right foot  History of tendinitis     Discharge Instructions      Thank you for visiting urgent care today Mr. Gum.  I commend you on your excellent efforts towards investigating the cause of your right heel pain and performing self care.  Time, I recommend that you have an x-ray performed to rule out bone spur on your right heel, this is the most common cause of a searing, stabbing pain in the heel, even if it only happened once.  I provided you with further information about self-care for plantar fasciitis.  I have also provided you with a prescription for Voltaren gel that you can apply 4 times daily.  I also recommend that you consider  freezing a 16 ounce water bottle every night so that you can roll your foot over it first thing in the morning before standing up.  I provided you with information regarding shoe inserts that are very effective as well as a recommendation for an outdoor shoe as you may find very comfortable.  As you are aware, I cannot endorse products per se, I can only share with you things that my family has tried at home and has found very helpful.       ED Prescriptions     Medication Sig Dispense Auth. Provider   diclofenac Sodium (VOLTAREN) 1 % GEL Apply 2 g topically 4 (four) times daily. 150 g Lynden Oxford Scales, PA-C      PDMP not reviewed this encounter.    Lynden Oxford Scales, PA-C 08/26/21 1738

## 2021-09-19 ENCOUNTER — Other Ambulatory Visit: Payer: Self-pay | Admitting: Cardiovascular Disease

## 2021-09-20 ENCOUNTER — Other Ambulatory Visit: Payer: Self-pay

## 2021-09-20 ENCOUNTER — Ambulatory Visit (INDEPENDENT_AMBULATORY_CARE_PROVIDER_SITE_OTHER): Payer: Medicare Other | Admitting: Registered Nurse

## 2021-09-20 ENCOUNTER — Encounter: Payer: Self-pay | Admitting: Registered Nurse

## 2021-09-20 VITALS — BP 158/75 | HR 61 | Temp 98.3°F | Resp 18 | Ht 68.0 in | Wt 176.0 lb

## 2021-09-20 DIAGNOSIS — R1031 Right lower quadrant pain: Secondary | ICD-10-CM | POA: Diagnosis not present

## 2021-09-20 NOTE — Progress Notes (Signed)
Established Patient Office Visit  Subjective:  Patient ID: Timothy Escobar, male    DOB: Jun 18, 1941  Age: 80 y.o. MRN: [id number]  CC:  Chief Complaint  Patient presents with   Abdominal Pain    Patient states he was having some abdominal pain but it has went away. Patient state he was noticing more gas and discomfort. Patient would like to discuss what this may be.    HPI Timothy Escobar presents for abdominal pain  Ongoing six weeks- bloating and gas like pain, now resolved. Lower right quadrant - just above belt line.  Uncomfortable at night - uncomfortable to move around.  At onset, had excessive flatulence and diarrhea. Has resolved. No blood or mucus in stool Does recall one particular meal that started these symptoms.   Weight has been steady.   Has had intermittent RLQ in past, no significant findings on diagnostics or imaging beyond diverticulosis  Colonoscopy 3 years ago - with [facility]. Noted diverticulosis without itis, 3 polyps. Recall 5 years  Past Medical History:  Diagnosis Date   Blood transfusion without reported diagnosis    CAD (coronary artery disease)    a. 08/22/2016 Ant STEMI/PCI: LM nl, LAD 30ost, 100p (3.0x15 Synergy DES & 2.75x24 Synergy DES), LCX large/dominant/nl, RCA nondom, nl;  b. 08/2016 Echo: Ef 60-65%, no rwma.   Cataract    Phreesia 03/29/2020   Depression    Dyslipidemia    Heart murmur    Phreesia 03/29/2020   Myocardial infarction [facility])    Phreesia 03/29/2020   Skin cancer    skin cancer    Past Surgical History:  Procedure Laterality Date   APPENDECTOMY     CARDIAC CATHETERIZATION N/A 08/22/2016   Procedure: Left Heart Cath and Coronary Angiography;  Surgeon: Troy Sine, MD;  Location: [place] [facility];  Service: Cardiovascular;  Laterality: N/A;   CARDIAC CATHETERIZATION N/A 08/22/2016   Procedure: Coronary Stent Intervention;  Surgeon: Troy Sine, MD;  Location: [place] [facility];  Service: Cardiovascular;   Laterality: N/A;  MID LAD   COLON SURGERY N/A    Phreesia 03/29/2020   HERNIA REPAIR     parotid gland cyst removal Right 2012   vocal fold polyp removed      Family History  Problem Relation Age of Onset   Dementia Mother    Cancer Father        skin   Heart disease Maternal Grandmother    Stroke Maternal Grandmother    Cancer Sister        breast (pt did not specific L or R)   Cancer Brother        bladder and pancreatic   Heart disease Brother    Cancer Brother    Heart disease Sister    Cancer Sister    Hypertension Sister    Cancer Paternal Grandmother     Social History   Socioeconomic History   Marital status: Married    Spouse name: Not on file   Number of children: 2   Years of education: college   Highest education level: Not on file  Occupational History   Occupation: Retired - Water quality scientist  Tobacco Use   Smoking status: Former    Types: Cigarettes    Quit date: 01/22/1967    Years since quitting: 54.6   Smokeless tobacco: Never  Vaping Use   Vaping Use: Never used  Substance and Sexual Activity   Alcohol use: Yes  Alcohol/week: 7.0 standard drinks    Types: 7 Standard drinks or equivalent per week    Comment: wine: 1 glass daily   Drug use: Not on file   Sexual activity: Never  Other Topics Concern   Not on file  Social History Narrative   Lives w/ wife in [place]. Both are singers, they sing with [employer] in [place] and with [employer], plus the church choir.   Exercise walk/gardening daily for 1 hr plus   Social Determinants of Health   Financial Resource Strain: Not on file  Food Insecurity: Not on file  Transportation Needs: Not on file  Physical Activity: Not on file  Stress: Not on file  Social Connections: Not on file  Intimate Partner Violence: Not on file    Outpatient Medications Prior to Visit  Medication Sig Dispense Refill   aspirin EC 81 MG EC tablet Take 1 tablet (81 mg total) by mouth daily. 90  tablet 2   atorvastatin (LIPITOR) 80 MG tablet TAKE 1 TABLET BY MOUTH ONCE DAILY AT  6  00  PM 90 tablet 2   calcium-vitamin D (OSCAL WITH D) 500-200 MG-UNIT per tablet Take 1 tablet by mouth daily. 1,000 of Vit D3     cholecalciferol (VITAMIN D) 1000 units tablet Take 1,000 Units by mouth daily.     FLUoxetine (PROZAC) 20 MG capsule Take 1 capsule (20 mg total) by mouth daily. 90 capsule 3   Ginger Root POWD by Does not apply route. Mixes 1/2 teaspoon of powder into hot tea once daily     Multiple Vitamins-Minerals (MULTIVITAMIN WITH MINERALS) tablet Take 1 tablet by mouth once a week. Centrum Silver     nitroGLYCERIN (NITROSTAT) 0.4 MG SL tablet Place 1 tablet (0.4 mg total) under the tongue every 5 (five) minutes x 3 doses as needed for chest pain. 25 tablet 3   ticagrelor (BRILINTA) 60 MG TABS tablet Take 1 tablet (60 mg total) by mouth 2 (two) times daily. 180 tablet 1   vitamin C (ASCORBIC ACID) 500 MG tablet Take 500 mg by mouth daily.     diclofenac Sodium (VOLTAREN) 1 % GEL Apply 2 g topically 4 (four) times daily. 150 g 0   lisinopril (ZESTRIL) 2.5 MG tablet Take 1 tablet by mouth once daily 90 tablet 0   No facility-administered medications prior to visit.    No Known Allergies  ROS Review of Systems  Constitutional: Negative.   HENT: Negative.    Eyes: Negative.   Respiratory: Negative.    Cardiovascular: Negative.   Gastrointestinal: Negative.   Genitourinary: Negative.   Musculoskeletal: Negative.   Skin: Negative.   Neurological: Negative.   Psychiatric/Behavioral: Negative.       Objective:    Physical Exam Constitutional:      General: He is not in acute distress.    Appearance: Normal appearance. He is normal weight. He is not ill-appearing, toxic-appearing or diaphoretic.  Cardiovascular:     Rate and Rhythm: Normal rate and regular rhythm.     Heart sounds: Normal heart sounds. No murmur heard.   No friction rub. No gallop.  Pulmonary:     Effort:  Pulmonary effort is normal. No respiratory distress.     Breath sounds: Normal breath sounds. No stridor. No wheezing, rhonchi or rales.  Chest:     Chest wall: No tenderness.  Abdominal:     General: Bowel sounds are normal.     Palpations: Abdomen is soft. There  is no shifting dullness, fluid wave, hepatomegaly, splenomegaly, mass or pulsatile mass.     Tenderness: There is no abdominal tenderness. There is no right CVA tenderness, left CVA tenderness, guarding or rebound. Negative signs include Murphy's sign, Rovsing's sign, McBurney's sign and psoas sign.     Hernia: No hernia is present.  Neurological:     General: No focal deficit present.     Mental Status: He is alert and oriented to person, place, and time. Mental status is at baseline.  Psychiatric:        Mood and Affect: Mood normal.        Behavior: Behavior normal.        Thought Content: Thought content normal.        Judgment: Judgment normal.    BP (!) 158/75   Pulse 61   Temp 98.3 F (36.8 C) (Temporal)   Resp 18   Ht $R'5\' 8"'bf$  (1.727 m)   Wt 176 lb (79.8 kg)   SpO2 98%   BMI 26.76 kg/m  Wt Readings from Last 3 Encounters:  09/20/21 176 lb (79.8 kg)  08/19/21 173 lb 3.2 oz (78.6 kg)  05/09/21 173 lb 3.2 oz (78.6 kg)     Health Maintenance Due  Topic Date Due   Zoster Vaccines- Shingrix (1 of 2) Never done   COVID-19 Vaccine (5 - Booster for Pfizer series) 03/30/2021   INFLUENZA VACCINE  05/23/2021    There are no preventive care reminders to display for this patient.  Lab Results  Component Value Date   TSH 1.140 08/16/2021   Lab Results  Component Value Date   WBC 3.9 08/16/2021   HGB 14.2 08/16/2021   HCT 41.8 08/16/2021   MCV 95 08/16/2021   PLT 189 08/16/2021   Lab Results  Component Value Date   NA 142 08/25/2021   K 3.8 08/25/2021   CO2 27 08/25/2021   GLUCOSE 65 (L) 08/25/2021   BUN 17 08/25/2021   CREATININE 0.90 08/25/2021   BILITOT 0.8 08/16/2021   ALKPHOS 83 08/16/2021   AST  22 08/16/2021   ALT 20 08/16/2021   PROT 6.1 08/16/2021   ALBUMIN 4.2 08/16/2021   CALCIUM 9.0 08/25/2021   ANIONGAP 8 02/15/2017   EGFR 87 08/25/2021   Lab Results  Component Value Date   CHOL 130 08/16/2021   Lab Results  Component Value Date   HDL 62 08/16/2021   Lab Results  Component Value Date   LDLCALC 56 08/16/2021   Lab Results  Component Value Date   TRIG 56 08/16/2021   Lab Results  Component Value Date   CHOLHDL 2.1 08/16/2021   Lab Results  Component Value Date   HGBA1C 5.5 08/23/2016      Assessment & Plan:   Problem List Items Addressed This Visit   None Visit Diagnoses     Right lower quadrant abdominal pain    -  Primary       No orders of the defined types were placed in this encounter.   Follow-up: Return if symptoms worsen or fail to improve.   PLAN Exam unremarkable With resolution of symptoms, recommend BRAT diet, low FODMAP diet should they recur. Reviewed red flags and reasons to return. Pt voices understanding. Recommend mild daily stretching in case of MSK etiology. Patient encouraged to call clinic with any questions, comments, or concerns.  Maximiano Coss, NP

## 2021-09-20 NOTE — Patient Instructions (Signed)
Mr. Biermann -   Doristine Devoid to meet you   Glad things have resolved.   In brief, recommend BRAT diet (bananas, rice, apple sauce, toast, bland foods) and low-FODMAP foods should this recur. Can consider imaging, early colonoscopy, lab work, etc. If persistent. Stay well hydrated and stay active. Keep in mind the red flags for returning.  Check out books by Merlinda Frederick for (in my opinion) a balanced look at diet and why we have the relationship we do with food.  Thank you   Rich

## 2021-09-29 ENCOUNTER — Telehealth: Payer: Self-pay | Admitting: Cardiovascular Disease

## 2021-09-29 NOTE — Telephone Encounter (Addendum)
Pt was to discontinue Lisinopril due to elevated potassium levels. Left message for patient to return the call.

## 2021-09-29 NOTE — Telephone Encounter (Signed)
Pt c/o BP issue: STAT if pt c/o blurred vision, one-sided weakness or slurred speech  1. What are your last 5 BP readings? 147/79, 151/78, 145/81, 151/83 and 138/72 140,72  2. Are you having any other symptoms (ex. Dizziness, headache, blurred vision, passed out)?  Having some dizziness off and on   3. What is your BP issue? These readings are high for him-  patient said he  was instructed by Dr Claiborne Billings to stop taking his Lisinopril at the end of October

## 2021-09-29 NOTE — Telephone Encounter (Signed)
Called pt. He states he does not really have dizzy spells, it's more so "slight headiness. It does not interfere with doing anything but I do notice it." Today 151/78, 10-20 mins later: 147/79. Pt is requesting we call [phone], due to his cell phone kicking the calls out. Will get message to Dr. Claiborne Billings and his nurse for review.

## 2021-10-03 ENCOUNTER — Ambulatory Visit: Payer: Medicare Other

## 2021-10-03 ENCOUNTER — Encounter: Payer: Self-pay | Admitting: Registered Nurse

## 2021-10-05 NOTE — Telephone Encounter (Signed)
Patient is now off lisinopril secondary to previous hyperkalemia.  Blood pressure is now elevated.  Recommend starting amlodipine 5 mg pill but for him to initially take one half of a pill for the first week and see how his blood pressure response.  If blood pressure is stable continue 2.5 mg.  Otherwise of systolic blood pressure is 140 or greater increase to 5 mg daily

## 2021-10-06 ENCOUNTER — Ambulatory Visit (INDEPENDENT_AMBULATORY_CARE_PROVIDER_SITE_OTHER): Payer: Medicare Other

## 2021-10-06 ENCOUNTER — Other Ambulatory Visit: Payer: Self-pay

## 2021-10-06 DIAGNOSIS — Z0000 Encounter for general adult medical examination without abnormal findings: Secondary | ICD-10-CM | POA: Diagnosis not present

## 2021-10-06 MED ORDER — AMLODIPINE BESYLATE 5 MG PO TABS: ORAL_TABLET | ORAL | 3 refills | Status: DC

## 2021-10-06 NOTE — Patient Instructions (Signed)
Timothy Escobar , Thank you for taking time to come for your Medicare Wellness Visit. I appreciate your ongoing commitment to your health goals. Please review the following plan we discussed and let me know if I can assist you in the future.   Screening recommendations/referrals: Colonoscopy: 06/27/2019 Recommended yearly ophthalmology/optometry visit for glaucoma screening and checkup Recommended yearly dental visit for hygiene and checkup  Vaccinations: Influenza vaccine: completed  Pneumococcal vaccine: completed  Tdap vaccine: 09/26/2012 Shingles vaccine: declined   Advanced directives: will provide copies   Conditions/risks identified: none   Next appointment: none   Preventive Care 78 Years and Older, Male Preventive care refers to lifestyle choices and visits with your health care provider that can promote health and wellness. What does preventive care include? A yearly physical exam. This is also called an annual well check. Dental exams once or twice a year. Routine eye exams. Ask your health care provider how often you should have your eyes checked. Personal lifestyle choices, including: Daily care of your teeth and gums. Regular physical activity. Eating a healthy diet. Avoiding tobacco and drug use. Limiting alcohol use. Practicing safe sex. Taking low doses of aspirin every day. Taking vitamin and mineral supplements as recommended by your health care provider. What happens during an annual well check? The services and screenings done by your health care provider during your annual well check will depend on your age, overall health, lifestyle risk factors, and family history of disease. Counseling  Your health care provider may ask you questions about your: Alcohol use. Tobacco use. Drug use. Emotional well-being. Home and relationship well-being. Sexual activity. Eating habits. History of falls. Memory and ability to understand (cognition). Work and work  Statistician. Screening  You may have the following tests or measurements: Height, weight, and BMI. Blood pressure. Lipid and cholesterol levels. These may be checked every 5 years, or more frequently if you are over 65 years old. Skin check. Lung cancer screening. You may have this screening every year starting at age 14 if you have a 30-pack-year history of smoking and currently smoke or have quit within the past 15 years. Fecal occult blood test (FOBT) of the stool. You may have this test every year starting at age 37. Flexible sigmoidoscopy or colonoscopy. You may have a sigmoidoscopy every 5 years or a colonoscopy every 10 years starting at age 19. Prostate cancer screening. Recommendations will vary depending on your family history and other risks. Hepatitis C blood test. Hepatitis B blood test. Sexually transmitted disease (STD) testing. Diabetes screening. This is done by checking your blood sugar (glucose) after you have not eaten for a while (fasting). You may have this done every 1-3 years. Abdominal aortic aneurysm (AAA) screening. You may need this if you are a current or former smoker. Osteoporosis. You may be screened starting at age 2 if you are at high risk. Talk with your health care provider about your test results, treatment options, and if necessary, the need for more tests. Vaccines  Your health care provider may recommend certain vaccines, such as: Influenza vaccine. This is recommended every year. Tetanus, diphtheria, and acellular pertussis (Tdap, Td) vaccine. You may need a Td booster every 10 years. Zoster vaccine. You may need this after age 8. Pneumococcal 13-valent conjugate (PCV13) vaccine. One dose is recommended after age 55. Pneumococcal polysaccharide (PPSV23) vaccine. One dose is recommended after age 5. Talk to your health care provider about which screenings and vaccines you need and how often you need  them. This information is not intended to replace  advice given to you by your health care provider. Make sure you discuss any questions you have with your health care provider. Document Released: 11/05/2015 Document Revised: 06/28/2016 Document Reviewed: 08/10/2015 Elsevier Interactive Patient Education  2017 [place] Prevention in the Home Falls can cause injuries. They can happen to people of all ages. There are many things you can do to make your home safe and to help prevent falls. What can I do on the outside of my home? Regularly fix the edges of walkways and driveways and fix any cracks. Remove anything that might make you trip as you walk through a door, such as a raised step or threshold. Trim any bushes or trees on the path to your home. Use bright outdoor lighting. Clear any walking paths of anything that might make someone trip, such as rocks or tools. Regularly check to see if handrails are loose or broken. Make sure that both sides of any steps have handrails. Any raised decks and porches should have guardrails on the edges. Have any leaves, snow, or ice cleared regularly. Use sand or salt on walking paths during winter. Clean up any spills in your garage right away. This includes oil or grease spills. What can I do in the bathroom? Use night lights. Install grab bars by the toilet and in the tub and shower. Do not use towel bars as grab bars. Use non-skid mats or decals in the tub or shower. If you need to sit down in the shower, use a plastic, non-slip stool. Keep the floor dry. Clean up any water that spills on the floor as soon as it happens. Remove soap buildup in the tub or shower regularly. Attach bath mats securely with double-sided non-slip rug tape. Do not have throw rugs and other things on the floor that can make you trip. What can I do in the bedroom? Use night lights. Make sure that you have a light by your bed that is easy to reach. Do not use any sheets or blankets that are too big for your bed.  They should not hang down onto the floor. Have a firm chair that has side arms. You can use this for support while you get dressed. Do not have throw rugs and other things on the floor that can make you trip. What can I do in the kitchen? Clean up any spills right away. Avoid walking on wet floors. Keep items that you use a lot in easy-to-reach places. If you need to reach something above you, use a strong step stool that has a grab bar. Keep electrical cords out of the way. Do not use floor polish or wax that makes floors slippery. If you must use wax, use non-skid floor wax. Do not have throw rugs and other things on the floor that can make you trip. What can I do with my stairs? Do not leave any items on the stairs. Make sure that there are handrails on both sides of the stairs and use them. Fix handrails that are broken or loose. Make sure that handrails are as long as the stairways. Check any carpeting to make sure that it is firmly attached to the stairs. Fix any carpet that is loose or worn. Avoid having throw rugs at the top or bottom of the stairs. If you do have throw rugs, attach them to the floor with carpet tape. Make sure that you have a light switch at  the top of the stairs and the bottom of the stairs. If you do not have them, ask someone to add them for you. What else can I do to help prevent falls? Wear shoes that: Do not have high heels. Have rubber bottoms. Are comfortable and fit you well. Are closed at the toe. Do not wear sandals. If you use a stepladder: Make sure that it is fully opened. Do not climb a closed stepladder. Make sure that both sides of the stepladder are locked into place. Ask someone to hold it for you, if possible. Clearly mark and make sure that you can see: Any grab bars or handrails. First and last steps. Where the edge of each step is. Use tools that help you move around (mobility aids) if they are needed. These  include: Canes. Walkers. Scooters. Crutches. Turn on the lights when you go into a dark area. Replace any light bulbs as soon as they burn out. Set up your furniture so you have a clear path. Avoid moving your furniture around. If any of your floors are uneven, fix them. If there are any pets around you, be aware of where they are. Review your medicines with your doctor. Some medicines can make you feel dizzy. This can increase your chance of falling. Ask your doctor what other things that you can do to help prevent falls. This information is not intended to replace advice given to you by your health care provider. Make sure you discuss any questions you have with your health care provider. Document Released: 08/05/2009 Document Revised: 03/16/2016 Document Reviewed: 11/13/2014 Elsevier Interactive Patient Education  2017 [employer].

## 2021-10-06 NOTE — Progress Notes (Signed)
Subjective:   Timothy Escobar is a 80 y.o. male who presents for an Subsequent Medicare Annual Wellness Visit.   I connected with Timothy Escobar today by telephone and verified that I am speaking with the correct person using two identifiers. Location patient: home Location provider: work Persons participating in the virtual visit: patient, provider.   I discussed the limitations, risks, security and privacy concerns of performing an evaluation and management service by telephone and the availability of in person appointments. I also discussed with the patient that there may be a patient responsible charge related to this service. The patient expressed understanding and verbally consented to this telephonic visit.    Interactive audio and video telecommunications were attempted between this provider and patient, however failed, due to patient having technical difficulties OR patient did not have access to video capability.  We continued and completed visit with audio only.    Review of Systems           Objective:    There were no vitals filed for this visit. There is no height or weight on file to calculate BMI.  Advanced Directives 09/28/2020 08/07/2019 12/18/2017 02/15/2017 01/21/2017 09/30/2016 09/27/2016  Does Patient Have a Medical Advance Directive? Yes Yes Yes Yes Yes Yes Yes  Type of Advance Directive Living will Living will;Healthcare Power of [place] will [place];Living will [place];Living will [place] -  Does patient want to make changes to medical advance directive? No - Patient declined No - Patient declined No - Patient declined - - - -  Copy of [place] in Chart? - No - copy requested - - No - copy requested - -    Current Medications (verified) Outpatient Encounter Medications as of 10/06/2021  Medication Sig   amLODipine (NORVASC) 5 MG tablet Take 0.5 tablet (2.5 mg) for 1  week, if systolic above 633 increase to 1 tablet (5 mg).   aspirin EC 81 MG EC tablet Take 1 tablet (81 mg total) by mouth daily.   atorvastatin (LIPITOR) 80 MG tablet TAKE 1 TABLET BY MOUTH ONCE DAILY AT  6PM   calcium-vitamin D (OSCAL WITH D) 500-200 MG-UNIT per tablet Take 1 tablet by mouth daily. 1,000 of Vit D3   cholecalciferol (VITAMIN D) 1000 units tablet Take 1,000 Units by mouth daily.   diclofenac Sodium (VOLTAREN) 1 % GEL Apply 2 g topically 4 (four) times daily.   FLUoxetine (PROZAC) 20 MG capsule Take 1 capsule (20 mg total) by mouth daily.   Ginger Root POWD by Does not apply route. Mixes 1/2 teaspoon of powder into hot tea once daily   Multiple Vitamins-Minerals (MULTIVITAMIN WITH MINERALS) tablet Take 1 tablet by mouth once a week. Centrum Silver   nitroGLYCERIN (NITROSTAT) 0.4 MG SL tablet Place 1 tablet (0.4 mg total) under the tongue every 5 (five) minutes x 3 doses as needed for chest pain.   ticagrelor (BRILINTA) 60 MG TABS tablet Take 1 tablet (60 mg total) by mouth 2 (two) times daily.   vitamin C (ASCORBIC ACID) 500 MG tablet Take 500 mg by mouth daily.   No facility-administered encounter medications on file as of 10/06/2021.    Allergies (verified) Patient has no known allergies.   History: Past Medical History:  Diagnosis Date   Blood transfusion without reported diagnosis    CAD (coronary artery disease)    a. 08/22/2016 Ant STEMI/PCI: LM nl, LAD 30ost, 100p (3.0x15 Synergy DES &  2.75x24 Synergy DES), LCX large/dominant/nl, RCA nondom, nl;  b. 08/2016 Echo: Ef 60-65%, no rwma.   Cataract    Phreesia 03/29/2020   Depression    Dyslipidemia    Heart murmur    Phreesia 03/29/2020   Myocardial infarction [facility])    Phreesia 03/29/2020   Skin cancer    skin cancer   Past Surgical History:  Procedure Laterality Date   APPENDECTOMY     CARDIAC CATHETERIZATION N/A 08/22/2016   Procedure: Left Heart Cath and Coronary Angiography;  Surgeon: Troy Sine, MD;   Location: [facility];  Service: Cardiovascular;  Laterality: N/A;   CARDIAC CATHETERIZATION N/A 08/22/2016   Procedure: Coronary Stent Intervention;  Surgeon: Troy Sine, MD;  Location: [facility];  Service: Cardiovascular;  Laterality: N/A;  MID LAD   COLON SURGERY N/A    Phreesia 03/29/2020   HERNIA REPAIR     parotid gland cyst removal Right 2012   vocal fold polyp removed     Family History  Problem Relation Age of Onset   Dementia Mother    Cancer Father        skin   Heart disease Maternal Grandmother    Stroke Maternal Grandmother    Cancer Sister        breast (pt did not specific L or R)   Cancer Brother        bladder and pancreatic   Heart disease Brother    Cancer Brother    Heart disease Sister    Cancer Sister    Hypertension Sister    Cancer Paternal Grandmother    Social History   Socioeconomic History   Marital status: Married    Spouse name: Not on file   Number of children: 2   Years of education: college   Highest education level: Not on file  Occupational History   Occupation: Retired - Water quality scientist  Tobacco Use   Smoking status: Former    Types: Cigarettes    Quit date: 01/22/1967    Years since quitting: 54.7   Smokeless tobacco: Never  Vaping Use   Vaping Use: Never used  Substance and Sexual Activity   Alcohol use: Yes    Alcohol/week: 7.0 standard drinks    Types: 7 Standard drinks or equivalent per week    Comment: wine: 1 glass daily   Drug use: Not on file   Sexual activity: Never  Other Topics Concern   Not on file  Social History Narrative   Lives w/ wife in [place]. Both are singers, they sing with the [employer] in [place] and with [employer], plus the church choir.   Exercise walk/gardening daily for 1 hr plus   Social Determinants of Health   Financial Resource Strain: Not on file  Food Insecurity: Not on file  Transportation Needs: Not on file  Physical Activity: Not on file  Stress:  Not on file  Social Connections: Not on file    Tobacco Counseling Counseling given: Not Answered   Clinical Intake:                 Diabetic?no          Activities of Daily Living No flowsheet data found.  Patient Care Team: Wendie Agreste, MD as PCP - General (Family Medicine) Troy Sine, MD as PCP - Cardiology (Cardiology) Leta Baptist, MD as Consulting Physician (Otolaryngology)  Indicate any recent Medical Services you may have received from other  than [facility] providers in the past year (date may be approximate).     Assessment:   This is a routine wellness examination for Braydn.  Hearing/Vision screen No results found.  Dietary issues and exercise activities discussed:     Goals Addressed   None    Depression Screen PHQ 2/9 Scores 09/20/2021 05/09/2021 11/11/2020 11/08/2020 09/28/2020 04/30/2020 04/01/2020  PHQ - 2 Score 0 0 0 1 0 0 0  PHQ- 9 Score 0 0 - - - - 0    Fall Risk Fall Risk  09/20/2021 05/09/2021 11/11/2020 11/08/2020 09/28/2020  Falls in the past year? 0 0 0 0 0  Number falls in past yr: 0 0 - - 0  Injury with Fall? 0 0 - - 0  Risk for fall due to : - No Fall Risks - - -  Follow up Falls evaluation completed Falls evaluation completed Falls evaluation completed - Falls evaluation completed    FALL RISK PREVENTION PERTAINING TO THE HOME:  Any stairs in or around the home? Yes  If so, are there any without handrails? No  Home free of loose throw rugs in walkways, pet beds, electrical cords, etc? Yes  Adequate lighting in your home to reduce risk of falls? Yes   ASSISTIVE DEVICES UTILIZED TO PREVENT FALLS:  Life alert? No  Use of a cane, walker or w/c? No  Grab bars in the bathroom? No  Shower chair or bench in shower? No  Elevated toilet seat or a handicapped toilet? No    Cognitive Function:  Normal cognitive status assessed by direct observation by this Nurse Health Advisor. No abnormalities found.     6CIT Screen  08/07/2019 12/18/2017  What Year? 0 points 0 points  What month? 0 points 0 points  What time? 0 points 0 points  Count back from 20 0 points 0 points  Months in reverse 0 points 0 points  Repeat phrase 2 points 0 points  Total Score 2 0    Immunizations Immunization History  Administered Date(s) Administered   Fluad Quad(high Dose 65+) 07/25/2019, 07/21/2020   Influenza,inj,Quad PF,6+ Mos 07/18/2013, 07/29/2014, 08/30/2015, 07/20/2016, 08/01/2017, 08/21/2018   Influenza-Unspecified 07/23/2012   PFIZER(Purple Top)SARS-COV-2 Vaccination 11/12/2019, 11/30/2019, 07/20/2020, 02/02/2021   Pneumococcal Conjugate-13 12/18/2017   Pneumococcal Polysaccharide-23 05/13/2008   Td 05/13/2008   Tdap 09/26/2012   Zoster, Live 10/23/2009    TDAP status: Up to date  Flu Vaccine status: Up to date  Pneumococcal vaccine status: Up to date  Covid-19 vaccine status: Completed vaccines  Qualifies for Shingles Vaccine? Yes   Zostavax completed No   Shingrix Completed?: No.    Education has been provided regarding the importance of this vaccine. Patient has been advised to call insurance company to determine out of pocket expense if they have not yet received this vaccine. Advised may also receive vaccine at local pharmacy or Health Dept. Verbalized acceptance and understanding.  Screening Tests Health Maintenance  Topic Date Due   Zoster Vaccines- Shingrix (1 of 2) Never done   COVID-19 Vaccine (5 - Booster for Pfizer series) 03/30/2021   INFLUENZA VACCINE  05/23/2021   Hepatitis C Screening  05/09/2022 (Originally 12/09/1958)   TETANUS/TDAP  09/26/2022   COLONOSCOPY (Pts 45-25yrs Insurance coverage will need to be confirmed)  07/06/2024   Pneumonia Vaccine 30+ Years old  Completed   HPV VACCINES  Aged Out    Health Maintenance  Health Maintenance Due  Topic Date Due   Zoster Vaccines- Shingrix (1 of 2)  Never done   COVID-19 Vaccine (5 - Booster for Pfizer series) 03/30/2021   INFLUENZA  VACCINE  05/23/2021    Colorectal cancer screening: Type of screening: Colonoscopy. Completed 06/27/2019. Repeat every 5 years  Lung Cancer Screening: (Low Dose CT Chest recommended if Age 48-80 years, 30 pack-year currently smoking OR have quit w/in 15years.) does not qualify.   Lung Cancer Screening Referral: n/a  Additional Screening:  Hepatitis C Screening: does not qualify;   Vision Screening: Recommended annual ophthalmology exams for early detection of glaucoma and other disorders of the eye. Is the patient up to date with their annual eye exam?  Yes  Who is the provider or what is the name of the office in which the patient attends annual eye exams? Dr.Groat  If pt is not established with a provider, would they like to be referred to a provider to establish care? No .   Dental Screening: Recommended annual dental exams for proper oral hygiene  Community Resource Referral / Chronic Care Management: CRR required this visit?  No   CCM required this visit?  No      Plan:     I have personally reviewed and noted the following in the patients chart:   Medical and social history Use of alcohol, tobacco or illicit drugs  Current medications and supplements including opioid prescriptions. Patient is not currently taking opioid prescriptions. Functional ability and status Nutritional status Physical activity Advanced directives List of other physicians Hospitalizations, surgeries, and ER visits in previous 12 months Vitals Screenings to include cognitive, depression, and falls Referrals and appointments  In addition, I have reviewed and discussed with patient certain preventive protocols, quality metrics, and best practice recommendations. A written personalized care plan for preventive services as well as general preventive health recommendations were provided to patient.     Randel Pigg, LPN   [id number]   Nurse Notes: none

## 2021-10-06 NOTE — Telephone Encounter (Signed)
Called patient, advised of message below.  Patient will start with Amlodipine 2.5 for 1 week, and if systolic still increased will take 5 mg.  Patient verbalized understanding, RX sent to pharmacy.

## 2021-10-13 ENCOUNTER — Encounter: Payer: Self-pay | Admitting: Family Medicine

## 2021-10-13 ENCOUNTER — Ambulatory Visit (INDEPENDENT_AMBULATORY_CARE_PROVIDER_SITE_OTHER): Payer: Medicare Other | Admitting: Family Medicine

## 2021-10-13 ENCOUNTER — Other Ambulatory Visit: Payer: Self-pay

## 2021-10-13 VITALS — BP 132/72 | HR 62 | Temp 98.4°F | Resp 18 | Ht 68.0 in | Wt 173.0 lb

## 2021-10-13 DIAGNOSIS — M25552 Pain in left hip: Secondary | ICD-10-CM

## 2021-10-13 DIAGNOSIS — M7062 Trochanteric bursitis, left hip: Secondary | ICD-10-CM | POA: Diagnosis not present

## 2021-10-13 DIAGNOSIS — M25559 Pain in unspecified hip: Secondary | ICD-10-CM

## 2021-10-13 MED ORDER — PREDNISONE 20 MG PO TABS: 40.0000 mg | ORAL_TABLET | Freq: Every day | ORAL | 0 refills | Status: DC

## 2021-10-13 NOTE — Progress Notes (Signed)
Subjective:  Patient ID: Timothy Escobar, male    DOB: 1941/06/08  Age: 80 y.o. MRN: [id number]  CC:  Chief Complaint  Patient presents with   Hip Pain    Patient states he has been having some left hip pain and tenderness  that is now starting to hurt his left buttock for about one a month . He has tried ice , heat and stretching everyday with no relief. He is very uncomfortable sleeping and sitting in the car     HPI Timothy Escobar presents for   L hip pain: Pain in left buttock/hip. Past month. Sore to site at times. NKI. No change in activity.  Flare of R plantar fasciitis last month. That resolved, but wondering if caused hip issue on left with change in movements. Some pain on outside of hip with area of prior bursitis. Recently more sore.  Pain kept up last night last night.  No bowel or bladder incontinence, no saddle anesthesia, no lower extremity weakness. No radiating pain down leg. No night sweats/weight loss.  No fever, rash, or redness.  No back pain.  Stiff tendons on inside of thigh with stretching.    Tx: ice, heat, stretches daily. No help. Tylenol on occasion. No nsaid as on Brilinta.    History Patient Active Problem List   Diagnosis Date Noted   Bacterial conjunctivitis of both eyes 01/23/2018   Sinus bradycardia 01/23/2017   Acute bronchitis 11/21/2016   Dyslipidemia    CAD (coronary artery disease)    Status post coronary artery stent placement    Bradycardia    Hypokalemia    Hyperlipidemia with target LDL less than 70    STEMI (ST elevation myocardial infarction) ([place]) 08/22/2016   Acute ST elevation myocardial infarction (STEMI) involving left anterior descending (LAD) coronary artery ([place]) 08/22/2016   Hip pain 09/28/2012   Chronic depression 09/28/2012   Past Medical History:  Diagnosis Date   Blood transfusion without reported diagnosis    CAD (coronary artery disease)    a. 08/22/2016 Ant STEMI/PCI: LM nl, LAD 30ost, 100p (3.0x15  Synergy DES & 2.75x24 Synergy DES), LCX large/dominant/nl, RCA nondom, nl;  b. 08/2016 Echo: Ef 60-65%, no rwma.   Cataract    Phreesia 03/29/2020   Depression    Dyslipidemia    Heart murmur    Phreesia 03/29/2020   Myocardial infarction [facility])    Phreesia 03/29/2020   Skin cancer    skin cancer   Past Surgical History:  Procedure Laterality Date   APPENDECTOMY     CARDIAC CATHETERIZATION N/A 08/22/2016   Procedure: Left Heart Cath and Coronary Angiography;  Surgeon: Troy Sine, MD;  Location: [place] [facility];  Service: Cardiovascular;  Laterality: N/A;   CARDIAC CATHETERIZATION N/A 08/22/2016   Procedure: Coronary Stent Intervention;  Surgeon: Troy Sine, MD;  Location: [place] [facility];  Service: Cardiovascular;  Laterality: N/A;  MID LAD   COLON SURGERY N/A    Phreesia 03/29/2020   HERNIA REPAIR     parotid gland cyst removal Right 2012   vocal fold polyp removed     No Known Allergies Prior to Admission medications   Medication Sig Start Date End Date Taking? Authorizing Provider  amLODipine (NORVASC) 5 MG tablet Take 0.5 tablet (2.5 mg) for 1 week, if systolic above 299 increase to 1 tablet (5 mg). 10/06/21  Yes Troy Sine, MD  aspirin EC 81 MG EC tablet Take 1 tablet (81 mg total)  by mouth daily. 08/26/16  Yes Maryellen Pile, MD  atorvastatin (LIPITOR) 80 MG tablet TAKE 1 TABLET BY MOUTH ONCE DAILY AT  6PM 09/21/21  Yes Troy Sine, MD  calcium-vitamin D (OSCAL WITH D) 500-200 MG-UNIT per tablet Take 1 tablet by mouth daily. 1,000 of Vit D3   Yes [provider]  cholecalciferol (VITAMIN D) 1000 units tablet Take 1,000 Units by mouth daily.   Yes [provider]  FLUoxetine (PROZAC) 20 MG capsule Take 1 capsule (20 mg total) by mouth daily. 05/09/21  Yes Wendie Agreste, MD  Ginger Root POWD by Does not apply route. Mixes 1/2 teaspoon of powder into hot tea once daily   Yes [provider]  Multiple Vitamins-Minerals  (MULTIVITAMIN WITH MINERALS) tablet Take 1 tablet by mouth once a week. Centrum Silver   Yes [provider]  nitroGLYCERIN (NITROSTAT) 0.4 MG SL tablet Place 1 tablet (0.4 mg total) under the tongue every 5 (five) minutes x 3 doses as needed for chest pain. 03/03/20  Yes Troy Sine, MD  ticagrelor (BRILINTA) 60 MG TABS tablet Take 1 tablet (60 mg total) by mouth 2 (two) times daily. 05/02/21  Yes Troy Sine, MD  vitamin C (ASCORBIC ACID) 500 MG tablet Take 500 mg by mouth daily.   Yes [provider]  diclofenac Sodium (VOLTAREN) 1 % GEL Apply 2 g topically 4 (four) times daily. 08/26/21   Lynden Oxford Scales, PA-C   Social History   Socioeconomic History   Marital status: Married    Spouse name: Not on file   Number of children: 2   Years of education: college   Highest education level: Not on file  Occupational History   Occupation: Retired - Water quality scientist  Tobacco Use   Smoking status: Former    Types: Cigarettes    Quit date: 01/22/1967    Years since quitting: 54.7   Smokeless tobacco: Never  Vaping Use   Vaping Use: Never used  Substance and Sexual Activity   Alcohol use: Yes    Alcohol/week: 7.0 standard drinks    Types: 7 Standard drinks or equivalent per week    Comment: wine: 1 glass daily   Drug use: Not on file   Sexual activity: Never  Other Topics Concern   Not on file  Social History Narrative   Lives w/ wife in [place]. 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, plus the church choir.   Exercise walk/gardening daily for 1 hr plus   Social Determinants of Health   Financial Resource Strain: Low Risk    Difficulty of Paying Living Expenses: Not hard at all  Food Insecurity: No Food Insecurity   Worried About Charity fundraiser in the Last Year: Never true   Ran Out of Food in the Last Year: Never true  Transportation Needs: No Transportation Needs   Lack of Transportation  (Medical): No   Lack of Transportation (Non-Medical): No  Physical Activity: Sufficiently Active   Days of Exercise per Week: 7 days   Minutes of Exercise per Session: 30 min  Stress: No Stress Concern Present   Feeling of Stress : Not at all  Social Connections: Moderately Integrated   Frequency of Communication with Friends and Family: Twice a week   Frequency of Social Gatherings with Friends and Family: Twice a week   Attends Religious Services: Never   Marine scientist or Organizations: Yes  Attends Archivist Meetings: 1 to 4 times per year   Marital Status: Married  Human resources officer Violence: Not At Risk   Fear of Current or Ex-Partner: No   Emotionally Abused: No   Physically Abused: No   Sexually Abused: No    Review of Systems Per HPI.   Objective:   Vitals:   10/13/21 1441  BP: 132/72  Pulse: 62  Resp: 18  Temp: 98.4 F (36.9 C)  TempSrc: Temporal  SpO2: 99%  Weight: 173 lb (78.5 kg)  Height: 5\' 8"  (1.727 m)     Physical Exam Constitutional:      General: He is not in acute distress.    Appearance: Normal appearance. He is well-developed.  HENT:     Head: Normocephalic and atraumatic.  Cardiovascular:     Rate and Rhythm: Normal rate.  Pulmonary:     Effort: Pulmonary effort is normal.  Musculoskeletal:     Comments: Left hip, slight decreased internal rotation but pain-free internal/external rotation.  Slight tenderness over the trochanteric bursa.  Tender to palpation at the ischium, minimal to SI joint/buttock.  Primarily ischial tenderness.  Slight discomfort with hamstring stretch.  Pain-free back range of motion.  Negative seated straight leg raise.  Ambulating without difficulty  Neurological:     Mental Status: He is alert and oriented to person, place, and time.  Psychiatric:        Mood and Affect: Mood normal.       Assessment & Plan:  Timothy Escobar is a 80 y.o. male . Trochanteric bursitis of left hip - Plan: DG  HIP UNILAT W OR W/O PELVIS 2-3 VIEWS LEFT, CANCELED: DG HIP UNILAT W OR W/O PELVIS 2-3 VIEWS LEFT  Hip pain - Plan: predniSONE (DELTASONE) 20 MG tablet, DG HIP UNILAT W OR W/O PELVIS 2-3 VIEWS LEFT, CANCELED: DG HIP UNILAT W OR W/O PELVIS 2-3 VIEWS LEFT  Left-sided ischial pain - Plan: predniSONE (DELTASONE) 20 MG tablet, DG HIP UNILAT W OR W/O PELVIS 2-3 VIEWS LEFT, CANCELED: DG HIP UNILAT W OR W/O PELVIS 2-3 VIEWS LEFT  Left-sided ischial pain as well as lateral pain consistent with trochanteric bursitis.  Potentially could have been due to activity modification with plantar fasciitis.  Discussed hamstring insertion and diagrams discussed.  Point tender over ischium.  No known injury but will check imaging although unlikely bony concerns, unlikely avulsion, unlikely arthritic hip cause.  - Hamstring stretching discussed.  Continue symptomatic care with avoidance of direct pressure to area.  Trial of prednisone with potential side effects discussed, has tolerated previously.  This should also help the trochanteric bursitis.  Recheck 2 weeks, with RTC/urgent care/ER precautions given.  Meds ordered this encounter  Medications   predniSONE (DELTASONE) 20 MG tablet    Sig: Take 2 tablets (40 mg total) by mouth daily with breakfast.    Dispense:  10 tablet    Refill:  0   Patient Instructions  Xray at [facility] tomorrow. Prednisone for now.- should help both areas. Ok to take tylenol as needed.  Continue stretches and add hamstring stretches.  Recheck in 2 weeks, sooner if worse.     If you have lab work done today you will be contacted with your lab results within the next 2 weeks.  If you have not heard from [place] then please contact us. The fastest way to get your results is to register for My Chart.   IF you received an x-ray today, you will  receive an Pharmacologist from [facility]. Please contact [facility] at [phone] with questions or concerns regarding your invoice.    IF you received labwork today, you will receive an invoice from [facility]. Please contact [facility] at [phone] with questions or concerns regarding your invoice.   Our billing staff will not be able to assist you with questions regarding bills from these companies.  You will be contacted with the lab results as soon as they are available. The fastest way to get your results is to activate your My Chart account. Instructions are located on the last page of this paperwork. If you have not heard from [place] regarding the results in 2 weeks, please contact this office.       Signed,   Merri Ray, MD [place], [facility] 10/13/21 3:54 PM

## 2021-10-13 NOTE — Patient Instructions (Addendum)
Xray at [facility] tomorrow. Prednisone for now.- should help both areas. Ok to take tylenol as needed.  Continue stretches and add hamstring stretches.  Recheck in 2 weeks, sooner if worse.     If you have lab work done today you will be contacted with your lab results within the next 2 weeks.  If you have not heard from [place] then please contact us. The fastest way to get your results is to register for My Chart.   IF you received an x-ray today, you will receive an invoice from [facility]iology. Please contact [facility] Radiology at [phone] with questions or concerns regarding your invoice.   IF you received labwork today, you will receive an invoice from [place] [facility]. Please contact [facility] at [phone] with questions or concerns regarding your invoice.   Our billing staff will not be able to assist you with questions regarding bills from these companies.  You will be contacted with the lab results as soon as they are available. The fastest way to get your results is to activate your My Chart account. Instructions are located on the last page of this paperwork. If you have not heard from [place] regarding the results in 2 weeks, please contact this office.

## 2021-10-14 ENCOUNTER — Ambulatory Visit (INDEPENDENT_AMBULATORY_CARE_PROVIDER_SITE_OTHER)
Admission: RE | Admit: 2021-10-14 | Discharge: 2021-10-14 | Disposition: A | Discharge status: Home / Self Care | Payer: Medicare Other | Source: Ambulatory Visit | Attending: Family Medicine | Admitting: Family Medicine

## 2021-10-14 DIAGNOSIS — M25559 Pain in unspecified hip: Secondary | ICD-10-CM

## 2021-10-14 DIAGNOSIS — M7062 Trochanteric bursitis, left hip: Secondary | ICD-10-CM

## 2021-10-14 DIAGNOSIS — M25552 Pain in left hip: Secondary | ICD-10-CM | POA: Diagnosis not present

## 2021-10-15 ENCOUNTER — Encounter: Payer: Self-pay | Admitting: Family Medicine

## 2021-10-20 ENCOUNTER — Encounter: Payer: Self-pay | Admitting: Cardiovascular Disease

## 2021-10-27 ENCOUNTER — Ambulatory Visit (INDEPENDENT_AMBULATORY_CARE_PROVIDER_SITE_OTHER): Payer: Medicare Other | Admitting: Family Medicine

## 2021-10-27 VITALS — BP 128/78 | HR 57 | Temp 98.1°F | Resp 17 | Ht 68.0 in | Wt 171.8 lb

## 2021-10-27 DIAGNOSIS — M25552 Pain in left hip: Secondary | ICD-10-CM | POA: Diagnosis not present

## 2021-10-27 DIAGNOSIS — M25559 Pain in unspecified hip: Secondary | ICD-10-CM | POA: Diagnosis not present

## 2021-10-27 DIAGNOSIS — M7062 Trochanteric bursitis, left hip: Secondary | ICD-10-CM

## 2021-10-27 DIAGNOSIS — R002 Palpitations: Secondary | ICD-10-CM | POA: Diagnosis not present

## 2021-10-27 DIAGNOSIS — R42 Dizziness and giddiness: Secondary | ICD-10-CM | POA: Diagnosis not present

## 2021-10-27 LAB — CBC
HCT: 43.2 % (ref 39.0–52.0)
Hemoglobin: 14.4 g/dL (ref 13.0–17.0)
MCHC: 33.2 g/dL (ref 30.0–36.0)
MCV: 95.6 fl (ref 78.0–100.0)
Platelets: 158 10*3/uL (ref 150.0–400.0)
RBC: 4.52 Mil/uL (ref 4.22–5.81)
RDW: 13 % (ref 11.5–15.5)
WBC: 4.5 10*3/uL (ref 4.0–10.5)

## 2021-10-27 LAB — BASIC METABOLIC PANEL
BUN: 16 mg/dL (ref 6–23)
CO2: 30 mEq/L (ref 19–32)
Calcium: 8.7 mg/dL (ref 8.4–10.5)
Chloride: 105 mEq/L (ref 96–112)
Creatinine, Ser: 0.86 mg/dL (ref 0.40–1.50)
GFR: 82.72 mL/min (ref >60.00)
Glucose, Bld: 86 mg/dL (ref 70–99)
Potassium: 4 mEq/L (ref 3.5–5.1)
Sodium: 140 mEq/L (ref 135–145)

## 2021-10-27 LAB — TSH: TSH: 0.84 u[IU]/mL (ref 0.35–5.50)

## 2021-10-27 NOTE — Patient Instructions (Addendum)
If hip/leg pain returns let me know and I can refer you to an orthopaedist. Glad to hear that is better.  No med changes for now. I will check labs for lightheadedness, prior heart palpitations. If symptoms persist would recommend follow up with cardiology.   Return to the clinic or go to the nearest emergency room if any of your symptoms worsen or new symptoms occur.   Palpitations Palpitations are feelings that your heartbeat is irregular or is faster than normal. It may feel like your heart is fluttering or skipping a beat. Palpitations may be caused by many things, including smoking, caffeine, alcohol, stress, and certain medicines or drugs. Most causes of palpitations are not serious.  However, some palpitations can be a sign of a serious problem. Further tests and a thorough medical history will be done to find the cause of your palpitations. Your provider may order tests such as an ECG, labs, an echocardiogram, or an ambulatory continuous ECG monitor. Follow these instructions at home: Pay attention to any changes in your symptoms. Let your health care provider know about them. Take these actions to help manage your symptoms: Eating and drinking Follow instructions from your health care provider about eating or drinking restrictions. You may need to avoid foods and drinks that may cause palpitations. These may include: Caffeinated coffee, tea, soft drinks, and energy drinks. Chocolate. Alcohol. Diet pills. Lifestyle   Take steps to reduce your stress and anxiety. Things that can help you relax include: Yoga. Mind-body activities, such as deep breathing, meditation, or using words and images to create positive thoughts (guided imagery). Physical activity, such as swimming, jogging, or walking. Tell your health care provider if your palpitations increase with activity. If you have chest pain or shortness of breath with activity, do not continue the activity until you are seen by your  health care provider. Biofeedback. This is a method that helps you learn to use your mind to control things in your body, such as your heartbeat. Get plenty of rest and sleep. Keep a regular bed time. Do not use drugs, including cocaine or ecstasy. Do not use marijuana. Do not use any products that contain nicotine or tobacco. These products include cigarettes, chewing tobacco, and vaping devices, such as e-cigarettes. If you need help quitting, ask your health care provider. General instructions Take over-the-counter and prescription medicines only as told by your health care provider. Keep all follow-up visits. This is important. These may include visits for further testing if palpitations do not go away or get worse. Contact a health care provider if: You continue to have a fast or irregular heartbeat for a long period of time. You notice that your palpitations occur more often. Get help right away if: You have chest pain or shortness of breath. You have a severe headache. You feel dizzy or you faint. These symptoms may represent a serious problem that is an emergency. Do not wait to see if the symptoms will go away. Get medical help right away. Call your local emergency services (911 in the [place] Do not drive yourself to the hospital. Summary Palpitations are feelings that your heartbeat is irregular or is faster than normal. It may feel like your heart is fluttering or skipping a beat. Palpitations may be caused by many things, including smoking, caffeine, alcohol, stress, certain medicines, and drugs. Further tests and a thorough medical history may be done to find the cause of your palpitations. Get help right away if you faint or have  chest pain, shortness of breath, severe headache, or dizziness. This information is not intended to replace advice given to you by your health care provider. Make sure you discuss any questions you have with your health care provider. Document Revised:  03/02/2021 Document Reviewed: 03/02/2021 Elsevier Patient Education  [place]. Dizziness Dizziness is a common problem. It is a feeling of unsteadiness or light-headedness. You may feel like you are about to faint. Dizziness can lead to injury if you stumble or fall. Anyone can become dizzy, but dizziness is more common in older adults. This condition can be caused by a number of things, including medicines, dehydration, or illness. Follow these instructions at home: Eating and drinking  Drink enough fluid to keep your urine pale yellow. This helps to keep you from becoming dehydrated. Try to drink more clear fluids, such as water. Do not drink alcohol. Limit your caffeine intake if told to do so by your health care provider. Check ingredients and nutrition facts to see if a food or beverage contains caffeine. Limit your salt (sodium) intake if told to do so by your health care provider. Check ingredients and nutrition facts to see if a food or beverage contains sodium. Activity  Avoid making quick movements. Rise slowly from chairs and steady yourself until you feel okay. In the morning, first sit up on the side of the bed. When you feel okay, stand slowly while you hold onto something until you know that your balance is good. If you need to stand in one place for a long time, move your legs often. Tighten and relax the muscles in your legs while you are standing. Do not drive or use machinery if you feel dizzy. Avoid bending down if you feel dizzy. Place items in your home so that they are easy for you to reach without leaning over. Lifestyle Do not use any products that contain nicotine or tobacco. These products include cigarettes, chewing tobacco, and vaping devices, such as e-cigarettes. If you need help quitting, ask your health care provider. Try to reduce your stress level by using methods such as yoga or meditation. Talk with your health care provider if you need help to  manage your stress. General instructions Watch your dizziness for any changes. Take over-the-counter and prescription medicines only as told by your health care provider. Talk with your health care provider if you think that your dizziness is caused by a medicine that you are taking. Tell a friend or a family member that you are feeling dizzy. If he or she notices any changes in your behavior, have this person call your health care provider. Keep all follow-up visits. This is important. Contact a health care provider if: Your dizziness does not go away or you have new symptoms. Your dizziness or light-headedness gets worse. You feel nauseous. You have reduced hearing. You have a fever. You have neck pain or a stiff neck. Your dizziness leads to an injury or a fall. Get help right away if: You vomit or have diarrhea and are unable to eat or drink anything. You have problems talking, walking, swallowing, or using your arms, hands, or legs. You feel generally weak. You have any bleeding. You are not thinking clearly or you have trouble forming sentences. It may take a friend or family member to notice this. You have chest pain, abdominal pain, shortness of breath, or sweating. Your vision changes or you develop a severe headache. These symptoms may represent a serious problem that is  an emergency. Do not wait to see if the symptoms will go away. Get medical help right away. Call your local emergency services (911 in the [place] Do not drive yourself to the hospital. Summary Dizziness is a feeling of unsteadiness or light-headedness. This condition can be caused by a number of things, including medicines, dehydration, or illness. Anyone can become dizzy, but dizziness is more common in older adults. Drink enough fluid to keep your urine pale yellow. Do not drink alcohol. Avoid making quick movements if you feel dizzy. Monitor your dizziness for any changes. This information is not intended to  replace advice given to you by your health care provider. Make sure you discuss any questions you have with your health care provider. Document Revised: 09/13/2020 Document Reviewed: 09/13/2020 Elsevier Patient Education  2022 [employer].

## 2021-10-27 NOTE — Progress Notes (Signed)
Subjective:  Patient ID: Timothy Escobar, male    DOB: 1941/06/05  Age: 81 y.o. MRN: [id number]  CC:  Chief Complaint  Patient presents with   Hip Pain    Pt reports hip pain is mostly resolved, notes some pain in center of joint but otherwise has improved    Hypertension    Pt would like to discuss his bp due to change in med from cardiology     HPI REILLY BLADES presents for   Left hip pain Discussed in December.  Possible flare of arthritis, trochanteric bursitis with change in gait from previous right-sided plantar fasciitis.  Hamstring stretches and other hip stretches discussed, prednisone 40 mg daily x5 days.  Imaging reassuring without acute osseous or joint abnormality on 10/14/2021.  Very mild inferomedial joint space narrowing bilaterally in the hips. Pain has improved.  Only slight pain in the center of the joint but otherwise better. Hardly anything today - feels resolved, no meds needed at this time. No limitations.   Hypertension: Note reviewed from 10/20/2021 to cardiology.  Dizziness, with strong pulse beat or palpitation noted at night, heart rate 60-65.  Amlodipine 5 mg daily since 10/15/2021 (amlodipine 2.5 mg daily for 1 week in mid December, and then increased dose for elevated systolic in 270'B, now at 5mg  since 12/24)  Previously discontinued lisinopril secondary to hyperkalemia). On 5mg  dose - slight dizziness for few days last week, slight sensation yesterday. Lightheaded.  134/77. 135/73, 138/79, 153/79, 867, 544, 920 systolics.  No HA or focal weakness, or slurred speech.  No CP/dyspnea. Past palpitations 12/28 - pounding feeling with pulse - few minutes. No associated sx's. Rate ok, just harder beat.  Improved diet past few months, no missed meals, drinking fluids.   Home readings: BP Readings from Last 3 Encounters:  10/27/21 128/78  10/13/21 132/72  09/20/21 (!) 158/75   Lab Results  Component Value Date   CREATININE 0.86 10/27/2021       History Patient Active Problem List   Diagnosis Date Noted   Bacterial conjunctivitis of both eyes 01/23/2018   Sinus bradycardia 01/23/2017   Acute bronchitis 11/21/2016   Dyslipidemia    CAD (coronary artery disease)    Status post coronary artery stent placement    Bradycardia    Hypokalemia    Hyperlipidemia with target LDL less than 70    STEMI (ST elevation myocardial infarction) ([place]) 08/22/2016   Acute ST elevation myocardial infarction (STEMI) involving left anterior descending (LAD) coronary artery ([place]) 08/22/2016   Hip pain 09/28/2012   Chronic depression 09/28/2012   Past Medical History:  Diagnosis Date   Blood transfusion without reported diagnosis    CAD (coronary artery disease)    a. 08/22/2016 Ant STEMI/PCI: LM nl, LAD 30ost, 100p (3.0x15 Synergy DES & 2.75x24 Synergy DES), LCX large/dominant/nl, RCA nondom, nl;  b. 08/2016 Echo: Ef 60-65%, no rwma.   Cataract    Phreesia 03/29/2020   Depression    Dyslipidemia    Heart murmur    Phreesia 03/29/2020   Myocardial infarction [facility])    Phreesia 03/29/2020   Skin cancer    skin cancer   Past Surgical History:  Procedure Laterality Date   APPENDECTOMY     CARDIAC CATHETERIZATION N/A 08/22/2016   Procedure: Left Heart Cath and Coronary Angiography;  Surgeon: Troy Sine, MD;  Location: [place] [facility];  Service: Cardiovascular;  Laterality: N/A;   CARDIAC CATHETERIZATION N/A 08/22/2016   Procedure: Coronary Stent Intervention;  Surgeon: Troy Sine, MD;  Location: [place] [facility];  Service: Cardiovascular;  Laterality: N/A;  MID LAD   COLON SURGERY N/A    Phreesia 03/29/2020   HERNIA REPAIR     parotid gland cyst removal Right 2012   vocal fold polyp removed     No Known Allergies Prior to Admission medications   Medication Sig Start Date End Date Taking? Authorizing Provider  amLODipine (NORVASC) 5 MG tablet Take 0.5 tablet (2.5 mg) for 1 week, if systolic above 811 increase to 1  tablet (5 mg). 10/06/21  Yes Troy Sine, MD  aspirin EC 81 MG EC tablet Take 1 tablet (81 mg total) by mouth daily. 08/26/16  Yes Maryellen Pile, MD  atorvastatin (LIPITOR) 80 MG tablet TAKE 1 TABLET BY MOUTH ONCE DAILY AT  6PM 09/21/21  Yes Troy Sine, MD  calcium-vitamin D (OSCAL WITH D) 500-200 MG-UNIT per tablet Take 1 tablet by mouth daily. 1,000 of Vit D3   Yes [provider]  cholecalciferol (VITAMIN D) 1000 units tablet Take 1,000 Units by mouth daily.   Yes [provider]  FLUoxetine (PROZAC) 20 MG capsule Take 1 capsule (20 mg total) by mouth daily. 05/09/21  Yes Wendie Agreste, MD  Ginger Root POWD by Does not apply route. Mixes 1/2 teaspoon of powder into hot tea once daily   Yes [provider]  Multiple Vitamins-Minerals (MULTIVITAMIN WITH MINERALS) tablet Take 1 tablet by mouth once a week. Centrum Silver   Yes [provider]  nitroGLYCERIN (NITROSTAT) 0.4 MG SL tablet Place 1 tablet (0.4 mg total) under the tongue every 5 (five) minutes x 3 doses as needed for chest pain. 03/03/20  Yes Troy Sine, MD  ticagrelor (BRILINTA) 60 MG TABS tablet Take 1 tablet (60 mg total) by mouth 2 (two) times daily. 05/02/21  Yes Troy Sine, MD  vitamin C (ASCORBIC ACID) 500 MG tablet Take 500 mg by mouth daily.   Yes [provider]  diclofenac Sodium (VOLTAREN) 1 % GEL Apply 2 g topically 4 (four) times daily. 08/26/21   Lynden Oxford Scales, PA-C  predniSONE (DELTASONE) 20 MG tablet Take 2 tablets (40 mg total) by mouth daily with breakfast. 10/13/21   Wendie Agreste, MD   Social History   Socioeconomic History   Marital status: Married    Spouse name: Not on file   Number of children: 2   Years of education: college   Highest education level: Not on file  Occupational History   Occupation: Retired - Water quality scientist  Tobacco Use   Smoking status: Former    Types: Cigarettes    Quit date: 01/22/1967    Years since  quitting: 54.8   Smokeless tobacco: Never  Vaping Use   Vaping Use: Never used  Substance and Sexual Activity   Alcohol use: Yes    Alcohol/week: 7.0 standard drinks    Types: 7 Standard drinks or equivalent per week    Comment: wine: 1 glass daily   Drug use: Not on file   Sexual activity: Never  Other Topics Concern   Not on file  Social History Narrative   Lives w/ wife in [place]. Both are singers, they sing with the [employer] in [place] and with [employer], plus the church choir.   Exercise walk/gardening daily for 1 hr plus   Social Determinants of Health   Financial Resource Strain: Low Risk    Difficulty of Paying Living  Expenses: Not hard at all  Food Insecurity: No Food Insecurity   Worried About Charity fundraiser in the Last Year: Never true   Ran Out of Food in the Last Year: Never true  Transportation Needs: No Transportation Needs   Lack of Transportation (Medical): No   Lack of Transportation (Non-Medical): No  Physical Activity: Sufficiently Active   Days of Exercise per Week: 7 days   Minutes of Exercise per Session: 30 min  Stress: No Stress Concern Present   Feeling of Stress : Not at all  Social Connections: Moderately Integrated   Frequency of Communication with Friends and Family: Twice a week   Frequency of Social Gatherings with Friends and Family: Twice a week   Attends Religious Services: Never   Marine scientist or Organizations: Yes   Attends Music therapist: 1 to 4 times per year   Marital Status: Married  Human resources officer Violence: Not At Risk   Fear of Current or Ex-Partner: No   Emotionally Abused: No   Physically Abused: No   Sexually Abused: No    Review of Systems Per HPI.   Objective:   Vitals:   10/27/21 0956  BP: 128/78  Pulse: (!) 57  Resp: 17  Temp: 98.1 F (36.7 C)  TempSrc: Temporal  SpO2: 97%  Weight: 171 lb 12.8 oz (77.9 kg)  Height: 5\' 8"  (1.727 m)    Physical Exam Vitals  reviewed.  Constitutional:      Appearance: He is well-developed.  HENT:     Head: Normocephalic and atraumatic.  Neck:     Vascular: No carotid bruit or JVD.  Cardiovascular:     Rate and Rhythm: Normal rate and regular rhythm.     Heart sounds: Normal heart sounds. No murmur heard.    Comments: No ectopy.  Pulmonary:     Effort: Pulmonary effort is normal.     Breath sounds: Normal breath sounds. No rales.  Musculoskeletal:     Right lower leg: No edema.     Left lower leg: No edema.     Comments: L hip pain free ROM, troch bursa nontender.   Skin:    General: Skin is warm and dry.  Neurological:     Mental Status: He is alert and oriented to person, place, and time.  Psychiatric:        Mood and Affect: Mood normal.    EKG: Sinus bradycardia, rate 52.  No ectopy.  No acute ST/T wave changes seen.  No apparent significant changes compared to 08/19/2021 EKG.   Assessment & Plan:  KYMARI NUON is a 81 y.o. male . Hip pain Left-sided ischial pain Trochanteric bursitis of left hip  -Improved, continue range of motion, stretches, option of Ortho if symptoms return/worsen.  Lightheadedness - Plan: Basic metabolic panel, CBC, TSH, EKG 12-Lead Palpitations - Plan: Basic metabolic panel, CBC, TSH, EKG 12-Lead  -Check CBC, TSH, labs above.  Based on current blood pressure would not recommend med changes.  Option to follow-up with cardiology if symptoms return.  ER precautions/RTC precautions given.  No orders of the defined types were placed in this encounter.  Patient Instructions  If hip/leg pain returns let me know and I can refer you to an orthopaedist. Glad to hear that is better.  No med changes for now. I will check labs for lightheadedness, prior heart palpitations. If symptoms persist would recommend follow up with cardiology.   Return to the clinic or go  to the nearest emergency room if any of your symptoms worsen or new symptoms  occur.   Palpitations Palpitations are feelings that your heartbeat is irregular or is faster than normal. It may feel like your heart is fluttering or skipping a beat. Palpitations may be caused by many things, including smoking, caffeine, alcohol, stress, and certain medicines or drugs. Most causes of palpitations are not serious.  However, some palpitations can be a sign of a serious problem. Further tests and a thorough medical history will be done to find the cause of your palpitations. Your provider may order tests such as an ECG, labs, an echocardiogram, or an ambulatory continuous ECG monitor. Follow these instructions at home: Pay attention to any changes in your symptoms. Let your health care provider know about them. Take these actions to help manage your symptoms: Eating and drinking Follow instructions from your health care provider about eating or drinking restrictions. You may need to avoid foods and drinks that may cause palpitations. These may include: Caffeinated coffee, tea, soft drinks, and energy drinks. Chocolate. Alcohol. Diet pills. Lifestyle   Take steps to reduce your stress and anxiety. Things that can help you relax include: Yoga. Mind-body activities, such as deep breathing, meditation, or using words and images to create positive thoughts (guided imagery). Physical activity, such as swimming, jogging, or walking. Tell your health care provider if your palpitations increase with activity. If you have chest pain or shortness of breath with activity, do not continue the activity until you are seen by your health care provider. Biofeedback. This is a method that helps you learn to use your mind to control things in your body, such as your heartbeat. Get plenty of rest and sleep. Keep a regular bed time. Do not use drugs, including cocaine or ecstasy. Do not use marijuana. Do not use any products that contain nicotine or tobacco. These products include cigarettes,  chewing tobacco, and vaping devices, such as e-cigarettes. If you need help quitting, ask your health care provider. General instructions Take over-the-counter and prescription medicines only as told by your health care provider. Keep all follow-up visits. This is important. These may include visits for further testing if palpitations do not go away or get worse. Contact a health care provider if: You continue to have a fast or irregular heartbeat for a long period of time. You notice that your palpitations occur more often. Get help right away if: You have chest pain or shortness of breath. You have a severe headache. You feel dizzy or you faint. These symptoms may represent a serious problem that is an emergency. Do not wait to see if the symptoms will go away. Get medical help right away. Call your local emergency services (911 in the [place] Do not drive yourself to the hospital. Summary Palpitations are feelings that your heartbeat is irregular or is faster than normal. It may feel like your heart is fluttering or skipping a beat. Palpitations may be caused by many things, including smoking, caffeine, alcohol, stress, certain medicines, and drugs. Further tests and a thorough medical history may be done to find the cause of your palpitations. Get help right away if you faint or have chest pain, shortness of breath, severe headache, or dizziness. This information is not intended to replace advice given to you by your health care provider. Make sure you discuss any questions you have with your health care provider. Document Revised: 03/02/2021 Document Reviewed: 03/02/2021 Elsevier Patient Education  [place]. Dizziness  Dizziness is a common problem. It is a feeling of unsteadiness or light-headedness. You may feel like you are about to faint. Dizziness can lead to injury if you stumble or fall. Anyone can become dizzy, but dizziness is more common in older adults. This condition can  be caused by a number of things, including medicines, dehydration, or illness. Follow these instructions at home: Eating and drinking  Drink enough fluid to keep your urine pale yellow. This helps to keep you from becoming dehydrated. Try to drink more clear fluids, such as water. Do not drink alcohol. Limit your caffeine intake if told to do so by your health care provider. Check ingredients and nutrition facts to see if a food or beverage contains caffeine. Limit your salt (sodium) intake if told to do so by your health care provider. Check ingredients and nutrition facts to see if a food or beverage contains sodium. Activity  Avoid making quick movements. Rise slowly from chairs and steady yourself until you feel okay. In the morning, first sit up on the side of the bed. When you feel okay, stand slowly while you hold onto something until you know that your balance is good. If you need to stand in one place for a long time, move your legs often. Tighten and relax the muscles in your legs while you are standing. Do not drive or use machinery if you feel dizzy. Avoid bending down if you feel dizzy. Place items in your home so that they are easy for you to reach without leaning over. Lifestyle Do not use any products that contain nicotine or tobacco. These products include cigarettes, chewing tobacco, and vaping devices, such as e-cigarettes. If you need help quitting, ask your health care provider. Try to reduce your stress level by using methods such as yoga or meditation. Talk with your health care provider if you need help to manage your stress. General instructions Watch your dizziness for any changes. Take over-the-counter and prescription medicines only as told by your health care provider. Talk with your health care provider if you think that your dizziness is caused by a medicine that you are taking. Tell a friend or a family member that you are feeling dizzy. If he or she notices any  changes in your behavior, have this person call your health care provider. Keep all follow-up visits. This is important. Contact a health care provider if: Your dizziness does not go away or you have new symptoms. Your dizziness or light-headedness gets worse. You feel nauseous. You have reduced hearing. You have a fever. You have neck pain or a stiff neck. Your dizziness leads to an injury or a fall. Get help right away if: You vomit or have diarrhea and are unable to eat or drink anything. You have problems talking, walking, swallowing, or using your arms, hands, or legs. You feel generally weak. You have any bleeding. You are not thinking clearly or you have trouble forming sentences. It may take a friend or family member to notice this. You have chest pain, abdominal pain, shortness of breath, or sweating. Your vision changes or you develop a severe headache. These symptoms may represent a serious problem that is an emergency. Do not wait to see if the symptoms will go away. Get medical help right away. Call your local emergency services (911 in the [place] Do not drive yourself to the hospital. Summary Dizziness is a feeling of unsteadiness or light-headedness. This condition can be caused by a number of  things, including medicines, dehydration, or illness. Anyone can become dizzy, but dizziness is more common in older adults. Drink enough fluid to keep your urine pale yellow. Do not drink alcohol. Avoid making quick movements if you feel dizzy. Monitor your dizziness for any changes. This information is not intended to replace advice given to you by your health care provider. Make sure you discuss any questions you have with your health care provider. Document Revised: 09/13/2020 Document Reviewed: 09/13/2020 Elsevier Patient Education  2022 [place],   Merri Ray, MD [facility] 10/28/21 1:52  PM

## 2021-10-28 ENCOUNTER — Encounter: Payer: Self-pay | Admitting: Family Medicine

## 2021-10-31 ENCOUNTER — Telehealth: Payer: Self-pay | Admitting: Cardiovascular Disease

## 2021-10-31 NOTE — Telephone Encounter (Signed)
Pt this Via Mychart message:    Good morning, Timothy Escobar: Thank you for the inquiry.  This morning I have a very slight sensation of dizziness, as I frequently have had for the past month or so. It is not incapacitating. It seems to come and go throughout the day.  It is obvious when I do not have it. It does seem to be mediated by physical activity and singing, or by laying down.  I have not felt faint or fallen down.  The only other symptom I've had is one instance of a strident heart beat not accompanied by an increased heart rate or increased BP, which lasted several hours, on 10/19/21.  I have a record of HR and BP going back to 06/13/21, which I'd be pleased to share with you. I can bring this over today, if you'd like. (My reading this morning was 126/80, HR 61.) Thanks for your attention.  Sincerely,  Timothy Escobar  Can you tell me a little more about the light-headness you are having?      Are you dizzy now?   Do you feel faint or have you passed out?   Do you have any other symptoms?   Have you checked your HR and BP (record if available)?     ----- Message -----       Comments: Light-headedness that seems to be persistent but not incapacitating(i.e., not an emergency). Please see Dr. Vonna Kotyk notes and tests from 10/27/21.

## 2021-10-31 NOTE — Telephone Encounter (Signed)
Returned call to patient of Dr. Claiborne Billings  He reports dizziness for about 1 month, since end of November. The dizziness comes and goes He is pretty active, feels best when active  He notices with quick position changes. Notices feel "unenergetic" also.  He stays well hydrated  Per message below:  "It is not incapacitating. It seems to come and go throughout the day.  It is obvious when I do not have it. It does seem to be mediated by physical activity and singing, or by laying down."  1/9 - BP 126/80 HR 61 (slight dizziness)  1/8 - BP 133/79 HR 57 1/7 -  BP 137/76 HR 62 (not dizzy) 1/6 - BP 133/76 HR 61,  BP 127/73 HR 61 (not dizzy) 1/5 - BP 128/78 HR 57 (Dr. Vonna Kotyk office), BP 133/79 HR 60 (lightheaded) 1/4 - BP 136/73 HR 61, BP 132/71 HR 56 1/3 -  1/2 - BP 138/79 HR 59, BP 153/79 HR 55, BP 138/75 1/1 - BP 135/73 HR 63  He reports he noticed high BPs in early Dec - high [id number] systolic - now on amlodipine 2.5mg  QD as of 10/06/21 based on BP. He reports he increased to whole tablet (5mg ) on 10/15/21 - BP now ranges as noted below in list.   He saw PCP on 10/27/21 - labs and note and EKG in [place] will send message to Dr. Claiborne Billings to review. Nothing obvious on labs, BP readings that could indicate cause of lightheadedness/dizziness.

## 2021-11-02 ENCOUNTER — Other Ambulatory Visit: Payer: Self-pay | Admitting: Cardiovascular Disease

## 2021-11-08 NOTE — Telephone Encounter (Signed)
It doesn't sound cardiac in nature, not tied to lower (or higher) BP readings. Labs done by PCP were all WNL.  Don't see any of his medications that would cause this either.  Suggest he stay well hydrated and check back with PCP.

## 2021-11-09 NOTE — Telephone Encounter (Signed)
Called patient, advised of message from [facility].  Patient states that he is doing well now, and will let us know of any issues.  Patient appreciative of call.

## 2022-01-22 ENCOUNTER — Encounter: Payer: Self-pay | Admitting: Family Medicine

## 2022-01-23 ENCOUNTER — Ambulatory Visit: Payer: Medicare Other

## 2022-01-23 ENCOUNTER — Ambulatory Visit (INDEPENDENT_AMBULATORY_CARE_PROVIDER_SITE_OTHER): Payer: Medicare Other

## 2022-01-23 ENCOUNTER — Encounter: Payer: Self-pay | Admitting: Podiatry

## 2022-01-23 ENCOUNTER — Ambulatory Visit: Payer: Medicare Other | Admitting: Podiatry

## 2022-01-23 DIAGNOSIS — M722 Plantar fascial fibromatosis: Secondary | ICD-10-CM

## 2022-01-23 DIAGNOSIS — M779 Enthesopathy, unspecified: Secondary | ICD-10-CM

## 2022-01-23 DIAGNOSIS — R600 Localized edema: Secondary | ICD-10-CM | POA: Diagnosis not present

## 2022-01-23 DIAGNOSIS — M7751 Other enthesopathy of right foot: Secondary | ICD-10-CM

## 2022-01-23 MED ORDER — TRIAMCINOLONE ACETONIDE 10 MG/ML IJ SUSP
10.0000 mg | Freq: Once | INTRAMUSCULAR | Status: AC
  Administered 2022-01-23: 10 mg

## 2022-01-23 NOTE — Progress Notes (Signed)
Subjective:  ? ?Patient ID: Timothy Escobar, male   DOB: 81 y.o.   MRN: [id number]  ? ?HPI ?Patient stated he started with heel pain and then started to develop discomfort in his right ankle and states its been inflamed and sore and also the outside of his foot and he probably did walk differently.  States that heel still bothers him but nowhere near as bad as it was.  Patient does not smoke likes to be active ? ? ?Review of Systems  ?All other systems reviewed and are negative. ? ? ?   ?Objective:  ?Physical Exam ?Vitals and nursing note reviewed.  ?Constitutional:   ?   Appearance: He is well-developed.  ?Pulmonary:  ?   Effort: Pulmonary effort is normal.  ?Musculoskeletal:     ?   General: Normal range of motion.  ?Skin: ?   General: Skin is warm.  ?Neurological:  ?   Mental Status: He is alert.  ?  ?Neurovascular status intact muscle strength found to be adequate range of motion adequate.  Patient is noted to have inflammation of the right ankle with fluid buildup within the sinus tarsi and also into the lateral ankle ligaments and mild discomfort forefoot not as bad mild discomfort plantar fascia not significant currently.  Good digital perfusion well oriented x3 ? ?   ?Assessment:  ?Inflammatory sinus tarsitis right that most likely is compensatory in its origin along with probable residual fasciitis capsulitis condition ? ?   ?Plan:  ?H&P all conditions reviewed did sterile prep and injected the sinus tarsi right 3 mg Kenalog 5 mg Xylocaine and into the lateral ankle ligaments advised on ice therapy anti-inflammatory support reappoint to recheck in the next several weeks ? ?X-rays are negative for signs of fracture or advanced arthritis associated with condition ?   ? ? ?

## 2022-02-04 ENCOUNTER — Other Ambulatory Visit: Payer: Self-pay | Admitting: Cardiovascular Disease

## 2022-02-06 ENCOUNTER — Encounter: Payer: Self-pay | Admitting: Family Medicine

## 2022-02-06 ENCOUNTER — Telehealth (INDEPENDENT_AMBULATORY_CARE_PROVIDER_SITE_OTHER): Payer: Medicare Other | Admitting: Family Medicine

## 2022-02-06 DIAGNOSIS — I25118 Atherosclerotic heart disease of native coronary artery with other forms of angina pectoris: Secondary | ICD-10-CM

## 2022-02-06 DIAGNOSIS — U071 COVID-19: Secondary | ICD-10-CM

## 2022-02-06 MED ORDER — MOLNUPIRAVIR EUA 200MG CAPSULE: 4.0000 | ORAL_CAPSULE | Freq: Two times a day (BID) | ORAL | 0 refills | Status: AC

## 2022-02-06 NOTE — Patient Instructions (Addendum)
Mucinex or mucinex DM for cough. Drink plenty of fluids. Saline nasal spray if needed.  Antiviral molnupiravir was sent to your pharmacy.  Let me know if there are any questions.  Hope you feel better soon. ?Return to the clinic or go to the nearest emergency room if any of your symptoms worsen or new symptoms occur. ?COVID-19: What to Do if You Are Sick ?If you test positive and are an older adult or someone who is at high risk of getting very sick from COVID-19, treatment may be available. Contact a healthcare provider right away after a positive test to determine if you are eligible, even if your symptoms are mild right now. You can also visit a Test to Treat location and, if eligible, receive a prescription from a provider. Don't delay: Treatment must be started within the first few days to be effective. ?If you have a fever, cough, or other symptoms, you might have COVID-19. Most people have mild illness and are able to recover at home. If you are sick: ?Keep track of your symptoms. ?If you have an emergency warning sign (including trouble breathing), call 911. ?Steps to help prevent the spread of COVID-19 if you are sick ?If you are sick with COVID-19 or think you might have COVID-19, follow the steps below to care for yourself and to help protect other people in your home and community. ?Stay home except to get medical care ?Stay home. Most people with COVID-19 have mild illness and can recover at home without medical care. Do not leave your home, except to get medical care. Do not visit public areas and do not go to places where you are unable to wear a mask. ?Take care of yourself. Get rest and stay hydrated. Take over-the-counter medicines, such as acetaminophen, to help you feel better. ?Stay in touch with your doctor. Call before you get medical care. Be sure to get care if you have trouble breathing, or have any other emergency warning signs, or if you think it is an emergency. ?Avoid public  transportation, ride-sharing, or taxis if possible. ?Get tested ?If you have symptoms of COVID-19, get tested. While waiting for test results, stay away from others, including staying apart from those living in your household. ?Get tested as soon as possible after your symptoms start. Treatments may be available for people with COVID-19 who are at risk for becoming very sick. Don't delay: Treatment must be started early to be effective--some treatments must begin within 5 days of your first symptoms. Contact your healthcare provider right away if your test result is positive to determine if you are eligible. ?Self-tests are one of several options for testing for the virus that causes COVID-19 and may be more convenient than laboratory-based tests and point-of-care tests. Ask your healthcare provider or your local health department if you need help interpreting your test results. ?You can visit your state, tribal, local, and territorial health department's website to look for the latest local information on testing sites. ?Separate yourself from other people ?As much as possible, stay in a specific room and away from other people and pets in your home. If possible, you should use a separate bathroom. If you need to be around other people or animals in or outside of the home, wear a well-fitting mask. ?Tell your close contacts that they may have been exposed to COVID-19. An infected person can spread COVID-19 starting 48 hours (or 2 days) before the person has any symptoms or tests positive. By letting  your close contacts know they may have been exposed to COVID-19, you are helping to protect everyone. ?See COVID-19 and Animals if you have questions about pets. ?If you are diagnosed with COVID-19, someone from the health department may call you. Answer the call to slow the spread. ?Monitor your symptoms ?Symptoms of COVID-19 include fever, cough, or other symptoms. ?Follow care instructions from your healthcare  provider and local health department. Your local health authorities may give instructions on checking your symptoms and reporting information. ?When to seek emergency medical attention ?Look for emergency warning signs* for COVID-19. If someone is showing any of these signs, seek emergency medical care immediately: ?Trouble breathing ?Persistent pain or pressure in the chest ?New confusion ?Inability to wake or stay awake ?Pale, gray, or blue-colored skin, lips, or nail beds, depending on skin tone ?*This list is not all possible symptoms. Please call your medical provider for any other symptoms that are severe or concerning to you. ?Call 911 or call ahead to your local emergency facility: Notify the operator that you are seeking care for someone who has or may have COVID-19. ?Call ahead before visiting your doctor ?Call ahead. Many medical visits for routine care are being postponed or done by phone or telemedicine. ?If you have a medical appointment that cannot be postponed, call your doctor's office, and tell them you have or may have COVID-19. This will help the office protect themselves and other patients. ?If you are sick, wear a well-fitting mask ?You should wear a mask if you must be around other people or animals, including pets (even at home). ?Wear a mask with the best fit, protection, and comfort for you. ?You don't need to wear the mask if you are alone. If you can't put on a mask (because of trouble breathing, for example), cover your coughs and sneezes in some other way. Try to stay at least 6 feet away from other people. This will help protect the people around you. ?Masks should not be placed on young children under age 41 years, anyone who has trouble breathing, or anyone who is not able to remove the mask without help. ?Cover your coughs and sneezes ?Cover your mouth and nose with a tissue when you cough or sneeze. ?Throw away used tissues in a lined trash can. ?Immediately wash your hands with  soap and water for at least 20 seconds. If soap and water are not available, clean your hands with an alcohol-based hand sanitizer that contains at least 60% alcohol. ?Clean your hands often ?Wash your hands often with soap and water for at least 20 seconds. This is especially important after blowing your nose, coughing, or sneezing; going to the bathroom; and before eating or preparing food. ?Use hand sanitizer if soap and water are not available. Use an alcohol-based hand sanitizer with at least 60% alcohol, covering all surfaces of your hands and rubbing them together until they feel dry. ?Soap and water are the best option, especially if hands are visibly dirty. ?Avoid touching your eyes, nose, and mouth with unwashed hands. ?Handwashing Tips ?Avoid sharing personal household items ?Do not share dishes, drinking glasses, cups, eating utensils, towels, or bedding with other people in your home. ?Wash these items thoroughly after using them with soap and water or put in the dishwasher. ?Clean surfaces in your home regularly ?Clean and disinfect high-touch surfaces (for example, doorknobs, tables, handles, light switches, and countertops) in your "sick room" and bathroom. In shared spaces, you should clean and disinfect surfaces  and items after each use by the person who is ill. ?If you are sick and cannot clean, a caregiver or other person should only clean and disinfect the area around you (such as your bedroom and bathroom) on an as needed basis. Your caregiver/other person should wait as long as possible (at least several hours) and wear a mask before entering, cleaning, and disinfecting shared spaces that you use. ?Clean and disinfect areas that may have blood, stool, or body fluids on them. ?Use household cleaners and disinfectants. Clean visible dirty surfaces with household cleaners containing soap or detergent. Then, use a household disinfectant. ?Use a product from [employer]: Disinfectants for  Coronavirus (SVEXO-60). ?Be sure to follow the instructions on the label to ensure safe and effective use of the product. Many products recommend keeping the surface wet with a disinfectant for a certain period of time (look at

## 2022-02-06 NOTE — Progress Notes (Addendum)
Virtual Visit via Video Note ? ?I connected with Timothy Escobar on 02/06/22 at 5:22 PM by a video enabled telemedicine application and verified that I am speaking with the correct person using two identifiers.  ?Patient location:home with wife - consent obtained  ?My location: office - [place].  ?  ?I discussed the limitations, risks, security and privacy concerns of performing an evaluation and management service by telephone and the availability of in person appointments. I also discussed with the patient that there may be a patient responsible charge related to this service. The patient expressed understanding and agreed to proceed, consent obtained ? ?Chief complaint: ? ?Chief Complaint  ?Patient presents with  ? Covid Positive  ?  Pt reports tested positive on Sunday, reports sxs started earlier Sunday, notes throat tightness, denies nausea and vomiting, notes fatigue, cough  ? ? ?History of Present Illness: ?Timothy Escobar is a 81 y.o. male ? ?Covid 19 infection: ?Initial symptoms 2 days ago. Working outside. Felt like bronchial tubes were heavy. Yesterday felt more fatigue. Covid test positive yesterday.  Temp 99.4 then.  ?Body aches, fatigue. Minimal cough. Min yellow mucus.  ?No dyspnea, chest pain, or confusion.  ?Drinking fluids normally.  ? ?Planned trip to [place] 4/24. 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  ? ?COVID risk of complication score 5 ?No prior covid infection.  ?UTD on covid vaccine and booster. Most recent booster 06/30/21 - bivalent booster.  ?Last took lipitor 12 hours ago.  ?Tx: afrin - day 2-3. ? ?Egfr 82 on 10/27/21.  ?Interaction with Brilinta and paxlovid.  ? ? ? ?Immunization History  ?Administered Date(s) Administered  ? Fluad Quad(high Dose 65+) 07/25/2019, 07/21/2020  ? Influenza,inj,Quad PF,6+ Mos 07/18/2013, 07/29/2014, 08/30/2015, 07/20/2016, 08/01/2017, 08/21/2018  ? Influenza-Unspecified 07/23/2012, 07/23/2021  ? PFIZER(Purple Top)SARS-COV-2 Vaccination  11/12/2019, 11/30/2019, 07/20/2020, 02/02/2021  ? Pneumococcal Conjugate-13 12/18/2017  ? Pneumococcal Polysaccharide-23 05/13/2008  ? Td 05/13/2008  ? Tdap 09/26/2012  ? Zoster, Live 10/23/2009  ? ? ? ? ?Patient Active Problem List  ? Diagnosis Date Noted  ? Bacterial conjunctivitis of both eyes 01/23/2018  ? Sinus bradycardia 01/23/2017  ? Acute bronchitis 11/21/2016  ? Dyslipidemia   ? CAD (coronary artery disease)   ? Status post coronary artery stent placement   ? Bradycardia   ? Hypokalemia   ? Hyperlipidemia with target LDL less than 70   ? STEMI (ST elevation myocardial infarction) ([place]) 08/22/2016  ? Acute ST elevation myocardial infarction (STEMI) involving left anterior descending (LAD) coronary artery ([place]) 08/22/2016  ? Hip pain 09/28/2012  ? Chronic depression 09/28/2012  ? ?Past Medical History:  ?Diagnosis Date  ? Blood transfusion without reported diagnosis   ? CAD (coronary artery disease)   ? a. 08/22/2016 Ant STEMI/PCI: LM nl, LAD 30ost, 100p (3.0x15 Synergy DES & 2.75x24 Synergy DES), LCX large/dominant/nl, RCA nondom, nl;  b. 08/2016 Echo: Ef 60-65%, no rwma.  ? Cataract   ? Phreesia 03/29/2020  ? Depression   ? Dyslipidemia   ? Heart murmur   ? Phreesia 03/29/2020  ? Myocardial infarction [facility])   ? Phreesia 03/29/2020  ? Skin cancer   ? skin cancer  ? ?Past Surgical History:  ?Procedure Laterality Date  ? APPENDECTOMY    ? CARDIAC CATHETERIZATION N/A 08/22/2016  ? Procedure: Left Heart Cath and Coronary Angiography;  Surgeon: Troy Sine, MD;  Location: [place] [facility];  Service: Cardiovascular;  Laterality: N/A;  ? CARDIAC CATHETERIZATION N/A 08/22/2016  ? Procedure:  Coronary Stent Intervention;  Surgeon: Troy Sine, MD;  Location: [place] [facility];  Service: Cardiovascular;  Laterality: N/A;  MID LAD  ? COLON SURGERY N/A   ? Phreesia 03/29/2020  ? HERNIA REPAIR    ? parotid gland cyst removal Right 2012  ? vocal fold polyp removed    ? ?No Known Allergies ?Prior to Admission  medications   ?Medication Sig Start Date End Date Taking? Authorizing Provider  ?ticagrelor (BRILINTA) 60 MG TABS tablet Take 1 tablet by mouth twice daily 02/06/22   Troy Sine, MD  ?amLODipine (NORVASC) 5 MG tablet Take 0.5 tablet (2.5 mg) for 1 week, if systolic above 188 increase to 1 tablet (5 mg). 10/06/21   Troy Sine, MD  ?aspirin EC 81 MG EC tablet Take 1 tablet (81 mg total) by mouth daily. 08/26/16   Maryellen Pile, MD  ?atorvastatin (LIPITOR) 80 MG tablet TAKE 1 TABLET BY MOUTH ONCE DAILY AT  [facility] 09/21/21   Troy Sine, MD  ?calcium-vitamin D (OSCAL WITH D) 500-200 MG-UNIT per tablet Take 1 tablet by mouth daily. 1,000 of Vit D3    [provider]  ?cholecalciferol (VITAMIN D) 1000 units tablet Take 1,000 Units by mouth daily.    [provider]  ?diclofenac Sodium (VOLTAREN) 1 % GEL Apply 2 g topically 4 (four) times daily. 08/26/21   Lynden Oxford Scales, PA-C  ?FLUoxetine (PROZAC) 20 MG capsule Take 1 capsule (20 mg total) by mouth daily. 05/09/21   Wendie Agreste, MD  ?Ginger Root POWD by Does not apply route. Mixes 1/2 teaspoon of powder into hot tea once daily    [provider]  ?Multiple Vitamins-Minerals (MULTIVITAMIN WITH MINERALS) tablet Take 1 tablet by mouth once a week. Centrum Silver    [provider]  ?nitroGLYCERIN (NITROSTAT) 0.4 MG SL tablet Place 1 tablet (0.4 mg total) under the tongue every 5 (five) minutes x 3 doses as needed for chest pain. 03/03/20   Troy Sine, MD  ?predniSONE (DELTASONE) 20 MG tablet Take 2 tablets (40 mg total) by mouth daily with breakfast. 10/13/21   Wendie Agreste, MD  ?vitamin C (ASCORBIC ACID) 500 MG tablet Take 500 mg by mouth daily.    [provider]  ? ?Social History  ? ?Socioeconomic History  ? Marital status: Married  ?  Spouse name: Not on file  ? Number of children: 2  ? Years of education: college  ? Highest education level: Not on file  ?Occupational History  ? Occupation:  Retired - Water quality scientist  ?Tobacco Use  ? Smoking status: Former  ?  Types: Cigarettes  ?  Quit date: 01/22/1967  ?  Years since quitting: 55.0  ? Smokeless tobacco: Never  ?Vaping Use  ? Vaping Use: Never used  ?Substance and Sexual Activity  ? Alcohol use: Yes  ?  Alcohol/week: 7.0 standard drinks  ?  Types: 7 Standard drinks or equivalent per week  ?  Comment: wine: 1 glass daily  ? Drug use: Not on file  ? Sexual activity: Never  ?Other Topics Concern  ? Not on file  ?Social History Narrative  ? Lives w/ wife in [place]. 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 and with [employer], plus the church choir.  ? Exercise walk/gardening daily for 1 hr plus  ? ?Social Determinants of Health  ? ?Financial Resource Strain: Low Risk   ? Difficulty of Paying Living Expenses:  Not hard at all  ?Food Insecurity: No Food Insecurity  ? Worried About Charity fundraiser in the Last Year: Never true  ? Ran Out of Food in the Last Year: Never true  ?Transportation Needs: No Transportation Needs  ? Lack of Transportation (Medical): No  ? Lack of Transportation (Non-Medical): No  ?Physical Activity: Sufficiently Active  ? Days of Exercise per Week: 7 days  ? Minutes of Exercise per Session: 30 min  ?Stress: No Stress Concern Present  ? Feeling of Stress : Not at all  ?Social Connections: Moderately Integrated  ? Frequency of Communication with Friends and Family: Twice a week  ? Frequency of Social Gatherings with Friends and Family: Twice a week  ? Attends Religious Services: Never  ? Active Member of Clubs or Organizations: Yes  ? Attends Archivist Meetings: 1 to 4 times per year  ? Marital Status: Married  ?Intimate Partner Violence: Not At Risk  ? Fear of Current or Ex-Partner: No  ? Emotionally Abused: No  ? Physically Abused: No  ? Sexually Abused: No  ? ? ?Observations/Objective: ?There were no vitals filed for this visit. ?Nontoxic appearance on video.  Speaking in full sentences  without respiratory distress, no cough appreciated during exam.  Appropriate, coherent responses.  All questions were answered with understanding of plan expressed. ? ?Assessment and Plan: ?COVID-19 virus infection - P

## 2022-02-07 ENCOUNTER — Telehealth: Payer: Self-pay | Admitting: Family Medicine

## 2022-02-07 NOTE — Telephone Encounter (Signed)
Patient Name Timothy Escobar ?Patient DOB [birth year] ?Call Type Message Only Information Provided ?Reason for Call Request to Speak to a Physician ?Initial Comment Caller states he will not try to do the virtual visit with Merri Ray. He had an appointment ?at 420pm for a teleheath virtual visit. He requests that Dr. Carlota Raspberry call him tomorrow morning. ?The caller says he has a positive COVID test. ?Additional Comment The caller says the video shut down and he wanted to speak with his doctor about his COVID ?diagnosis. He is planning to travel next week. ?Disp. Time Disposition Final User ?02/06/2022 5:15:25 PM General Information Provided Yes Valere Dross ?Call Closed By: Valere Dross ?Transaction Date/Time: 02/06/2022 5:07:09 PM (ET ?

## 2022-02-07 NOTE — Telephone Encounter (Signed)
Visit is signed did you speak with this patient at visit 02/07/22 ? ?

## 2022-02-07 NOTE — Telephone Encounter (Signed)
Yes, I did call patient and reconnected video.  ?

## 2022-04-07 ENCOUNTER — Encounter: Payer: Self-pay | Admitting: Podiatry

## 2022-04-07 ENCOUNTER — Ambulatory Visit: Payer: Medicare Other | Admitting: Podiatry

## 2022-04-07 DIAGNOSIS — M7751 Other enthesopathy of right foot: Secondary | ICD-10-CM

## 2022-04-07 MED ORDER — TRIAMCINOLONE ACETONIDE 10 MG/ML IJ SUSP
10.0000 mg | Freq: Once | INTRAMUSCULAR | Status: AC
  Administered 2022-04-07: 10 mg

## 2022-04-07 NOTE — Progress Notes (Signed)
Subjective:   Patient ID: Timothy Escobar, male   DOB: 81 y.o.   MRN: [id number]   HPI Patient states my ankle is improved but I do have an area that still gets sore it seems to be more within the deep ankle joint itself   ROS      Objective:  Physical Exam  Neurovascular status intact with the sinus tarsi right seemed to be somewhat better but more pain into the lateral ankle gutter and into the subtalar joint itself     Assessment:  Inflammatory condition improving sinus tarsi but painful into the lateral ankle gutter     Plan:  Explained chances for arthritis but we are trying to hold off on any kind of surgical correction or CT MRI due to advanced age and chances are that a fusion would not be realistic.  Today I did go ahead and I did do sterile prep injected the lateral ankle gutter and sinus tarsi also 3 mg Kenalog 5 mg Xylocaine tolerated well hopefully once every 4 months we will keep this under control

## 2022-05-10 ENCOUNTER — Ambulatory Visit (INDEPENDENT_AMBULATORY_CARE_PROVIDER_SITE_OTHER): Payer: Medicare Other | Admitting: Family Medicine

## 2022-05-10 ENCOUNTER — Encounter: Payer: Self-pay | Admitting: Family Medicine

## 2022-05-10 DIAGNOSIS — F32A Depression, unspecified: Secondary | ICD-10-CM | POA: Diagnosis not present

## 2022-05-10 MED ORDER — FLUOXETINE HCL 20 MG PO CAPS: 20.0000 mg | ORAL_CAPSULE | Freq: Every day | ORAL | 3 refills | Status: DC

## 2022-05-10 NOTE — Progress Notes (Signed)
Subjective:  Patient ID: Timothy Escobar, male    DOB: 1941-03-12  Age: 81 y.o. MRN: [id number]  CC:  Chief Complaint  Patient presents with   Depression    Situational stress with moving     HPI Timothy Escobar presents for   COVID-19 infection in April. Some prolonged vocal cord irritation, but back to normal, no residual symptoms.   Cardiac Followed by Dr. Tresa Endo, history of STEMI, aortic atherosclerosis, aortic valve sclerosis, hypertension.  Treated with amlodipine, Lipitor, Brilinta and ASA.  No CP/dyspnea, or new side effects with meds.  Home BP has been normal.   Depression: Treated with fluoxetine, 20 mg daily. Working well.  Woke up early today, some stress past few weeks - selling home, and closing on town home. Very active with all that needs to be done.  Overall fluoxetine has been working well. Stress recent and situational - should improve soon.  Active with singing, plans on painting, continued reading.      05/10/2022    1:40 PM 10/27/2021    9:58 AM 10/13/2021    2:40 PM 10/06/2021    1:42 PM 10/06/2021    1:38 PM  Depression screen PHQ 2/9  Decreased Interest 0 0 0 0 0  Down, Depressed, Hopeless 1 0 0 0 0  PHQ - 2 Score 1 0 0 0 0  Altered sleeping 1  0    Tired, decreased energy 0  0    Change in appetite 0  0    Feeling bad or failure about yourself  0  0    Trouble concentrating 0  0    Moving slowly or fidgety/restless 0  0    Suicidal thoughts 0  0    PHQ-9 Score 2  0    Difficult doing work/chores   Not difficult at all       History Patient Active Problem List   Diagnosis Date Noted   Bacterial conjunctivitis of both eyes 01/23/2018   Sinus bradycardia 01/23/2017   Acute bronchitis 11/21/2016   Dyslipidemia    CAD (coronary artery disease)    Status post coronary artery stent placement    Bradycardia    Hypokalemia    Hyperlipidemia with target LDL less than 70    STEMI (ST elevation myocardial infarction) (HCC) 08/22/2016   Acute  ST elevation myocardial infarction (STEMI) involving left anterior descending (LAD) coronary artery (HCC) 08/22/2016   Hip pain 09/28/2012   Chronic depression 09/28/2012   Past Medical History:  Diagnosis Date   Blood transfusion without reported diagnosis    CAD (coronary artery disease)    a. 08/22/2016 Ant STEMI/PCI: LM nl, LAD 30ost, 100p (3.0x15 Synergy DES & 2.75x24 Synergy DES), LCX large/dominant/nl, RCA nondom, nl;  b. 08/2016 Echo: Ef 60-65%, no rwma.   Cataract    Phreesia 03/29/2020   Depression    Dyslipidemia    Heart murmur    Phreesia 03/29/2020   Myocardial infarction [facility])    Phreesia 03/29/2020   Skin cancer    skin cancer   Past Surgical History:  Procedure Laterality Date   APPENDECTOMY     CARDIAC CATHETERIZATION N/A 08/22/2016   Procedure: Left Heart Cath and Coronary Angiography;  Surgeon: Lennette Bihari, MD;  Location: [facility];  Service: Cardiovascular;  Laterality: N/A;   CARDIAC CATHETERIZATION N/A 08/22/2016   Procedure: Coronary Stent Intervention;  Surgeon: Lennette Bihari, MD;  Location: [facility] CV [facility];  Service: Cardiovascular;  Laterality: N/A;  MID LAD   COLON SURGERY N/A    Phreesia 03/29/2020   HERNIA REPAIR     parotid gland cyst removal Right 2012   vocal fold polyp removed     No Known Allergies Prior to Admission medications   Medication Sig Start Date End Date Taking? Authorizing Provider  amLODipine (NORVASC) 5 MG tablet Take 0.5 tablet (2.5 mg) for 1 week, if systolic above 697 increase to 1 tablet (5 mg). 10/06/21  Yes Troy Sine, MD  aspirin EC 81 MG EC tablet Take 1 tablet (81 mg total) by mouth daily. 08/26/16  Yes Maryellen Pile, MD  atorvastatin (LIPITOR) 80 MG tablet TAKE 1 TABLET BY MOUTH ONCE DAILY AT  6PM 09/21/21  Yes Troy Sine, MD  cholecalciferol (VITAMIN D) 1000 units tablet Take 1,000 Units by mouth daily.   Yes [provider]  FLUoxetine (PROZAC) 20 MG capsule Take 1 capsule (20 mg  total) by mouth daily. 05/09/21  Yes Wendie Agreste, MD  Ginger Root POWD by Does not apply route. Mixes 1/2 teaspoon of powder into hot tea once daily   Yes [provider]  Multiple Vitamins-Minerals (MULTIVITAMIN WITH MINERALS) tablet Take 1 tablet by mouth once a week. Centrum Silver   Yes [provider]  nitroGLYCERIN (NITROSTAT) 0.4 MG SL tablet Place 1 tablet (0.4 mg total) under the tongue every 5 (five) minutes x 3 doses as needed for chest pain. 03/03/20  Yes Troy Sine, MD  ticagrelor [facility]) 60 MG TABS tablet Take 1 tablet by mouth twice daily 02/06/22  Yes Troy Sine, MD  vitamin C (ASCORBIC ACID) 500 MG tablet Take 500 mg by mouth daily.   Yes [provider]  calcium-vitamin D (OSCAL WITH D) 500-200 MG-UNIT per tablet Take 1 tablet by mouth daily. 1,000 of Vit D3 Patient not taking: Reported on 05/10/2022    [provider]  SYSTANE ULTRA 0.4-0.3 % SOLN SMARTSIG:1 Drop(s) In Eye(s) PRN 01/05/22   [provider]  valACYclovir (VALTREX) 1000 MG tablet Take by mouth. 01/05/22   [provider]   Social History   Socioeconomic History   Marital status: Married    Spouse name: Not on file   Number of children: 2   Years of education: college   Highest education level: Not on file  Occupational History   Occupation: Retired - Water quality scientist  Tobacco Use   Smoking status: Former    Types: Cigarettes    Quit date: 01/22/1967    Years since quitting: 55.3   Smokeless tobacco: Never  Vaping Use   Vaping Use: Never used  Substance and Sexual Activity   Alcohol use: Yes    Alcohol/week: 7.0 standard drinks of alcohol    Types: 7 Standard drinks or equivalent per week    Comment: wine: 1 glass daily   Drug use: Not on file   Sexual activity: Never  Other Topics Concern   Not on file  Social History Narrative   Lives w/ wife in [place]. Both are singers, they sing with the [employer] in [place] and with [employer], plus the church choir.   Exercise walk/gardening daily for 1 hr plus   Social Determinants of Health   Financial Resource Strain: Low Risk  (10/06/2021)   Overall Financial Resource Strain (CARDIA)    Difficulty of Paying Living Expenses: Not hard at all  Food Insecurity: No Food Insecurity (10/06/2021)   Hunger Vital Sign  Worried About Charity fundraiser in the Last Year: Never true    [place] in the Last Year: Never true  Transportation Needs: No Transportation Needs (10/06/2021)   PRAPARE - Hydrologist (Medical): No    Lack of Transportation (Non-Medical): No  Physical Activity: Sufficiently Active (10/06/2021)   Exercise Vital Sign    Days of Exercise per Week: 7 days    Minutes of Exercise per Session: 30 min  Stress: No Stress Concern Present (10/06/2021)   [place]    Feeling of Stress : Not at all  Social Connections: Moderately Integrated (10/06/2021)   Social Connection and Isolation Panel [NHANES]    Frequency of Communication with Friends and Family: Twice a week    Frequency of Social Gatherings with Friends and Family: Twice a week    Attends Religious Services: Never    Marine scientist or Organizations: Yes    Attends Archivist Meetings: 1 to 4 times per year    Marital Status: Married  Human resources officer Violence: Not At Risk (10/06/2021)   Humiliation, Afraid, Rape, and Kick questionnaire    Fear of Current or Ex-Partner: No    Emotionally Abused: No    Physically Abused: No    Sexually Abused: No    Review of Systems Per HPI.   Objective:   Vitals:   05/10/22 1335  BP: 118/70  Pulse: (!) 56  Resp: 20  Temp: 98 F (36.7 C)  TempSrc: Oral  SpO2: 96%  Weight: 167 lb 12.8 oz (76.1 kg)  Height: $Remove'5\' 8"'[id number]$  (1.727 m)     Physical Exam Vitals reviewed.  Constitutional:      Appearance: He is well-developed.   HENT:     Head: Normocephalic and atraumatic.  Neck:     Vascular: No carotid bruit or JVD.  Cardiovascular:     Rate and Rhythm: Normal rate and regular rhythm.     Heart sounds: Normal heart sounds. No murmur heard. Pulmonary:     Effort: Pulmonary effort is normal.     Breath sounds: Normal breath sounds. No rales.  Musculoskeletal:     Right lower leg: No edema.     Left lower leg: No edema.  Skin:    General: Skin is warm and dry.  Neurological:     Mental Status: He is alert and oriented to person, place, and time.  Psychiatric:        Mood and Affect: Mood normal.        Behavior: Behavior normal.        Thought Content: Thought content normal.     Assessment & Plan:  Timothy Escobar is a 81 y.o. male . Depression, unspecified depression type - Plan: FLUoxetine (PROZAC) 20 MG capsule  -Overall doing well, continue same dose fluoxetine.  Recent situational stressors with new townhome and downsizing from current home.  Management techniques discussed and handout given if needed for stress management but overall seems to be handling well.    No residual symptoms from COVID infection.  Has routine follow-up with cardiology, no med changes or labs today.  35-month follow-up for physical.  Meds ordered this encounter  Medications   FLUoxetine (PROZAC) 20 MG capsule    Sig: Take 1 capsule (20 mg total) by mouth daily.    Dispense:  90 capsule    Refill:  3   Patient Instructions  No  change in meds at this time. Good luck with the move. Make sure to take some time to rest with all that is going on. Take care!  Managing Stress, Adult Feeling a certain amount of stress is normal. Stress helps our body and mind get ready to deal with the demands of life. Stress hormones can motivate you to do well at work and meet your responsibilities. But severe or long-term (chronic) stress can affect your mental and physical health. Chronic stress puts you at higher risk for: Anxiety and  depression. Other health problems such as digestive problems, muscle aches, heart disease, high blood pressure, and stroke. What are the causes? Common causes of stress include: Demands from work, such as deadlines, feeling overworked, or having long hours. Pressures at home, such as money issues, disagreements with a spouse, or parenting issues. Pressures from major life changes, such as divorce, moving, loss of a loved one, or chronic illness. You may be at higher risk for stress-related problems if you: Do not get enough sleep. Are in poor health. Do not have emotional support. Have a mental health disorder such as anxiety or depression. How to recognize stress Stress can make you: Have trouble sleeping. Feel sad, anxious, irritable, or overwhelmed. Lose your appetite. Overeat or want to eat unhealthy foods. Want to use drugs or alcohol. Stress can also cause physical symptoms, such as: Sore, tense muscles, especially in the shoulders and neck. Headaches. Trouble breathing. A faster heart rate. Stomach pain, nausea, or vomiting. Diarrhea or constipation. Trouble concentrating. Follow these instructions at home: Eating and drinking Eat a healthy diet. This includes: Eating foods that are high in fiber, such as beans, whole grains, and fresh fruits and vegetables. Limiting foods that are high in fat and processed sugars, such as fried or sweet foods. Do not skip meals or overeat. Drink enough fluid to keep your urine pale yellow. Alcohol use Do not drink alcohol if: Your health care provider tells you not to drink. You are pregnant, may be pregnant, or are planning to become pregnant. Drinking alcohol is a way some people try to ease their stress. This can be dangerous, so if you drink alcohol: Limit how much you have to: 0-1 drink a day for women. 0-2 drinks a day for men. Know how much alcohol is in your drink. In the [place] one drink equals one 12 oz bottle of beer (355  mL), one 5 oz glass of wine (148 mL), or one 1 oz glass of hard liquor (44 mL). Activity  Include 30 minutes of exercise in your daily schedule. Exercise is a good stress reducer. Include time in your day for an activity that you find relaxing. Try taking a walk, going on a bike ride, reading a book, or listening to music. Schedule your time in a way that lowers stress, and keep a regular schedule. Focus on doing what is most important to get done. Lifestyle Identify the source of your stress and your reaction to it. See a therapist who can help you change unhelpful reactions. When there are stressful events: Talk about them with family, friends, or coworkers. Try to think realistically about stressful events and not ignore them or overreact. Try to find the positives in a stressful situation and not focus on the negatives. Cut back on responsibilities at work and home, if possible. Ask for help from friends or family members if you need it. Find ways to manage stress, such as: Mindfulness, meditation, or deep breathing. Yoga  or tai chi. Progressive muscle relaxation. Spending time in nature. Doing art, playing music, or reading. Making time for fun activities. Spending time with family and friends. Get support from family, friends, or spiritual resources. General instructions Get enough sleep. Try to go to sleep and get up at about the same time every day. Take over-the-counter and prescription medicines only as told by your health care provider. Do not use any products that contain nicotine or tobacco. These products include cigarettes, chewing tobacco, and vaping devices, such as e-cigarettes. If you need help quitting, ask your health care provider. Do not use drugs or smoke to deal with stress. Keep all follow-up visits. This is important. Where to find support Talk with your health care provider about stress management or finding a support group. Find a therapist to work with you  on your stress management techniques. Where to find more information [employer] on Mental Illness: www.nami.org American Psychological Association: TVStereos.ch Contact a health care provider if: Your stress symptoms get worse. You are unable to manage your stress at home. You are struggling to stop using drugs or alcohol. Get help right away if: You may be a danger to yourself or others. You have any thoughts of death or suicide. Get help right awayif you feel like you may hurt yourself or others, or have thoughts about taking your own life. Go to your nearest emergency room or: Call 911. Call the [place] at 858-118-1547 or 988 in the [place] This is open 24 hours a day. Text the Crisis Text Line at 551-773-8262. Summary Feeling a certain amount of stress is normal, but severe or long-term (chronic) stress can affect your mental and physical health. Chronic stress can put you at higher risk for anxiety, depression, and other health problems such as digestive problems, muscle aches, heart disease, high blood pressure, and stroke. You may be at higher risk for stress-related problems if you do not get enough sleep, are in poor health, lack emotional support, or have a mental health disorder such as anxiety or depression. Identify the source of your stress and your reaction to it. Try talking about stressful events with family, friends, or coworkers, finding a coping method, or getting support from spiritual resources. If you need more help, talk with your health care provider about finding a support group or a mental health therapist. This information is not intended to replace advice given to you by your health care provider. Make sure you discuss any questions you have with your health care provider. Document Revised: 05/05/2021 Document Reviewed: 05/03/2021 Elsevier Patient Education  [place],   Merri Ray, MD [place], [facility] 05/10/22 2:30 PM

## 2022-05-10 NOTE — Patient Instructions (Addendum)
No change in meds at this time. Good luck with the move. Make sure to take some time to rest with all that is going on. Take care!  Managing Stress, Adult Feeling a certain amount of stress is normal. Stress helps our body and mind get ready to deal with the demands of life. Stress hormones can motivate you to do well at work and meet your responsibilities. But severe or long-term (chronic) stress can affect your mental and physical health. Chronic stress puts you at higher risk for: Anxiety and depression. Other health problems such as digestive problems, muscle aches, heart disease, high blood pressure, and stroke. What are the causes? Common causes of stress include: Demands from work, such as deadlines, feeling overworked, or having long hours. Pressures at home, such as money issues, disagreements with a spouse, or parenting issues. Pressures from major life changes, such as divorce, moving, loss of a loved one, or chronic illness. You may be at higher risk for stress-related problems if you: Do not get enough sleep. Are in poor health. Do not have emotional support. Have a mental health disorder such as anxiety or depression. How to recognize stress Stress can make you: Have trouble sleeping. Feel sad, anxious, irritable, or overwhelmed. Lose your appetite. Overeat or want to eat unhealthy foods. Want to use drugs or alcohol. Stress can also cause physical symptoms, such as: Sore, tense muscles, especially in the shoulders and neck. Headaches. Trouble breathing. A faster heart rate. Stomach pain, nausea, or vomiting. Diarrhea or constipation. Trouble concentrating. Follow these instructions at home: Eating and drinking Eat a healthy diet. This includes: Eating foods that are high in fiber, such as beans, whole grains, and fresh fruits and vegetables. Limiting foods that are high in fat and processed sugars, such as fried or sweet foods. Do not skip meals or overeat. Drink  enough fluid to keep your urine pale yellow. Alcohol use Do not drink alcohol if: Your health care provider tells you not to drink. You are pregnant, may be pregnant, or are planning to become pregnant. Drinking alcohol is a way some people try to ease their stress. This can be dangerous, so if you drink alcohol: Limit how much you have to: 0-1 drink a day for women. 0-2 drinks a day for men. Know how much alcohol is in your drink. In the [place] one drink equals one 12 oz bottle of beer (355 mL), one 5 oz glass of wine (148 mL), or one 1 oz glass of hard liquor (44 mL). Activity  Include 30 minutes of exercise in your daily schedule. Exercise is a good stress reducer. Include time in your day for an activity that you find relaxing. Try taking a walk, going on a bike ride, reading a book, or listening to music. Schedule your time in a way that lowers stress, and keep a regular schedule. Focus on doing what is most important to get done. Lifestyle Identify the source of your stress and your reaction to it. See a therapist who can help you change unhelpful reactions. When there are stressful events: Talk about them with family, friends, or coworkers. Try to think realistically about stressful events and not ignore them or overreact. Try to find the positives in a stressful situation and not focus on the negatives. Cut back on responsibilities at work and home, if possible. Ask for help from friends or family members if you need it. Find ways to manage stress, such as: Mindfulness, meditation, or deep breathing. Yoga  or tai chi. Progressive muscle relaxation. Spending time in nature. Doing art, playing music, or reading. Making time for fun activities. Spending time with family and friends. Get support from family, friends, or spiritual resources. General instructions Get enough sleep. Try to go to sleep and get up at about the same time every day. Take over-the-counter and prescription  medicines only as told by your health care provider. Do not use any products that contain nicotine or tobacco. These products include cigarettes, chewing tobacco, and vaping devices, such as e-cigarettes. If you need help quitting, ask your health care provider. Do not use drugs or smoke to deal with stress. Keep all follow-up visits. This is important. Where to find support Talk with your health care provider about stress management or finding a support group. Find a therapist to work with you on your stress management techniques. Where to find more information [employer] on Mental Illness: www.nami.org American Psychological Association: TVStereos.ch Contact a health care provider if: Your stress symptoms get worse. You are unable to manage your stress at home. You are struggling to stop using drugs or alcohol. Get help right away if: You may be a danger to yourself or others. You have any thoughts of death or suicide. Get help right awayif you feel like you may hurt yourself or others, or have thoughts about taking your own life. Go to your nearest emergency room or: Call 911. Call the [place] at 215-121-4952 or 988 in the [place] This is open 24 hours a day. Text the Crisis Text Line at 618-399-1326. Summary Feeling a certain amount of stress is normal, but severe or long-term (chronic) stress can affect your mental and physical health. Chronic stress can put you at higher risk for anxiety, depression, and other health problems such as digestive problems, muscle aches, heart disease, high blood pressure, and stroke. You may be at higher risk for stress-related problems if you do not get enough sleep, are in poor health, lack emotional support, or have a mental health disorder such as anxiety or depression. Identify the source of your stress and your reaction to it. Try talking about stressful events with family, friends, or coworkers, finding a coping method,  or getting support from spiritual resources. If you need more help, talk with your health care provider about finding a support group or a mental health therapist. This information is not intended to replace advice given to you by your health care provider. Make sure you discuss any questions you have with your health care provider. Document Revised: 05/05/2021 Document Reviewed: 05/03/2021 Elsevier Patient Education  [place].

## 2022-06-16 ENCOUNTER — Other Ambulatory Visit: Payer: Self-pay | Admitting: Cardiovascular Disease

## 2022-07-15 ENCOUNTER — Encounter: Payer: Self-pay | Admitting: Cardiovascular Disease

## 2022-07-15 DIAGNOSIS — E785 Hyperlipidemia, unspecified: Secondary | ICD-10-CM

## 2022-07-15 DIAGNOSIS — I700 Atherosclerosis of aorta: Secondary | ICD-10-CM

## 2022-07-20 ENCOUNTER — Ambulatory Visit: Payer: Medicare Other | Admitting: Podiatry

## 2022-07-20 ENCOUNTER — Encounter: Payer: Self-pay | Admitting: Podiatry

## 2022-07-20 DIAGNOSIS — M7671 Peroneal tendinitis, right leg: Secondary | ICD-10-CM

## 2022-07-20 LAB — LIPID PANEL
Chol/HDL Ratio: 2 ratio (ref 0.0–5.0)
Cholesterol, Total: 117 mg/dL (ref 100–199)
HDL: 60 mg/dL (ref >39)
LDL Chol Calc (NIH): 46 mg/dL (ref 0–99)
Triglycerides: 47 mg/dL (ref 0–149)
VLDL Cholesterol Cal: 11 mg/dL (ref 5–40)

## 2022-07-20 LAB — CBC
Hematocrit: 41.6 % (ref 37.5–51.0)
Hemoglobin: 14.2 g/dL (ref 13.0–17.7)
MCH: 32.9 pg (ref 26.6–33.0)
MCHC: 34.1 g/dL (ref 31.5–35.7)
MCV: 97 fL (ref 79–97)
Platelets: 157 10*3/uL (ref 150–450)
RBC: 4.31 x10E6/uL (ref 4.14–5.80)
RDW: 12.2 % (ref 11.6–15.4)
WBC: 4.5 10*3/uL (ref 3.4–10.8)

## 2022-07-20 LAB — TSH: TSH: 1.04 u[IU]/mL (ref 0.450–4.500)

## 2022-07-20 LAB — COMPREHENSIVE METABOLIC PANEL
ALT: 18 IU/L (ref 0–44)
AST: 21 IU/L (ref 0–40)
Albumin/Globulin Ratio: 2.5 — ABNORMAL HIGH (ref 1.2–2.2)
Albumin: 4.3 g/dL (ref 3.8–4.8)
Alkaline Phosphatase: 83 IU/L (ref 44–121)
BUN/Creatinine Ratio: 18 (ref 10–24)
BUN: 16 mg/dL (ref 8–27)
Bilirubin Total: 0.8 mg/dL (ref 0.0–1.2)
CO2: 26 mmol/L (ref 20–29)
Calcium: 8.9 mg/dL (ref 8.6–10.2)
Chloride: 106 mmol/L (ref 96–106)
Creatinine, Ser: 0.91 mg/dL (ref 0.76–1.27)
Globulin, Total: 1.7 g/dL (ref 1.5–4.5)
Glucose: 92 mg/dL (ref 70–99)
Potassium: 4.4 mmol/L (ref 3.5–5.2)
Sodium: 141 mmol/L (ref 134–144)
Total Protein: 6 g/dL (ref 6.0–8.5)
eGFR: 86 mL/min/{1.73_m2} (ref >59)

## 2022-07-20 MED ORDER — TRIAMCINOLONE ACETONIDE 10 MG/ML IJ SUSP
10.0000 mg | Freq: Once | INTRAMUSCULAR | Status: AC
  Administered 2022-07-20: 10 mg

## 2022-07-20 NOTE — Progress Notes (Signed)
Subjective:   Patient ID: Timothy Escobar, male   DOB: 81 y.o.   MRN: [id number]   HPI Patient states he is improved but still is having pain feels like it is in a different area than it was previously   ROS      Objective:  Physical Exam  Neurovascular status intact diminished discomfort in the right sinus tarsi into the right ankle gutter with patient now having pain more distal along the peroneal tendon near its insertion into the base of the fifth metatarsal     Assessment:  Inflammatory condition with peroneal tendinitis right that has occurred recently     Plan:  Explained condition and considerations for injection explaining risk and patient wants procedure and I went ahead did sterile prep and injected the peroneal near its insertion into the fifth metatarsal base 3 mg dexamethasone 5 mg Xylocaine advised on anti-inflammatories ice therapy reappoint as symptoms indicate

## 2022-07-24 ENCOUNTER — Ambulatory Visit: Payer: Medicare Other | Attending: Cardiovascular Disease

## 2022-07-24 DIAGNOSIS — I2102 ST elevation (STEMI) myocardial infarction involving left anterior descending coronary artery: Secondary | ICD-10-CM | POA: Diagnosis present

## 2022-07-24 DIAGNOSIS — I358 Other nonrheumatic aortic valve disorders: Secondary | ICD-10-CM | POA: Insufficient documentation

## 2022-07-24 DIAGNOSIS — I10 Essential (primary) hypertension: Secondary | ICD-10-CM | POA: Diagnosis present

## 2022-07-24 DIAGNOSIS — F32A Depression, unspecified: Secondary | ICD-10-CM | POA: Insufficient documentation

## 2022-07-24 DIAGNOSIS — E785 Hyperlipidemia, unspecified: Secondary | ICD-10-CM | POA: Diagnosis present

## 2022-07-24 LAB — ECHOCARDIOGRAM COMPLETE
Area-P 1/2: 3.12 cm2
P 1/2 time: 1473 msec
S' Lateral: 2.4 cm

## 2022-07-25 ENCOUNTER — Encounter: Payer: Self-pay | Admitting: Cardiovascular Disease

## 2022-07-25 ENCOUNTER — Ambulatory Visit: Payer: Medicare Other | Admitting: Cardiovascular Disease

## 2022-07-25 VITALS — BP 132/64 | HR 56 | Ht 68.5 in | Wt 164.6 lb

## 2022-07-25 DIAGNOSIS — F32A Depression, unspecified: Secondary | ICD-10-CM

## 2022-07-25 DIAGNOSIS — I2102 ST elevation (STEMI) myocardial infarction involving left anterior descending coronary artery: Secondary | ICD-10-CM | POA: Diagnosis not present

## 2022-07-25 DIAGNOSIS — I10 Essential (primary) hypertension: Secondary | ICD-10-CM

## 2022-07-25 DIAGNOSIS — E785 Hyperlipidemia, unspecified: Secondary | ICD-10-CM

## 2022-07-25 DIAGNOSIS — I358 Other nonrheumatic aortic valve disorders: Secondary | ICD-10-CM

## 2022-07-25 NOTE — Progress Notes (Signed)
Cardiology Office Note    Date:  07/25/2022   ID:  Matthan, Sledge 03/08/1941, MRN [id number]  PCP:  Shade Flood, MD  Cardiologist:  Nicki Guadalajara, MD   1 year follow-up evaluation   History of Present Illness:  KEYMARION BEARMAN is a 81 y.o. male who sufferred an Anterior STEMI on 08/22/16.  I last saw him in November 2020.  He presents for a 12 month follow-up evaluation.   Mr. Govind Furey was without cardiac history and developed acute onset of severe substernal chest pressure while he was moving logs in a wheelbarrow which commenced at approximately 5 PM on 08/22/16. EMS arrived within 15 minutes. His ECG in transient showed 11 mm of ST segment elevation in leads V3 through V6 consistent with an acute anterior injury current. A code STEMI was activated and he was brought emergently directly to the catheterization laboratory where I performed emergent catheterization. Catheterization revealed total occlusion of a large proximal LAD with TIMI 0 flow  and a large dominant normal left circumflex vessel and small nondominant RCA. He underwent successful PCI to the LAD, treated with PTCA/DES stenting at the site of 100% occlusion with initial insertion of a 3.016 mm Synergy DES stent.  Once  TIMI-3 flow was established it was evident that there was segmental disease beyond the stented segment of 70 and 80% and tandem stenting with a 2.7524 mm Synergy DES stent was done with post stent dilatation from 3.23 mm in the proximal portion of the proximal stent to 3.05 mm at the distal portion of the distal stent. LVEDP was 13 mm Hg and the door to balloon time from arrival was 19 minutes. DTB from chest pain onset prior to EMS arrival was < 1 hour.  He ultimately completely resolved his 11 mm ST elevation, ECG normalized, and subsequent echo showed completely normal LV fx with EF 60-65% without any wall motion abnormality! He was discharged 2 days later.  Since his event he has remained  asymptomatic.  He completed participation in cardiac rehabilitation.  He is participating in cardiac rehab.  When I last saw him, he was remaining completely asymptomatic.  Over the past 6 months.  He continues to be asymptomatic.  He is active.  He splits wood.  He continues to sing in the [facility] [employer] choir.  Laboratory on October 03, 2017 showed a total cholesterol 119, HDL 60, LDL 49, triglycerides 51.  Hemoglobin A1c was 5.5.  Creatine 0.88.  TSH 1.05.  Normal LFTs.   I saw him in July 2019 at which time he was continuing to be asymptomatic from a cardiovascular standpoint.  He remains active cutting his grass, cutting firewood, and walking.  He continues to sing in the [employer] choir.  H underwent  an echo Doppler study on Mar 05, 2018.  EF was 60 to 65% and he had normal diastolic parameters.  There was trivial AR, mild MR, trivial TR, and mild PR.    He underwent evaluation by Dr. Karyl Kinnier for right upper quadrant abdominal stabbing pain.  CT of his abdomen on July 19, 2018 revealed evidence for very mild fatty infiltration of the liver.  His pancreas was unremarkable.  He was found to have a small accessory spleen.  There was mild tension of the right renal collecting system of uncertain significance.  There was no stone or obstructing mass.  Aortic atherosclerosis was present.  He did not have any adenopathy.  He continues  to be very active.  He continued to sing for the [facility] choir.  Recent laboratory on October 28, 2018 continue to show excellent lipid studies with total cholesterol 125, HDL 65, LDL 49, and triglycerides 53.  He denied chest pain, PND, orthopnea or  Palpitations.  When I saw him in November 2020 he continued to do exceptionally well.   Unfortunately with the COVID-19 pandemic, he has been unable to perform.  However he is practicing singing daily.  He remained active on his property and denied any exertional shortness  of breath or chest pain.  His blood pressure at home typically runs in the 120s over 70s.  He was walking at least 2 miles 4 days/week.  He has not had recent laboratory.    I  saw him on July 01, 2020 and over the past year he had remained completely asymptomatic.  He continued  to be very active working on his property.  This past week he actually took down a small tree.  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  He denies chest pain PND orthopnea, palpitations, or any myalgias.  He just had lab work done several days ago prior to this office visit on June 24, 2020 which is excellent.  Creatinine 1.0.  Glucose 95.  LFTs normal.  Hemoglobin 14.1, Magick 42.4.  TSH normal 1.47.  Lipid studies excellent with total cholesterol 130, HDL 61, LDL 59 on atorvastatin 80 mg.  He continues to be on aspirin and low-dose Brilinta per [employer] trial data.  He is on low-dose lisinopril 2.5 mg.    Since I last saw him, he was evaluated by his primary physician Dr. Cindee Lame.  He has been on Prozac for depression which has been well controlled and his prescription was renewed.  I last saw him on August 19, 2021.  He continues to be stable from a cardiovascular standpoint and denied any chest pain or shortness of breath.  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  He has learned some additional baritone/base solo contados from [place] [phone]) and has accomplished other additional difficult pieces.  He remains active working in his yard.  He denies chest pain or palpitations.  He denies any exertional dyspnea.  He continues to be on lisinopril 2.5 mg daily, atorvastatin 80 mg, and DAPT with aspirin and low-dose Brilinta at 60 mg twice daily.  He continues to be on fluoxetine 20 mg daily.    Since I last saw him, Mr. Signorelli seems to be asymptomatic.   He has recently downsized his house and now is living in a smaller 1400 square foot house on a single level.  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  Practices every day.  He denies chest pain PND or.  He denies change in exercise tolerance.  He recently saw Dr. Cindee Lame in July 2023.  Laboratory on July 19, 2022 showed total cholesterol 117 HDL 60 LDL and triglycerides 47 on atorvastatin 80 mg.  He continues to be on aspirin and low-dose Brilinta 60 mg twice a day.  He is on amlodipine 5 mg hypertension and fluoxetine per Dr. Carlota Raspberry.  He underwent an echo Doppler study yesterday July 24, 2002 which showed an EF 60 to 65%,  mild LVH, and he had normal diastolic parameters.  There was mild MR and mild aortic sclerosis without stenosis with mild aortic regurgitation.  He presents for yearly evaluation.  Past Medical History:  Diagnosis Date   Blood transfusion without reported diagnosis    CAD (coronary artery disease)    a. 08/22/2016 Ant STEMI/PCI: LM nl, LAD 30ost, 100p (3.0x15 Synergy DES & 2.75x24 Synergy DES), LCX large/dominant/nl, RCA nondom, nl;  b. 08/2016 Echo: Ef 60-65%, no rwma.   Cataract    Phreesia 03/29/2020   Depression    Dyslipidemia    Heart murmur    Phreesia 03/29/2020   Myocardial infarction [facility])    Phreesia 03/29/2020   Skin cancer    skin cancer    Past Surgical History:  Procedure Laterality Date   APPENDECTOMY     CARDIAC CATHETERIZATION N/A 08/22/2016   Procedure: Left Heart Cath and Coronary Angiography;  Surgeon: Troy Sine, MD;  Location: [place] CV LAB;  Service: Cardiovascular;  Laterality: N/A;   CARDIAC CATHETERIZATION N/A 08/22/2016   Procedure: Coronary Stent Intervention;  Surgeon: Troy Sine, MD;  Location: [place] CV LAB;  Service: Cardiovascular;  Laterality: N/A;  MID LAD   COLON SURGERY N/A    Phreesia 03/29/2020   HERNIA REPAIR      parotid gland cyst removal Right 2012   vocal fold polyp removed      Current Medications: Outpatient Medications Prior to Visit  Medication Sig Dispense Refill   amLODipine (NORVASC) 5 MG tablet Take 0.5 tablet (2.5 mg) for 1 week, if systolic above 503 increase to 1 tablet (5 mg). 180 tablet 3   aspirin EC 81 MG EC tablet Take 1 tablet (81 mg total) by mouth daily. 90 tablet 2   atorvastatin (LIPITOR) 80 MG tablet TAKE 1 TABLET BY MOUTH ONCE DAILY 6 IN THE EVENING 90 tablet 0   calcium-vitamin D (OSCAL WITH D) 500-200 MG-UNIT per tablet Take 1 tablet by mouth daily. 1,000 of Vit D3     cholecalciferol (VITAMIN D) 1000 units tablet Take 1,000 Units by mouth daily.     FLUoxetine (PROZAC) 20 MG capsule Take 1 capsule (20 mg total) by mouth daily. 90 capsule 3   Ginger Root POWD by Does not apply route. Mixes 1/2 teaspoon of powder into hot tea once daily     Multiple Vitamins-Minerals (MULTIVITAMIN WITH MINERALS) tablet Take 1 tablet by mouth once a week. Centrum Silver     nitroGLYCERIN (NITROSTAT) 0.4 MG SL tablet Place 1 tablet (0.4 mg total) under the tongue every 5 (five) minutes x 3 doses as needed for chest pain. 25 tablet 3   ticagrelor (BRILINTA) 60 MG TABS tablet Take 1 tablet by mouth twice daily 180 tablet 3   vitamin C (ASCORBIC ACID) 500 MG tablet Take 500 mg by mouth daily.     No facility-administered medications prior to visit.     Allergies:   Patient has no known allergies.   Social History   Socioeconomic History   Marital status: Married    Spouse name: Not on file   Number of children: 2   Years of education: college   Highest education level: Not on file  Occupational History   Occupation: Retired - Water quality scientist  Tobacco Use   Smoking status: Former    Types: Cigarettes    Quit date: 01/22/1967    Years since quitting: 55.5   Smokeless tobacco: Never  Vaping Use   Vaping Use:  Never used  Substance and Sexual Activity   Alcohol use: Yes     Alcohol/week: 7.0 standard drinks of alcohol    Types: 7 Standard drinks or equivalent per week    Comment: wine: 1 glass daily   Drug use: Not on file   Sexual activity: Never  Other Topics Concern   Not on file  Social History Narrative   Lives w/ wife in [place]. Both are singers, they sing with the [employer] in [place] and with [employer], plus the church choir.   Exercise walk/gardening daily for 1 hr plus   Social Determinants of Health   Financial Resource Strain: Low Risk  (10/06/2021)   Overall Financial Resource Strain (CARDIA)    Difficulty of Paying Living Expenses: Not hard at all  Food Insecurity: No Food Insecurity (10/06/2021)   Hunger Vital Sign    Worried About Running Out of Food in the Last Year: Never true    Ran Out of Food in the Last Year: Never true  Transportation Needs: No Transportation Needs (10/06/2021)   PRAPARE - Hydrologist (Medical): No    Lack of Transportation (Non-Medical): No  Physical Activity: Sufficiently Active (10/06/2021)   Exercise Vital Sign    Days of Exercise per Week: 7 days    Minutes of Exercise per Session: 30 min  Stress: No Stress Concern Present (10/06/2021)   [place]    Feeling of Stress : Not at all  Social Connections: Moderately Integrated (10/06/2021)   Social Connection and Isolation Panel [NHANES]    Frequency of Communication with Friends and Family: Twice a week    Frequency of Social Gatherings with Friends and Family: Twice a week    Attends Religious Services: Never    Marine scientist or Organizations: Yes    Attends Music therapist: 1 to 4 times per year    Marital Status: Married     Family History:  The patient's family history includes Cancer in his brother, brother, father, paternal grandmother, sister, and sister; Dementia in his mother; Heart disease in his brother,  maternal grandmother, and sister; Hypertension in his sister; Stroke in his maternal grandmother.   ROS General: Negative; No fevers, chills, or night sweats;  HEENT: Negative; No changes in vision or hearing, sinus congestion, difficulty swallowing Pulmonary: Negative; No cough, wheezing, shortness of breath, hemoptysis Cardiovascular: Negative; No chest pain, presyncope, syncope, palpitations GI: resolved mild right upper quadrant tenderness. GU: Negative; No dysuria, hematuria, or difficulty voiding Musculoskeletal: Negative; no myalgias, joint pain, or weakness Hematologic/Oncology: Negative; no easy bruising, bleeding Endocrine: Negative; no heat/cold intolerance; no diabetes Neuro: Negative; no changes in balance, headaches Skin: Negative; No rashes or skin lesions Psychiatric: Negative; No behavioral problems, depression Sleep: Negative; No snoring, daytime sleepiness, hypersomnolence, bruxism, restless legs, hypnogognic hallucinations, no cataplexy Other comprehensive 14 point system review is negative.   PHYSICAL EXAM:   VS:  BP 132/64   Pulse (!) 56   Ht 5' 8.5" (1.74 m)   Wt 164 lb 9.6 oz (74.7 kg)   SpO2 97%   BMI 24.66 kg/m    Repeat blood pressure by me was 122/64  Wt Readings from Last 3 Encounters:  07/25/22 164 lb 9.6 oz (74.7 kg)  05/10/22 167 lb 12.8 oz (76.1 kg)  10/27/21 171 lb 12.8 oz (77.9 kg)     General: Alert, oriented, no distress.  Skin: normal turgor,  no rashes, warm and dry HEENT: Normocephalic, atraumatic. Pupils equal round and reactive to light; sclera anicteric; extraocular muscles intact;  Nose without nasal septal hypertrophy Mouth/Parynx benign; Mallinpatti scale 3 Neck: No JVD, no carotid bruits; normal carotid upstroke Lungs: clear to ausculatation and percussion; no wheezing or rales Chest wall: without tenderness to palpitation Heart: PMI not displaced, RRR, s1 s2 normal, 1/6 systolic murmur, no diastolic murmur, no rubs, gallops,  thrills, or heaves Abdomen: soft, nontender; no hepatosplenomehaly, BS+; abdominal aorta nontender and not dilated by palpation. Back: no CVA tenderness Pulses 2+ Musculoskeletal: full range of motion, normal strength, no joint deformities Extremities: no clubbing cyanosis or edema, Homan's sign negative  Neurologic: grossly nonfocal; Cranial nerves grossly wnl Psychologic: Normal mood and affect    Studies/Labs Reviewed:   August 19, 2021 ECG (independently read by me):  NSR at 60, No ST changes, PR 200 msec, no ectopy  July 01, 2020 ECG (independently read by me):   NSR at 62; No ectopy, normal intervals  November 2020 EKG:  EKG is ordered today. Sinus Bradycardia at 52; Mild early repolarization changes; PR 204 msec  November 04, 2018 ECG (independently read by me): Sinus bradycardia at 57 bpm.  Normal ECG.  No ectopy.  No ST segment changes.  PR interval 206 msec;QTc 412  July 2019 ECG (independently read by me): Sinius bradycardia 59 bpm.  No ST segment changes.  No ECG evidence for prior ACS.  December 2018 ECG (independently read by me): Normal sinus rhythm at 61 bpm.  No ST segment changes.  No ectopy.  Normal intervals.  June 2018 ECG (independently read by me): Sinus bradycardia 51 bpm.  No ST segment changes.  Normal intervals.  February 2018 ECG (independently read by me): Sinus bradycardia 53 bpm.  Normal ECG.  Early repolarization.  No evidence for prior infarction.  Recent Labs:    Latest Ref Rng & Units 07/19/2022    8:48 AM 10/27/2021   11:00 AM 08/25/2021   10:06 AM  BMP  Glucose 70 - 99 mg/dL 92  86  65   BUN 8 - 27 mg/dL $Remove'16  16  17   'VrQKREO$ Creatinine 0.76 - 1.27 mg/dL 0.91  0.86  0.90   BUN/Creat Ratio 10 - $Re'24 18   19   'Ahw$ Sodium 134 - 144 mmol/L 141  140  142   Potassium 3.5 - 5.2 mmol/L 4.4  4.0  3.8   Chloride 96 - 106 mmol/L 106  105  104   CO2 20 - 29 mmol/L $RemoveB'26  30  27   'SXSGPeYh$ Calcium 8.6 - 10.2 mg/dL 8.9  8.7  9.0         Latest Ref Rng & Units 07/19/2022     8:48 AM 08/16/2021    8:56 AM 06/24/2020    8:29 AM  Hepatic Function  Total Protein 6.0 - 8.5 g/dL 6.0  6.1  6.6   Albumin 3.8 - 4.8 g/dL 4.3  4.2  4.3   AST 0 - 40 IU/L $Remov'21  22  21   'aaZHHA$ ALT 0 - 44 IU/L $Remov'18  20  21   'AgPMGC$ Alk Phosphatase 44 - 121 IU/L 83  83  83   Total Bilirubin 0.0 - 1.2 mg/dL 0.8  0.8  0.7        Latest Ref Rng & Units 07/19/2022    8:48 AM 10/27/2021   11:00 AM 08/16/2021    8:56 AM  CBC  WBC 3.4 - 10.8 x10E3/uL 4.5  4.5  3.9   Hemoglobin 13.0 - 17.7 g/dL 14.2  14.4  14.2   Hematocrit 37.5 - 51.0 % 41.6  43.2  41.8   Platelets 150 - 450 x10E3/uL 157  158.0  189    Lab Results  Component Value Date   MCV 97 07/19/2022   MCV 95.6 10/27/2021   MCV 95 08/16/2021   Lab Results  Component Value Date   TSH 1.040 07/19/2022   Lab Results  Component Value Date   HGBA1C 5.5 08/23/2016     BNP    Component Value Date/Time   BNP 164.0 (H) 08/23/2016 0200    ProBNP No results found for: "PROBNP"   Lipid Panel     Component Value Date/Time   CHOL 117 07/19/2022 0848   TRIG 47 07/19/2022 0848   HDL 60 07/19/2022 0848   CHOLHDL 2.0 07/19/2022 0848   CHOLHDL 2.3 10/10/2016 0951   VLDL 10 10/10/2016 0951   LDLCALC 46 07/19/2022 0848     RADIOLOGY: ECHOCARDIOGRAM COMPLETE  Result Date: 07/24/2022    ECHOCARDIOGRAM REPORT   Patient Name:   BECK COFER [facility] Date of Exam: 07/24/2022 Medical Rec #:  [id number]        Height:       68.0 in Accession #:    [id number]       Weight:       167.8 lb Date of Birth:  July 20, 1941        BSA:          1.897 m Patient Age:    30 years         BP:           153/82 mmHg Patient Gender: M                HR:           50 bpm. Exam Location:  [facility] Procedure: 2D Echo, 3D Echo, Cardiac Doppler and Color Doppler Indications:    I21.02 STEMI  History:        Patient has prior history of Echocardiogram examinations, most                 recent 03/05/2018. CAD, Arrythmias:Bradycardia; Risk                 Factors:Hypertension.   Sonographer:    Marygrace Drought RCS Referring Phys: [place]  1. Left ventricular ejection fraction, by estimation, is 60 to 65%. The left ventricle has normal function. The left ventricle has no regional wall motion abnormalities. There is mild left ventricular hypertrophy. Left ventricular diastolic parameters were normal.  2. Right ventricular systolic function is normal. The right ventricular size is normal. There is normal pulmonary artery systolic pressure.  3. Left atrial size was mildly dilated.  4. The mitral valve is abnormal. Mild mitral valve regurgitation. No evidence of mitral stenosis.  5. The aortic valve is tricuspid. There is mild calcification of the aortic valve. There is mild thickening of the aortic valve. Aortic valve regurgitation is mild. Aortic valve sclerosis is present, with no evidence of aortic valve stenosis.  6. The inferior vena cava is normal in size with greater than 50% respiratory variability, suggesting right atrial pressure of 3 mmHg. FINDINGS  Left Ventricle: Left ventricular ejection fraction, by estimation, is 60 to 65%. The left ventricle has normal function. The left ventricle has no regional wall motion abnormalities. The left ventricular internal cavity size was normal in  size. There is  mild left ventricular hypertrophy. Left ventricular diastolic parameters were normal. Right Ventricle: The right ventricular size is normal. No increase in right ventricular wall thickness. Right ventricular systolic function is normal. There is normal pulmonary artery systolic pressure. The tricuspid regurgitant velocity is 2.58 m/s, and  with an assumed right atrial pressure of 3 mmHg, the estimated right ventricular systolic pressure is 32.2 mmHg. Left Atrium: Left atrial size was mildly dilated. Right Atrium: Right atrial size was normal in size. Pericardium: There is no evidence of pericardial effusion. Mitral Valve: The mitral valve is abnormal. There is mild  thickening of the mitral valve leaflet(s). Mild mitral valve regurgitation. No evidence of mitral valve stenosis. Tricuspid Valve: The tricuspid valve is normal in structure. Tricuspid valve regurgitation is mild . No evidence of tricuspid stenosis. Aortic Valve: The aortic valve is tricuspid. There is mild calcification of the aortic valve. There is mild thickening of the aortic valve. Aortic valve regurgitation is mild. Aortic regurgitation PHT measures 1473 msec. Aortic valve sclerosis is present, with no evidence of aortic valve stenosis. Pulmonic Valve: The pulmonic valve was normal in structure. Pulmonic valve regurgitation is mild. No evidence of pulmonic stenosis. Aorta: The aortic root is normal in size and structure. Venous: The inferior vena cava is normal in size with greater than 50% respiratory variability, suggesting right atrial pressure of 3 mmHg. IAS/Shunts: No atrial level shunt detected by color flow Doppler.  LEFT VENTRICLE PLAX 2D LVIDd:         3.80 cm   Diastology LVIDs:         2.40 cm   LV e' medial:    7.07 cm/s LV PW:         0.90 cm   LV E/e' medial:  11.6 LV IVS:        1.20 cm   LV e' lateral:   10.80 cm/s LVOT diam:     2.00 cm   LV E/e' lateral: 7.6 LV SV:         90 LV SV Index:   48 LVOT Area:     3.14 cm                           3D Volume EF:                          3D EF:        66 %                          LV EDV:       158 ml                          LV ESV:       53 ml                          LV SV:        105 ml RIGHT VENTRICLE RV Basal diam:  3.80 cm RV S prime:     13.90 cm/s TAPSE (M-mode): 2.7 cm RVSP:           29.6 mmHg LEFT ATRIUM             Index        RIGHT ATRIUM  Index LA diam:        3.90 cm 2.06 cm/m   RA Pressure: 3.00 mmHg LA Vol (A2C):   46.8 ml 24.67 ml/m  RA Area:     18.60 cm LA Vol (A4C):   61.0 ml 32.16 ml/m  RA Volume:   45.80 ml  24.15 ml/m LA Biplane Vol: 56.1 ml 29.58 ml/m  AORTIC VALVE LVOT Vmax:   110.00 cm/s LVOT Vmean:   85.100 cm/s LVOT VTI:    0.288 m AI PHT:      1473 msec  AORTA Ao Root diam: 3.30 cm Ao Asc diam:  3.50 cm MITRAL VALVE               TRICUSPID VALVE MV Area (PHT):             TR Peak grad:   26.6 mmHg MV Decel Time:             TR Vmax:        258.00 cm/s MV E velocity: 82.30 cm/s  Estimated RAP:  3.00 mmHg MV A velocity: 73.90 cm/s  RVSP:           29.6 mmHg MV E/A ratio:  1.11                            SHUNTS                            Systemic VTI:  0.29 m                            Systemic Diam: 2.00 cm Jenkins Rouge MD Electronically signed by Jenkins Rouge MD Signature Date/Time: 07/24/2022/9:24:50 AM    Final      ASSESSMENT:    1. Essential hypertension   2. Acute ST elevation myocardial infarction (STEMI) involving left anterior descending (LAD) coronary artery [facility]): August 22, 2016 with DES stent to LAD   3. Hyperlipidemia with target LDL less than 70   4. Aortic valve sclerosis   5. Mild depression      PLAN:  Mr Boliver is a very pleasant 63 -year-old Caucasian male who develeoped an ACS/Anterior STEMI with 11 mm ST elevation precordially on Halloween 2017. Fortunately he acted quickly and was prompty brought to [facility] cath lab where I performed emergent cath/PCI .  He had an DTB time of 19 minutes from arrival to the lab and his chest pain duration was less than 60 minutes. Due to disease beyond the initial occlusion he underwent tandem DES stenting with an excellent result.  He has been demonstrated to have complete myocardial salvage and his ECG is normal.  His last echo Doppler study in May 2019 continued to show an EF of 60 to 65% without wall motion abnormalities.  His aortic valve is moderately thickened and moderately calcified but excursion was normal.  He had trivial AR, mild MR, trivial TR and mild PR.  There was increased thickness of the interatrial septum consistent with lipomatous hypertrophy.  There was mild/moderate LA dilation.  His medical case was interviewed on [employer]  on his birthday December 09, 2016.  Presently, Mr. Teressa Senter continues to do exceptionally well.  He is completely asymptomatic.  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  He has continued to be on low-dose aspirin and  the Brilinta 60 mg twice a day.  His blood pressure today is well controlled and on repeat by me was 122/64 on amlodipine 5 mg.  He continues to be on atorvastatin 80 mg.  Recent lipid panel significantly improved from July 19, 2022 showing a cholesterol of 117 and LDL cholesterol now at 46 HDL 60 and triglycerides 47. He is on fluoxetine prescribed by Dr. Cindee Lame for some depression.  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  I reviewed his most recent echo Doppler study yesterday to 2003 which continues to show normal LV systolic and diastolic function, mild aortic sclerosis without stenosis, mild MR and mild AR.  He will continue current therapy.  I will see him in 1 year for reevaluation or sooner as needed.   Medication Adjustments/Labs and Tests Ordered: Current medicines are reviewed at length with the patient today.  Concerns regarding medicines are outlined above.  Medication changes, Labs and Tests ordered today are listed in the Patient Instructions below.  Patient Instructions  Medication Instructions:  The current medical regimen is effective;  continue present plan and medications.  *If you need a refill on your cardiac medications before your next appointment, please call your pharmacy*   Follow-Up: At [facility], you and your health needs are our priority.  As part of our continuing mission to provide you with exceptional heart care, we have created designated Provider Care Teams.  These Care Teams include your primary Cardiologist (physician) and Advanced Practice Providers (APPs -  Physician Assistants and Nurse Practitioners) who all work together to provide you with the care you need, when you  need it.  We recommend signing up for the patient portal called "MyChart".  Sign up information is provided on this After Visit Summary.  MyChart is used to connect with patients for Virtual Visits (Telemedicine).  Patients are able to view lab/test results, encounter notes, upcoming appointments, etc.  Non-urgent messages can be sent to your provider as well.   To learn more about what you can do with MyChart, go to [email or web address].    Your next appointment:   12 month(s)  The format for your next appointment:   In Person  Provider:   Shelva Majestic, MD             Signed, Shelva Majestic, MD , [facility] 07/25/2022 5:29 PM    [facility] [address], [place], [place], [place]  [address] Phone: [phone]

## 2022-07-25 NOTE — Patient Instructions (Signed)

## 2022-09-04 ENCOUNTER — Ambulatory Visit (INDEPENDENT_AMBULATORY_CARE_PROVIDER_SITE_OTHER): Payer: Medicare Other | Admitting: Family Medicine

## 2022-09-04 ENCOUNTER — Encounter: Payer: Self-pay | Admitting: Family Medicine

## 2022-09-04 VITALS — BP 118/74 | HR 49 | Temp 98.6°F | Ht 68.5 in | Wt 165.8 lb

## 2022-09-04 DIAGNOSIS — N485 Ulcer of penis: Secondary | ICD-10-CM | POA: Diagnosis not present

## 2022-09-04 NOTE — Progress Notes (Signed)
Subjective:  Patient ID: Timothy Escobar, male    DOB: 1941/03/21  Age: 81 y.o. MRN: [id number]  CC:  Chief Complaint  Patient presents with   sore    Pt states he has a sore on the back side of his penis    Depression    PHQ9 - 4     HPI Timothy Escobar presents for   Penile lesion Lesion under head of penis at frenulum, seen by PCP about 20 yrs ago. Small crusted area at that time periodically, treated with vaseline or abx ointment.  Only intermittent need for vaseline/abx every few months, improves within a few days typically.  Less sexually active prior, more active recently with souse, no extramarital sexual activity. No lubricants needed during intercourse. No vesicles.  Past few months area has been more present. Usually clears with triple abx ointment. Area tries to heal, then reopening at times.  Not interfering with urination or intercourse. Minimal blood when opens up, no discharge. No pain typically - min irritation after opens.   Ongoing care with dermatology with hx of skin CA. No urologist.   No personal hx of HSV and not in spouse either.   Positive depression screening, history of known depression treated with fluoxetine stable with PHQ 4.    09/04/2022   11:04 AM 05/10/2022    1:40 PM 10/27/2021    9:58 AM 10/13/2021    2:40 PM 10/06/2021    1:42 PM  Depression screen PHQ 2/9  Decreased Interest 0 0 0 0 0  Down, Depressed, Hopeless 1 1 0 0 0  PHQ - 2 Score 1 1 0 0 0  Altered sleeping 1 1  0   Tired, decreased energy 1 0  0   Change in appetite 0 0  0   Feeling bad or failure about yourself  1 0  0   Trouble concentrating 0 0  0   Moving slowly or fidgety/restless 0 0  0   Suicidal thoughts 0 0  0   PHQ-9 Score 4 2  0   Difficult doing work/chores    Not difficult at all     History Patient Active Problem List   Diagnosis Date Noted   Sinus bradycardia 01/23/2017   Dyslipidemia    CAD (coronary artery disease)    Status post coronary artery  stent placement    Bradycardia    Hypokalemia    Hyperlipidemia with target LDL less than 70    STEMI (ST elevation myocardial infarction) ([place]) 08/22/2016   Acute ST elevation myocardial infarction (STEMI) involving left anterior descending (LAD) coronary artery ([place]) 08/22/2016   Hip pain 09/28/2012   Chronic depression 09/28/2012   Past Medical History:  Diagnosis Date   Blood transfusion without reported diagnosis    CAD (coronary artery disease)    a. 08/22/2016 Ant STEMI/PCI: LM nl, LAD 30ost, 100p (3.0x15 Synergy DES & 2.75x24 Synergy DES), LCX large/dominant/nl, RCA nondom, nl;  b. 08/2016 Echo: Ef 60-65%, no rwma.   Cataract    Phreesia 03/29/2020   Depression    Dyslipidemia    Heart murmur    Phreesia 03/29/2020   Myocardial infarction [facility])    Phreesia 03/29/2020   Skin cancer    skin cancer   Past Surgical History:  Procedure Laterality Date   APPENDECTOMY     CARDIAC CATHETERIZATION N/A 08/22/2016   Procedure: Left Heart Cath and Coronary Angiography;  Surgeon: Troy Sine, MD;  Location: [place]  CV LAB;  Service: Cardiovascular;  Laterality: N/A;   CARDIAC CATHETERIZATION N/A 08/22/2016   Procedure: Coronary Stent Intervention;  Surgeon: Troy Sine, MD;  Location: [place] CV LAB;  Service: Cardiovascular;  Laterality: N/A;  MID LAD   COLON SURGERY N/A    Phreesia 03/29/2020   HERNIA REPAIR     parotid gland cyst removal Right 2012   vocal fold polyp removed     No Known Allergies Prior to Admission medications   Medication Sig Start Date End Date Taking? Authorizing Provider  amLODipine (NORVASC) 5 MG tablet Take 0.5 tablet (2.5 mg) for 1 week, if systolic above 756 increase to 1 tablet (5 mg). 10/06/21  Yes Troy Sine, MD  aspirin EC 81 MG EC tablet Take 1 tablet (81 mg total) by mouth daily. 08/26/16  Yes Maryellen Pile, MD  atorvastatin (LIPITOR) 80 MG tablet TAKE 1 TABLET BY MOUTH ONCE DAILY 6 IN THE EVENING 06/19/22  Yes Troy Sine, MD  calcium-vitamin D (OSCAL WITH D) 500-200 MG-UNIT per tablet Take 1 tablet by mouth daily. 1,000 of Vit D3   Yes [provider]  cholecalciferol (VITAMIN D) 1000 units tablet Take 1,000 Units by mouth daily.   Yes [provider]  FLUoxetine (PROZAC) 20 MG capsule Take 1 capsule (20 mg total) by mouth daily. 05/10/22  Yes Wendie Agreste, MD  Ginger Root POWD by Does not apply route. Mixes 1/2 teaspoon of powder into hot tea once daily   Yes [provider]  Multiple Vitamins-Minerals (MULTIVITAMIN WITH MINERALS) tablet Take 1 tablet by mouth once a week. Centrum Silver   Yes [provider]  nitroGLYCERIN (NITROSTAT) 0.4 MG SL tablet Place 1 tablet (0.4 mg total) under the tongue every 5 (five) minutes x 3 doses as needed for chest pain. 03/03/20  Yes Troy Sine, MD  ticagrelor [facility]) 60 MG TABS tablet Take 1 tablet by mouth twice daily 02/06/22  Yes Troy Sine, MD  vitamin C (ASCORBIC ACID) 500 MG tablet Take 500 mg by mouth daily.   Yes [provider]   Social History   Socioeconomic History   Marital status: Married    Spouse name: Not on file   Number of children: 2   Years of education: college   Highest education level: Not on file  Occupational History   Occupation: Retired - Water quality scientist  Tobacco Use   Smoking status: Former    Types: Cigarettes    Quit date: 01/22/1967    Years since quitting: 55.6   Smokeless tobacco: Never  Vaping Use   Vaping Use: Never used  Substance and Sexual Activity   Alcohol use: Yes    Alcohol/week: 7.0 standard drinks of alcohol    Types: 7 Standard drinks or equivalent per week    Comment: wine: 1 glass daily   Drug use: Not on file   Sexual activity: Never  Other Topics Concern   Not on file  Social History Narrative   Lives w/ wife in [place]. Both are singers, they sing with the [employer] in [place] and with [employer], plus the church choir.   Exercise  walk/gardening daily for 1 hr plus   Social Determinants of Health   Financial Resource Strain: Low Risk  (10/06/2021)   Overall Financial Resource Strain (CARDIA)    Difficulty of Paying Living Expenses: Not hard at all  Food Insecurity: No Food Insecurity (10/06/2021)   Hunger Vital Sign  Worried About Charity fundraiser in the Last Year: Never true    [place] in the Last Year: Never true  Transportation Needs: No Transportation Needs (10/06/2021)   PRAPARE - Hydrologist (Medical): No    Lack of Transportation (Non-Medical): No  Physical Activity: Sufficiently Active (10/06/2021)   Exercise Vital Sign    Days of Exercise per Week: 7 days    Minutes of Exercise per Session: 30 min  Stress: No Stress Concern Present (10/06/2021)   [place]    Feeling of Stress : Not at all  Social Connections: Moderately Integrated (10/06/2021)   Social Connection and Isolation Panel [NHANES]    Frequency of Communication with Friends and Family: Twice a week    Frequency of Social Gatherings with Friends and Family: Twice a week    Attends Religious Services: Never    Marine scientist or Organizations: Yes    Attends Archivist Meetings: 1 to 4 times per year    Marital Status: Married  Human resources officer Violence: Not At Risk (10/06/2021)   Humiliation, Afraid, Rape, and Kick questionnaire    Fear of Current or Ex-Partner: No    Emotionally Abused: No    Physically Abused: No    Sexually Abused: No   Review of Systems Per HPI.   Objective:   Vitals:   09/04/22 1107  BP: 118/74  Pulse: (!) 49  Temp: 98.6 F (37 C)  SpO2: 97%  Weight: 165 lb 12.8 oz (75.2 kg)  Height: 5' 8.5" (1.74 m)     Physical Exam Constitutional:      General: He is not in acute distress.    Appearance: Normal appearance. He is well-developed.  HENT:     Head: Normocephalic and  atraumatic.  Cardiovascular:     Rate and Rhythm: Normal rate.  Pulmonary:     Effort: Pulmonary effort is normal.  Genitourinary:    Penis: Circumcised. Lesions (shallow alcer appearing area R lateral to frenulum. no vesicles, no surrounding erythema or d/c.) present. No discharge.      Testes: Normal.        Right: Mass, tenderness or swelling not present.        Left: Mass, tenderness or swelling not present.     Epididymis:     Right: Normal.     Left: Normal.  Lymphadenopathy:     Lower Body: No right inguinal adenopathy. No left inguinal adenopathy.  Neurological:     Mental Status: He is alert and oriented to person, place, and time.  Psychiatric:        Mood and Affect: Mood normal.     Assessment & Plan:  Timothy Escobar is a 81 y.o. male . Penile ulcer - Plan: RPR, HSV(herpes simplex vrs) 1+2 ab-IgG, Ambulatory referral to Urology  -Recurrent irritation, faint ulceration of the penile shaft, just proximal to the glans, lateral to the frenulum.  Longstanding issue, will check HSV, RPR but less likely.  Referred to urology for further evaluation.  Okay to apply Polysporin or Vaseline to area for now, watch for secondary infection, not seen currently.  No orders of the defined types were placed in this encounter.  Patient Instructions  I will refer you to urology, and if concerns on labs I will let you know.  Topical polysporin or vaseline is fine if needed temporarily.   Return to the clinic or  go to the nearest emergency room if any of your symptoms worsen or new symptoms occur.     Signed,   Merri Ray, MD [place], [facility] 09/04/22 11:59 AM

## 2022-09-04 NOTE — Patient Instructions (Signed)
I will refer you to urology, and if concerns on labs I will let you know.  Topical polysporin or vaseline is fine if needed temporarily.   Return to the clinic or go to the nearest emergency room if any of your symptoms worsen or new symptoms occur.

## 2022-09-05 LAB — HSV(HERPES SIMPLEX VRS) I + II AB-IGG
HAV 1 IGG,TYPE SPECIFIC AB: <0.9 index
HSV 2 IGG,TYPE SPECIFIC AB: <0.9 index

## 2022-09-05 LAB — RPR: RPR Ser Ql: NONREACTIVE

## 2022-09-07 ENCOUNTER — Other Ambulatory Visit: Payer: Self-pay | Admitting: Cardiovascular Disease

## 2022-11-10 ENCOUNTER — Encounter: Payer: Self-pay | Admitting: Family Medicine

## 2022-11-10 ENCOUNTER — Ambulatory Visit (INDEPENDENT_AMBULATORY_CARE_PROVIDER_SITE_OTHER): Payer: Medicare Other | Admitting: Family Medicine

## 2022-11-10 VITALS — BP 132/70 | HR 58 | Temp 97.6°F | Ht 68.0 in | Wt 163.4 lb

## 2022-11-10 DIAGNOSIS — F32A Depression, unspecified: Secondary | ICD-10-CM

## 2022-11-10 DIAGNOSIS — L309 Dermatitis, unspecified: Secondary | ICD-10-CM | POA: Diagnosis not present

## 2022-11-10 DIAGNOSIS — I25118 Atherosclerotic heart disease of native coronary artery with other forms of angina pectoris: Secondary | ICD-10-CM

## 2022-11-10 DIAGNOSIS — E785 Hyperlipidemia, unspecified: Secondary | ICD-10-CM | POA: Diagnosis not present

## 2022-11-10 MED ORDER — BETAMETHASONE VALERATE 0.1 % EX OINT: 1.0000 | TOPICAL_OINTMENT | Freq: Two times a day (BID) | CUTANEOUS | 0 refills | Status: DC

## 2022-11-10 NOTE — Patient Instructions (Addendum)
Try stopping fluoxetine - ok to take every other day for a week then stop that is ok, but usually do not need much of a taper with fluoxetine as it is long acting. If new symptoms, let me know or restart it.   Rash on hand could be a form of eczema.  Okay to use Eucerin or Cetaphil lotion over-the-counter, Vaseline if needed for very dry areas.  Steroid cream prescribed is stronger than over-the-counter cortisone.  That can be used up to twice per day, but use it only as needed and for short term.  Keep follow-up with dermatologist in March as planned.  Follow-up if that area worsens or has any discharge or surrounding redness.   No other med changes at this time.

## 2022-11-10 NOTE — Progress Notes (Signed)
Subjective:  Patient ID: Timothy Escobar, male    DOB: [birth year]  Age: 82 y.o. MRN: [id number]  CC:  Chief Complaint  Patient presents with   Medication Management    Pt notes he is going to see Derm soon for skin condition just wanted you to see it and be aware but is seeing derm for treatment  No concerns pt notes he is fasting     HPI Timothy Escobar presents for  Cardiac Followed by Dr. Claiborne Billings with cardiology, last appt 07/25/2022, history of STEMI 2017with DES, aortic atherosclerosis, hypertension treated with amlodipine .  Dual antiplatelet therapy with Brilinta and Aspirin continued. No new bleeding. Some occasional bruising with bumps, no new sx's.   Home BP 128-132/70. No med side effects.   BP Readings from Last 3 Encounters:  11/10/22 132/70  09/04/22 118/74  07/25/22 132/64   R hand rash  Dry, thickened, itchy skin on R thumb, some areas on fingers. Has discussed with dermatology in the past - other med recommended in past - did not use. More notable past month. Appt with derm in March.  Tx: Vaseline several times per day.  rare cortisone - helps itch.   Hyperlipidemia: Lipitor 80 mg daily, on high-dose statin with history of STEMI.  Lab Results  Component Value Date   CHOL 117 07/19/2022   HDL 60 07/19/2022   LDLCALC 46 07/19/2022   TRIG 47 07/19/2022   CHOLHDL 2.0 07/19/2022   Lab Results  Component Value Date   ALT 18 07/19/2022   AST 21 07/19/2022   ALKPHOS 83 07/19/2022   BILITOT 0.8 07/19/2022   Depression: Treated with fluoxetine 20 mg daily.  Situational stressors discussed back in July of last year, management techniques have been discussed. Would like to try coming off fluoxetine. Feels like he is in a good place.  Some possible sexual side effects - would like to try off SSRI.      11/10/2022    9:17 AM 09/04/2022   11:04 AM 05/10/2022    1:40 PM 10/27/2021    9:58 AM 10/13/2021    2:40 PM  Depression screen PHQ 2/9  Decreased Interest 0  0 0 0 0  Down, Depressed, Hopeless 0 1 1 0 0  PHQ - 2 Score 0 1 1 0 0  Altered sleeping 0 1 1  0  Tired, decreased energy 0 1 0  0  Change in appetite 0 0 0  0  Feeling bad or failure about yourself  0 1 0  0  Trouble concentrating 0 0 0  0  Moving slowly or fidgety/restless 0 0 0  0  Suicidal thoughts 0 0 0  0  PHQ-9 Score 0 4 2  0  Difficult doing work/chores     Not difficult at all   Penile ulcer/lesion: Discussed in November.  Treated with Polysporin or Vaseline at that time as barrier, and referred to urology.  HSV IgG was negative, RPR was nonreactive. Urology note reviewed from December 18, chronic penile lesion, frenular skin bridge.  Did not appear characteristic of penile cancer and no risk factors.  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  Options of care with as needed topical versus outpatient surgery to remove bridge discussed.  Plan for continued symptomatic care with option to remove if more bothersome.  History Patient Active Problem List   Diagnosis Date Noted   Sinus bradycardia 01/23/2017   Dyslipidemia    CAD (coronary artery  disease)    Status post coronary artery stent placement    Bradycardia    Hypokalemia    Hyperlipidemia with target LDL less than 70    STEMI (ST elevation myocardial infarction) ([place]) 08/22/2016   Acute ST elevation myocardial infarction (STEMI) involving left anterior descending (LAD) coronary artery ([place]) 08/22/2016   Hip pain 09/28/2012   Chronic depression 09/28/2012   Past Medical History:  Diagnosis Date   Blood transfusion without reported diagnosis    CAD (coronary artery disease)    a. 08/22/2016 Ant STEMI/PCI: LM nl, LAD 30ost, 100p (3.0x15 Synergy DES & 2.75x24 Synergy DES), LCX large/dominant/nl, RCA nondom, nl;  b. 08/2016 Echo: Ef 60-65%, no rwma.   Cataract    Phreesia 03/29/2020   Depression    Dyslipidemia    Heart murmur    Phreesia 03/29/2020   Myocardial infarction [facility]    Phreesia  03/29/2020   Skin cancer    skin cancer   Past Surgical History:  Procedure Laterality Date   APPENDECTOMY     CARDIAC CATHETERIZATION N/A 08/22/2016   Procedure: Left Heart Cath and Coronary Angiography;  Surgeon: Troy Sine, MD;  Location: [place] CV LAB;  Service: Cardiovascular;  Laterality: N/A;   CARDIAC CATHETERIZATION N/A 08/22/2016   Procedure: Coronary Stent Intervention;  Surgeon: Troy Sine, MD;  Location: [place] [facility] LAB;  Service: Cardiovascular;  Laterality: N/A;  MID LAD   COLON SURGERY N/A    Phreesia 03/29/2020   HERNIA REPAIR     parotid gland cyst removal Right 2012   vocal fold polyp removed     No Known Allergies Prior to Admission medications   Medication Sig Start Date End Date Taking? Authorizing Provider  amLODipine (NORVASC) 5 MG tablet Take 0.5 tablet (2.5 mg) for 1 week, if systolic above 932 increase to 1 tablet (5 mg). 10/06/21  Yes Troy Sine, MD  aspirin EC 81 MG EC tablet Take 1 tablet (81 mg total) by mouth daily. 08/26/16  Yes Maryellen Pile, MD  atorvastatin (LIPITOR) 80 MG tablet TAKE 1 TABLET BY MOUTH IN THE EVENING AT  6PM 09/08/22  Yes Troy Sine, MD  calcium-vitamin D (OSCAL WITH D) 500-200 MG-UNIT per tablet Take 1 tablet by mouth daily. 1,000 of Vit D3   Yes [provider]  cholecalciferol (VITAMIN D) 1000 units tablet Take 1,000 Units by mouth daily.   Yes [provider]  FLUoxetine (PROZAC) 20 MG capsule Take 1 capsule (20 mg total) by mouth daily. 05/10/22  Yes Wendie Agreste, MD  Ginger Root POWD by Does not apply route. Mixes 1/2 teaspoon of powder into hot tea once daily   Yes [provider]  Multiple Vitamins-Minerals (MULTIVITAMIN WITH MINERALS) tablet Take 1 tablet by mouth once a week. Centrum Silver   Yes [provider]  nitroGLYCERIN (NITROSTAT) 0.4 MG SL tablet Place 1 tablet (0.4 mg total) under the tongue every 5 (five) minutes x 3 doses as needed for chest pain.  03/03/20  Yes Troy Sine, MD  ticagrelor [facility]) 60 MG TABS tablet Take 1 tablet by mouth twice daily 02/06/22  Yes Troy Sine, MD  vitamin C (ASCORBIC ACID) 500 MG tablet Take 500 mg by mouth daily.   Yes [provider]   Social History   Socioeconomic History   Marital status: Married    Spouse name: Not on file   Number of children: 2   Years of education: college  Highest education level: Not on file  Occupational History   Occupation: Retired - Water quality scientist  Tobacco Use   Smoking status: Former    Types: Cigarettes    Quit date: 01/22/1967    Years since quitting: 55.8   Smokeless tobacco: Never  Vaping Use   Vaping Use: Never used  Substance and Sexual Activity   Alcohol use: Yes    Alcohol/week: 14.0 standard drinks of alcohol    Types: 14 Standard drinks or equivalent per week    Comment: wine: 2 glass daily   Drug use: Never   Sexual activity: Never  Other Topics Concern   Not on file  Social History Narrative   Lives w/ wife in [place]. Both are singers, they sing with the [employer] in [place] and with [employer], plus the church choir.   Exercise walk/gardening daily for 1 hr plus   Social Determinants of Health   Financial Resource Strain: Low Risk  (10/06/2021)   Overall Financial Resource Strain (CARDIA)    Difficulty of Paying Living Expenses: Not hard at all  Food Insecurity: No Food Insecurity (10/06/2021)   Hunger Vital Sign    Worried About Running Out of Food in the Last Year: Never true    Ran Out of Food in the Last Year: Never true  Transportation Needs: No Transportation Needs (10/06/2021)   PRAPARE - Hydrologist (Medical): No    Lack of Transportation (Non-Medical): No  Physical Activity: Sufficiently Active (10/06/2021)   Exercise Vital Sign    Days of Exercise per Week: 7 days    Minutes of Exercise per Session: 30 min  Stress: No Stress Concern Present (10/06/2021)    [employer]    Feeling of Stress : Not at all  Social Connections: Moderately Integrated (10/06/2021)   Social Connection and Isolation Panel [NHANES]    Frequency of Communication with Friends and Family: Twice a week    Frequency of Social Gatherings with Friends and Family: Twice a week    Attends Religious Services: Never    Marine scientist or Organizations: Yes    Attends Archivist Meetings: 1 to 4 times per year    Marital Status: Married  Human resources officer Violence: Not At Risk (10/06/2021)   Humiliation, Afraid, Rape, and Kick questionnaire    Fear of Current or Ex-Partner: No    Emotionally Abused: No    Physically Abused: No    Sexually Abused: No    Review of Systems Per hpi.   Objective:   Vitals:   11/10/22 0917  BP: 132/70  Pulse: (!) 58  Temp: 97.6 F (36.4 C)  TempSrc: Temporal  SpO2: 97%  Weight: 163 lb 6.4 oz (74.1 kg)  Height: '5\' 8"'$  (1.727 m)     Physical Exam Vitals reviewed.  Constitutional:      Appearance: He is well-developed.  HENT:     Head: Normocephalic and atraumatic.  Neck:     Vascular: No carotid bruit or JVD.  Cardiovascular:     Rate and Rhythm: Normal rate and regular rhythm.     Heart sounds: Normal heart sounds. No murmur heard. Pulmonary:     Effort: Pulmonary effort is normal.     Breath sounds: Normal breath sounds. No rales.  Musculoskeletal:     Right lower leg: No edema.     Left lower leg: No edema.  Skin:  General: Skin is warm and dry.  Neurological:     Mental Status: He is alert and oriented to person, place, and time.  Psychiatric:        Mood and Affect: Mood normal.   R hand rash: Immunization History  Administered Date(s) Administered   Fluad Quad(high Dose 65+) 07/25/2019, 07/21/2020, 08/09/2022   Influenza,inj,Quad PF,6+ Mos 07/18/2013, 07/29/2014, 08/30/2015, 07/20/2016, 08/01/2017, 08/21/2018    Influenza-Unspecified 07/23/2012, 07/23/2021   PFIZER(Purple Top)SARS-COV-2 Vaccination 11/12/2019, 11/30/2019, 07/20/2020, 02/02/2021   Pneumococcal Conjugate-13 12/18/2017   Pneumococcal Polysaccharide-23 05/13/2008   Td 05/13/2008   Tdap 09/26/2012   Zoster, Live 10/23/2009      Assessment & Plan:  Timothy Escobar is a 82 y.o. male . Hyperlipidemia with target LDL less than 70 - Plan: CANCELED: Comprehensive metabolic panel, CANCELED: Lipid panel  -Stable, continue high-dose statin with history of STEMI/heart disease.   keep follow-up with cardiology.  Hand eczema - Plan: betamethasone valerate ointment (VALISONE) 0.1 %  -Trial of betamethasone to what appears to be eczematous areas, hydrating lotion discussed, keep follow-up with dermatology with RTC precautions if worsening.  Depression, unspecified depression type  -Well-controlled, will try tapering off fluoxetine.  We did discuss long unlikely to withdrawal but he will taper for the next week meds.  If return of depressive symptoms, restart daily.  This should also help with erectile dysfunction/sexual side effects but RTC precautions were given if those persist.  Coronary artery disease involving native coronary artery of native heart with other form of angina pectoris ([place])  -Asymptomatic, continue follow-up with cardiology as above.  No med changes at this time.  No orders of the defined types were placed in this encounter.  Patient Instructions  Try stopping fluoxetine - ok to take every other day for a week then stop that is ok, but usually do not need much of a taper with fluoxetine as it is long acting. If new symptoms, let me know or restart it.   Rash on hand could be a form of eczema.  Okay to use Eucerin or Cetaphil lotion over-the-counter, Vaseline if needed for very dry areas.  Steroid cream prescribed is stronger than over-the-counter cortisone.  That can be used up to twice per day, but use it only as needed and  for short term.  Keep follow-up with dermatologist in March as planned.  Follow-up if that area worsens or has any discharge or surrounding redness.   No other med changes at this time.         Signed,   Merri Ray, MD [place], [facility] 11/10/22 9:35 AM

## 2022-11-22 ENCOUNTER — Telehealth: Payer: Self-pay | Admitting: Family Medicine

## 2022-11-22 NOTE — Telephone Encounter (Signed)
Left message for patient to call back and schedule Medicare Annual Wellness Visit (AWV).   Please offer to do virtually or by telephone.  Left office number and my jabber [phone].  Last AWV:10/06/2021   Please schedule at anytime with Nurse Health Advisor.

## 2022-11-28 ENCOUNTER — Encounter: Payer: Self-pay | Admitting: Family Medicine

## 2022-11-28 NOTE — Telephone Encounter (Signed)
FYI from pt he is back on 20 mg dose

## 2022-12-01 ENCOUNTER — Encounter: Payer: Self-pay | Admitting: Family Medicine

## 2022-12-07 ENCOUNTER — Ambulatory Visit (INDEPENDENT_AMBULATORY_CARE_PROVIDER_SITE_OTHER): Payer: Medicare Other | Admitting: Family Medicine

## 2022-12-07 VITALS — BP 122/68 | HR 58 | Temp 98.6°F | Ht 68.0 in | Wt 167.0 lb

## 2022-12-07 DIAGNOSIS — F32A Depression, unspecified: Secondary | ICD-10-CM | POA: Diagnosis not present

## 2022-12-07 DIAGNOSIS — N529 Male erectile dysfunction, unspecified: Secondary | ICD-10-CM | POA: Diagnosis not present

## 2022-12-07 NOTE — Patient Instructions (Addendum)
Stay on fluoxetine for now.  Let me know if or when you would like to pursue other options for erectile dysfunction.

## 2022-12-07 NOTE — Progress Notes (Signed)
Subjective:  Patient ID: Timothy Escobar, male    DOB: 02/11/1941  Age: 82 y.o. MRN: [id number]  CC:  Chief Complaint  Patient presents with   Erectile Dysfunction    Pt is requesting to discuss his medication     HPI RODARIUS SCANNELL presents for   Erectile dysfunction: Trial off SSRI last visit. Minimal change in symptoms, and mood worsened. Berniece Salines meds daily - feels better back on meds.  No interest in other treatments at this time.   Spouse having some health issues. Spouse having some nerve issues. She saw NS - did not see cause. Plan for EMG - in the works. Having more difficulty moving arm since ER. Will see me tomorrow and ER if worse sooner.   History Patient Active Problem List   Diagnosis Date Noted   Sinus bradycardia 01/23/2017   Dyslipidemia    CAD (coronary artery disease)    Status post coronary artery stent placement    Bradycardia    Hypokalemia    Hyperlipidemia with target LDL less than 70    STEMI (ST elevation myocardial infarction) ([place]) 08/22/2016   Acute ST elevation myocardial infarction (STEMI) involving left anterior descending (LAD) coronary artery ([place]) 08/22/2016   Hip pain 09/28/2012   Chronic depression 09/28/2012   Past Medical History:  Diagnosis Date   Blood transfusion without reported diagnosis    CAD (coronary artery disease)    a. 08/22/2016 Ant STEMI/PCI: LM nl, LAD 30ost, 100p (3.0x15 Synergy DES & 2.75x24 Synergy DES), LCX large/dominant/nl, RCA nondom, nl;  b. 08/2016 Echo: Ef 60-65%, no rwma.   Cataract    Phreesia 03/29/2020   Depression    Dyslipidemia    Heart murmur    Phreesia 03/29/2020   Myocardial infarction [facility])    Phreesia 03/29/2020   Skin cancer    skin cancer   Past Surgical History:  Procedure Laterality Date   APPENDECTOMY     CARDIAC CATHETERIZATION N/A 08/22/2016   Procedure: Left Heart Cath and Coronary Angiography;  Surgeon: Troy Sine, MD;  Location: [place] [facility];  Service:  Cardiovascular;  Laterality: N/A;   CARDIAC CATHETERIZATION N/A 08/22/2016   Procedure: Coronary Stent Intervention;  Surgeon: Troy Sine, MD;  Location: [place] [facility];  Service: Cardiovascular;  Laterality: N/A;  MID LAD   COLON SURGERY N/A    Phreesia 03/29/2020   HERNIA REPAIR     parotid gland cyst removal Right 2012   vocal fold polyp removed     No Known Allergies Prior to Admission medications   Medication Sig Start Date End Date Taking? Authorizing Provider  amLODipine (NORVASC) 5 MG tablet Take 0.5 tablet (2.5 mg) for 1 week, if systolic above [id number] increase to 1 tablet (5 mg). 10/06/21  Yes Troy Sine, MD  aspirin EC 81 MG EC tablet Take 1 tablet (81 mg total) by mouth daily. 08/26/16  Yes Maryellen Pile, MD  atorvastatin (LIPITOR) 80 MG tablet TAKE 1 TABLET BY MOUTH IN THE EVENING AT  6PM 09/08/22  Yes Troy Sine, MD  betamethasone valerate ointment (VALISONE) 0.1 % Apply 1 Application topically 2 (two) times daily. As needed to hand rash. 11/10/22  Yes Wendie Agreste, MD  calcium-vitamin D (OSCAL WITH D) 500-200 MG-UNIT per tablet Take 1 tablet by mouth daily. 1,000 of Vit D3   Yes [provider]  cholecalciferol (VITAMIN D) 1000 units tablet Take 1,000 Units by mouth daily.   Yes [provider]  Ginger Root POWD by Does not apply route. Mixes 1/2 teaspoon of powder into hot tea once daily   Yes [provider]  Multiple Vitamins-Minerals (MULTIVITAMIN WITH MINERALS) tablet Take 1 tablet by mouth once a week. Centrum Silver   Yes [provider]  nitroGLYCERIN (NITROSTAT) 0.4 MG SL tablet Place 1 tablet (0.4 mg total) under the tongue every 5 (five) minutes x 3 doses as needed for chest pain. 03/03/20  Yes Troy Sine, MD  ticagrelor [facility]) 60 MG TABS tablet Take 1 tablet by mouth twice daily 02/06/22  Yes Troy Sine, MD  vitamin C (ASCORBIC ACID) 500 MG tablet Take 500 mg by mouth daily.   Yes [provider]   Social History   Socioeconomic History   Marital status: Married    Spouse name: Not on file   Number of children: 2   Years of education: college   Highest education level: Not on file  Occupational History   Occupation: Retired - Water quality scientist  Tobacco Use   Smoking status: Former    Types: Cigarettes    Quit date: 01/22/1967    Years since quitting: 55.9   Smokeless tobacco: Never  Vaping Use   Vaping Use: Never used  Substance and Sexual Activity   Alcohol use: Yes    Alcohol/week: 14.0 standard drinks of alcohol    Types: 14 Standard drinks or equivalent per week    Comment: wine: 2 glass daily   Drug use: Never   Sexual activity: Never  Other Topics Concern   Not on file  Social History Narrative   Lives w/ wife in [place]. Both are singers, they sing with the [employer] in [place] and with [employer], plus the church choir.   Exercise walk/gardening daily for 1 hr plus   Social Determinants of Health   Financial Resource Strain: Low Risk  (10/06/2021)   Overall Financial Resource Strain (CARDIA)    Difficulty of Paying Living Expenses: Not hard at all  Food Insecurity: No Food Insecurity (10/06/2021)   Hunger Vital Sign    Worried About Running Out of Food in the Last Year: Never true    Ran Out of Food in the Last Year: Never true  Transportation Needs: No Transportation Needs (10/06/2021)   PRAPARE - Hydrologist (Medical): No    Lack of Transportation (Non-Medical): No  Physical Activity: Sufficiently Active (10/06/2021)   Exercise Vital Sign    Days of Exercise per Week: 7 days    Minutes of Exercise per Session: 30 min  Stress: No Stress Concern Present (10/06/2021)   [place]    Feeling of Stress : Not at all  Social Connections: Moderately Integrated (10/06/2021)   Social Connection and Isolation Panel [NHANES]    Frequency of  Communication with Friends and Family: Twice a week    Frequency of Social Gatherings with Friends and Family: Twice a week    Attends Religious Services: Never    Marine scientist or Organizations: Yes    Attends Archivist Meetings: 1 to 4 times per year    Marital Status: Married  Human resources officer Violence: Not At Risk (10/06/2021)   Humiliation, Afraid, Rape, and Kick questionnaire    Fear of Current or Ex-Partner: No    Emotionally Abused: No    Physically Abused: No    Sexually Abused: No  Review of Systems Per HPI.   Objective:   Vitals:   12/07/22 0952  BP: 122/68  Pulse: (!) 58  Temp: 98.6 F (37 C)  TempSrc: Temporal  SpO2: 98%  Weight: 167 lb (75.8 kg)  Height: 5' 8"$  (1.727 m)     Physical Exam Vitals reviewed.  Constitutional:      General: He is not in acute distress.    Appearance: Normal appearance. He is well-developed.  HENT:     Head: Normocephalic and atraumatic.  Cardiovascular:     Rate and Rhythm: Normal rate.  Pulmonary:     Effort: Pulmonary effort is normal.  Neurological:     Mental Status: He is alert and oriented to person, place, and time.  Psychiatric:        Mood and Affect: Mood normal.     Assessment & Plan:  KYO CASTAGNO is a 82 y.o. male . Depression, unspecified depression type  -Decreased control after trial off meds, now improved back on fluoxetine.  Continue same.  Erectile dysfunction, unspecified erectile dysfunction type  -Minimal change off SSRI.  Declines other treatments at this time.  Certainly could consider other medication options but again declined at present.  No orders of the defined types were placed in this encounter.  Patient Instructions  Stay on fluoxetine for now.  Let me know if or when you would like to pursue other options for erectile dysfunction.      Signed,   Merri Ray, MD [place], [facility] 12/07/22 10:59  AM

## 2022-12-10 ENCOUNTER — Encounter: Payer: Self-pay | Admitting: Family Medicine

## 2022-12-27 ENCOUNTER — Ambulatory Visit (INDEPENDENT_AMBULATORY_CARE_PROVIDER_SITE_OTHER): Payer: Medicare Other | Admitting: Family Medicine

## 2022-12-27 VITALS — BP 124/60 | HR 65 | Temp 97.6°F | Ht 68.0 in | Wt 169.4 lb

## 2022-12-27 DIAGNOSIS — N529 Male erectile dysfunction, unspecified: Secondary | ICD-10-CM

## 2022-12-27 MED ORDER — SILDENAFIL CITRATE 50 MG PO TABS: 25.0000 mg | ORAL_TABLET | Freq: Every day | ORAL | 5 refills | Status: DC | PRN

## 2022-12-27 NOTE — Progress Notes (Unsigned)
Subjective:  Patient ID: Timothy Escobar, male    DOB: June 03, 1941  Age: 82 y.o. MRN: [id number]  CC:  Chief Complaint  Patient presents with   Erectile Dysfunction    Pt would like to discuss starting medication     HPI [name] presents for   Erectile dysfunction: See prior notes. Trial off SSRI without significant changes in meds, and worsening mood symptoms.  Difficulty with both onset and maintenance of erection.   Hx ox MI and PTCA, has ntg SL, but never used and no daily nitrate. Will clear med with cardiology first.    History Patient Active Problem List   Diagnosis Date Noted   Sinus bradycardia 01/23/2017   Dyslipidemia    CAD (coronary artery disease)    Status post coronary artery stent placement    Bradycardia    Hypokalemia    Hyperlipidemia with target LDL less than 70    STEMI (ST elevation myocardial infarction) ([place]) 08/22/2016   Acute ST elevation myocardial infarction (STEMI) involving left anterior descending (LAD) coronary artery ([place]) 08/22/2016   Hip pain 09/28/2012   Chronic depression 09/28/2012   Past Medical History:  Diagnosis Date   Blood transfusion without reported diagnosis    CAD (coronary artery disease)    a. 08/22/2016 Ant STEMI/PCI: LM nl, LAD 30ost, 100p (3.0x15 Synergy DES & 2.75x24 Synergy DES), LCX large/dominant/nl, RCA nondom, nl;  b. 08/2016 Echo: Ef 60-65%, no rwma.   Cataract    Phreesia 03/29/2020   Depression    Dyslipidemia    Heart murmur    Phreesia 03/29/2020   Myocardial infarction [facility])    Phreesia 03/29/2020   Skin cancer    skin cancer   Past Surgical History:  Procedure Laterality Date   APPENDECTOMY     CARDIAC CATHETERIZATION N/A 08/22/2016   Procedure: Left Heart Cath and Coronary Angiography;  Surgeon: Troy Sine, MD;  Location: [place] [facility];  Service: Cardiovascular;  Laterality: N/A;   CARDIAC CATHETERIZATION N/A 08/22/2016   Procedure: Coronary Stent Intervention;  Surgeon:  Troy Sine, MD;  Location: [place] [facility];  Service: Cardiovascular;  Laterality: N/A;  MID LAD   COLON SURGERY N/A    Phreesia 03/29/2020   HERNIA REPAIR     parotid gland cyst removal Right 2012   vocal fold polyp removed     No Known Allergies Prior to Admission medications   Medication Sig Start Date End Date Taking? Authorizing Provider  amLODipine (NORVASC) 5 MG tablet Take 0.5 tablet (2.5 mg) for 1 week, if systolic above [id number] increase to 1 tablet (5 mg). 10/06/21  Yes Troy Sine, MD  aspirin EC 81 MG EC tablet Take 1 tablet (81 mg total) by mouth daily. 08/26/16  Yes Maryellen Pile, MD  atorvastatin (LIPITOR) 80 MG tablet TAKE 1 TABLET BY MOUTH IN THE EVENING AT  6PM 09/08/22  Yes Troy Sine, MD  betamethasone valerate ointment (VALISONE) 0.1 % Apply 1 Application topically 2 (two) times daily. As needed to hand rash. 11/10/22  Yes Wendie Agreste, MD  calcium-vitamin D (OSCAL WITH D) 500-200 MG-UNIT per tablet Take 1 tablet by mouth daily. 1,000 of Vit D3   Yes [provider]  cholecalciferol (VITAMIN D) 1000 units tablet Take 1,000 Units by mouth daily.   Yes [provider]  FLUoxetine (PROZAC) 20 MG capsule  11/27/22  Yes [provider]  Ginger Root POWD by Does not apply route. Mixes 1/2  teaspoon of powder into hot tea once daily   Yes [provider]  Multiple Vitamins-Minerals (MULTIVITAMIN WITH MINERALS) tablet Take 1 tablet by mouth once a week. Centrum Silver   Yes [provider]  nitroGLYCERIN (NITROSTAT) 0.4 MG SL tablet Place 1 tablet (0.4 mg total) under the tongue every 5 (five) minutes x 3 doses as needed for chest pain. 03/03/20  Yes Troy Sine, MD  ticagrelor [facility]) 60 MG TABS tablet Take 1 tablet by mouth twice daily 02/06/22  Yes Troy Sine, MD  vitamin C (ASCORBIC ACID) 500 MG tablet Take 500 mg by mouth daily.   Yes [provider]   Social History   Socioeconomic History    Marital status: Married    Spouse name: Not on file   Number of children: 2   Years of education: college   Highest education level: Not on file  Occupational History   Occupation: Retired - Water quality scientist  Tobacco Use   Smoking status: Former    Types: Cigarettes    Quit date: 01/22/1967    Years since quitting: 55.9   Smokeless tobacco: Never  Vaping Use   Vaping Use: Never used  Substance and Sexual Activity   Alcohol use: Yes    Alcohol/week: 14.0 standard drinks of alcohol    Types: 14 Standard drinks or equivalent per week    Comment: wine: 2 glass daily   Drug use: Never   Sexual activity: Never  Other Topics Concern   Not on file  Social History Narrative   Lives w/ wife in [place]. 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, plus the church choir.   Exercise walk/gardening daily for 1 hr plus   Social Determinants of Health   Financial Resource Strain: Low Risk  (10/06/2021)   Overall Financial Resource Strain (CARDIA)    Difficulty of Paying Living Expenses: Not hard at all  Food Insecurity: No Food Insecurity (10/06/2021)   Hunger Vital Sign    Worried About Running Out of Food in the Last Year: Never true    Ran Out of Food in the Last Year: Never true  Transportation Needs: No Transportation Needs (10/06/2021)   PRAPARE - Hydrologist (Medical): No    Lack of Transportation (Non-Medical): No  Physical Activity: Sufficiently Active (10/06/2021)   Exercise Vital Sign    Days of Exercise per Week: 7 days    Minutes of Exercise per Session: 30 min  Stress: No Stress Concern Present (10/06/2021)   [place]    Feeling of Stress : Not at all  Social Connections: Moderately Integrated (10/06/2021)   Social Connection and Isolation Panel [NHANES]    Frequency of Communication with Friends and Family: Twice a week     Frequency of Social Gatherings with Friends and Family: Twice a week    Attends Religious Services: Never    Marine scientist or Organizations: Yes    Attends Archivist Meetings: 1 to 4 times per year    Marital Status: Married  Human resources officer Violence: Not At Risk (10/06/2021)   Humiliation, Afraid, Rape, and Kick questionnaire    Fear of Current or Ex-Partner: No    Emotionally Abused: No    Physically Abused: No    Sexually Abused: No    Review of Systems  Per HPI.  Objective:   Vitals:  12/27/22 1554  BP: 124/60  Pulse: 65  Temp: 97.6 F (36.4 C)  TempSrc: Oral  SpO2: 96%  Weight: 169 lb 6.4 oz (76.8 kg)  Height: '5\' 8"'$  (1.727 m)     Physical Exam     Assessment & Plan:  SYLER Escobar is a 82 y.o. male . No diagnosis found.   No orders of the defined types were placed in this encounter.  There are no Patient Instructions on file for this visit.    Signed,   Merri Ray, MD [place], [facility] 12/27/22 4:44 PM

## 2022-12-27 NOTE — Patient Instructions (Addendum)
Thanks for coming in today.  Will try low-dose Viagra as long as your cardiologist is okay with you taking that medication.  As we discussed you cannot take that medication nitrates including the nitroglycerin.  If cardiology would prefer a different approach, let me know and I will refer you to urology to discuss other methods.  If any new hearing or vision symptoms with that medication, be seen right away.  I do not expect that to happen.  Headache and flushing are common side effects, watch for low blood pressure symptoms as well.  Take care.  Erectile Dysfunction Erectile dysfunction (ED) is the inability to get or keep an erection in order to have sexual intercourse. ED is considered a symptom of an underlying disorder and is not considered a disease. ED may include: Inability to get an erection. Lack of enough hardness of the erection to allow penetration. Loss of erection before sex is finished. What are the causes? This condition may be caused by: Physical causes, such as: Artery problems. This may include heart disease, high blood pressure, atherosclerosis, and diabetes. Hormonal problems, such as low testosterone. Obesity. Nerve problems. This may include back or pelvic injuries, multiple sclerosis, Parkinson's disease, spinal cord injury, and stroke. Certain medicines, such as: Pain relievers. Antidepressants. Blood pressure medicines and water pills (diuretics). Cancer medicines. Antihistamines. Muscle relaxants. Lifestyle factors, such as: Use of drugs such as marijuana, cocaine, or opioids. Excessive use of alcohol. Smoking. Lack of physical activity or exercise. Psychological causes, such as: Anxiety or stress. Sadness or depression. Exhaustion. Fear about sexual performance. Guilt. What are the signs or symptoms? Symptoms of this condition include: Inability to get an erection. Lack of enough hardness of the erection to allow penetration. Loss of the erection  before sex is finished. Sometimes having normal erections, but with frequent unsatisfactory episodes. Low sexual satisfaction in either partner due to erection problems. A curved penis occurring with erection. The curve may cause pain, or the penis may be too curved to allow for intercourse. Never having nighttime or morning erections. How is this diagnosed? This condition is often diagnosed by: Performing a physical exam to find other diseases or specific problems with the penis. Asking you detailed questions about the problem. Doing tests, such as: Blood tests to check for diabetes mellitus or high cholesterol, or to measure hormone levels. Other tests to check for underlying health conditions. An ultrasound exam to check for scarring. A test to check blood flow to the penis. Doing a sleep study at home to measure nighttime erections. How is this treated? This condition may be treated by: Medicines, such as: Medicine taken by mouth to help you achieve an erection (oral medicine). Hormone replacement therapy to replace low testosterone levels. Medicine that is injected into the penis. Your health care provider may instruct you how to give yourself these injections at home. Medicine that is delivered with a short applicator tube. The tube is inserted into the opening at the tip of the penis, which is the opening of the urethra. A tiny pellet of medicine is put in the urethra. The pellet dissolves and enhances erectile function. This is also called MUSE (medicated urethral system for erections) therapy. Vacuum pump. This is a pump with a ring on it. The pump and ring are placed on the penis and used to create pressure that helps the penis become erect. Penile implant surgery. In this procedure, you may receive: An inflatable implant. This consists of cylinders, a pump, and a  reservoir. The cylinders can be inflated with a fluid that helps to create an erection, and they can be deflated after  intercourse. A semi-rigid implant. This consists of two silicone rubber rods. The rods provide some rigidity. They are also flexible, so the penis can both curve downward in its normal position and become straight for sexual intercourse. Blood vessel surgery to improve blood flow to the penis. During this procedure, a blood vessel from a different part of the body is placed into the penis to allow blood to flow around (bypass) damaged or blocked blood vessels. Lifestyle changes, such as exercising more, losing weight, and quitting smoking. Follow these instructions at home: Medicines  Take over-the-counter and prescription medicines only as told by your health care provider. Do not increase the dosage without first discussing it with your health care provider. If you are using self-injections, do injections as directed by your health care provider. Make sure you avoid any veins that are on the surface of the penis. After giving an injection, apply pressure to the injection site for 5 minutes. Talk to your health care provider about how to prevent headaches while taking ED medicines. These medicines may cause a sudden headache due to the increase in blood flow in your body. General instructions Exercise regularly, as directed by your health care provider. Work with your health care provider to lose weight, if needed. Do not use any products that contain nicotine or tobacco. These products include cigarettes, chewing tobacco, and vaping devices, such as e-cigarettes. If you need help quitting, ask your health care provider. Before using a vacuum pump, read the instructions that come with the pump and discuss any questions with your health care provider. Keep all follow-up visits. This is important. Contact a health care provider if: You feel nauseous. You are vomiting. You get sudden headaches while taking ED medicines. You have any concerns about your sexual health. Get help right away if: You  are taking oral or injectable medicines and you have an erection that lasts longer than 4 hours. If your health care provider is unavailable, go to the nearest emergency room for evaluation. An erection that lasts much longer than 4 hours can result in permanent damage to your penis. You have severe pain in your groin or abdomen. You develop redness or severe swelling of your penis. You have redness spreading at your groin or lower abdomen. You are unable to urinate. You experience chest pain or a rapid heartbeat (palpitations) after taking oral medicines. These symptoms may represent a serious problem that is an emergency. Do not wait to see if the symptoms will go away. Get medical help right away. Call your local emergency services (911 in the [place] Do not drive yourself to the hospital. Summary Erectile dysfunction (ED) is the inability to get or keep an erection during sexual intercourse. This condition is diagnosed based on a physical exam, your symptoms, and tests to determine the cause. Treatment varies depending on the cause and may include medicines, hormone therapy, surgery, or a vacuum pump. You may need follow-up visits to make sure that you are using your medicines or devices correctly. Get help right away if you are taking or injecting medicines and you have an erection that lasts longer than 4 hours. This information is not intended to replace advice given to you by your health care provider. Make sure you discuss any questions you have with your health care provider. Document Revised: 01/05/2021 Document Reviewed: 01/05/2021 Elsevier Patient Education  [place].

## 2022-12-28 ENCOUNTER — Encounter: Payer: Self-pay | Admitting: Cardiovascular Disease

## 2022-12-28 ENCOUNTER — Encounter: Payer: Self-pay | Admitting: Family Medicine

## 2023-01-11 ENCOUNTER — Telehealth: Payer: Self-pay | Admitting: Family Medicine

## 2023-01-11 NOTE — Telephone Encounter (Signed)
[facility] to schedule their annual wellness visit. Appointment made for 01/18/2023.  Thank you,  [place] Direct dial  [phone]

## 2023-01-18 ENCOUNTER — Encounter: Payer: Self-pay | Admitting: Family Medicine

## 2023-01-18 ENCOUNTER — Ambulatory Visit (INDEPENDENT_AMBULATORY_CARE_PROVIDER_SITE_OTHER): Payer: Medicare Other | Admitting: Family Medicine

## 2023-01-18 DIAGNOSIS — Z0000 Encounter for general adult medical examination without abnormal findings: Secondary | ICD-10-CM | POA: Diagnosis not present

## 2023-01-18 NOTE — Progress Notes (Signed)
Note reviewed, and agree with documentation and plan. I was available in office during the time of this wellness visit, or available by phone or text if needed and available for questions or concerns regarding visit or follow-up issues to be addressed.  Signed,   Merri Ray, MD [place], [facility] 01/18/23 5:20 PM

## 2023-01-18 NOTE — Patient Instructions (Signed)
Timothy Escobar , Thank you for taking time to come for your Medicare Wellness Visit. I appreciate your ongoing commitment to your health goals. Please review the following plan we discussed and let me know if I can assist you in the future.   Screening recommendations/referrals: Colonoscopy: up to date Recommended yearly ophthalmology/optometry visit for glaucoma screening and checkup Recommended yearly dental visit for hygiene and checkup  Vaccinations: Influenza vaccine: up to date Pneumococcal vaccine: up to date Tdap vaccine: Education provided Shingles vaccine:     Advanced directives:   updating  Conditions/risks identified:     Preventive Care 80 Years and Older, Male Preventive care refers to lifestyle choices and visits with your health care provider that can promote health and wellness. What does preventive care include? A yearly physical exam. This is also called an annual well check. Dental exams once or twice a year. Routine eye exams. Ask your health care provider how often you should have your eyes checked. Personal lifestyle choices, including: Daily care of your teeth and gums. Regular physical activity. Eating a healthy diet. Avoiding tobacco and drug use. Limiting alcohol use. Practicing safe sex. Taking low doses of aspirin every day. Taking vitamin and mineral supplements as recommended by your health care provider. What happens during an annual well check? The services and screenings done by your health care provider during your annual well check will depend on your age, overall health, lifestyle risk factors, and family history of disease. Counseling  Your health care provider may ask you questions about your: Alcohol use. Tobacco use. Drug use. Emotional well-being. Home and relationship well-being. Sexual activity. Eating habits. History of falls. Memory and ability to understand (cognition). Work and work Statistician. Screening  You may have the  following tests or measurements: Height, weight, and BMI. Blood pressure. Lipid and cholesterol levels. These may be checked every 5 years, or more frequently if you are over 54 years old. Skin check. Lung cancer screening. You may have this screening every year starting at age 80 if you have a 30-pack-year history of smoking and currently smoke or have quit within the past 15 years. Fecal occult blood test (FOBT) of the stool. You may have this test every year starting at age 80. Flexible sigmoidoscopy or colonoscopy. You may have a sigmoidoscopy every 5 years or a colonoscopy every 10 years starting at age 80. Prostate cancer screening. Recommendations will vary depending on your family history and other risks. Hepatitis C blood test. Hepatitis B blood test. Sexually transmitted disease (STD) testing. Diabetes screening. This is done by checking your blood sugar (glucose) after you have not eaten for a while (fasting). You may have this done every 1-3 years. Abdominal aortic aneurysm (AAA) screening. You may need this if you are a current or former smoker. Osteoporosis. You may be screened starting at age 80 if you are at high risk. Talk with your health care provider about your test results, treatment options, and if necessary, the need for more tests. Vaccines  Your health care provider may recommend certain vaccines, such as: Influenza vaccine. This is recommended every year. Tetanus, diphtheria, and acellular pertussis (Tdap, Td) vaccine. You may need a Td booster every 10 years. Zoster vaccine. You may need this after age 29. Pneumococcal 13-valent conjugate (PCV13) vaccine. One dose is recommended after age 63. Pneumococcal polysaccharide (PPSV23) vaccine. One dose is recommended after age 5. Talk to your health care provider about which screenings and vaccines you need and how often you  need them. This information is not intended to replace advice given to you by your health care  provider. Make sure you discuss any questions you have with your health care provider. Document Released: 11/05/2015 Document Revised: 06/28/2016 Document Reviewed: 08/10/2015 Elsevier Interactive Patient Education  2017 [place] Prevention in the Home Falls can cause injuries. They can happen to people of all ages. There are many things you can do to make your home safe and to help prevent falls. What can I do on the outside of my home? Regularly fix the edges of walkways and driveways and fix any cracks. Remove anything that might make you trip as you walk through a door, such as a raised step or threshold. Trim any bushes or trees on the path to your home. Use bright outdoor lighting. Clear any walking paths of anything that might make someone trip, such as rocks or tools. Regularly check to see if handrails are loose or broken. Make sure that both sides of any steps have handrails. Any raised decks and porches should have guardrails on the edges. Have any leaves, snow, or ice cleared regularly. Use sand or salt on walking paths during winter. Clean up any spills in your garage right away. This includes oil or grease spills. What can I do in the bathroom? Use night lights. Install grab bars by the toilet and in the tub and shower. Do not use towel bars as grab bars. Use non-skid mats or decals in the tub or shower. If you need to sit down in the shower, use a plastic, non-slip stool. Keep the floor dry. Clean up any water that spills on the floor as soon as it happens. Remove soap buildup in the tub or shower regularly. Attach bath mats securely with double-sided non-slip rug tape. Do not have throw rugs and other things on the floor that can make you trip. What can I do in the bedroom? Use night lights. Make sure that you have a light by your bed that is easy to reach. Do not use any sheets or blankets that are too big for your bed. They should not hang down onto the  floor. Have a firm chair that has side arms. You can use this for support while you get dressed. Do not have throw rugs and other things on the floor that can make you trip. What can I do in the kitchen? Clean up any spills right away. Avoid walking on wet floors. Keep items that you use a lot in easy-to-reach places. If you need to reach something above you, use a strong step stool that has a grab bar. Keep electrical cords out of the way. Do not use floor polish or wax that makes floors slippery. If you must use wax, use non-skid floor wax. Do not have throw rugs and other things on the floor that can make you trip. What can I do with my stairs? Do not leave any items on the stairs. Make sure that there are handrails on both sides of the stairs and use them. Fix handrails that are broken or loose. Make sure that handrails are as long as the stairways. Check any carpeting to make sure that it is firmly attached to the stairs. Fix any carpet that is loose or worn. Avoid having throw rugs at the top or bottom of the stairs. If you do have throw rugs, attach them to the floor with carpet tape. Make sure that you have a light switch  at the top of the stairs and the bottom of the stairs. If you do not have them, ask someone to add them for you. What else can I do to help prevent falls? Wear shoes that: Do not have high heels. Have rubber bottoms. Are comfortable and fit you well. Are closed at the toe. Do not wear sandals. If you use a stepladder: Make sure that it is fully opened. Do not climb a closed stepladder. Make sure that both sides of the stepladder are locked into place. Ask someone to hold it for you, if possible. Clearly mark and make sure that you can see: Any grab bars or handrails. First and last steps. Where the edge of each step is. Use tools that help you move around (mobility aids) if they are needed. These include: Canes. Walkers. Scooters. Crutches. Turn on the  lights when you go into a dark area. Replace any light bulbs as soon as they burn out. Set up your furniture so you have a clear path. Avoid moving your furniture around. If any of your floors are uneven, fix them. If there are any pets around you, be aware of where they are. Review your medicines with your doctor. Some medicines can make you feel dizzy. This can increase your chance of falling. Ask your doctor what other things that you can do to help prevent falls. This information is not intended to replace advice given to you by your health care provider. Make sure you discuss any questions you have with your health care provider. Document Released: 08/05/2009 Document Revised: 03/16/2016 Document Reviewed: 11/13/2014 Elsevier Interactive Patient Education  2017 [employer].

## 2023-01-18 NOTE — Progress Notes (Addendum)
Subjective:   Timothy Escobar is a 82 y.o. male who presents for Medicare Annual/Subsequent preventive examination.  I connected with  [name] on 01/18/23 by a telephone enabled telemedicine application and verified that I am speaking with the correct person using two identifiers.   I discussed the limitations of evaluation and management by telemedicine. The patient expressed understanding and agreed to proceed.  Patient location: home  Provider location: Tele-Health/home     Review of Systems     Cardiac Risk Factors include: advanced age (>60men, >26 women);male gender;family history of premature cardiovascular disease     Objective:    There were no vitals filed for this visit. There is no height or weight on file to calculate BMI.     01/18/2023    8:10 AM 10/06/2021    1:40 PM 09/28/2020    8:16 AM 08/07/2019   10:06 AM 12/18/2017    9:20 AM 02/15/2017    3:17 PM 01/21/2017    9:44 PM  Advanced Directives  Does Patient Have a Medical Advance Directive? Yes Yes Yes Yes Yes Yes Yes  Type of Paramedic of [place];Living will Living will Living will;Healthcare Power of Attorney Living will [place];Living will [place];Living will  Does patient want to make changes to medical advance directive?   No - Patient declined No - Patient declined No - Patient declined    Copy of [place] in Chart? No - copy requested No - copy requested  No - copy requested   No - copy requested    Current Medications (verified) Outpatient Encounter Medications as of 01/18/2023  Medication Sig   amLODipine (NORVASC) 5 MG tablet Take 0.5 tablet (2.5 mg) for 1 week, if systolic above [id number] increase to 1 tablet (5 mg).   aspirin EC 81 MG EC tablet Take 1 tablet (81 mg total) by mouth daily.   atorvastatin (LIPITOR) 80 MG tablet TAKE 1 TABLET BY MOUTH IN THE EVENING AT  6PM    calcium-vitamin D (OSCAL WITH D) 500-200 MG-UNIT per tablet Take 1 tablet by mouth daily. 1,000 of Vit D3   cholecalciferol (VITAMIN D) 1000 units tablet Take 1,000 Units by mouth daily.   clobetasol cream (TEMOVATE) [id number] % Apply 1 Application topically 2 (two) times daily.   FLUoxetine (PROZAC) 20 MG capsule    Ginger Root POWD by Does not apply route. Mixes 1/2 teaspoon of powder into hot tea once daily   Multiple Vitamins-Minerals (MULTIVITAMIN WITH MINERALS) tablet Take 1 tablet by mouth once a week. Centrum Silver   nitroGLYCERIN (NITROSTAT) 0.4 MG SL tablet Place 1 tablet (0.4 mg total) under the tongue every 5 (five) minutes x 3 doses as needed for chest pain.   sildenafil (VIAGRA) 50 MG tablet Take 0.5-1 tablets (25-50 mg total) by mouth daily as needed for erectile dysfunction.   ticagrelor (BRILINTA) 60 MG TABS tablet Take 1 tablet by mouth twice daily   vitamin C (ASCORBIC ACID) 500 MG tablet Take 500 mg by mouth daily.   betamethasone valerate ointment (VALISONE) 0.1 % Apply 1 Application topically 2 (two) times daily. As needed to hand rash.   No facility-administered encounter medications on file as of 01/18/2023.    Allergies (verified) Patient has no known allergies.   History: Past Medical History:  Diagnosis Date   Blood transfusion without reported diagnosis    CAD (coronary artery disease)  a. 08/22/2016 Ant STEMI/PCI: LM nl, LAD 30ost, 100p (3.0x15 Synergy DES & 2.75x24 Synergy DES), LCX large/dominant/nl, RCA nondom, nl;  b. 08/2016 Echo: Ef 60-65%, no rwma.   Cataract    Phreesia 03/29/2020   Depression    Dyslipidemia    Heart murmur    Phreesia 03/29/2020   Myocardial infarction [facility])    Phreesia 03/29/2020   Skin cancer    skin cancer   Past Surgical History:  Procedure Laterality Date   APPENDECTOMY     CARDIAC CATHETERIZATION N/A 08/22/2016   Procedure: Left Heart Cath and Coronary Angiography;  Surgeon: Troy Sine, MD;  Location: [facility];  Service: Cardiovascular;  Laterality: N/A;   CARDIAC CATHETERIZATION N/A 08/22/2016   Procedure: Coronary Stent Intervention;  Surgeon: Troy Sine, MD;  Location: [facility];  Service: Cardiovascular;  Laterality: N/A;  MID LAD   COLON SURGERY N/A    Phreesia 03/29/2020   HERNIA REPAIR     parotid gland cyst removal Right 2012   vocal fold polyp removed     Family History  Problem Relation Age of Onset   Dementia Mother    Cancer Father        skin   Heart disease Maternal Grandmother    Stroke Maternal Grandmother    Cancer Sister        breast (pt did not specific L or R)   Cancer Brother        bladder and pancreatic   Heart disease Brother    Cancer Brother    Heart disease Sister    Cancer Sister    Hypertension Sister    Cancer Paternal Grandmother    Social History   Socioeconomic History   Marital status: Married    Spouse name: Not on file   Number of children: 2   Years of education: college   Highest education level: Not on file  Occupational History   Occupation: Retired - Water quality scientist  Tobacco Use   Smoking status: Former    Types: Cigarettes    Quit date: 01/22/1967    Years since quitting: 56.0   Smokeless tobacco: Never  Vaping Use   Vaping Use: Never used  Substance and Sexual Activity   Alcohol use: Yes    Alcohol/week: 14.0 standard drinks of alcohol    Types: 14 Standard drinks or equivalent per week    Comment: wine: 2 glass daily   Drug use: Never   Sexual activity: Never  Other Topics Concern   Not on file  Social History Narrative   Lives w/ wife in [place]. Both are singers, they sing with the [employer] in [place] and with [employer], plus the church choir.   Exercise walk/gardening daily for 1 hr plus   Social Determinants of Health   Financial Resource Strain: Low Risk  (01/18/2023)   Overall Financial Resource Strain (CARDIA)    Difficulty of Paying Living Expenses: Not hard at all  Food  Insecurity: No Food Insecurity (01/18/2023)   Hunger Vital Sign    Worried About Running Out of Food in the Last Year: Never true    Ran Out of Food in the Last Year: Never true  Transportation Needs: No Transportation Needs (01/18/2023)   PRAPARE - Hydrologist (Medical): No    Lack of Transportation (Non-Medical): No  Physical Activity: Insufficiently Active (01/18/2023)   Exercise Vital Sign    Days of Exercise  per Week: 4 days    Minutes of Exercise per Session: 30 min  Stress: No Stress Concern Present (01/18/2023)   [place]    Feeling of Stress : Not at all  Social Connections: Socially Integrated (01/18/2023)   Social Connection and Isolation Panel [NHANES]    Frequency of Communication with Friends and Family: More than three times a week    Frequency of Social Gatherings with Friends and Family: Twice a week    Attends Religious Services: More than 4 times per year    Active Member of [employer] or Organizations: Yes    Attends Music therapist: More than 4 times per year    Marital Status: Married    Tobacco Counseling Counseling given: Not Answered   Clinical Intake:  Pre-visit preparation completed: Yes  Pain : No/denies pain     Diabetes: No  How often do you need to have someone help you when you read instructions, pamphlets, or other written materials from your doctor or pharmacy?: (P) 1 - Never  Diabetic?  no  Interpreter Needed?: No  Information entered by :: Leroy Kennedy LPN   Activities of Daily Living    01/18/2023    8:18 AM 01/15/2023    7:42 AM  In your present state of health, do you have any difficulty performing the following activities:  Hearing? 0 0  Vision? 0 0  Difficulty concentrating or making decisions? 0 0  Walking or climbing stairs? 0 0  Dressing or bathing? 0 0  Doing errands, shopping? 0 0  Preparing Food and eating ? N N   Using the Toilet? N N  In the past six months, have you accidently leaked urine? N N  Do you have problems with loss of bowel control? N N  Managing your Medications? N N  Managing your Finances? N N  Housekeeping or managing your Housekeeping? N N    Patient Care Team: Wendie Agreste, MD as PCP - General (Family Medicine) Troy Sine, MD as PCP - Cardiology (Cardiology) Leta Baptist, MD as Consulting Physician (Otolaryngology)  Indicate any recent Medical Services you may have received from other than [facility] providers in the past year (date may be approximate).     Assessment:   This is a routine wellness examination for Timothy Escobar.  Hearing/Vision screen Hearing Screening - Comments:: No trouble hearing Vision Screening - Comments:: Up to date [facility]  Dietary issues and exercise activities discussed: Current Exercise Habits: Home exercise routine, Type of exercise: walking, Time (Minutes): 20, Frequency (Times/Week): 3, Weekly Exercise (Minutes/Week): 60, Intensity: Mild   Goals Addressed             This Visit's Progress    Patient Stated   On track    Develop his cycle of songs to sing.       Patient Stated   On track    Would like to get back in to painting.     Patient Stated       Would like to get back to oil painting       Depression Screen    01/18/2023    8:17 AM 11/10/2022    9:17 AM 09/04/2022   11:04 AM 05/10/2022    1:40 PM 10/27/2021    9:58 AM 10/13/2021    2:40 PM 10/06/2021    1:42 PM  PHQ 2/9 Scores  PHQ - 2 Score 0 0 1 1 0 0 0  PHQ- 9 Score 0 0 4 2  0     Fall Risk    01/18/2023    8:10 AM 01/15/2023    7:42 AM 11/10/2022    9:17 AM 09/04/2022   11:05 AM 05/10/2022    1:40 PM  [place] in the past year? 0 1 0 1 0  Number falls in past yr: 0 1 0 1 0  Injury with Fall? 0 0 0 1 0  Risk for fall due to :   No Fall Risks History of fall(s) No Fall Risks  Follow up Falls evaluation completed;Education provided;Falls prevention  discussed  Falls evaluation completed Falls evaluation completed Falls evaluation completed    FALL RISK PREVENTION PERTAINING TO THE HOME:  Any stairs in or around the home? Yes  If so, are there any without handrails? No  Home free of loose throw rugs in walkways, pet beds, electrical cords, etc? Yes  Adequate lighting in your home to reduce risk of falls? Yes   ASSISTIVE DEVICES UTILIZED TO PREVENT FALLS:  Life alert? No  Use of a cane, walker or w/c? No  Grab bars in the bathroom? No  Shower chair or bench in shower? Yes  Elevated toilet seat or a handicapped toilet? No   TIMED UP AND GO:  Was the test performed? No .    Cognitive Function:        01/18/2023    8:13 AM 08/07/2019   10:07 AM 12/18/2017    9:34 AM  6CIT Screen  What Year? 0 points 0 points 0 points  What month? 0 points 0 points 0 points  What time?  0 points 0 points  Count back from 20 0 points 0 points 0 points  Months in reverse 0 points 0 points 0 points  Repeat phrase 0 points 2 points 0 points  Total Score  2 points 0 points    Immunizations Immunization History  Administered Date(s) Administered   Fluad Quad(high Dose 65+) 07/25/2019, 07/21/2020, 08/09/2022   Influenza,inj,Quad PF,6+ Mos 07/18/2013, 07/29/2014, 08/30/2015, 07/20/2016, 08/01/2017, 08/21/2018   Influenza-Unspecified 07/23/2012, 07/23/2021   PFIZER(Purple Top)SARS-COV-2 Vaccination 11/12/2019, 11/30/2019, 07/20/2020, 02/02/2021   Pneumococcal Conjugate-13 12/18/2017   Pneumococcal Polysaccharide-23 05/13/2008   Td 05/13/2008   Tdap 09/26/2012   Zoster, Live 10/23/2009    TDAP status: Due, Education has been provided regarding the importance of this vaccine. Advised may receive this vaccine at local pharmacy or Health Dept. Aware to provide a copy of the vaccination record if obtained from local pharmacy or Health Dept. Verbalized acceptance and understanding.  Flu Vaccine status: Up to date  Pneumococcal vaccine status:  Up to date  Covid-19 vaccine status: Completed vaccines  Qualifies for Shingles Vaccine? Yes   Zostavax completed No   Shingrix Completed?: No.    Education has been provided regarding the importance of this vaccine. Patient has been advised to call insurance company to determine out of pocket expense if they have not yet received this vaccine. Advised may also receive vaccine at local pharmacy or Health Dept. Verbalized acceptance and understanding.  Screening Tests Health Maintenance  Topic Date Due   COVID-19 Vaccine (5 - 2023-24 season) 02/03/2023 (Originally 06/23/2022)   Medicare Annual Wellness (AWV)  01/18/2024   COLONOSCOPY (Pts 45-79yrs Insurance coverage will need to be confirmed)  07/06/2024   Pneumonia Vaccine 62+ Years old  Completed   INFLUENZA VACCINE  Completed   HPV VACCINES  Aged Out   DTaP/Tdap/Td  Discontinued  Zoster Vaccines- Shingrix  Discontinued    Health Maintenance  There are no preventive care reminders to display for this patient.   Colorectal cancer screening: Type of screening: Colonoscopy. Completed 2020. Repeat every 5 years  Lung Cancer Screening: (Low Dose CT Chest recommended if Age 16-80 years, 30 pack-year currently smoking OR have quit w/in 15years.) does not qualify.   Lung Cancer Screening Referral:   Additional Screening:  Hepatitis C Screening: does not qualify; Completed 2023  Vision Screening: Recommended annual ophthalmology exams for early detection of glaucoma and other disorders of the eye. Is the patient up to date with their annual eye exam?  Yes  Who is the provider or what is the name of the office in which the patient attends annual eye exams? [facility] If pt is not established with a provider, would they like to be referred to a provider to establish care? No .   Dental Screening: Recommended annual dental exams for proper oral hygiene  Community Resource Referral / Chronic Care Management: CRR required this visit?  No    CCM required this visit?  No      Plan:     I have personally reviewed and noted the following in the patient's chart:   Medical and social history Use of alcohol, tobacco or illicit drugs  Current medications and supplements including opioid prescriptions. Patient is not currently taking opioid prescriptions. Functional ability and status Nutritional status Physical activity Advanced directives List of other physicians Hospitalizations, surgeries, and ER visits in previous 12 months Vitals Screenings to include cognitive, depression, and falls Referrals and appointments  In addition, I have reviewed and discussed with patient certain preventive protocols, quality metrics, and best practice recommendations. A written personalized care plan for preventive services as well as general preventive health recommendations were provided to patient.     Leroy Kennedy, LPN   [id number]   Nurse Notes:

## 2023-01-22 ENCOUNTER — Encounter: Payer: Self-pay | Admitting: Family Medicine

## 2023-02-13 ENCOUNTER — Other Ambulatory Visit: Payer: Self-pay | Admitting: Cardiovascular Disease

## 2023-04-15 ENCOUNTER — Other Ambulatory Visit: Payer: Self-pay | Admitting: Cardiovascular Disease

## 2023-04-16 ENCOUNTER — Encounter: Payer: Self-pay | Admitting: Podiatry

## 2023-04-16 ENCOUNTER — Ambulatory Visit: Payer: Medicare Other | Admitting: Podiatry

## 2023-04-16 DIAGNOSIS — L309 Dermatitis, unspecified: Secondary | ICD-10-CM

## 2023-04-17 NOTE — Progress Notes (Signed)
Subjective:   Patient ID: Timothy Escobar, male   DOB: 82 y.o.   MRN: [id number]   HPI Patient presents concerned about some discoloration of the dorsal of both feet and into the digits and has tried some steroid cream but they are not itchy   ROS      Objective:  Physical Exam  Neurovascular status intact patient found to have red blotchy spots dorsum foot and into the lesser digits bilateral localized no proximal edema erythema noted     Assessment:  Probability for a dermatological reaction with the possibility for fungal infiltration     Plan:  H&P reviewed and I have recommended Lamisil cream possibility for oral or if it gets worse to see a dermatologist.  Hopefully this will solve the problem

## 2023-05-06 NOTE — Progress Notes (Signed)
Subjective:  Patient ID: Timothy Escobar, male    DOB: May 28, 1941  Age: 82 y.o. MRN: [id number]  CC:  Chief Complaint  Patient presents with   Medical Management of Chronic Issues    Pt doing well, fasting     HPI Timothy Escobar presents for follow-up.  Annual wellness exam in March. No recent health changes. Doing well otherwise. Some ups and downs with spouse's health.  Still singing.   Hyperlipidemia: With target LDL less than 70 with history of CAD, STEMI in 2017 with DES, Treated with atorvastatin 80 mg daily. No new myalgias/side effects.  Cardiologist Dr. Tresa Endo.  Last appointment October 2023, continued on Brilinta and aspirin.  Echo last year with aortic sclerosis without stenosis.  1 year follow-up planned - appt. in November. No chest pain, no new bleeding.  Lab Results  Component Value Date   CHOL 117 07/19/2022   HDL 60 07/19/2022   LDLCALC 46 07/19/2022   TRIG 47 07/19/2022   CHOLHDL 2.0 07/19/2022   Lab Results  Component Value Date   ALT 18 07/19/2022   AST 21 07/19/2022   ALKPHOS 83 07/19/2022   BILITOT 0.8 07/19/2022   Hypertension: Amlodipine 5 mg every day. No new side effects.  Home readings: 130/70.  BP Readings from Last 3 Encounters:  05/07/23 122/66  12/27/22 124/60  12/07/22 122/68   Lab Results  Component Value Date   CREATININE 0.91 07/19/2022   Depression: Longstanding treatment with fluoxetine.  Trial off SSRI earlier this year as stable and some concern for sexual side effects.  Unfortunately he did notice change in mood and decided to return to his 20 mg daily dosing in February.  Sildenafil 50 mg, low-dose as option for erectile dysfunction, cautions discussed with cardiology with avoidance of combination with nitroglycerin, which has not been needed.  Increased stressors with spouse's health this year. Still taking fluoxetine 20 mg daily.  Took Viagra 50mg  (1/2 ineffective), some improvement, no HA, flushing, CP, hearing or vision  changes. Aware of contraindication to NTG with use. Delay in ejaculation. Declines any changes for now.      05/07/2023   10:16 AM 01/18/2023    8:17 AM 11/10/2022    9:17 AM 09/04/2022   11:04 AM 05/10/2022    1:40 PM  Depression screen PHQ 2/9  Decreased Interest 0 0 0 0 0  Down, Depressed, Hopeless 0 0 0 1 1  PHQ - 2 Score 0 0 0 1 1  Altered sleeping 0 0 0 1 1  Tired, decreased energy 0 0 0 1 0  Change in appetite 0 0 0 0 0  Feeling bad or failure about yourself  0 0 0 1 0  Trouble concentrating 0 0 0 0 0  Moving slowly or fidgety/restless 0 0 0 0 0  Suicidal thoughts 0 0 0 0 0  PHQ-9 Score 0 0 0 4 2  Difficult doing work/chores  Not difficult at all       History Patient Active Problem List   Diagnosis Date Noted   Sinus bradycardia 01/23/2017   Dyslipidemia    CAD (coronary artery disease)    Status post coronary artery stent placement    Bradycardia    Hypokalemia    Hyperlipidemia with target LDL less than 70    STEMI (ST elevation myocardial infarction) (HCC) 08/22/2016   Acute ST elevation myocardial infarction (STEMI) involving left anterior descending (LAD) coronary artery (HCC) 08/22/2016   Hip pain 09/28/2012  Chronic depression 09/28/2012   Past Medical History:  Diagnosis Date   Blood transfusion without reported diagnosis    CAD (coronary artery disease)    a. 08/22/2016 Ant STEMI/PCI: LM nl, LAD 30ost, 100p (3.0x15 Synergy DES & 2.75x24 Synergy DES), LCX large/dominant/nl, RCA nondom, nl;  b. 08/2016 Echo: Ef 60-65%, no rwma.   Cataract    Phreesia 03/29/2020   Depression    Dyslipidemia    Heart murmur    Phreesia 03/29/2020   Myocardial infarction [facility])    Phreesia 03/29/2020   Skin cancer    skin cancer   Past Surgical History:  Procedure Laterality Date   APPENDECTOMY     CARDIAC CATHETERIZATION N/A 08/22/2016   Procedure: Left Heart Cath and Coronary Angiography;  Surgeon: Lennette Bihari, MD;  Location: [facility] INVASIVE CV LAB;  Service:  Cardiovascular;  Laterality: N/A;   CARDIAC CATHETERIZATION N/A 08/22/2016   Procedure: Coronary Stent Intervention;  Surgeon: Lennette Bihari, MD;  Location: [facility] INVASIVE CV LAB;  Service: Cardiovascular;  Laterality: N/A;  MID LAD   COLON SURGERY N/A    Phreesia 03/29/2020   HERNIA REPAIR     parotid gland cyst removal Right 2012   vocal fold polyp removed     No Known Allergies Prior to Admission medications   Medication Sig Start Date End Date Taking? Authorizing Provider  amLODipine (NORVASC) 5 MG tablet TAKE 1/2 (ONE-HALF) TABLET BY MOUTH DAILY FOR A WEEK, IF SYSTOLIC ABOVE 140 INCREASE TO 1 TABLET 04/16/23   Lennette Bihari, MD  aspirin EC 81 MG EC tablet Take 1 tablet (81 mg total) by mouth daily. 08/26/16   Valentino Nose, MD  atorvastatin (LIPITOR) 80 MG tablet TAKE 1 TABLET BY MOUTH IN THE EVENING AT  6PM 09/08/22   Lennette Bihari, MD  betamethasone valerate ointment (VALISONE) 0.1 % Apply 1 Application topically 2 (two) times daily. As needed to hand rash. 11/10/22   Shade Flood, MD  BRILINTA 60 MG TABS tablet Take 1 tablet by mouth twice daily 02/13/23   Lennette Bihari, MD  calcium-vitamin D (OSCAL WITH D) 500-200 MG-UNIT per tablet Take 1 tablet by mouth daily. 1,000 of Vit D3    [provider]  cholecalciferol (VITAMIN D) 1000 units tablet Take 1,000 Units by mouth daily.    [provider]  clobetasol cream (TEMOVATE) 0.05 % Apply 1 Application topically 2 (two) times daily.    [provider]  FLUoxetine (PROZAC) 20 MG capsule  11/27/22   [provider]  Ginger Root POWD by Does not apply route. Mixes 1/2 teaspoon of powder into hot tea once daily    [provider]  Multiple Vitamins-Minerals (MULTIVITAMIN WITH MINERALS) tablet Take 1 tablet by mouth once a week. Centrum Silver    [provider]  nitroGLYCERIN (NITROSTAT) 0.4 MG SL tablet Place 1 tablet (0.4 mg total) under the tongue every 5 (five) minutes x 3 doses  as needed for chest pain. 03/03/20   Lennette Bihari, MD  sildenafil (VIAGRA) 50 MG tablet Take 0.5-1 tablets (25-50 mg total) by mouth daily as needed for erectile dysfunction. 12/27/22   Shade Flood, MD  vitamin C (ASCORBIC ACID) 500 MG tablet Take 500 mg by mouth daily.    [provider]   Social History   Socioeconomic History   Marital status: Married    Spouse name: Not on file   Number of children: 2   Years of education: college  Highest education level: Not on file  Occupational History   Occupation: Retired - Naval architect  Tobacco Use   Smoking status: Former    Current packs/day: 0.00    Types: Cigarettes    Quit date: 01/22/1967    Years since quitting: 56.3   Smokeless tobacco: Never  Vaping Use   Vaping status: Never Used  Substance and Sexual Activity   Alcohol use: Yes    Alcohol/week: 14.0 standard drinks of alcohol    Types: 14 Standard drinks or equivalent per week    Comment: wine: 2 glass daily   Drug use: Never   Sexual activity: Never  Other Topics Concern   Not on file  Social History Narrative   Lives w/ wife in [place]. Both are singers, they sing with the [employer] in [place] and with [employer], plus the church choir.   Exercise walk/gardening daily for 1 hr plus   Social Determinants of Health   Financial Resource Strain: Low Risk  (01/18/2023)   Overall Financial Resource Strain (CARDIA)    Difficulty of Paying Living Expenses: Not hard at all  Food Insecurity: No Food Insecurity (01/18/2023)   Hunger Vital Sign    Worried About Running Out of Food in the Last Year: Never true    Ran Out of Food in the Last Year: Never true  Transportation Needs: No Transportation Needs (01/18/2023)   PRAPARE - Administrator, Civil Service (Medical): No    Lack of Transportation (Non-Medical): No  Physical Activity: Insufficiently Active (01/18/2023)   Exercise Vital Sign    Days of Exercise per Week: 4 days     Minutes of Exercise per Session: 30 min  Stress: No Stress Concern Present (01/18/2023)   [employer] of Occupational Health - Occupational Stress Questionnaire    Feeling of Stress : Not at all  Social Connections: Socially Integrated (01/18/2023)   Social Connection and Isolation Panel [NHANES]    Frequency of Communication with Friends and Family: More than three times a week    Frequency of Social Gatherings with Friends and Family: Twice a week    Attends Religious Services: More than 4 times per year    Active Member of [employer] or Organizations: Yes    Attends Engineer, structural: More than 4 times per year    Marital Status: Married  Catering manager Violence: Not At Risk (01/18/2023)   Humiliation, Afraid, Rape, and Kick questionnaire    Fear of Current or Ex-Partner: No    Emotionally Abused: No    Physically Abused: No    Sexually Abused: No    Review of Systems  Constitutional:  Negative for fatigue and unexpected weight change.  Eyes:  Negative for visual disturbance.  Respiratory:  Negative for cough, chest tightness and shortness of breath.   Cardiovascular:  Negative for chest pain, palpitations and leg swelling.  Gastrointestinal:  Negative for abdominal pain and blood in stool.  Neurological:  Negative for dizziness, light-headedness and headaches.     Objective:   Vitals:   05/07/23 1014  BP: 122/66  Pulse: (!) 54  Temp: 98.1 F (36.7 C)  TempSrc: Temporal  SpO2: 97%  Weight: 165 lb 6.4 oz (75 kg)  Height: 5\' 8"  (1.727 m)     Physical Exam Vitals reviewed.  Constitutional:      Appearance: He is well-developed.  HENT:     Head: Normocephalic and atraumatic.  Neck:     Vascular: No carotid  bruit or JVD.  Cardiovascular:     Rate and Rhythm: Normal rate and regular rhythm.     Heart sounds: Normal heart sounds. No murmur heard. Pulmonary:     Effort: Pulmonary effort is normal.     Breath sounds: Normal breath sounds. No rales.   Musculoskeletal:     Right lower leg: No edema.     Left lower leg: No edema.  Skin:    General: Skin is warm and dry.  Neurological:     Mental Status: He is alert and oriented to person, place, and time.  Psychiatric:        Mood and Affect: Mood normal.        Assessment & Plan:  Timothy Escobar is a 82 y.o. male . Hyperlipidemia with target LDL less than 70 - Plan: Comprehensive metabolic panel, Lipid panel Coronary artery disease involving native coronary artery of native heart without angina pectoris - Plan: Comprehensive metabolic panel, Lipid panel  -Asymptomatic, tolerating Brilinta, aspirin, high-dose statin.  Appointment next few months with cardiology.  Some easy bruising with use of meds but denies any new bleeding.  Check updated CBC.  Continue atorvastatin same dose.  Aware of contraindication with nitroglycerin and sildenafil if needed.  ER precautions given.  Depression, unspecified depression type  -Stable with fluoxetine.  Prolonged time to ejaculation likely due to SSRI side effect.  Option of half dosing or occasional missed dose as option given the long-acting nature of fluoxetine.  RTC precautions, 21-month follow-up.   Erectile dysfunction, unspecified erectile dysfunction type - Plan: sildenafil (VIAGRA) 100 MG tablet  - viagra Rx given - use lowest effective dose. Side effects discussed (including but not limited to headache/flushing, blue discoloration of vision, possible vascular steal and risk of cardiac effects if underlying unknown coronary artery disease, and permanent sensorineural hearing loss). Understanding expressed.   Meds ordered this encounter  Medications   sildenafil (VIAGRA) 100 MG tablet    Sig: Take 0.5-1 tablets (50-100 mg total) by mouth daily as needed for erectile dysfunction.    Dispense:  10 tablet    Refill:  5   Patient Instructions  I ordered the higher dose of Viagra - 100mg . Try taking 1/2 pill up to full pill if needed.   Lower dose prozac (1/2 pill or skipping occasional day) may possibly lessen some of the other symptoms as we discussed.  No other med changes. Thanks for coming in today and take care!    Signed,   Meredith Staggers, MD [facility], [facility] 05/07/23 10:57 AM

## 2023-05-07 ENCOUNTER — Encounter: Payer: Self-pay | Admitting: Family Medicine

## 2023-05-07 ENCOUNTER — Ambulatory Visit: Payer: Medicare Other | Admitting: Family Medicine

## 2023-05-07 VITALS — BP 122/66 | HR 54 | Temp 98.1°F | Ht 68.0 in | Wt 165.4 lb

## 2023-05-07 DIAGNOSIS — R233 Spontaneous ecchymoses: Secondary | ICD-10-CM | POA: Diagnosis not present

## 2023-05-07 DIAGNOSIS — E785 Hyperlipidemia, unspecified: Secondary | ICD-10-CM | POA: Diagnosis not present

## 2023-05-07 DIAGNOSIS — F32A Depression, unspecified: Secondary | ICD-10-CM

## 2023-05-07 DIAGNOSIS — I2510 Atherosclerotic heart disease of native coronary artery without angina pectoris: Secondary | ICD-10-CM

## 2023-05-07 DIAGNOSIS — N529 Male erectile dysfunction, unspecified: Secondary | ICD-10-CM

## 2023-05-07 LAB — LIPID PANEL
Cholesterol: 127 mg/dL (ref 0–200)
HDL: 64 mg/dL (ref >39.00)
LDL Cholesterol: 52 mg/dL (ref 0–99)
NonHDL: 62.52
Total CHOL/HDL Ratio: 2
Triglycerides: 51 mg/dL (ref 0.0–149.0)
VLDL: 10.2 mg/dL (ref 0.0–40.0)

## 2023-05-07 LAB — CBC
HCT: 43.7 % (ref 39.0–52.0)
Hemoglobin: 14.6 g/dL (ref 13.0–17.0)
MCHC: 33.5 g/dL (ref 30.0–36.0)
MCV: 98 fl (ref 78.0–100.0)
Platelets: 174 10*3/uL (ref 150.0–400.0)
RBC: 4.46 Mil/uL (ref 4.22–5.81)
RDW: 13 % (ref 11.5–15.5)
WBC: 3.8 10*3/uL — ABNORMAL LOW (ref 4.0–10.5)

## 2023-05-07 LAB — COMPREHENSIVE METABOLIC PANEL
ALT: 20 U/L (ref 0–53)
AST: 23 U/L (ref 0–37)
Albumin: 4.3 g/dL (ref 3.5–5.2)
Alkaline Phosphatase: 71 U/L (ref 39–117)
BUN: 15 mg/dL (ref 6–23)
CO2: 29 mEq/L (ref 19–32)
Calcium: 9.4 mg/dL (ref 8.4–10.5)
Chloride: 106 mEq/L (ref 96–112)
Creatinine, Ser: 1 mg/dL (ref 0.40–1.50)
GFR: 71.13 mL/min (ref >60.00)
Glucose, Bld: 102 mg/dL — ABNORMAL HIGH (ref 70–99)
Potassium: 4.6 mEq/L (ref 3.5–5.1)
Sodium: 140 mEq/L (ref 135–145)
Total Bilirubin: 1.2 mg/dL (ref 0.2–1.2)
Total Protein: 6.7 g/dL (ref 6.0–8.3)

## 2023-05-07 MED ORDER — SILDENAFIL CITRATE 100 MG PO TABS
50.0000 mg | ORAL_TABLET | Freq: Every day | ORAL | 5 refills | Status: DC | PRN
Start: 2023-05-07 — End: 2023-08-28

## 2023-05-07 NOTE — Patient Instructions (Signed)
I ordered the higher dose of Viagra - 100mg . Try taking 1/2 pill up to full pill if needed.  Lower dose prozac (1/2 pill or skipping occasional day) may possibly lessen some of the other symptoms as we discussed.  No other med changes. Thanks for coming in today and take care!

## 2023-05-08 ENCOUNTER — Other Ambulatory Visit: Payer: Self-pay | Admitting: Cardiovascular Disease

## 2023-06-04 ENCOUNTER — Other Ambulatory Visit: Payer: Self-pay | Admitting: Family Medicine

## 2023-07-09 DIAGNOSIS — L814 Other melanin hyperpigmentation: Secondary | ICD-10-CM | POA: Diagnosis not present

## 2023-07-09 DIAGNOSIS — L821 Other seborrheic keratosis: Secondary | ICD-10-CM | POA: Diagnosis not present

## 2023-07-09 DIAGNOSIS — L853 Xerosis cutis: Secondary | ICD-10-CM | POA: Diagnosis not present

## 2023-07-09 DIAGNOSIS — L570 Actinic keratosis: Secondary | ICD-10-CM | POA: Diagnosis not present

## 2023-07-09 DIAGNOSIS — L281 Prurigo nodularis: Secondary | ICD-10-CM | POA: Diagnosis not present

## 2023-07-09 DIAGNOSIS — Z85828 Personal history of other malignant neoplasm of skin: Secondary | ICD-10-CM | POA: Diagnosis not present

## 2023-07-09 DIAGNOSIS — Z08 Encounter for follow-up examination after completed treatment for malignant neoplasm: Secondary | ICD-10-CM | POA: Diagnosis not present

## 2023-07-09 DIAGNOSIS — D225 Melanocytic nevi of trunk: Secondary | ICD-10-CM | POA: Diagnosis not present

## 2023-07-09 DIAGNOSIS — L298 Other pruritus: Secondary | ICD-10-CM | POA: Diagnosis not present

## 2023-08-28 ENCOUNTER — Ambulatory Visit: Payer: Medicare Other | Attending: Cardiovascular Disease | Admitting: Cardiovascular Disease

## 2023-08-28 ENCOUNTER — Encounter: Payer: Self-pay | Admitting: Cardiovascular Disease

## 2023-08-28 VITALS — BP 128/64 | HR 61 | Ht 68.0 in | Wt 171.0 lb

## 2023-08-28 DIAGNOSIS — I700 Atherosclerosis of aorta: Secondary | ICD-10-CM | POA: Diagnosis not present

## 2023-08-28 DIAGNOSIS — I10 Essential (primary) hypertension: Secondary | ICD-10-CM | POA: Diagnosis not present

## 2023-08-28 DIAGNOSIS — I358 Other nonrheumatic aortic valve disorders: Secondary | ICD-10-CM

## 2023-08-28 DIAGNOSIS — R001 Bradycardia, unspecified: Secondary | ICD-10-CM

## 2023-08-28 DIAGNOSIS — I25118 Atherosclerotic heart disease of native coronary artery with other forms of angina pectoris: Secondary | ICD-10-CM

## 2023-08-28 NOTE — Patient Instructions (Signed)
Medication Instructions:   No changes  *If you need a refill on your cardiac medications before your next appointment, please call your pharmacy*   Lab Work: Not needed    Testing/Procedures:  Will be schedule at [facility] [address]-- April 2025 Your physician has requested that you have an echocardiogram. Echocardiography is a painless test that uses sound waves to create images of your heart. It provides your doctor with information about the size and shape of your heart and how well your heart's chambers and valves are working. This procedure takes approximately one hour. There are no restrictions for this procedure. Please do NOT wear cologne, perfume, aftershave, or lotions (deodorant is allowed). Please arrive 15 minutes prior to your appointment time.  Please note: We ask at that you not bring children with you during ultrasound (echo/ vascular) testing. Due to room size and safety concerns, children are not allowed in the ultrasound rooms during exams. Our front office staff cannot provide observation of children in our lobby area while testing is being conducted. An adult accompanying a patient to their appointment will only be allowed in the ultrasound room at the discretion of the ultrasound technician under special circumstances. We apologize for any inconvenience.    Follow-Up: At [facility], you and your health needs are our priority.  As part of our continuing mission to provide you with exceptional heart care, we have created designated Provider Care Teams.  These Care Teams include your primary Cardiologist (physician) and Advanced Practice Providers (APPs -  Physician Assistants and Nurse Practitioners) who all work together to provide you with the care you need, when you need it.     Your next appointment:   6 month(s)  The format for your next appointment:   In Person  Provider:   Nicki Guadalajara, MD

## 2023-08-31 DIAGNOSIS — H43812 Vitreous degeneration, left eye: Secondary | ICD-10-CM | POA: Diagnosis not present

## 2023-09-02 ENCOUNTER — Encounter: Payer: Self-pay | Admitting: Cardiovascular Disease

## 2023-09-02 NOTE — Progress Notes (Signed)
Cardiology Office Note    Date:  09/02/2023   ID:  Shafer, [place] 25-Jun-1941, MRN [id number]  PCP:  Shade Flood, MD  Cardiologist:  Nicki Guadalajara, MD   1 year follow-up evaluation   History of Present Illness:  Timothy Escobar is a 82 y.o. male who sufferred an Anterior STEMI on 08/22/16.  I last saw him in October 2023.  He presents for a one year follow-up evaluation.   Mr. Bille Pallone was without cardiac history and developed acute onset of severe substernal chest pressure while he was moving logs in a wheelbarrow which commenced at approximately 5 PM on 08/22/16. EMS arrived within 15 minutes. His ECG in transient showed 11 mm of ST segment elevation in leads V3 through V6 consistent with an acute anterior injury current. A code STEMI was activated and he was brought emergently directly to the catheterization laboratory where I performed emergent catheterization. Catheterization revealed total occlusion of a large proximal LAD with TIMI 0 flow  and a large dominant normal left circumflex vessel and small nondominant RCA. He underwent successful PCI to the LAD, treated with PTCA/DES stenting at the site of 100% occlusion with initial insertion of a 3.016 mm Synergy DES stent.  Once  TIMI-3 flow was established it was evident that there was segmental disease beyond the stented segment of 70 and 80% and tandem stenting with a 2.7524 mm Synergy DES stent was done with post stent dilatation from 3.23 mm in the proximal portion of the proximal stent to 3.05 mm at the distal portion of the distal stent. LVEDP was 13 mm Hg and the door to balloon time from arrival was 19 minutes. DTB from chest pain onset prior to EMS arrival was < 1 hour.  He ultimately completely resolved his 11 mm ST elevation, ECG normalized, and subsequent echo showed completely normal LV fx with EF 60-65% without any wall motion abnormality! He was discharged 2 days later.  Since his event he has remained  asymptomatic.  He completed participation in cardiac rehabilitation.  He is participating in cardiac rehab.  When I last saw him, he was remaining completely asymptomatic.  Over the past 6 months.  He continues to be asymptomatic.  He is active.  He splits wood.  He continues to sing in the [facility] [employer] choir.  Laboratory on October 03, 2017 showed a total cholesterol 119, HDL 60, LDL 49, triglycerides 51.  Hemoglobin A1c was 5.5.  Creatine 0.88.  TSH 1.05.  Normal LFTs.   I saw him in July 2019 at which time he was continuing to be asymptomatic from a cardiovascular standpoint.  He remains active cutting his grass, cutting firewood, and walking.  He continues to sing in the [employer] choir.  H underwent  an echo Doppler study on Mar 05, 2018.  EF was 60 to 65% and he had normal diastolic parameters.  There was trivial AR, mild MR, trivial TR, and mild PR.    He underwent evaluation by Dr. Karyl Kinnier for right upper quadrant abdominal stabbing pain.  CT of his abdomen on July 19, 2018 revealed evidence for very mild fatty infiltration of the liver.  His pancreas was unremarkable.  He was found to have a small accessory spleen.  There was mild tension of the right renal collecting system of uncertain significance.  There was no stone or obstructing mass.  Aortic atherosclerosis was present.  He did not have any  adenopathy.  He continues to be very active.  He continued to sing for the [facility] choir.  Recent laboratory on October 28, 2018 continue to show excellent lipid studies with total cholesterol 125, HDL 65, LDL 49, and triglycerides 53.  He denied chest pain, PND, orthopnea or  Palpitations.  When I saw him in November 2020 he continued to do exceptionally well.   Unfortunately with the COVID-19 pandemic, he has been unable to perform.  However he is practicing singing daily.  He remained active on his property and denied any exertional shortness  of breath or chest pain.  His blood pressure at home typically runs in the 120s over 70s.  He was walking at least 2 miles 4 days/week.  He has not had recent laboratory.    I  saw him on July 01, 2020 and over the past year he had remained completely asymptomatic.  He continued  to be very active working on his property.  This past week he actually took down a small tree.  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  He denies chest pain PND orthopnea, palpitations, or any myalgias.  He just had lab work done several days ago prior to this office visit on June 24, 2020 which is excellent.  Creatinine 1.0.  Glucose 95.  LFTs normal.  Hemoglobin 14.1, Magick 42.4.  TSH normal 1.47.  Lipid studies excellent with total cholesterol 130, HDL 61, LDL 59 on atorvastatin 80 mg.  He continues to be on aspirin and low-dose Brilinta per Pegas[employer] trial data.  He is on low-dose lisinopril 2.5 mg.    Since I last saw him, he was evaluated by his primary physician Dr. Karyl Kinnier.  He has been on Prozac for depression which has been well controlled and his prescription was renewed.  I saw him on August 19, 2021.  He continues to be stable from a cardiovascular standpoint and denied any chest pain or shortness of breath.  He continues to be active singing with the [employer] in [place] and is previously also had some for the [employer] corral plus [employer].  He has learned some additional baritone/base solo contados from [place] [phone]) and has accomplished other additional difficult pieces.  He remains active working in his yard.  He denies chest pain or palpitations.  He denies any exertional dyspnea.  He continues to be on lisinopril 2.5 mg daily, atorvastatin 80 mg, and DAPT with aspirin and low-dose Brilinta at 60 mg twice daily.  He continues to be on fluoxetine 20 mg daily.    I last saw him on July 25, 2022 at which time he remained  entirely asymptomatic.  He  recently downsized his house and now is living in a smaller 1400 square foot house on a single level.  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  He practices every day.  He denies chest pain PND or.  He denies change in exercise tolerance.  He recently saw Dr. Karyl Kinnier in July 2023.  Laboratory on July 19, 2022 showed total cholesterol 117 HDL 60 LDL and triglycerides 47 on atorvastatin 80 mg.  He continues to be on aspirin and low-dose Brilinta 60 mg twice a day.  He is on amlodipine 5 mg hypertension and fluoxetine per Dr. Neva Seat.  He underwent an echo Doppler study yesterday July 24, 2002 which showed an EF 60 to 65%, mild LVH, and he had normal diastolic parameters.  There was mild MR and mild aortic sclerosis without stenosis with mild aortic regurgitation.    Since I last saw him, Mr. Foltyn has continued to do exceptionally well.  He continues to sing regularly and is practicing numerous pieces by Sharilyn Sites.  He remains active.  He has been caring for his wife more recently since she has developed chronic inflammatory demyelinating polyneuropathy.  He continues to be on amlodipine 2.5 mg, for blood pressure.  He has been maintained on low-dose aspirin and Brilinta at 60 mg twice a day for DAPT.  He continues to be on atorvastatin 80 mg daily for hyperlipidemia.  Laboratory on May 07, 2023 showed total cholesterol 127, HDL 64, LDL 52, and triglycerides at 51.  Creatinine is stable at 1.0 with potassium 4.6.  He takes fluoxetine 20 mg daily.  He presents for yearly evaluation.  Past Medical History:  Diagnosis Date   Blood transfusion without reported diagnosis    CAD (coronary artery disease)    a. 08/22/2016 Ant STEMI/PCI: LM nl, LAD 30ost, 100p (3.0x15 Synergy DES & 2.75x24 Synergy DES), LCX large/dominant/nl, RCA nondom, nl;  b. 08/2016 Echo: Ef 60-65%, no rwma.    Cataract    Phreesia 03/29/2020   Depression    Dyslipidemia    Heart murmur    Phreesia 03/29/2020   Myocardial infarction [facility])    Phreesia 03/29/2020   Skin cancer    skin cancer    Past Surgical History:  Procedure Laterality Date   APPENDECTOMY     CARDIAC CATHETERIZATION N/A 08/22/2016   Procedure: Left Heart Cath and Coronary Angiography;  Surgeon: Lennette Bihari, MD;  Location: [facility] INVASIVE CV LAB;  Service: Cardiovascular;  Laterality: N/A;   CARDIAC CATHETERIZATION N/A 08/22/2016   Procedure: Coronary Stent Intervention;  Surgeon: Lennette Bihari, MD;  Location: [facility] INVASIVE CV LAB;  Service: Cardiovascular;  Laterality: N/A;  MID LAD   COLON SURGERY N/A    Phreesia 03/29/2020   HERNIA REPAIR     parotid gland cyst removal Right 2012   vocal fold polyp removed      Current Medications: Outpatient Medications Prior to Visit  Medication Sig Dispense Refill   amLODipine (NORVASC) 5 MG tablet TAKE 1/2 (ONE-HALF) TABLET BY MOUTH DAILY FOR A WEEK, IF SYSTOLIC ABOVE 140 INCREASE TO 1 TABLET 180 tablet 0   aspirin EC 81 MG EC tablet Take 1 tablet (81 mg total) by mouth daily. 90 tablet 2   atorvastatin (LIPITOR) 80 MG tablet TAKE 1 TABLET BY MOUTH IN THE EVENING AT  6PM 90 tablet 3   calcium-vitamin D (OSCAL WITH D) 500-200 MG-UNIT per tablet Take 1 tablet by mouth daily. 1,000 of Vit D3     cholecalciferol (VITAMIN D) 1000 units tablet Take 1,000 Units by mouth daily.     clobetasol cream (TEMOVATE) 0.05 % Apply 1 Application topically 2 (two) times daily.     FLUoxetine (PROZAC) 20 MG capsule Take 1 capsule by mouth once daily 90 capsule 0   Ginger Root POWD by Does not apply route. Mixes 1/2 teaspoon of powder into hot tea once daily     Multiple Vitamins-Minerals (MULTIVITAMIN WITH MINERALS) tablet Take 1 tablet by mouth once a week. Centrum Silver     nitroGLYCERIN (NITROSTAT) 0.4 MG SL tablet Place 1 tablet (0.4 mg total) under the tongue every 5 (five) minutes x 3 doses  as  needed for chest pain. 25 tablet 3   ticagrelor (BRILINTA) 60 MG TABS tablet Take 1 tablet by mouth twice daily 180 tablet 3   vitamin C (ASCORBIC ACID) 500 MG tablet Take 500 mg by mouth daily.     sildenafil (VIAGRA) 100 MG tablet Take 0.5-1 tablets (50-100 mg total) by mouth daily as needed for erectile dysfunction. 10 tablet 5   No facility-administered medications prior to visit.     Allergies:   Patient has no known allergies.   Social History   Socioeconomic History   Marital status: Married    Spouse name: Not on file   Number of children: 2   Years of education: college   Highest education level: Not on file  Occupational History   Occupation: Retired - Naval architect  Tobacco Use   Smoking status: Former    Current packs/day: 0.00    Types: Cigarettes    Quit date: 01/22/1967    Years since quitting: 56.6   Smokeless tobacco: Never  Vaping Use   Vaping status: Never Used  Substance and Sexual Activity   Alcohol use: Yes    Alcohol/week: 14.0 standard drinks of alcohol    Types: 14 Standard drinks or equivalent per week    Comment: wine: 2 glass daily   Drug use: Never   Sexual activity: Never  Other Topics Concern   Not on file  Social History Narrative   Lives w/ wife in [place]. Both are singers, they sing with the [employer] in [place] and with [employer], plus the church choir.   Exercise walk/gardening daily for 1 hr plus   Social Determinants of Health   Financial Resource Strain: Low Risk  (01/18/2023)   Overall Financial Resource Strain (CARDIA)    Difficulty of Paying Living Expenses: Not hard at all  Food Insecurity: No Food Insecurity (01/18/2023)   Hunger Vital Sign    Worried About Running Out of Food in the Last Year: Never true    Ran Out of Food in the Last Year: Never true  Transportation Needs: No Transportation Needs (01/18/2023)   PRAPARE - Administrator, Civil Service (Medical): No    Lack of Transportation  (Non-Medical): No  Physical Activity: Insufficiently Active (01/18/2023)   Exercise Vital Sign    Days of Exercise per Week: 4 days    Minutes of Exercise per Session: 30 min  Stress: No Stress Concern Present (01/18/2023)   [employer] of Occupational Health - Occupational Stress Questionnaire    Feeling of Stress : Not at all  Social Connections: Socially Integrated (01/18/2023)   Social Connection and Isolation Panel [NHANES]    Frequency of Communication with Friends and Family: More than three times a week    Frequency of Social Gatherings with Friends and Family: Twice a week    Attends Religious Services: More than 4 times per year    Active Member of [employer] or Organizations: Yes    Attends Engineer, structural: More than 4 times per year    Marital Status: Married     Family History:  The patient's family history includes Cancer in his brother, brother, father, paternal grandmother, sister, and sister; Dementia in his mother; Heart disease in his brother, maternal grandmother, and sister; Hypertension in his sister; Stroke in his maternal grandmother.   ROS General: Negative; No fevers, chills, or night sweats;  HEENT: Negative; No changes in vision or hearing, sinus congestion, difficulty swallowing Pulmonary:  Negative; No cough, wheezing, shortness of breath, hemoptysis Cardiovascular: Negative; No chest pain, presyncope, syncope, palpitations GI: resolved mild right upper quadrant tenderness. GU: Negative; No dysuria, hematuria, or difficulty voiding Musculoskeletal: Negative; no myalgias, joint pain, or weakness Hematologic/Oncology: Negative; no easy bruising, bleeding Endocrine: Negative; no heat/cold intolerance; no diabetes Neuro: Negative; no changes in balance, headaches Skin: Negative; No rashes or skin lesions Psychiatric: Negative; No behavioral problems, depression Sleep: Negative; No snoring, daytime sleepiness, hypersomnolence, bruxism, restless  legs, hypnogognic hallucinations, no cataplexy Other comprehensive 14 point system review is negative.   PHYSICAL EXAM:   VS:  BP 128/64 (BP Location: Left Arm, Patient Position: Sitting, Cuff Size: Normal)   Pulse 61   Ht 5\' 8"  (1.727 m)   Wt 171 lb (77.6 kg)   BMI 26.00 kg/m    Repeat blood pressure by me was 124/66  Wt Readings from Last 3 Encounters:  08/28/23 171 lb (77.6 kg)  05/07/23 165 lb 6.4 oz (75 kg)  12/27/22 169 lb 6.4 oz (76.8 kg)    General: Alert, oriented, no distress.  Skin: normal turgor, no rashes, warm and dry HEENT: Normocephalic, atraumatic. Pupils equal round and reactive to light; sclera anicteric; extraocular muscles intact;  Nose without nasal septal hypertrophy Mouth/Parynx benign; Mallinpatti scale 3 Neck: No JVD, no carotid bruits; normal carotid upstroke Lungs: clear to ausculatation and percussion; no wheezing or rales Chest wall: without tenderness to palpitation Heart: PMI not displaced, RRR, s1 s2 normal, 1/6 systolic murmur, no diastolic murmur, no rubs, gallops, thrills, or heaves Abdomen: soft, nontender; no hepatosplenomehaly, BS+; abdominal aorta nontender and not dilated by palpation. Back: no CVA tenderness Pulses 2+ Musculoskeletal: full range of motion, normal strength, no joint deformities Extremities: no clubbing cyanosis or edema, Homan's sign negative  Neurologic: grossly nonfocal; Cranial nerves grossly wnl Psychologic: Normal mood and affect   Studies/Labs Reviewed:   EKG Interpretation Date/Time:  Tuesday August 28 2023 10:38:57 EST Ventricular Rate:  61 PR Interval:  208 QRS Duration:  94 QT Interval:  414 QTC Calculation: 416 R Axis:   84  Text Interpretation: Normal sinus rhythm Normal ECG When compared with ECG of 15-Feb-2017 15:11, No significant change was found Confirmed by Nicki Guadalajara ([id number]) on 09/02/2023 9:21:06 AM    July 25, 2022 ECG (independently read by me): Sinus bradycardia 56 bpm.  No ST  segment changes.  August 19, 2021 ECG (independently read by me):  NSR at 60, No ST changes, PR 200 msec, no ectopy  July 01, 2020 ECG (independently read by me):   NSR at 62; No ectopy, normal intervals  November 2020 EKG:  EKG is ordered today. Sinus Bradycardia at 52; Mild early repolarization changes; PR 204 msec  November 04, 2018 ECG (independently read by me): Sinus bradycardia at 57 bpm.  Normal ECG.  No ectopy.  No ST segment changes.  PR interval 206 msec;QTc 412  July 2019 ECG (independently read by me): Sinius bradycardia 59 bpm.  No ST segment changes.  No ECG evidence for prior ACS.  December 2018 ECG (independently read by me): Normal sinus rhythm at 61 bpm.  No ST segment changes.  No ectopy.  Normal intervals.  June 2018 ECG (independently read by me): Sinus bradycardia 51 bpm.  No ST segment changes.  Normal intervals.  February 2018 ECG (independently read by me): Sinus bradycardia 53 bpm.  Normal ECG.  Early repolarization.  No evidence for prior infarction.  Recent Labs:    Latest Ref Rng &  Units 05/07/2023   11:06 AM 07/19/2022    8:48 AM 10/27/2021   11:00 AM  BMP  Glucose 70 - 99 mg/dL 161  92  86   BUN 6 - 23 mg/dL 15  16  16    Creatinine 0.40 - 1.50 mg/dL 0.96  0.45  4.09   BUN/Creat Ratio 10 - 24  18    Sodium 135 - 145 mEq/L 140  141  140   Potassium 3.5 - 5.1 mEq/L 4.6  4.4  4.0   Chloride 96 - 112 mEq/L 106  106  105   CO2 19 - 32 mEq/L 29  26  30    Calcium 8.4 - 10.5 mg/dL 9.4  8.9  8.7         Latest Ref Rng & Units 05/07/2023   11:06 AM 07/19/2022    8:48 AM 08/16/2021    8:56 AM  Hepatic Function  Total Protein 6.0 - 8.3 g/dL 6.7  6.0  6.1   Albumin 3.5 - 5.2 g/dL 4.3  4.3  4.2   AST 0 - 37 U/L 23  21  22    ALT 0 - 53 U/L 20  18  20    Alk Phosphatase 39 - 117 U/L 71  83  83   Total Bilirubin 0.2 - 1.2 mg/dL 1.2  0.8  0.8        Latest Ref Rng & Units 05/07/2023   11:06 AM 07/19/2022    8:48 AM 10/27/2021   11:00 AM  CBC  WBC 4.0 -  10.5 K/uL 3.8  4.5  4.5   Hemoglobin 13.0 - 17.0 g/dL 81.1  91.4  78.2   Hematocrit 39.0 - 52.0 % 43.7  41.6  43.2   Platelets 150.0 - 400.0 K/uL 174.0  157  158.0    Lab Results  Component Value Date   MCV 98.0 05/07/2023   MCV 97 07/19/2022   MCV 95.6 10/27/2021   Lab Results  Component Value Date   TSH 1.040 07/19/2022   Lab Results  Component Value Date   HGBA1C 5.5 08/23/2016     BNP    Component Value Date/Time   BNP 164.0 (H) 08/23/2016 0200    ProBNP No results found for: "PROBNP"   Lipid Panel     Component Value Date/Time   CHOL 127 05/07/2023 1106   CHOL 117 07/19/2022 0848   TRIG 51.0 05/07/2023 1106   HDL 64.00 05/07/2023 1106   HDL 60 07/19/2022 0848   CHOLHDL 2 05/07/2023 1106   VLDL 10.2 05/07/2023 1106   LDLCALC 52 05/07/2023 1106   LDLCALC 46 07/19/2022 0848     RADIOLOGY: No results found.   ASSESSMENT:    1. Coronary artery disease involving native coronary artery of native heart with other form of angina pectoris (HCC)   2. Sinus bradycardia   3. Essential hypertension   4. Aortic atherosclerosis (HCC)   5. Aortic valve sclerosis      PLAN:  Mr Berdugo is a very pleasant 41 -year-old Caucasian male who develeoped an ACS/Anterior STEMI with 11 mm ST elevation precordially on Halloween 2017. Fortunately he acted quickly and was prompty brought to [facility] cath lab where I performed emergent cath/PCI .  He had an DTB time of 19 minutes from arrival to the lab and his chest pain duration was less than 60 minutes. Due to disease beyond the initial occlusion he underwent tandem DES stenting with an excellent result.  He has been demonstrated to  have complete myocardial salvage and his ECG is normal.  His echo Doppler study in May 2019 continued to show an EF of 60 to 65% without wall motion abnormalities.  His aortic valve is moderately thickened and moderately calcified but excursion was normal.  He had trivial AR, mild MR, trivial TR and mild  PR.  There was increased thickness of the interatrial septum consistent with lipomatous hypertrophy.  There was mild/moderate LA dilation.  His medical case was interviewed on [place] on his birthday December 09, 2016.  His most recent echo from July 24, 2022 revealed normal LV function with mild LVH.  He had normal diastolic function.  There was mild aortic sclerosis, mild MR, and AR without aortic stenosis.  Presently, Mr. Tenorio remains completely asymptomatic.  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  Lipid studies from September 2023 were excellent with total cholesterol 117, HDL 60 and LDL 46 and triglycerides 47.  Most recent lipid studies from July 2024 showed total cholesterol 127, triglycerides 51, HDL 64, and LDL 52.  He has been caring for his wife who has with chronic inflammatory demyelinating polyneuropathy.  I discussed with him my plans for future retirement next year.  I will schedule him for a 4-month follow-up echo Doppler assessment in April 2025 and we will see him in follow-up in May.  I discussed with him my plans for future retirement with need for transition beyond that office visit.      Medication Adjustments/Labs and Tests Ordered: Current medicines are reviewed at length with the patient today.  Concerns regarding medicines are outlined above.  Medication changes, Labs and Tests ordered today are listed in the Patient Instructions below.  Patient Instructions  Medication Instructions:   No changes  *If you need a refill on your cardiac medications before your next appointment, please call your pharmacy*   Lab Work: Not needed    Testing/Procedures:  Will be schedule at [facility] [address]-- April 2025 Your physician has requested that you have an echocardiogram. Echocardiography is a painless test that uses sound waves to create images of your heart. It provides your doctor with  information about the size and shape of your heart and how well your heart's chambers and valves are working. This procedure takes approximately one hour. There are no restrictions for this procedure. Please do NOT wear cologne, perfume, aftershave, or lotions (deodorant is allowed). Please arrive 15 minutes prior to your appointment time.  Please note: We ask at that you not bring children with you during ultrasound (echo/ vascular) testing. Due to room size and safety concerns, children are not allowed in the ultrasound rooms during exams. Our front office staff cannot provide observation of children in our lobby area while testing is being conducted. An adult accompanying a patient to their appointment will only be allowed in the ultrasound room at the discretion of the ultrasound technician under special circumstances. We apologize for any inconvenience.    Follow-Up: At [facility], you and your health needs are our priority.  As part of our continuing mission to provide you with exceptional heart care, we have created designated Provider Care Teams.  These Care Teams include your primary Cardiologist (physician) and Advanced Practice Providers (APPs -  Physician Assistants and Nurse Practitioners) who all work together to provide you with the care you need, when you need it.     Your next appointment:  6 month(s)  The format for your next appointment:   In Person  Provider:   Nicki Guadalajara, MD      Signed, Nicki Guadalajara, MD , [facility] 09/02/2023 9:13 AM    [facility] [address], [place], [place]  [address] Phone: [phone]

## 2023-09-08 ENCOUNTER — Other Ambulatory Visit: Payer: Self-pay | Admitting: Family Medicine

## 2023-09-10 ENCOUNTER — Other Ambulatory Visit: Payer: Self-pay

## 2023-09-10 ENCOUNTER — Emergency Department (HOSPITAL_BASED_OUTPATIENT_CLINIC_OR_DEPARTMENT_OTHER): Payer: Medicare Other | Admitting: Radiology

## 2023-09-10 DIAGNOSIS — S61012A Laceration without foreign body of left thumb without damage to nail, initial encounter: Secondary | ICD-10-CM | POA: Insufficient documentation

## 2023-09-10 DIAGNOSIS — Z7982 Long term (current) use of aspirin: Secondary | ICD-10-CM | POA: Diagnosis not present

## 2023-09-10 DIAGNOSIS — W268XXA Contact with other sharp object(s), not elsewhere classified, initial encounter: Secondary | ICD-10-CM | POA: Diagnosis not present

## 2023-09-10 NOTE — ED Triage Notes (Signed)
Cut left thumb with box cutter. Flap laceration ~1.5 cm long on pad of thumb. Skin remains, edges approximate. Wrapped with clean gauze. Tetanus up to date.

## 2023-09-11 ENCOUNTER — Emergency Department (HOSPITAL_BASED_OUTPATIENT_CLINIC_OR_DEPARTMENT_OTHER)
Admission: EM | Admit: 2023-09-11 | Discharge: 2023-09-11 | Disposition: A | Discharge status: Home / Self Care | Payer: Medicare Other | Attending: Emergency Medicine | Admitting: Emergency Medicine

## 2023-09-11 DIAGNOSIS — S61012A Laceration without foreign body of left thumb without damage to nail, initial encounter: Secondary | ICD-10-CM

## 2023-09-11 NOTE — Discharge Instructions (Addendum)
Your laceration was approximated with Steri-Strips and Dermabond.  The Dermabond will fall off over time.  Watch for signs of wound infection.

## 2023-09-11 NOTE — ED Provider Notes (Signed)
[place] EMERGENCY DEPARTMENT AT [facility] Provider Note   CSN: [id number] Arrival date & time: 09/10/23  2143     History  Chief Complaint  Patient presents with   Laceration    L thumb    Timothy Escobar is a 82 y.o. male.   Laceration    82 year old male presenting to the emergency department with concern for linear laceration along the lateral aspect of his left thumb.  The patient states that he was cutting pipe with a box cutter and sustained a 1.5 cm laceration along the lateral aspect of the left thumb.  Laceration does not involve the nail.  His tetanus is up-to-date.  Wound is well-approximated and hemostatic on arrival.  Denies any other injuries or complaints.  Home Medications Prior to Admission medications   Medication Sig Start Date End Date Taking? Authorizing Provider  amLODipine (NORVASC) 5 MG tablet TAKE 1/2 (ONE-HALF) TABLET BY MOUTH DAILY FOR A WEEK, IF SYSTOLIC ABOVE 140 INCREASE TO 1 TABLET 04/16/23   Lennette Bihari, MD  aspirin EC 81 MG EC tablet Take 1 tablet (81 mg total) by mouth daily. 08/26/16   Valentino Nose, MD  atorvastatin (LIPITOR) 80 MG tablet TAKE 1 TABLET BY MOUTH IN THE EVENING AT  6PM 09/08/22   Lennette Bihari, MD  calcium-vitamin D (OSCAL WITH D) 500-200 MG-UNIT per tablet Take 1 tablet by mouth daily. 1,000 of Vit D3    [provider]  cholecalciferol (VITAMIN D) 1000 units tablet Take 1,000 Units by mouth daily.    [provider]  clobetasol cream (TEMOVATE) 0.05 % Apply 1 Application topically 2 (two) times daily.    [provider]  FLUoxetine (PROZAC) 20 MG capsule Take 1 capsule by mouth once daily 09/10/23   Shade Flood, MD  Ginger Root POWD by Does not apply route. Mixes 1/2 teaspoon of powder into hot tea once daily    [provider]  Multiple Vitamins-Minerals (MULTIVITAMIN WITH MINERALS) tablet Take 1 tablet by mouth once a week. Centrum Silver    [provider]  nitroGLYCERIN (NITROSTAT) 0.4 MG SL tablet Place 1 tablet (0.4 mg total) under the tongue every 5 (five) minutes x 3 doses as needed for chest pain. 03/03/20   Lennette Bihari, MD  ticagrelor [facility]) 60 MG TABS tablet Take 1 tablet by mouth twice daily 05/08/23   Lennette Bihari, MD  vitamin C (ASCORBIC ACID) 500 MG tablet Take 500 mg by mouth daily.    [provider]      Allergies    Patient has no known allergies.    Review of Systems   Review of Systems  All other systems reviewed and are negative.   Physical Exam Updated Vital Signs BP 133/65   Pulse (!) 50   Temp 98 F (36.7 C)   Resp 16   SpO2 96%  Physical Exam Vitals and nursing note reviewed.  Constitutional:      General: He is not in acute distress. HENT:     Head: Normocephalic and atraumatic.  Eyes:     Conjunctiva/sclera: Conjunctivae normal.     Pupils: Pupils are equal, round, and reactive to light.  Cardiovascular:     Rate and Rhythm: Normal rate and regular rhythm.  Pulmonary:     Effort: Pulmonary effort is normal. No respiratory distress.  Abdominal:     General: There is no distension.     Tenderness: There is no guarding.  Musculoskeletal:        General: No deformity or signs of injury.     Cervical back: Neck supple.     Comments: 1.5 linear laceration, wound is well-approximated, hemostatic, lateral to the nail along the left thumb  Skin:    Findings: No lesion or rash.  Neurological:     General: No focal deficit present.     Mental Status: He is alert. Mental status is at baseline.     ED Results / Procedures / Treatments   Labs (all labs ordered are listed, but only abnormal results are displayed) Labs Reviewed - No data to display  EKG None  Radiology DG Finger Thumb Left  Result Date: 09/10/2023 CLINICAL DATA:  A laceration, possible foreign body EXAM: LEFT THUMB 2+V COMPARISON:  None Available. FINDINGS: Soft tissue laceration within the left thumb. No acute  bony abnormality. Specifically, no fracture, subluxation, or dislocation. No radiopaque foreign body IMPRESSION: No fracture or foreign body. Electronically Signed   By: Charlett Nose M.D.   On: 09/10/2023 23:52    Procedures .Marland KitchenLaceration Repair  Date/Time: 09/11/2023 3:07 AM  Performed by: Ernie Avena, MD Authorized by: Ernie Avena, MD   Consent:    Consent obtained:  Verbal   Consent given by:  Patient   Risks discussed:  Infection, pain, poor cosmetic result and poor wound healing Universal protocol:    Patient identity confirmed:  Verbally with patient Anesthesia:    Anesthesia method:  None Laceration details:    Length (cm):  1.5 Exploration:    Imaging obtained: x-ray     Imaging outcome: foreign body not noted   Treatment:    Area cleansed with:  Chlorhexidine   Amount of cleaning:  Standard   Irrigation solution:  Tap water Skin repair:    Repair method:  Steri-Strips and tissue adhesive   Number of Steri-Strips:  3 Approximation:    Approximation:  Close Repair type:    Repair type:  Simple Post-procedure details:    Dressing:  Open (no dressing)   Procedure completion:  Tolerated     Medications Ordered in ED Medications - No data to display  ED Course/ Medical Decision Making/ A&P                                 Medical Decision Making Amount and/or Complexity of Data Reviewed Radiology: ordered.    82 year old male presenting to the emergency department with concern for linear laceration along the lateral aspect of his left thumb.  The patient states that he was cutting pipe with a box cutter and sustained a 1.5 cm laceration along the lateral aspect of the left thumb.  Laceration does not involve the nail.  His tetanus is up-to-date.  Wound is well-approximated and hemostatic on arrival.  Denies any other injuries or complaints.  On arrival, the patient was vitally stable.  Presenting with a small linear laceration to the lateral aspect of the  left thumb.  Edges were well-approximated, x-ray imaging was performed with no evidence of fracture or foreign body.  Wound closure was obtained after the patient's wound was cleaned with tap water bedside and prepped with chlorhexidine.  Steri-Strips and Dermabond was applied with good approximation.  Patient was provided with wound care instructions and his tetanus is up-to-date.  Overall stable for discharge at this time.    Final Clinical Impression(s) / ED Diagnoses Final diagnoses:  Thumb laceration,  left, initial encounter    Rx / DC Orders ED Discharge Orders     None         Ernie Avena, MD 09/11/23 [id number]

## 2023-09-11 NOTE — ED Notes (Signed)
Pt reports tetanus is UTD as of this year.

## 2023-09-14 ENCOUNTER — Other Ambulatory Visit: Payer: Self-pay | Admitting: Cardiovascular Disease

## 2023-09-14 DIAGNOSIS — H43812 Vitreous degeneration, left eye: Secondary | ICD-10-CM | POA: Diagnosis not present

## 2023-10-01 DIAGNOSIS — L249 Irritant contact dermatitis, unspecified cause: Secondary | ICD-10-CM | POA: Diagnosis not present

## 2023-10-03 ENCOUNTER — Telehealth: Payer: Self-pay | Admitting: *Deleted

## 2023-10-03 NOTE — Telephone Encounter (Signed)
Transition Care Management Unsuccessful Follow-up Telephone Call  Date of discharge and from where:  [facility]  09/11/2023  Attempts:  1st Attempt  Reason for unsuccessful TCM follow-up call:  No answer/busy

## 2023-10-09 ENCOUNTER — Telehealth: Payer: Self-pay | Admitting: *Deleted

## 2023-10-09 NOTE — Progress Notes (Signed)
Transition Care Management Follow-up Telephone Call Date of discharge and from where: 09/11/2023 [facility] How have you been since you were released from the hospital? Doing great it was aminor laceration  Any questions or concerns? No  Items Reviewed: Did the pt receive and understand the discharge instructions provided? Yes  Medications obtained and verified? Yes  Other? No  Any new allergies since your discharge? No  Dietary orders reviewed? No Do you have support at home? Yes     Follow up appointments reviewed:  PCP Hospital f/u appt confirmed? No  Just a minor laceration . Are transportation arrangements needed? No  If their condition worsens, is the pt aware to call PCP or go to the Emergency Dept.? Yes Was the patient provided with contact information for the PCP's office or ED? Yes Was to pt encouraged to call back with questions or concerns? Yes

## 2023-10-14 ENCOUNTER — Other Ambulatory Visit: Payer: Self-pay | Admitting: Cardiovascular Disease

## 2023-11-02 ENCOUNTER — Encounter: Payer: Self-pay | Admitting: Family Medicine

## 2023-11-07 ENCOUNTER — Ambulatory Visit: Payer: Medicare Other | Admitting: Family Medicine

## 2023-11-14 ENCOUNTER — Ambulatory Visit: Payer: Medicare Other | Admitting: Family Medicine

## 2023-11-14 VITALS — BP 122/60 | HR 58 | Temp 98.3°F | Ht 68.0 in | Wt 171.4 lb

## 2023-11-14 DIAGNOSIS — F32A Depression, unspecified: Secondary | ICD-10-CM

## 2023-11-14 DIAGNOSIS — M7052 Other bursitis of knee, left knee: Secondary | ICD-10-CM

## 2023-11-14 DIAGNOSIS — E785 Hyperlipidemia, unspecified: Secondary | ICD-10-CM | POA: Diagnosis not present

## 2023-11-14 DIAGNOSIS — I10 Essential (primary) hypertension: Secondary | ICD-10-CM

## 2023-11-14 NOTE — Patient Instructions (Addendum)
Thank you for coming in today.  No medication changes at this time.  If symptoms are stable with intermittent dosing of fluoxetine, that is reasonable.  Let me know if any changes needed.  If any concerns on labs I will let you know.  Swelling in front of knee below the kneecap is likely a bursitis or swelling of the fluid-filled sac where the patellar tendon attaches to the lower leg (infrapatellar bursa) That may be from the repetitive stair climbing.  I would recommend decreasing that temporarily, especially if any persistent swelling but follow-up if any pain in that area or symptoms are worsening.  Take care!

## 2023-11-14 NOTE — Progress Notes (Unsigned)
Subjective:  Patient ID: Timothy Escobar, male    DOB: [birth year]  Age: 83 y.o. MRN: [id number]  CC:  Chief Complaint  Patient presents with   Medical Management of Chronic Issues    Doing okay for the most part    Knee Pain    Pt notes Lt knee has a lump for a few months seems soft and mobile, notes he does get some knee discomfort when walking     HPI Timothy Escobar presents for  Chronic conditions as well as acute concern as above  Left knee pain: Lump in left knee noted past few months, soft and mobile - below knee cap. Intermittent knee pain in both sides in bone, not limiting activity. Not sore in area of swelling.  Uses stairs in home.  no fall or injury.  Tx: none. Swelling resolves on own with rest at times.   Hyperlipidemia: With history of CAD, STEMI in 2017, target LDL less than 70, cardiologist Dr. Tresa Endo.  Atorvastatin 80 mg daily without any myalgias or side effects.  Prior echo with aortic sclerosis without stenosis. Lab Results  Component Value Date   CHOL 127 05/07/2023   HDL 64.00 05/07/2023   LDLCALC 52 05/07/2023   TRIG 51.0 05/07/2023   CHOLHDL 2 05/07/2023   Lab Results  Component Value Date   ALT 20 05/07/2023   AST 23 05/07/2023   ALKPHOS 71 05/07/2023   BILITOT 1.2 05/07/2023   Hypertension: Amlodipine 5 mg daily without any new side effects. Home readings: BP Readings from Last 3 Encounters:  11/14/23 122/60  09/11/23 (!) 142/73  08/28/23 128/64   Lab Results  Component Value Date   CREATININE 1.00 05/07/2023   Erectile dysfunction Treated with Viagra 50 mg prior - not taking recently, not needed.   Depression: Treatment with fluoxetine 20 mg dose, breakthrough symptoms off medication with previous trial off SSRI. Some erectile dysfunction, delay in ejaculation prio - improved with 3-4 times per week use of fluoxetine.  Mood doing ok.     11/14/2023    2:48 PM 05/07/2023   10:16 AM 01/18/2023    8:17 AM 11/10/2022    9:17 AM  09/04/2022   11:04 AM  Depression screen PHQ 2/9  Decreased Interest 0 0 0 0 0  Down, Depressed, Hopeless 0 0 0 0 1  PHQ - 2 Score 0 0 0 0 1  Altered sleeping 0 0 0 0 1  Tired, decreased energy 0 0 0 0 1  Change in appetite 0 0 0 0 0  Feeling bad or failure about yourself  0 0 0 0 1  Trouble concentrating 0 0 0 0 0  Moving slowly or fidgety/restless 0 0 0 0 0  Suicidal thoughts 0 0 0 0 0  PHQ-9 Score 0 0 0 0 4  Difficult doing work/chores   Not difficult at all      History Patient Active Problem List   Diagnosis Date Noted   Sinus bradycardia 01/23/2017   Dyslipidemia    CAD (coronary artery disease)    Status post coronary artery stent placement    Bradycardia    Hypokalemia    Hyperlipidemia with target LDL less than 70    STEMI (ST elevation myocardial infarction) (HCC) 08/22/2016   Acute ST elevation myocardial infarction (STEMI) involving left anterior descending (LAD) coronary artery (HCC) 08/22/2016   Hip pain 09/28/2012   Chronic depression 09/28/2012   Past Medical History:  Diagnosis Date  Blood transfusion without reported diagnosis    CAD (coronary artery disease)    a. 08/22/2016 Ant STEMI/PCI: LM nl, LAD 30ost, 100p (3.0x15 Synergy DES & 2.75x24 Synergy DES), LCX large/dominant/nl, RCA nondom, nl;  b. 08/2016 Echo: Ef 60-65%, no rwma.   Cataract    [employer]reesia 03/29/2020   Depression    Dyslipidemia    Heart murmur    [employer]reesia 03/29/2020   Myocardial infarction [facility])    [employer]reesia 03/29/2020   Skin cancer    skin cancer   Past Surgical History:  Procedure Laterality Date   APPENDECTOMY     CARDIAC CATHETERIZATION N/A 08/22/2016   Procedure: Left Heart Cath and Coronary Angiography;  Surgeon: Lennette Bihari, MD;  Location: [facility] INVASIVE CV LAB;  Service: Cardiovascular;  Laterality: N/A;   CARDIAC CATHETERIZATION N/A 08/22/2016   Procedure: Coronary Stent Intervention;  Surgeon: Lennette Bihari, MD;  Location: [facility] INVASIVE CV LAB;  Service: Cardiovascular;   Laterality: N/A;  MID LAD   COLON SURGERY N/A    [employer]reesia 03/29/2020   HERNIA REPAIR     parotid gland cyst removal Right 2012   vocal fold polyp removed     No Known Allergies Prior to Admission medications   Medication Sig Start Date End Date Taking? Authorizing Provider  amLODipine (NORVASC) 5 MG tablet TAKE ONE HALF TABLET BY MOUTH DAILY FOR ONE WEEK, IF SYSTOLIC ABOVE 140 INCREASE TO ONE TABLET 10/15/23  Yes Lennette Bihari, MD  aspirin EC 81 MG EC tablet Take 1 tablet (81 mg total) by mouth daily. 08/26/16  Yes Valentino Nose, MD  atorvastatin (LIPITOR) 80 MG tablet TAKE 1 TABLET BY MOUTH IN THE EVENING AT 6 PM 09/17/23  Yes Lennette Bihari, MD  calcium-vitamin D (OSCAL WITH D) 500-200 MG-UNIT per tablet Take 1 tablet by mouth daily. 1,000 of Vit D3   Yes [provider]  cholecalciferol (VITAMIN D) 1000 units tablet Take 1,000 Units by mouth daily.   Yes [provider]  clobetasol cream (TEMOVATE) 0.05 % Apply 1 Application topically 2 (two) times daily.   Yes [provider]  FLUoxetine (PROZAC) 20 MG capsule Take 1 capsule by mouth once daily Patient taking differently: Take 20 mg by mouth daily. 3 times a week 09/10/23  Yes Shade Flood, MD  Ginger Root POWD by Does not apply route. Mixes 1/2 teaspoon of powder into hot tea once daily   Yes [provider]  Multiple Vitamins-Minerals (MULTIVITAMIN WITH MINERALS) tablet Take 1 tablet by mouth once a week. Centrum Silver   Yes [provider]  nitroGLYCERIN (NITROSTAT) 0.4 MG SL tablet Place 1 tablet (0.4 mg total) under the tongue every 5 (five) minutes x 3 doses as needed for chest pain. 03/03/20  Yes Lennette Bihari, MD  ticagrelor [facility]) 60 MG TABS tablet Take 1 tablet by mouth twice daily 05/08/23  Yes Lennette Bihari, MD  vitamin C (ASCORBIC ACID) 500 MG tablet Take 500 mg by mouth daily.   Yes [provider]   Social History   Socioeconomic History   Marital  status: Married    Spouse name: Not on file   Number of children: 2   Years of education: college   Highest education level: Master's degree (e.g., MA, MS, MEng, MEd, MSW, MBA)  Occupational History   Occupation: Retired - Naval architect  Tobacco Use   Smoking status: Former    Current packs/day: 0.00    Types: Cigarettes  Quit date: 01/22/1967    Years since quitting: 56.8   Smokeless tobacco: Never  Vaping Use   Vaping status: Never Used  Substance and Sexual Activity   Alcohol use: Yes    Alcohol/week: 14.0 standard drinks of alcohol    Types: 14 Standard drinks or equivalent per week    Comment: wine: 2 glass daily   Drug use: Never   Sexual activity: Never  Other Topics Concern   Not on file  Social History Narrative   Lives w/ wife in [place]. Both are singers, they sing with the [employer] in [place] and with [employer], plus the church choir.   Exercise walk/gardening daily for 1 hr plus   Social Drivers of Health   Financial Resource Strain: Low Risk  (11/10/2023)   Overall Financial Resource Strain (CARDIA)    Difficulty of Paying Living Expenses: Not hard at all  Food Insecurity: No Food Insecurity (11/10/2023)   Hunger Vital Sign    Worried About Running Out of Food in the Last Year: Never true    Ran Out of Food in the Last Year: Never true  Transportation Needs: No Transportation Needs (11/10/2023)   PRAPARE - Administrator, Civil Service (Medical): No    Lack of Transportation (Non-Medical): No  Physical Activity: Insufficiently Active (11/10/2023)   Exercise Vital Sign    Days of Exercise per Week: 5 days    Minutes of Exercise per Session: 20 min  Stress: No Stress Concern Present (11/10/2023)   [employer] of Occupational Health - Occupational Stress Questionnaire    Feeling of Stress : Not at all  Social Connections: Moderately Integrated (11/10/2023)   Social Connection and Isolation Panel [NHANES]    Frequency of  Communication with Friends and Family: More than three times a week    Frequency of Social Gatherings with Friends and Family: Once a week    Attends Religious Services: Never    Database administrator or Organizations: Yes    Attends Engineer, structural: More than 4 times per year    Marital Status: Married  Catering manager Violence: Not At Risk (01/18/2023)   Humiliation, Afraid, Rape, and Kick questionnaire    Fear of Current or Ex-Partner: No    Emotionally Abused: No    Physically Abused: No    Sexually Abused: No    Review of Systems   Objective:   Vitals:   11/14/23 1446  BP: 122/60  Pulse: (!) 58  Temp: 98.3 F (36.8 C)  TempSrc: Temporal  SpO2: 97%  Weight: 171 lb 6.4 oz (77.7 kg)  Height: 5\' 8"  (1.727 m)     Physical Exam Vitals reviewed.  Constitutional:      Appearance: He is well-developed.  HENT:     Head: Normocephalic and atraumatic.  Neck:     Vascular: No carotid bruit or JVD.  Cardiovascular:     Rate and Rhythm: Normal rate and regular rhythm.     Heart sounds: Normal heart sounds. No murmur heard. Pulmonary:     Effort: Pulmonary effort is normal.     Breath sounds: Normal breath sounds. No rales.  Musculoskeletal:     Right lower leg: No edema.     Left lower leg: No edema.     Comments: Left knee, small fluctuant area inferior to patella, just proximal to tibial tuberosity.  No bony tenderness, patella nontender, no extensor lag, no patella alta or [place].  Pain-free knee motion  with negative McMurray, Lachman's, patellar grind.  Skin:    General: Skin is warm and dry.  Neurological:     Mental Status: He is alert and oriented to person, place, and time.  Psychiatric:        Mood and Affect: Mood normal.     Assessment & Plan:  Timothy Escobar is a 83 y.o. male . Infrapatellar bursitis of left knee -Minimal symptoms but likely infrapatellar bursitis, possibly attributed to repetitive stair climbing.  Activity modification  discussed with RTC precautions if worsening symptoms, quickly resolves and minimal impact at this time.  Essential hypertension  -Stable with current regimen, continue same.  Check labs and adjust plan accordingly.  Hyperlipidemia, unspecified hyperlipidemia type - Plan: Comprehensive metabolic panel, Lipid panel  -Check CMP, lipid panel, continue Lipitor same dose for now.  Depression, unspecified depression type  -Stable, continue fluoxetine same dose with intermittent dosing as that has been stable.  No orders of the defined types were placed in this encounter.  Patient Instructions  Thank you for coming in today.  No medication changes at this time.  If symptoms are stable with intermittent dosing of fluoxetine, that is reasonable.  Let me know if any changes needed.  If any concerns on labs I will let you know.  Swelling in front of knee below the kneecap is likely a bursitis or swelling of the fluid-filled sac where the patellar tendon attaches to the lower leg (infrapatellar bursa) That may be from the repetitive stair climbing.  I would recommend decreasing that temporarily, especially if any persistent swelling but follow-up if any pain in that area or symptoms are worsening.  Take care!    Signed,   Meredith Staggers, MD [facility], [facility] 11/14/23 3:40 PM

## 2023-11-15 ENCOUNTER — Encounter: Payer: Self-pay | Admitting: Family Medicine

## 2023-11-15 ENCOUNTER — Ambulatory Visit: Payer: Medicare Other | Admitting: Podiatry

## 2023-11-15 LAB — COMPREHENSIVE METABOLIC PANEL
ALT: 19 U/L (ref 0–53)
AST: 23 U/L (ref 0–37)
Albumin: 4.4 g/dL (ref 3.5–5.2)
Alkaline Phosphatase: 78 U/L (ref 39–117)
BUN: 16 mg/dL (ref 6–23)
CO2: 29 meq/L (ref 19–32)
Calcium: 9 mg/dL (ref 8.4–10.5)
Chloride: 106 meq/L (ref 96–112)
Creatinine, Ser: 0.92 mg/dL (ref 0.40–1.50)
GFR: 78.33 mL/min (ref >60.00)
Glucose, Bld: 117 mg/dL — ABNORMAL HIGH (ref 70–99)
Potassium: 4.4 meq/L (ref 3.5–5.1)
Sodium: 142 meq/L (ref 135–145)
Total Bilirubin: 0.6 mg/dL (ref 0.2–1.2)
Total Protein: 6.4 g/dL (ref 6.0–8.3)

## 2023-11-15 LAB — LIPID PANEL
Cholesterol: 134 mg/dL (ref 0–200)
HDL: 64.2 mg/dL (ref >39.00)
LDL Cholesterol: 48 mg/dL (ref 0–99)
NonHDL: 69.99
Total CHOL/HDL Ratio: 2
Triglycerides: 112 mg/dL (ref 0.0–149.0)
VLDL: 22.4 mg/dL (ref 0.0–40.0)

## 2023-11-21 ENCOUNTER — Encounter: Payer: Self-pay | Admitting: Family Medicine

## 2023-12-03 ENCOUNTER — Ambulatory Visit: Payer: Medicare Other | Admitting: Podiatry

## 2023-12-03 ENCOUNTER — Encounter: Payer: Self-pay | Admitting: Podiatry

## 2023-12-03 ENCOUNTER — Ambulatory Visit (INDEPENDENT_AMBULATORY_CARE_PROVIDER_SITE_OTHER): Payer: Medicare Other

## 2023-12-03 VITALS — Ht 68.0 in | Wt 171.0 lb

## 2023-12-03 DIAGNOSIS — M7671 Peroneal tendinitis, right leg: Secondary | ICD-10-CM

## 2023-12-03 DIAGNOSIS — M7751 Other enthesopathy of right foot: Secondary | ICD-10-CM

## 2023-12-03 MED ORDER — TRIAMCINOLONE ACETONIDE 10 MG/ML IJ SUSP
10.0000 mg | Freq: Once | INTRAMUSCULAR | Status: AC
  Administered 2023-12-03: 10 mg via INTRA_ARTICULAR

## 2023-12-03 NOTE — Progress Notes (Signed)
 Subjective:   Patient ID: Timothy Escobar, male   DOB: 83 y.o.   MRN: [id number]   HPI Patient states he has developed a lot of pain in his right ankle and distal to that and does not remember specific injury   ROS      Objective:  Physical Exam  Neurovascular status intact quite a bit of acute inflammation in the sinus tarsi right and into the right peroneal group     Assessment:  Appears to be inflammatory capsulitis with peroneal tendinitis cannot rule out other pathology     Plan:  H&P reviewed sterile prep injected the sinus tarsi 3 mg Kenalog  5 mg Xylocaine  carefully taking it distally also and applied sterile dressing.  Reappoint as symptoms indicate  X-rays indicate no signs of arthritis or bony pathology associated with pain and discomfort

## 2024-01-07 DIAGNOSIS — D1801 Hemangioma of skin and subcutaneous tissue: Secondary | ICD-10-CM | POA: Diagnosis not present

## 2024-01-07 DIAGNOSIS — L720 Epidermal cyst: Secondary | ICD-10-CM | POA: Diagnosis not present

## 2024-01-07 DIAGNOSIS — Z08 Encounter for follow-up examination after completed treatment for malignant neoplasm: Secondary | ICD-10-CM | POA: Diagnosis not present

## 2024-01-07 DIAGNOSIS — L821 Other seborrheic keratosis: Secondary | ICD-10-CM | POA: Diagnosis not present

## 2024-01-07 DIAGNOSIS — L814 Other melanin hyperpigmentation: Secondary | ICD-10-CM | POA: Diagnosis not present

## 2024-01-07 DIAGNOSIS — Z85828 Personal history of other malignant neoplasm of skin: Secondary | ICD-10-CM | POA: Diagnosis not present

## 2024-01-11 ENCOUNTER — Other Ambulatory Visit: Payer: Self-pay | Admitting: Family Medicine

## 2024-01-28 ENCOUNTER — Ambulatory Visit (HOSPITAL_COMMUNITY): Payer: Medicare Other | Attending: Cardiovascular Disease

## 2024-01-28 DIAGNOSIS — I25118 Atherosclerotic heart disease of native coronary artery with other forms of angina pectoris: Secondary | ICD-10-CM | POA: Diagnosis not present

## 2024-01-28 DIAGNOSIS — R001 Bradycardia, unspecified: Secondary | ICD-10-CM | POA: Diagnosis not present

## 2024-01-28 DIAGNOSIS — I358 Other nonrheumatic aortic valve disorders: Secondary | ICD-10-CM | POA: Diagnosis not present

## 2024-01-28 DIAGNOSIS — I700 Atherosclerosis of aorta: Secondary | ICD-10-CM | POA: Diagnosis not present

## 2024-01-28 LAB — ECHOCARDIOGRAM COMPLETE
Area-P 1/2: 3.03 cm2
S' Lateral: 2.3 cm

## 2024-02-05 ENCOUNTER — Ambulatory Visit: Admitting: *Deleted

## 2024-02-05 DIAGNOSIS — Z0000 Encounter for general adult medical examination without abnormal findings: Secondary | ICD-10-CM

## 2024-02-05 NOTE — Progress Notes (Signed)
 Subjective:   Timothy Escobar is a 83 y.o. male who presents for Medicare Annual/Subsequent preventive examination.  Visit Complete: Virtual I connected with  Greig Castilla on 02/05/24 by a audio enabled telemedicine application and verified that I am speaking with the correct person using two identifiers.  Patient Location: Home  Provider Location: Home Office  I discussed the limitations of evaluation and management by telemedicine. The patient expressed understanding and agreed to proceed.  Vital Signs: Because this visit was a virtual/telehealth visit, some criteria may be missing or patient reported. Any vitals not documented were not able to be obtained and vitals that have been documented are patient reported.  Patient Medicare AWV questionnaire was completed by the patient on 4+8+2025; I have confirmed that all information answered by patient is correct and no changes since this date.  Cardiac Risk Factors include: advanced age (>55men, >47 women);hypertension;male gender;family history of premature cardiovascular disease     Objective:    There were no vitals filed for this visit. There is no height or weight on file to calculate BMI.     02/05/2024    9:33 AM 09/10/2023    9:54 PM 01/18/2023    8:10 AM 10/06/2021    1:40 PM 09/28/2020    8:16 AM 08/07/2019   10:06 AM 12/18/2017    9:20 AM  Advanced Directives  Does Patient Have a Medical Advance Directive? No No Yes Yes Yes Yes Yes  Type of Chief of Staff of [employer];Living will Living will Living will;Healthcare Power of Attorney Living will  Does patient want to make changes to medical advance directive?     No - Patient declined No - Patient declined No - Patient declined  Copy of Healthcare Power of Attorney in Chart?   No - copy requested No - copy requested  No - copy requested   Would patient like information on creating a medical advance directive? No -  Patient declined          Current Medications (verified) Outpatient Encounter Medications as of 02/05/2024  Medication Sig   amLODipine (NORVASC) 5 MG tablet TAKE ONE HALF TABLET BY MOUTH DAILY FOR ONE WEEK, IF SYSTOLIC ABOVE 140 INCREASE TO ONE TABLET   aspirin EC 81 MG EC tablet Take 1 tablet (81 mg total) by mouth daily.   atorvastatin (LIPITOR) 80 MG tablet TAKE 1 TABLET BY MOUTH IN THE EVENING AT 6 PM   calcium-vitamin D (OSCAL WITH D) 500-200 MG-UNIT per tablet Take 1 tablet by mouth daily. 1,000 of Vit D3   cholecalciferol (VITAMIN D) 1000 units tablet Take 1,000 Units by mouth daily.   clobetasol cream (TEMOVATE) 0.05 % Apply 1 Application topically 2 (two) times daily.   FLUoxetine (PROZAC) 20 MG capsule Take 1 capsule by mouth once daily   Ginger Root POWD by Does not apply route. Mixes 1/2 teaspoon of powder into hot tea once daily   Multiple Vitamins-Minerals (MULTIVITAMIN WITH MINERALS) tablet Take 1 tablet by mouth once a week. Centrum Silver   nitroGLYCERIN (NITROSTAT) 0.4 MG SL tablet Place 1 tablet (0.4 mg total) under the tongue every 5 (five) minutes x 3 doses as needed for chest pain.   ticagrelor (BRILINTA) 60 MG TABS tablet Take 1 tablet by mouth twice daily   vitamin C (ASCORBIC ACID) 500 MG tablet Take 500 mg by mouth daily.   No facility-administered encounter medications on file as of 02/05/2024.  Allergies (verified) Patient has no known allergies.   History: Past Medical History:  Diagnosis Date   Blood transfusion without reported diagnosis    CAD (coronary artery disease)    a. 08/22/2016 Ant STEMI/PCI: LM nl, LAD 30ost, 100p (3.0x15 Synergy DES & 2.75x24 Synergy DES), LCX large/dominant/nl, RCA nondom, nl;  b. 08/2016 Echo: Ef 60-65%, no rwma.   Cataract    Phreesia 03/29/2020   Depression    Dyslipidemia    Heart murmur    [facility]reesia 03/29/2020   Myocardial infarction [facility])    Phreesia 03/29/2020   Skin cancer    skin cancer   Past Surgical  History:  Procedure Laterality Date   APPENDECTOMY     CARDIAC CATHETERIZATION N/A 08/22/2016   Procedure: Left Heart Cath and Coronary Angiography;  Surgeon: Lennette Bihari, MD;  Location: [facility];  Service: Cardiovascular;  Laterality: N/A;   CARDIAC CATHETERIZATION N/A 08/22/2016   Procedure: Coronary Stent Intervention;  Surgeon: Lennette Bihari, MD;  Location: [facility];  Service: Cardiovascular;  Laterality: N/A;  MID LAD   COLON SURGERY N/A    [employer]reesia 03/29/2020   HERNIA REPAIR     parotid gland cyst removal Right 2012   vocal fold polyp removed     Family History  Problem Relation Age of Onset   Dementia Mother    Cancer Father        skin   Heart disease Maternal Grandmother    Stroke Maternal Grandmother    Cancer Sister        breast (pt did not specific L or R)   Cancer Brother        bladder and pancreatic   Heart disease Brother    Cancer Brother    Heart disease Sister    Cancer Sister    Hypertension Sister    Cancer Paternal Grandmother    Social History   Socioeconomic History   Marital status: Married    Spouse name: Not on file   Number of children: 2   Years of education: college   Highest education level: Master's degree (e.g., MA, MS, MEng, MEd, MSW, MBA)  Occupational History   Occupation: Retired - Naval architect  Tobacco Use   Smoking status: Former    Current packs/day: 0.00    Types: Cigarettes    Quit date: 01/22/1967    Years since quitting: 57.0   Smokeless tobacco: Never  Vaping Use   Vaping status: Never Used  Substance and Sexual Activity   Alcohol use: Yes    Alcohol/week: 14.0 standard drinks of alcohol    Types: 14 Standard drinks or equivalent per week    Comment: wine: 2 glass daily   Drug use: Never   Sexual activity: Never  Other Topics Concern   Not on file  Social History Narrative   Lives w/ wife in [place]. Both are singers, they sing with the [employer] in [place] and with [employer], plus the church choir.   Exercise walk/gardening daily for 1 hr plus   Social Drivers of Health   Financial Resource Strain: Low Risk  (02/05/2024)   Overall Financial Resource Strain (CARDIA)    Difficulty of Paying Living Expenses: Not hard at all  Food Insecurity: No Food Insecurity (02/05/2024)   Hunger Vital Sign    Worried About Running Out of Food in the Last Year: Never true    Ran Out of Food in the Last Year: Never true  Transportation Needs: No Transportation Needs (02/05/2024)   PRAPARE - Administrator, Civil Service (Medical): No    Lack of Transportation (Non-Medical): No  Physical Activity: Insufficiently Active (02/05/2024)   Exercise Vital Sign    Days of Exercise per Week: 5 days    Minutes of Exercise per Session: 20 min  Stress: No Stress Concern Present (02/05/2024)   [employer] of Occupational Health - Occupational Stress Questionnaire    Feeling of Stress : Not at all  Social Connections: Moderately Integrated (02/05/2024)   Social Connection and Isolation Panel [NHANES]    Frequency of Communication with Friends and Family: More than three times a week    Frequency of Social Gatherings with Friends and Family: Once a week    Attends Religious Services: Never    Database administrator or Organizations: Yes    Attends Engineer, structural: More than 4 times per year    Marital Status: Married    Tobacco Counseling Counseling given: Not Answered   Clinical Intake:  Pre-visit preparation completed: Yes  Pain : No/denies pain     Diabetes: No  How often do you need to have someone help you when you read instructions, pamphlets, or other written materials from your doctor or pharmacy?: 1 - Never  Interpreter Needed?: No  Information entered by :: Remi Haggard LPN   Activities of Daily Living    02/05/2024    9:34 AM 01/29/2024   11:13 AM  In your present state of health, do you have any difficulty performing the  following activities:  Hearing? 0 0  Vision? 0 0  Difficulty concentrating or making decisions? 0 0  Walking or climbing stairs? 0 0  Dressing or bathing? 0 0  Doing errands, shopping? 0 0  Preparing Food and eating ? N N  Using the Toilet? N N  In the past six months, have you accidently leaked urine? N N  Do you have problems with loss of bowel control? N N  Managing your Medications? N N  Managing your Finances? N N  Housekeeping or managing your Housekeeping? N N    Patient Care Team: Shade Flood, MD as PCP - General (Family Medicine) Lennette Bihari, MD as PCP - Cardiology (Cardiology) Newman Pies, MD as Consulting Physician (Otolaryngology)  Indicate any recent Medical Services you may have received from other than [facility] providers in the past year (date may be approximate).     Assessment:   This is a routine wellness examination for [place].  Hearing/Vision screen Hearing Screening - Comments:: No trouble hearing Vision Screening - Comments:: [place] Up to date   Goals Addressed             This Visit's Progress    Patient Stated       Continue current lifestyle       Depression Screen    02/05/2024    9:35 AM 11/14/2023    2:48 PM 05/07/2023   10:16 AM 01/18/2023    8:17 AM 11/10/2022    9:17 AM 09/04/2022   11:04 AM 05/10/2022    1:40 PM  PHQ 2/9 Scores  PHQ - 2 Score 1 0 0 0 0 1 1  PHQ- 9 Score 3 0 0 0 0 4 2    Fall Risk    02/05/2024    9:34 AM 01/29/2024   11:13 AM 11/14/2023    2:46 PM 05/07/2023   10:16 AM 01/18/2023  8:10 AM  Fall Risk   Falls in the past year? 0 0 0 0 0  Number falls in past yr: 0 0 0 0 0  Injury with Fall? 0 0 0 0 0  Risk for fall due to :   No Fall Risks No Fall Risks   Follow up Falls evaluation completed;Education provided;Falls prevention discussed  Falls evaluation completed Falls evaluation completed Falls evaluation completed;Education provided;Falls prevention discussed    MEDICARE RISK AT HOME: Medicare Risk  at Home Any stairs in or around the home?: Yes If so, are there any without handrails?: No Home free of loose throw rugs in walkways, pet beds, electrical cords, etc?: Yes Adequate lighting in your home to reduce risk of falls?: Yes Life alert?: No Use of a cane, walker or w/c?: No Grab bars in the bathroom?: No Shower chair or bench in shower?: Yes Elevated toilet seat or a handicapped toilet?: No  TIMED UP AND GO:  Was the test performed?  No    Cognitive Function:        01/18/2023    8:13 AM 08/07/2019   10:07 AM 12/18/2017    9:34 AM  6CIT Screen  What Year? 0 points 0 points 0 points  What month? 0 points 0 points 0 points  What time?  0 points 0 points  Count back from 20 0 points 0 points 0 points  Months in reverse 0 points 0 points 0 points  Repeat phrase 0 points 2 points 0 points  Total Score  2 points 0 points    Immunizations Immunization History  Administered Date(s) Administered   Fluad Quad(high Dose 65+) 07/25/2019, 07/21/2020, 08/09/2022   Influenza,inj,Quad PF,6+ Mos 07/18/2013, 07/29/2014, 08/30/2015, 07/20/2016, 08/01/2017, 08/21/2018   Influenza-Unspecified 07/23/2012, 07/23/2021, 07/16/2023   PFIZER(Purple Top)SARS-COV-2 Vaccination 11/12/2019, 11/30/2019, 07/20/2020, 02/02/2021, 07/18/2022   Pfizer(Comirnaty)Fall Seasonal Vaccine 12 years and older 08/02/2023   Pneumococcal Conjugate-13 12/18/2017   Pneumococcal Polysaccharide-23 05/13/2008   Td 05/13/2008   Tdap 09/26/2012   Zoster, Live 10/23/2009    TDAP status: Up to date  Flu Vaccine status: Up to date  Pneumococcal vaccine status: Up to date  Covid-19 vaccine status: Information provided on how to obtain vaccines.   Qualifies for Shingles Vaccine? Yes   Zostavax completed No   Shingrix Completed?: No.    Education has been provided regarding the importance of this vaccine. Patient has been advised to call insurance company to determine out of pocket expense if they have not yet  received this vaccine. Advised may also receive vaccine at local pharmacy or Health Dept. Verbalized acceptance and understanding.  Screening Tests Health Maintenance  Topic Date Due   COVID-19 Vaccine (7 - Pfizer risk 2024-25 season) 01/31/2024   INFLUENZA VACCINE  05/23/2024   Colonoscopy  07/06/2024   Medicare Annual Wellness (AWV)  02/04/2025   Pneumonia Vaccine 42+ Years old  Completed   HPV VACCINES  Aged Out   Meningococcal B Vaccine  Aged Out   DTaP/Tdap/Td  Discontinued   Zoster Vaccines- Shingrix  Discontinued    Health Maintenance  Health Maintenance Due  Topic Date Due   COVID-19 Vaccine (7 - Pfizer risk 2024-25 season) 01/31/2024    Colorectal cancer screening: No longer required.   Lung Cancer Screening: (Low Dose CT Chest recommended if Age 41-80 years, 20 pack-year currently smoking OR have quit w/in 15years.) does not qualify.   Lung Cancer Screening Referral:   Additional Screening:  Hepatitis C Screening: does not qualify  Vision Screening: Recommended annual ophthalmology exams for early detection of glaucoma and other disorders of the eye. Is the patient up to date with their annual eye exam?  Yes  Who is the provider or what is the name of the office in which the patient attends annual eye exams? [facility] If pt is not established with a provider, would they like to be referred to a provider to establish care? No .   Dental Screening: Recommended annual dental exams for proper oral hygiene    Community Resource Referral / Chronic Care Management: CRR required this visit?  No   CCM required this visit?  No     Plan:     I have personally reviewed and noted the following in the patient's chart:   Medical and social history Use of alcohol, tobacco or illicit drugs  Current medications and supplements including opioid prescriptions. Patient is currently taking opioid prescriptions. Information provided to patient regarding non-opioid alternatives.  Patient advised to discuss non-opioid treatment plan with their provider. Functional ability and status Nutritional status Physical activity Advanced directives List of other physicians Hospitalizations, surgeries, and ER visits in previous 12 months Vitals Screenings to include cognitive, depression, and falls Referrals and appointments  In addition, I have reviewed and discussed with patient certain preventive protocols, quality metrics, and best practice recommendations. A written personalized care plan for preventive services as well as general preventive health recommendations were provided to patient.     Kieth Pelt, LPN   [id number]/36/6440   After Visit Summary: (MyChart) Due to this being a telephonic visit, the after visit summary with patients personalized plan was offered to patient via MyChart   Nurse Notes:

## 2024-02-05 NOTE — Patient Instructions (Signed)
 [name] , Thank you for taking time to come for your Medicare Wellness Visit. I appreciate your ongoing commitment to your health goals. Please review the following plan we discussed and let me know if I can assist you in the future.   Screening recommendations/referrals: Colonoscopy: no longer required Recommended yearly ophthalmology/optometry visit for glaucoma screening and checkup Recommended yearly dental visit for hygiene and checkup  Vaccinations: Influenza vaccine: up to date Pneumococcal vaccine: up to date Tdap vaccine: up to date Shingles vaccine:  Education provided       Preventive Care 65 Years and Older, Male Preventive care refers to lifestyle choices and visits with your health care provider that can promote health and wellness. What does preventive care include? A yearly physical exam. This is also called an annual well check. Dental exams once or twice a year. Routine eye exams. Ask your health care provider how often you should have your eyes checked. Personal lifestyle choices, including: Daily care of your teeth and gums. Regular physical activity. Eating a healthy diet. Avoiding tobacco and drug use. Limiting alcohol use. Practicing safe sex. Taking low doses of aspirin every day. Taking vitamin and mineral supplements as recommended by your health care provider. What happens during an annual well check? The services and screenings done by your health care provider during your annual well check will depend on your age, overall health, lifestyle risk factors, and family history of disease. Counseling  Your health care provider may ask you questions about your: Alcohol use. Tobacco use. Drug use. Emotional well-being. Home and relationship well-being. Sexual activity. Eating habits. History of falls. Memory and ability to understand (cognition). Work and work Astronomer. Screening  You may have the following tests or measurements: Height,  weight, and BMI. Blood pressure. Lipid and cholesterol levels. These may be checked every 5 years, or more frequently if you are over 40 years old. Skin check. Lung cancer screening. You may have this screening every year starting at age 58 if you have a 30-pack-year history of smoking and currently smoke or have quit within the past 15 years. Fecal occult blood test (FOBT) of the stool. You may have this test every year starting at age 33. Flexible sigmoidoscopy or colonoscopy. You may have a sigmoidoscopy every 5 years or a colonoscopy every 10 years starting at age 35. Prostate cancer screening. Recommendations will vary depending on your family history and other risks. Hepatitis C blood test. Hepatitis B blood test. Sexually transmitted disease (STD) testing. Diabetes screening. This is done by checking your blood sugar (glucose) after you have not eaten for a while (fasting). You may have this done every 1-3 years. Abdominal aortic aneurysm (AAA) screening. You may need this if you are a current or former smoker. Osteoporosis. You may be screened starting at age 24 if you are at high risk. Talk with your health care provider about your test results, treatment options, and if necessary, the need for more tests. Vaccines  Your health care provider may recommend certain vaccines, such as: Influenza vaccine. This is recommended every year. Tetanus, diphtheria, and acellular pertussis (Tdap, Td) vaccine. You may need a Td booster every 10 years. Zoster vaccine. You may need this after age 75. Pneumococcal 13-valent conjugate (PCV13) vaccine. One dose is recommended after age 22. Pneumococcal polysaccharide (PPSV23) vaccine. One dose is recommended after age 3. Talk to your health care provider about which screenings and vaccines you need and how often you need them. This information is not  intended to replace advice given to you by your health care provider. Make sure you discuss any  questions you have with your health care provider. Document Released: 11/05/2015 Document Revised: 06/28/2016 Document Reviewed: 08/10/2015 Elsevier Interactive Patient Education  2017 [employer].  Fall Prevention in the Home Falls can cause injuries. They can happen to people of all ages. There are many things you can do to make your home safe and to help prevent falls. What can I do on the outside of my home? Regularly fix the edges of walkways and driveways and fix any cracks. Remove anything that might make you trip as you walk through a door, such as a raised step or threshold. Trim any bushes or trees on the path to your home. Use bright outdoor lighting. Clear any walking paths of anything that might make someone trip, such as rocks or tools. Regularly check to see if handrails are loose or broken. Make sure that both sides of any steps have handrails. Any raised decks and porches should have guardrails on the edges. Have any leaves, snow, or ice cleared regularly. Use sand or salt on walking paths during winter. Clean up any spills in your garage right away. This includes oil or grease spills. What can I do in the bathroom? Use night lights. Install grab bars by the toilet and in the tub and shower. Do not use towel bars as grab bars. Use non-skid mats or decals in the tub or shower. If you need to sit down in the shower, use a plastic, non-slip stool. Keep the floor dry. Clean up any water that spills on the floor as soon as it happens. Remove soap buildup in the tub or shower regularly. Attach bath mats securely with double-sided non-slip rug tape. Do not have throw rugs and other things on the floor that can make you trip. What can I do in the bedroom? Use night lights. Make sure that you have a light by your bed that is easy to reach. Do not use any sheets or blankets that are too big for your bed. They should not hang down onto the floor. Have a firm chair that has side  arms. You can use this for support while you get dressed. Do not have throw rugs and other things on the floor that can make you trip. What can I do in the kitchen? Clean up any spills right away. Avoid walking on wet floors. Keep items that you use a lot in easy-to-reach places. If you need to reach something above you, use a strong step stool that has a grab bar. Keep electrical cords out of the way. Do not use floor polish or wax that makes floors slippery. If you must use wax, use non-skid floor wax. Do not have throw rugs and other things on the floor that can make you trip. What can I do with my stairs? Do not leave any items on the stairs. Make sure that there are handrails on both sides of the stairs and use them. Fix handrails that are broken or loose. Make sure that handrails are as long as the stairways. Check any carpeting to make sure that it is firmly attached to the stairs. Fix any carpet that is loose or worn. Avoid having throw rugs at the top or bottom of the stairs. If you do have throw rugs, attach them to the floor with carpet tape. Make sure that you have a light switch at the top of the stairs  and the bottom of the stairs. If you do not have them, ask someone to add them for you. What else can I do to help prevent falls? Wear shoes that: Do not have high heels. Have rubber bottoms. Are comfortable and fit you well. Are closed at the toe. Do not wear sandals. If you use a stepladder: Make sure that it is fully opened. Do not climb a closed stepladder. Make sure that both sides of the stepladder are locked into place. Ask someone to hold it for you, if possible. Clearly mark and make sure that you can see: Any grab bars or handrails. First and last steps. Where the edge of each step is. Use tools that help you move around (mobility aids) if they are needed. These include: Canes. Walkers. Scooters. Crutches. Turn on the lights when you go into a dark area.  Replace any light bulbs as soon as they burn out. Set up your furniture so you have a clear path. Avoid moving your furniture around. If any of your floors are uneven, fix them. If there are any pets around you, be aware of where they are. Review your medicines with your doctor. Some medicines can make you feel dizzy. This can increase your chance of falling. Ask your doctor what other things that you can do to help prevent falls. This information is not intended to replace advice given to you by your health care provider. Make sure you discuss any questions you have with your health care provider. Document Released: 08/05/2009 Document Revised: 03/16/2016 Document Reviewed: 11/13/2014 Elsevier Interactive Patient Education  2017 [employer].

## 2024-02-07 ENCOUNTER — Encounter: Payer: Self-pay | Admitting: Family Medicine

## 2024-02-21 DIAGNOSIS — H02206 Unspecified lagophthalmos left eye, unspecified eyelid: Secondary | ICD-10-CM | POA: Diagnosis not present

## 2024-02-21 DIAGNOSIS — Z961 Presence of intraocular lens: Secondary | ICD-10-CM | POA: Diagnosis not present

## 2024-02-21 DIAGNOSIS — H0102B Squamous blepharitis left eye, upper and lower eyelids: Secondary | ICD-10-CM | POA: Diagnosis not present

## 2024-02-21 DIAGNOSIS — H04123 Dry eye syndrome of bilateral lacrimal glands: Secondary | ICD-10-CM | POA: Diagnosis not present

## 2024-02-21 DIAGNOSIS — H0102A Squamous blepharitis right eye, upper and lower eyelids: Secondary | ICD-10-CM | POA: Diagnosis not present

## 2024-02-21 DIAGNOSIS — H43812 Vitreous degeneration, left eye: Secondary | ICD-10-CM | POA: Diagnosis not present

## 2024-02-25 ENCOUNTER — Ambulatory Visit: Payer: Medicare Other | Attending: Cardiovascular Disease | Admitting: Cardiovascular Disease

## 2024-02-25 ENCOUNTER — Encounter: Payer: Self-pay | Admitting: Cardiovascular Disease

## 2024-02-25 DIAGNOSIS — I2102 ST elevation (STEMI) myocardial infarction involving left anterior descending coronary artery: Secondary | ICD-10-CM

## 2024-02-25 DIAGNOSIS — E785 Hyperlipidemia, unspecified: Secondary | ICD-10-CM

## 2024-02-25 DIAGNOSIS — I700 Atherosclerosis of aorta: Secondary | ICD-10-CM

## 2024-02-25 DIAGNOSIS — R001 Bradycardia, unspecified: Secondary | ICD-10-CM | POA: Diagnosis not present

## 2024-02-25 DIAGNOSIS — I358 Other nonrheumatic aortic valve disorders: Secondary | ICD-10-CM | POA: Diagnosis not present

## 2024-02-25 DIAGNOSIS — I10 Essential (primary) hypertension: Secondary | ICD-10-CM | POA: Diagnosis not present

## 2024-02-25 NOTE — Progress Notes (Signed)
 Cardiology Office Note    Date:  02/25/2024   ID:  Timothy Escobar [birth year], MRN [id number]  PCP:  Timothy Bras, MD  Cardiologist:  Timothy Schuller, MD   1 year follow-up evaluation   History of Present Illness:  Timothy Escobar is a 83 y.o. male who sufferred an Anterior STEMI on 08/22/16.  I last saw him on 08/28/2023.  He presents for a 24-month follow-up evaluation.   Timothy Escobar was without cardiac history and developed acute onset of severe substernal chest pressure while he was moving logs in a wheelbarrow which commenced at approximately 5 PM on 08/22/16. EMS arrived within 15 minutes. His ECG in transient showed 11 mm of ST segment elevation in leads V3 through V6 consistent with an acute anterior injury current. A code STEMI was activated and he was brought emergently directly to the catheterization laboratory where I performed emergent catheterization. Catheterization revealed total occlusion of a large proximal LAD with TIMI 0 flow  and a large dominant normal left circumflex vessel and small nondominant RCA. He underwent successful PCI to the LAD, treated with PTCA/DES stenting at the site of 100% occlusion with initial insertion of a 3.016 mm Synergy DES stent.  Once  TIMI-3 flow was established it was evident that there was segmental disease beyond the stented segment of 70 and 80% and tandem stenting with a 2.7524 mm Synergy DES stent was done with post stent dilatation from 3.23 mm in the proximal portion of the proximal stent to 3.05 mm at the distal portion of the distal stent. LVEDP was 13 mm Hg and the door to balloon time from arrival was 19 minutes. DTB from chest pain onset prior to EMS arrival was < 1 hour.  He ultimately completely resolved his 11 mm ST elevation, ECG normalized, and subsequent echo showed completely normal LV fx with EF 60-65% without any wall motion abnormality! He was discharged 2 days later.  Since his event he has remained  asymptomatic.  He completed participation in cardiac rehabilitation.  He is participating in cardiac rehab.  When I last saw him, he was remaining completely asymptomatic.  Over the past 6 months.  He continues to be asymptomatic.  He is active.  He splits wood.  He continues to sing in the [facility] [employer] choir.  Laboratory on October 03, 2017 showed a total cholesterol 119, HDL 60, LDL 49, triglycerides 51.  Hemoglobin A1c was 5.5.  Creatine 0.88.  TSH 1.05.  Normal LFTs.   I saw him in July 2019 at which time he was continuing to be asymptomatic from a cardiovascular standpoint.  He remains active cutting his grass, cutting firewood, and walking.  He continues to sing in the [employer] choir.  H underwent  an echo Doppler study on Mar 05, 2018.  EF was 60 to 65% and he had normal diastolic parameters.  There was trivial AR, mild Timothy, trivial TR, and mild PR.    He underwent evaluation by Timothy Escobar for right upper quadrant abdominal stabbing pain.  CT of his abdomen on July 19, 2018 revealed evidence for very mild fatty infiltration of the liver.  His pancreas was unremarkable.  He was found to have a small accessory spleen.  There was mild tension of the right renal collecting system of uncertain significance.  There was no stone or obstructing mass.  Aortic atherosclerosis was present.  He did not have any adenopathy.  He continues to be very active.  He continued to sing for the [facility] choir.  Recent laboratory on October 28, 2018 continue to show excellent lipid studies with total cholesterol 125, HDL 65, LDL 49, and triglycerides 53.  He denied chest pain, PND, orthopnea or  Palpitations.  When I saw him in November 2020 he continued to do exceptionally well.   Unfortunately with the COVID-19 pandemic, he has been unable to perform.  However he is practicing singing daily.  He remained active on his property and denied any exertional shortness  of breath or chest pain.  His blood pressure at home typically runs in the 120s over 70s.  He was walking at least 2 miles 4 days/week.  He has not had recent laboratory.    I  saw him on July 01, 2020 and over the past year he had remained completely asymptomatic.  He continued  to be very active working on his property.  This past week he actually took down a small tree.  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  He denies chest pain PND orthopnea, palpitations, or any myalgias.  He just had lab work done several days ago prior to this office visit on June 24, 2020 which is excellent.  Creatinine 1.0.  Glucose 95.  LFTs normal.  Hemoglobin 14.1, Magick 42.4.  TSH normal 1.47.  Lipid studies excellent with total cholesterol 130, HDL 61, LDL 59 on atorvastatin  80 mg.  He continues to be on aspirin  and low-dose Brilinta  per Pegasys trial data.  He is on low-dose lisinopril  2.5 mg.    Since I last saw him, he was evaluated by his primary physician Timothy Escobar.  He has been on Prozac  for depression which has been well controlled and his prescription was renewed.  I saw him on August 19, 2021.  He continues to be stable from a cardiovascular standpoint and denied any chest pain or shortness of breath.  He continues to be active singing with the [employer] [place] and is previously also had some for the [employer] corral plus [employer].  He has learned some additional baritone/base solo contados from [employer] [phone]) and has accomplished other additional difficult pieces.  He remains active working in his yard.  He denies chest pain or palpitations.  He denies any exertional dyspnea.  He continues to be on lisinopril  2.5 mg daily, atorvastatin  80 mg, and DAPT with aspirin  and low-dose Brilinta  at 60 mg twice daily.  He continues to be on fluoxetine  20 mg daily.    When I saw him on July 25, 2022 he remained entirely  asymptomatic.  He  recently downsized his house and now is living in a smaller 1400 square foot house on a single level.  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  He practices every day.  He denies chest pain PND or.  He denies change in exercise tolerance.  He recently saw Timothy Escobar in July 2023.  Laboratory on July 19, 2022 showed total cholesterol 117 HDL 60 LDL and triglycerides 47 on atorvastatin  80 mg.  He continues to be on aspirin  and low-dose Brilinta  60 mg twice a day.  He is on amlodipine  5 mg hypertension and fluoxetine  per Dr. Ester Helms.  He underwent an echo Doppler study yesterday July 24, 2002 which showed an EF  60 to 65%, mild LVH, and he had normal diastolic parameters.  There was mild Timothy and mild aortic sclerosis without stenosis with mild aortic regurgitation.    I last saw him on August 28, 2023.  Timothy Escobar has continued to do exceptionally well.  He continues to sing regularly and is practicing numerous pieces by Anthon Kins.  He remains active.  He has been caring for his wife more recently since she has developed chronic inflammatory demyelinating polyneuropathy.  He continues to be on amlodipine  2.5 mg, for blood pressure.  He has been maintained on low-dose aspirin  and Brilinta  at 60 mg twice a day for DAPT.  He continues to be on atorvastatin  80 mg daily for hyperlipidemia.  Laboratory on May 07, 2023 showed total cholesterol 127, HDL 64, LDL 52, and triglycerides at 51.  Creatinine is stable at 1.0 with potassium 4.6.  He takes fluoxetine  20 mg daily.  During that evaluation, I recommended he undergo a 71-month follow-up echo Doppler study in April 2025.  His echo Doppler study performed on January 28, 2024 showed excellent LV function with EF 65 to 70% without wall motion abnormalities.  He had normal diastolic parameters and strain pattern.  There was mild LA dilation, mild mitral  valve regurgitation, and evidence for aortic sclerosis without stenosis.  Presently, Timothy. Rudesill continues to feel well.  He remains completely asymptomatic.  He is very active doing yard work, he plans to redo his deck, and also paint his house.  He is unaware of any palpitations.  He continues to see Timothy Escobar for primary care.  Laboratory on November 14, 2023 showed total cholesterol 134, HDL 64, LDL 48 and triglycerides 112.  He presents for evaluation.  Past Medical History:  Diagnosis Date   Blood transfusion without reported diagnosis    CAD (coronary artery disease)    a. 08/22/2016 Ant STEMI/PCI: LM nl, LAD 30ost, 100p (3.0x15 Synergy DES & 2.75x24 Synergy DES), LCX large/dominant/nl, RCA nondom, nl;  b. 08/2016 Echo: Ef 60-65%, no rwma.   Cataract    Phreesia 03/29/2020   Depression    Dyslipidemia    Heart murmur    Phreesia 03/29/2020   Myocardial infarction [facility])    Phreesia 03/29/2020   Skin cancer    skin cancer    Past Surgical History:  Procedure Laterality Date   APPENDECTOMY     CARDIAC CATHETERIZATION N/A 08/22/2016   Procedure: Left Heart Cath and Coronary Angiography;  Surgeon: Millicent Ally, MD;  Location: [facility] INVASIVE CV LAB;  Service: Cardiovascular;  Laterality: N/A;   CARDIAC CATHETERIZATION N/A 08/22/2016   Procedure: Coronary Stent Intervention;  Surgeon: Millicent Ally, MD;  Location: [facility] INVASIVE CV LAB;  Service: Cardiovascular;  Laterality: N/A;  MID LAD   COLON SURGERY N/A    Phreesia 03/29/2020   HERNIA REPAIR     parotid gland cyst removal Right 2012   vocal fold polyp removed      Current Medications: Outpatient Medications Prior to Visit  Medication Sig Dispense Refill   amLODipine  (NORVASC ) 5 MG tablet TAKE ONE HALF TABLET BY MOUTH DAILY FOR ONE WEEK, IF SYSTOLIC ABOVE 140 INCREASE TO ONE TABLET 90 tablet 3   aspirin  EC 81 MG EC tablet Take 1 tablet (81 mg total) by mouth daily. 90 tablet 2   atorvastatin  (LIPITOR ) 80 MG tablet TAKE  1 TABLET BY MOUTH IN THE EVENING AT 6 PM 90 tablet 3   calcium -vitamin D  (OSCAL WITH D)  500-200 MG-UNIT per tablet Take 1 tablet by mouth daily. 1,000 of Vit D3     cholecalciferol (VITAMIN D ) 1000 units tablet Take 1,000 Units by mouth daily.     clobetasol cream (TEMOVATE) 0.05 % Apply 1 Application topically 2 (two) times daily.     FLUoxetine  (PROZAC ) 20 MG capsule Take 1 capsule by mouth once daily 90 capsule 0   Ginger Root POWD by Does not apply route. Mixes 1/2 teaspoon of powder into hot tea once daily     Multiple Vitamins-Minerals (MULTIVITAMIN WITH MINERALS) tablet Take 1 tablet by mouth once a week. Centrum Silver     nitroGLYCERIN  (NITROSTAT ) 0.4 MG SL tablet Place 1 tablet (0.4 mg total) under the tongue every 5 (five) minutes x 3 doses as needed for chest pain. 25 tablet 3   ticagrelor  (BRILINTA ) 60 MG TABS tablet Take 1 tablet by mouth twice daily 180 tablet 3   vitamin C (ASCORBIC ACID) 500 MG tablet Take 500 mg by mouth daily.     No facility-administered medications prior to visit.     Allergies:   Patient has no known allergies.   Social History   Socioeconomic History   Marital status: Married    Spouse name: Not on file   Number of children: 2   Years of education: college   Highest education level: Master's degree (e.g., MA, MS, MEng, MEd, MSW, MBA)  Occupational History   Occupation: Retired - Naval architect  Tobacco Use   Smoking status: Former    Current packs/day: 0.00    Types: Cigarettes    Quit date: 01/22/1967    Years since quitting: 57.1   Smokeless tobacco: Never  Vaping Use   Vaping status: Never Used  Substance and Sexual Activity   Alcohol use: Yes    Alcohol/week: 14.0 standard drinks of alcohol    Types: 14 Standard drinks or equivalent per week    Comment: wine: 2 glass daily   Drug use: Never   Sexual activity: Never  Other Topics Concern   Not on file  Social History Narrative   Lives w/ wife in [place]. Both are singers, they  sing with the [employer] in [place] and with [employer], plus the church choir.   Exercise walk/gardening daily for 1 hr plus   Social Drivers of Health   Financial Resource Strain: Low Risk  (02/05/2024)   Overall Financial Resource Strain (CARDIA)    Difficulty of Paying Living Expenses: Not hard at all  Food Insecurity: No Food Insecurity (02/05/2024)   Hunger Vital Sign    Worried About Running Out of Food in the Last Year: Never true    Ran Out of Food in the Last Year: Never true  Transportation Needs: No Transportation Needs (02/05/2024)   PRAPARE - Administrator, Civil Service (Medical): No    Lack of Transportation (Non-Medical): No  Physical Activity: Insufficiently Active (02/05/2024)   Exercise Vital Sign    Days of Exercise per Week: 5 days    Minutes of Exercise per Session: 20 min  Stress: No Stress Concern Present (02/05/2024)   [employer] of Occupational Health - Occupational Stress Questionnaire    Feeling of Stress : Not at all  Social Connections: Moderately Integrated (02/05/2024)   Social Connection and Isolation Panel [NHANES]    Frequency of Communication with Friends and Family: More than three times a week    Frequency of Social Gatherings with Friends and Family: Once a  week    Attends Religious Services: Never    Active Member of Clubs or Organizations: Yes    Attends Engineer, structural: More than 4 times per year    Marital Status: Married     Family History:  The patient's family history includes Cancer in his brother, brother, father, paternal grandmother, sister, and sister; Dementia in his mother; Heart disease in his brother, maternal grandmother, and sister; Hypertension in his sister; Stroke in his maternal grandmother.   ROS General: Negative; No fevers, chills, or night sweats;  HEENT: Negative; No changes in vision or hearing, sinus congestion, difficulty swallowing Pulmonary: Negative; No cough, wheezing,  shortness of breath, hemoptysis Cardiovascular: Negative; No chest pain, presyncope, syncope, palpitations GI: resolved mild right upper quadrant tenderness. GU: Negative; No dysuria, hematuria, or difficulty voiding Musculoskeletal: Negative; no myalgias, joint pain, or weakness Hematologic/Oncology: Negative; no easy bruising, bleeding Endocrine: Negative; no heat/cold intolerance; no diabetes Neuro: Negative; no changes in balance, headaches Skin: Negative; No rashes or skin lesions Psychiatric: Negative; No behavioral problems, depression Sleep: Negative; No snoring, daytime sleepiness, hypersomnolence, bruxism, restless legs, hypnogognic hallucinations, no cataplexy Other comprehensive 14 point system review is negative.   PHYSICAL EXAM:   VS:  BP 126/72   Pulse (!) 52   Ht 5\' 8"  (1.727 m)   Wt 171 lb 3.2 oz (77.7 kg)   SpO2 96%   BMI 26.03 kg/m    Repeat blood pressure by me was 122/70  Wt Readings from Last 3 Encounters:  02/25/24 171 lb 3.2 oz (77.7 kg)  12/03/23 171 lb (77.6 kg)  11/14/23 171 lb 6.4 oz (77.7 kg)    General: Alert, oriented, no distress.  Skin: normal turgor, no rashes, warm and dry HEENT: Normocephalic, atraumatic. Pupils equal round and reactive to light; sclera anicteric; extraocular muscles intact;  Nose without nasal septal hypertrophy Mouth/Parynx benign; Mallinpatti scale 3 Neck: No JVD, no carotid bruits; normal carotid upstroke Lungs: clear to ausculatation and percussion; no wheezing or rales Chest wall: without tenderness to palpitation Heart: PMI not displaced, RRR, s1 s2 normal, 2/6 systolic murmur along the left sternal border and apex, no diastolic murmur, no rubs, gallops, thrills, or heaves Abdomen: soft, nontender; no hepatosplenomehaly, BS+; abdominal aorta nontender and not dilated by palpation. Back: no CVA tenderness Pulses 2+ Musculoskeletal: full range of motion, normal strength, no joint deformities Extremities: no clubbing  cyanosis or edema, Homan's sign negative  Neurologic: grossly nonfocal; Cranial nerves grossly wnl Psychologic: Normal mood and affect    Studies/Labs Reviewed:   EKG Interpretation Date/Time:  Monday Feb 25 2024 11:21:47 EDT Ventricular Rate:  52 PR Interval:  206 QRS Duration:  94 QT Interval:  438 QTC Calculation: 407 R Axis:   61  Text Interpretation: Sinus bradycardia When compared with ECG of 28-Aug-2023 10:38, No significant change was found Confirmed by Timothy Escobar ([id number]) on 02/25/2024 5:44:35 PM    August 28, 2023 ECG (independently read by me): Normal sinus rhythm at 61 bpm.  July 25, 2022 ECG (independently read by me): Sinus bradycardia 56 bpm.  No ST segment changes.  August 19, 2021 ECG (independently read by me):  NSR at 60, No ST changes, PR 200 msec, no ectopy  July 01, 2020 ECG (independently read by me):   NSR at 62; No ectopy, normal intervals  November 2020 EKG:  EKG is ordered today. Sinus Bradycardia at 52; Mild early repolarization changes; PR 204 msec  November 04, 2018 ECG (independently read by me):  Sinus bradycardia at 57 bpm.  Normal ECG.  No ectopy.  No ST segment changes.  PR interval 206 msec;QTc 412  July 2019 ECG (independently read by me): Sinius bradycardia 59 bpm.  No ST segment changes.  No ECG evidence for prior ACS.  December 2018 ECG (independently read by me): Normal sinus rhythm at 61 bpm.  No ST segment changes.  No ectopy.  Normal intervals.  June 2018 ECG (independently read by me): Sinus bradycardia 51 bpm.  No ST segment changes.  Normal intervals.  February 2018 ECG (independently read by me): Sinus bradycardia 53 bpm.  Normal ECG.  Early repolarization.  No evidence for prior infarction.  Recent Labs:    Latest Ref Rng & Units 11/14/2023    3:43 PM 05/07/2023   11:06 AM 07/19/2022    8:48 AM  BMP  Glucose 70 - 99 mg/dL 540  981  92   BUN 6 - 23 mg/dL 16  15  16    Creatinine 0.40 - 1.50 mg/dL 1.91  4.78  2.95    BUN/Creat Ratio 10 - 24   18   Sodium 135 - 145 mEq/L 142  140  141   Potassium 3.5 - 5.1 mEq/L 4.4  4.6  4.4   Chloride 96 - 112 mEq/L 106  106  106   CO2 19 - 32 mEq/L 29  29  26    Calcium  8.4 - 10.5 mg/dL 9.0  9.4  8.9         Latest Ref Rng & Units 11/14/2023    3:43 PM 05/07/2023   11:06 AM 07/19/2022    8:48 AM  Hepatic Function  Total Protein 6.0 - 8.3 g/dL 6.4  6.7  6.0   Albumin 3.5 - 5.2 g/dL 4.4  4.3  4.3   AST 0 - 37 U/L 23  23  21    ALT 0 - 53 U/L 19  20  18    Alk Phosphatase 39 - 117 U/L 78  71  83   Total Bilirubin 0.2 - 1.2 mg/dL 0.6  1.2  0.8        Latest Ref Rng & Units 05/07/2023   11:06 AM 07/19/2022    8:48 AM 10/27/2021   11:00 AM  CBC  WBC 4.0 - 10.5 K/uL 3.8  4.5  4.5   Hemoglobin 13.0 - 17.0 g/dL 62.1  30.8  65.7   Hematocrit 39.0 - 52.0 % 43.7  41.6  43.2   Platelets 150.0 - 400.0 K/uL 174.0  157  158.0    Lab Results  Component Value Date   MCV 98.0 05/07/2023   MCV 97 07/19/2022   MCV 95.6 10/27/2021   Lab Results  Component Value Date   TSH 1.040 07/19/2022   Lab Results  Component Value Date   HGBA1C 5.5 08/23/2016     BNP    Component Value Date/Time   BNP 164.0 (H) 08/23/2016 0200    ProBNP No results found for: "PROBNP"   Lipid Panel     Component Value Date/Time   CHOL 134 11/14/2023 1543   CHOL 117 07/19/2022 0848   TRIG 112.0 11/14/2023 1543   HDL 64.20 11/14/2023 1543   HDL 60 07/19/2022 0848   CHOLHDL 2 11/14/2023 1543   VLDL 22.4 11/14/2023 1543   LDLCALC 48 11/14/2023 1543   LDLCALC 46 07/19/2022 0848     RADIOLOGY: ECHOCARDIOGRAM COMPLETE Result Date: 01/28/2024    ECHOCARDIOGRAM REPORT   Patient Name:   Timothy Escobar  [facility] Date of Exam: 01/28/2024 Medical Rec #:  [id number]        Height:       68.0 in Accession #:    [id number]       Weight:       171.0 lb Date of Birth:  1941-03-23        BSA:          1.912 m Patient Age:    83 years         BP:           122/60 mmHg Patient Gender: M                HR:            54 bpm. Exam Location:  [address] Procedure: 2D Echo, 3D Echo and Strain Analysis (Both Spectral and Color Flow            Doppler were utilized during procedure). Indications:    I35.0 Nonrheumatic aortic (valve) stenosis; I34.2 Nonrheumatic                 mitral (valve) stenosis  History:        Patient has prior history of Echocardiogram examinations, most                 recent 07/24/2022. CAD and Previous Myocardial Infarction,                 Signs/Symptoms:Murmur; Risk Factors:Dyslipidemia and Former                 Smoker. Bradycardia. Aortic atherosclerosis. Aortic valve                 sclerosis.  Sonographer:    Mylinda Asa RCS Referring Phys: [id number] Briannah Lona A Jazzlene Huot IMPRESSIONS  1. Left ventricular ejection fraction, by estimation, is 65 to 70%. The left ventricle has normal function. The left ventricle has no regional wall motion abnormalities. Left ventricular diastolic parameters were normal. The average left ventricular global longitudinal strain is -19.8 %. The global longitudinal strain is normal.  2. Right ventricular systolic function is normal. The right ventricular size is normal.  3. Left atrial size was mildly dilated.  4. Mild mitral valve regurgitation.  5. The aortic valve is tricuspid. Aortic valve regurgitation is not visualized. Aortic valve sclerosis is present, with no evidence of aortic valve stenosis. FINDINGS  Left Ventricle: Left ventricular ejection fraction, by estimation, is 65 to 70%. The left ventricle has normal function. The left ventricle has no regional wall motion abnormalities. The average left ventricular global longitudinal strain is -19.8 %. Strain was performed and the global longitudinal strain is normal. The left ventricular internal cavity size was normal in size. There is no left ventricular hypertrophy. Left ventricular diastolic parameters were normal. Right Ventricle: The right ventricular size is normal. Right vetricular wall thickness was not  assessed. Right ventricular systolic function is normal. Left Atrium: Left atrial size was mildly dilated. Right Atrium: Right atrial size was normal in size. Pericardium: There is no evidence of pericardial effusion. Mitral Valve: There is mild thickening of the mitral valve leaflet(s). Mild mitral valve regurgitation. Tricuspid Valve: The tricuspid valve is normal in structure. Tricuspid valve regurgitation is trivial. Aortic Valve: The aortic valve is tricuspid. Aortic valve regurgitation is not visualized. Aortic valve sclerosis is present, with no evidence of aortic valve stenosis. Pulmonic Valve: The pulmonic valve was normal in structure. Pulmonic valve regurgitation is mild. Aorta: The  aortic root and ascending aorta are structurally normal, with no evidence of dilitation. IAS/Shunts: No atrial level shunt detected by color flow Doppler.  LEFT VENTRICLE PLAX 2D LVIDd:         3.70 cm   Diastology LVIDs:         2.30 cm   LV e' medial:    9.14 cm/s LV PW:         0.80 cm   LV E/e' medial:  8.3 LV IVS:        1.10 cm   LV e' lateral:   9.90 cm/s LVOT diam:     2.00 cm   LV E/e' lateral: 7.6 LV SV:         99 LV SV Index:   52        2D Longitudinal Strain LVOT Area:     3.14 cm  2D Strain GLS (A4C):   -21.3 %                          2D Strain GLS (A3C):   -19.7 %                          2D Strain GLS (A2C):   -18.4 %                          2D Strain GLS Avg:     -19.8 %                           3D Volume EF:                          3D EF:        67 %                          LV EDV:       139 ml                          LV ESV:       46 ml                          LV SV:        93 ml RIGHT VENTRICLE RV Basal diam:  3.80 cm RV S prime:     17.40 cm/s TAPSE (M-mode): 3.4 cm RVSP:           37.1 mmHg LEFT ATRIUM             Index        RIGHT ATRIUM           Index LA diam:        4.30 cm 2.25 cm/m   RA Pressure: 3.00 mmHg LA Vol (A2C):   83.8 ml 43.83 ml/m  RA Area:     23.10 cm LA Vol (A4C):   71.0 ml  37.13 ml/m  RA Volume:   70.20 ml  36.71 ml/m LA Biplane Vol: 78.5 ml 41.05 ml/m  AORTIC VALVE LVOT Vmax:   124.00 cm/s LVOT Vmean:  84.400 cm/s LVOT VTI:    0.314 m  AORTA Ao Root diam: 3.50 cm Ao Asc diam:  3.80 cm MITRAL  VALVE               TRICUSPID VALVE MV Area (PHT): 3.03 cm    TR Peak grad:   34.1 mmHg MV Decel Time: 250 msec    TR Vmax:        292.00 cm/s MV E velocity: 75.50 cm/s  Estimated RAP:  3.00 mmHg MV A velocity: 80.20 cm/s  RVSP:           37.1 mmHg MV E/A ratio:  0.94                            SHUNTS                            Systemic VTI:  0.31 m                            Systemic Diam: 2.00 cm Ola Berger MD Electronically signed by Ola Berger MD Signature Date/Time: 01/28/2024/3:32:27 PM    Final      ASSESSMENT:    1. Coronary artery disease involving native coronary artery of native heart with other form of angina pectoris (HCC)   2. Sinus bradycardia   3. Essential hypertension   4. Aortic atherosclerosis (HCC)   5. Aortic valve sclerosis     PLAN:  Timothy Escobar is a very pleasant 83year-old Caucasian male who develeoped an ACS/Anterior STEMI with 11 mm ST elevation precordially on Halloween 2017. Fortunately he acted quickly and was prompty brought to [facility] cath lab where I performed emergent cath/PCI .  He had an DTB time of 19 minutes from arrival to the lab and his chest pain duration was less than 60 minutes. Due to disease beyond the initial occlusion he underwent tandem DES stenting with an excellent result.  He has been demonstrated to have complete myocardial salvage and his ECG is normal.  His echo Doppler study in May 2019 continued to show an EF of 60 to 65% without wall motion abnormalities.  His aortic valve is moderately thickened and moderately calcified but excursion was normal.  He had trivial AR, mild Timothy, trivial TR and mild PR.  There was increased thickness of the interatrial septum consistent with lipomatous hypertrophy.  There was mild/moderate LA  dilation.  His medical case was interviewed on [employer] Knudsen on his birthday December 09, 2016.  His echo from July 24, 2022 revealed normal LV function with mild LVH.  He had normal diastolic function.  There was mild aortic sclerosis, mild Timothy, and AR without aortic stenosis.  He recently underwent a follow-up echo on January 28, 2024 which remained stable with normal LV function with EF 65 to 70%, mild LA dilation, mild Timothy, and aortic valve sclerosis without stenosis.  Presently, he remains entirely asymptomatic.  He continues to be on low-dose amlodipine  at 2.5 mg.  He has experienced some ankle swelling.  I will initiate low-dose HCTZ 12.5 mg for ankle swelling.  He has been maintained on low-dose aspirin  and low-dose Brilinta  60 mg twice a day per Pegasys trial data and is doing well without bleeding.  Most recent laboratory on November 14, 2023 showed excellent lipid studies with total cholesterol 134, HDL 64, LDL 48 and triglycerides 112.  I will check LP(a) today.  He is active doing extensive yard work, and plans to redo his deck.  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  He has been caring for his wife who has a chronic inflammatory demyelinating polyneuropathy.  He is aware of my upcoming retirement and is very appreciative of my care.  It has been a pleasure to take care of him since our initial encounter in the setting of an extensive LAD STEMI with 11 mm of ST segment elevation.  He had complete recovery following early intervention.  With his cardiac murmur and aortic sclerosis, I will transition him to the care of Dr. Arnoldo Lapping who will follow him closely in the event of progressive aortic valve disease.  I will schedule follow-up evaluation with him in 1 year or sooner as needed.   Medication Adjustments/Labs and Tests Ordered: Current medicines are reviewed at length with the patient today.  Concerns regarding  medicines are outlined above.  Medication changes, Labs and Tests ordered today are listed in the Patient Instructions below.  There are no Patient Instructions on file for this visit.   Signed, Timothy Schuller, MD , [facility] 02/25/2024 12:00 PM    [facility] [address], [place], [place]  [address] Phone: [phone]

## 2024-02-25 NOTE — Patient Instructions (Signed)
 Medication Instructions:  Your physician recommends that you continue on your current medications as directed. Please refer to the Current Medication list given to you today. *If you need a refill on your cardiac medications before your next appointment, please call your pharmacy*  Lab Work: Lp(a) TODAY - please have this completed at [facility] on the first floor of our building If you have labs (blood work) drawn today and your tests are completely normal, you will receive your results only by: MyChart Message (if you have MyChart) OR A paper copy in the mail If you have any lab test that is abnormal or we need to change your treatment, we will call you to review the results.   Follow-Up: At [facility], you and your health needs are our priority.  As part of our continuing mission to provide you with exceptional heart care, our providers are all part of one team.  This team includes your primary Cardiologist (physician) and Advanced Practice Providers or APPs (Physician Assistants and Nurse Practitioners) who all work together to provide you with the care you need, when you need it.  Your next appointment:   9 month(s)  Provider:   Arnoldo Lapping, MD

## 2024-02-26 DIAGNOSIS — I25118 Atherosclerotic heart disease of native coronary artery with other forms of angina pectoris: Secondary | ICD-10-CM | POA: Diagnosis not present

## 2024-02-26 DIAGNOSIS — I10 Essential (primary) hypertension: Secondary | ICD-10-CM | POA: Diagnosis not present

## 2024-02-26 DIAGNOSIS — R001 Bradycardia, unspecified: Secondary | ICD-10-CM | POA: Diagnosis not present

## 2024-02-26 DIAGNOSIS — I700 Atherosclerosis of aorta: Secondary | ICD-10-CM | POA: Diagnosis not present

## 2024-02-26 DIAGNOSIS — I358 Other nonrheumatic aortic valve disorders: Secondary | ICD-10-CM | POA: Diagnosis not present

## 2024-02-27 LAB — LIPOPROTEIN A (LPA): Lipoprotein (a): 55.9 nmol/L (ref <75.0)

## 2024-03-06 ENCOUNTER — Ambulatory Visit: Payer: Self-pay

## 2024-03-31 ENCOUNTER — Ambulatory Visit (INDEPENDENT_AMBULATORY_CARE_PROVIDER_SITE_OTHER): Admitting: Family Medicine

## 2024-03-31 ENCOUNTER — Encounter: Payer: Self-pay | Admitting: Family Medicine

## 2024-03-31 VITALS — BP 110/64 | HR 55 | Temp 98.1°F | Wt 171.0 lb

## 2024-03-31 DIAGNOSIS — M7052 Other bursitis of knee, left knee: Secondary | ICD-10-CM | POA: Diagnosis not present

## 2024-03-31 DIAGNOSIS — M25562 Pain in left knee: Secondary | ICD-10-CM | POA: Diagnosis not present

## 2024-03-31 DIAGNOSIS — R490 Dysphonia: Secondary | ICD-10-CM | POA: Diagnosis not present

## 2024-03-31 DIAGNOSIS — R052 Subacute cough: Secondary | ICD-10-CM | POA: Diagnosis not present

## 2024-03-31 MED ORDER — AZITHROMYCIN 250 MG PO TABS: ORAL_TABLET | ORAL | 0 refills | Status: AC

## 2024-03-31 MED ORDER — AZITHROMYCIN 250 MG PO TABS: ORAL_TABLET | ORAL | 0 refills | Status: DC

## 2024-03-31 NOTE — Patient Instructions (Addendum)
 If the cough, drainage and voice continue to improve, no new meds needed. If you notice continued discolored phlegm, or starts to become more productive again - can fill the printed antibiotic. Follow up if worsening.   For knee - please have xray at [facility].  Diclofenac  topical up to 4 times per day for the next week or two as needed for knee pain.  This is over the counter.  No rush to pull fluid out of the bursa below your knee - that appeared similar at last visit. If pain in the swollen area, let me know.  Recheck in 1 month, sooner if anything is worsening.  Please let me know if there are questions.   [facility] or xray: Walk in 8:30-4:30 during weekdays, no appointment needed [address]  [place], [place] [address]

## 2024-03-31 NOTE — Progress Notes (Signed)
 Subjective:  Patient ID: RIOT WATERWORTH, male    DOB: 1940-11-15  Age: 83 y.o. MRN: [id number]  CC:  Chief Complaint  Patient presents with   lump on left knee   Cough   Hoarse    HPI LC JOYNT presents for   L knee swelling: Evaluated for swelling below the kneecap in January, had been present for a few months but not sore.  Symptoms have improved intermittently, diagnosed with likely infrapatellar bursitis, possibly due to repetitive stair climbing.  Activity modification discussed at that time as minimal symptoms with RTC precautions.  Swelling has remained. No pain. Not increased.no redness.  Some soreness with walking down stairs past few weeks inside knee - not in area of swelling.  Not locking/giving way. Not limiting activity. Staying active.   Tx: none  Lab Results  Component Value Date   CREATININE 0.92 11/14/2023     Cough, hoarseness About 7-8  weeks ago he and his spouse had respiratory illness. Suspected virus/cold. Cough, congestion. No fever. Some initial hoarseness - difficulty speaking. He does sing - unable to sing at that time.  Past week starting to get better.  Some yellow phlegm/postnasal drip this whole time. No face pain or HA. Less congestion now, none this am.  No heartburn.  Able to talk normally, has tied to sing a few times last week - not quite ready -plans to try again today. Upper range issues prior.    Normal QTC of 407 on May 5th EKG.   Tx: mucinex   History Patient Active Problem List   Diagnosis Date Noted   Sinus bradycardia 01/23/2017   Dyslipidemia    CAD (coronary artery disease)    Status post coronary artery stent placement    Bradycardia    Hypokalemia    Hyperlipidemia with target LDL less than 70    STEMI (ST elevation myocardial infarction) (HCC) 08/22/2016   Acute ST elevation myocardial infarction (STEMI) involving left anterior descending (LAD) coronary artery (HCC) 08/22/2016   Hip pain 09/28/2012    Chronic depression 09/28/2012   Past Medical History:  Diagnosis Date   Blood transfusion without reported diagnosis    CAD (coronary artery disease)    a. 08/22/2016 Ant STEMI/PCI: LM nl, LAD 30ost, 100p (3.0x15 Synergy DES & 2.75x24 Synergy DES), LCX large/dominant/nl, RCA nondom, nl;  b. 08/2016 Echo: Ef 60-65%, no rwma.   Cataract    Phreesia 03/29/2020   Depression    Dyslipidemia    Heart murmur    Phreesia 03/29/2020   Myocardial infarction [facility])    Phreesia 03/29/2020   Skin cancer    skin cancer   Past Surgical History:  Procedure Laterality Date   APPENDECTOMY     CARDIAC CATHETERIZATION N/A 08/22/2016   Procedure: Left Heart Cath and Coronary Angiography;  Surgeon: Millicent Ally, MD;  Location: [facility] INVASIVE CV LAB;  Service: Cardiovascular;  Laterality: N/A;   CARDIAC CATHETERIZATION N/A 08/22/2016   Procedure: Coronary Stent Intervention;  Surgeon: Millicent Ally, MD;  Location: [facility] INVASIVE CV LAB;  Service: Cardiovascular;  Laterality: N/A;  MID LAD   COLON SURGERY N/A    Phreesia 03/29/2020   HERNIA REPAIR     parotid gland cyst removal Right 2012   vocal fold polyp removed     No Known Allergies Prior to Admission medications   Medication Sig Start Date End Date Taking? Authorizing Provider  amLODipine  (NORVASC ) 5 MG tablet TAKE ONE HALF TABLET BY MOUTH DAILY  FOR ONE WEEK, IF SYSTOLIC ABOVE 140 INCREASE TO ONE TABLET 10/15/23  Yes Millicent Ally, MD  aspirin  EC 81 MG EC tablet Take 1 tablet (81 mg total) by mouth daily. 08/26/16  Yes Nino Bass, MD  atorvastatin  (LIPITOR ) 80 MG tablet TAKE 1 TABLET BY MOUTH IN THE EVENING AT 6 PM 09/17/23  Yes Millicent Ally, MD  calcium -vitamin D  (OSCAL WITH D) 500-200 MG-UNIT per tablet Take 1 tablet by mouth daily. 1,000 of Vit D3   Yes [provider]  cholecalciferol (VITAMIN D ) 1000 units tablet Take 1,000 Units by mouth daily.   Yes [provider]  clobetasol cream (TEMOVATE) 0.05 % Apply 1  Application topically 2 (two) times daily.   Yes [provider]  FLUoxetine  (PROZAC ) 20 MG capsule Take 1 capsule by mouth once daily 01/11/24  Yes Benjiman Bras, MD  Ginger Root POWD by Does not apply route. Mixes 1/2 teaspoon of powder into hot tea once daily   Yes [provider]  Multiple Vitamins-Minerals (MULTIVITAMIN WITH MINERALS) tablet Take 1 tablet by mouth once a week. Centrum Silver   Yes [provider]  nitroGLYCERIN  (NITROSTAT ) 0.4 MG SL tablet Place 1 tablet (0.4 mg total) under the tongue every 5 (five) minutes x 3 doses as needed for chest pain. 03/03/20  Yes Millicent Ally, MD  ticagrelor  (BRILINTA ) 60 MG TABS tablet Take 1 tablet by mouth twice daily 05/08/23  Yes Millicent Ally, MD  vitamin C (ASCORBIC ACID) 500 MG tablet Take 500 mg by mouth daily.   Yes [provider]   Social History   Socioeconomic History   Marital status: Married    Spouse name: Not on file   Number of children: 2   Years of education: college   Highest education level: Master's degree (e.g., MA, MS, MEng, MEd, MSW, MBA)  Occupational History   Occupation: Retired - Naval architect  Tobacco Use   Smoking status: Former    Current packs/day: 0.00    Types: Cigarettes    Quit date: 01/22/1967    Years since quitting: 57.2   Smokeless tobacco: Never  Vaping Use   Vaping status: Never Used  Substance and Sexual Activity   Alcohol use: Yes    Alcohol/week: 14.0 standard drinks of alcohol    Types: 14 Standard drinks or equivalent per week    Comment: wine: 2 glass daily   Drug use: Never   Sexual activity: Never  Other Topics Concern   Not on file  Social History Narrative   Lives w/ wife in [place]. 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   Exercise walk/gardening daily for 1 hr plus   Social Drivers of Health   Financial Resource Strain: Low Risk  (02/05/2024)   Overall  Financial Resource Strain (CARDIA)    Difficulty of Paying Living Expenses: Not hard at all  Food Insecurity: No Food Insecurity (02/05/2024)   Hunger Vital Sign    Worried About Running Out of Food in the Last Year: Never true    Ran Out of Food in the Last Year: Never true  Transportation Needs: No Transportation Needs (02/05/2024)   PRAPARE - Administrator, Civil Service (Medical): No    Lack of Transportation (Non-Medical): No  Physical Activity: Insufficiently Active (02/05/2024)   Exercise Vital Sign    Days of Exercise per Week: 5 days  Minutes of Exercise per Session: 20 min  Stress: No Stress Concern Present (02/05/2024)   [employer] of Occupational Health - Occupational Stress Questionnaire    Feeling of Stress : Not at all  Social Connections: Moderately Integrated (02/05/2024)   Social Connection and Isolation Panel [NHANES]    Frequency of Communication with Friends and Family: More than three times a week    Frequency of Social Gatherings with Friends and Family: Once a week    Attends Religious Services: Never    Database administrator or Organizations: Yes    Attends Engineer, structural: More than 4 times per year    Marital Status: Married  Catering manager Violence: Not At Risk (02/05/2024)   Humiliation, Afraid, Rape, and Kick questionnaire    Fear of Current or Ex-Partner: No    Emotionally Abused: No    Physically Abused: No    Sexually Abused: No    Review of Systems Per HPI  Objective:   Vitals:   03/31/24 1139  BP: 110/64  Pulse: (!) 55  Temp: 98.1 F (36.7 C)  TempSrc: Oral  SpO2: 95%  Weight: 171 lb (77.6 kg)     Physical Exam Vitals reviewed.  Constitutional:      Appearance: He is well-developed.  HENT:     Head: Normocephalic and atraumatic.     Right Ear: Tympanic membrane, ear canal and external ear normal.     Left Ear: Tympanic membrane, ear canal and external ear normal.     Nose: No rhinorrhea.      Comments: Sinuses nontender    Mouth/Throat:     Pharynx: No oropharyngeal exudate or posterior oropharyngeal erythema.  Eyes:     Conjunctiva/sclera: Conjunctivae normal.     Pupils: Pupils are equal, round, and reactive to light.  Cardiovascular:     Rate and Rhythm: Normal rate and regular rhythm.     Heart sounds: Normal heart sounds. No murmur heard. Pulmonary:     Effort: Pulmonary effort is normal.     Breath sounds: Normal breath sounds. No wheezing, rhonchi or rales.  Abdominal:     Palpations: Abdomen is soft.     Tenderness: There is no abdominal tenderness.  Musculoskeletal:     Cervical back: Neck supple.     Comments: Left knee, prominence, soft fluctuant area over tibial tuberosity. Remainder of knee without erythema, effusion, or focal tenderness.  Slight crepitance with flexion and extension but intact range of motion, negative McMurray, Lachman, varus and valgus stress.  Negative patellar compression testing but crepitance noted.  Ambulating without assistive device.  Lymphadenopathy:     Cervical: No cervical adenopathy.  Skin:    General: Skin is warm and dry.     Findings: No rash.  Neurological:     Mental Status: He is alert and oriented to person, place, and time.  Psychiatric:        Behavior: Behavior normal.        Assessment & Plan:  FENIX RORKE is a 83 y.o. male . Subacute cough - Plan: azithromycin  (ZITHROMAX ) 250 MG tablet, DISCONTINUED: azithromycin  (ZITHROMAX ) 250 MG tablet Hoarseness - Plan: azithromycin  (ZITHROMAX ) 250 MG tablet, DISCONTINUED: azithromycin  (ZITHROMAX ) 250 MG tablet  - Likely viral illness with secondary sinusitis.  No sinus pain or headache at this time and symptoms are improving.  Voice also improving.  Option of azithromycin  if he does not continue to improve with the discolored phlegm and voice/cough symptoms.  Lungs are clear,  will hold on imaging at this time.  Option of ENT eval if not continuing to improve but  suspect he will continue to improve.  RTC precautions given.   Infrapatellar bursitis of left knee - Plan: DG Knee Complete 4 Views Left Left medial knee pain - Plan: DG Knee Complete 4 Views Left  - Longstanding likely likely bursitis without recent changes and asymptomatic, nontender over that area.  New discomfort within the knee over the past week or so with descending stairs.  Possible patellofemoral pain component versus degenerative changes and arthritic changes given he localizes some of his discomfort to the medial knee although asymptomatic on exam.  Check imaging, topical Voltaren  gel short-term given potential risks versus benefits of meds and his cardiac history.  Over-the-counter formulation discussed.  Recheck 1 month with RTC precautions given.  Meds ordered this encounter  Medications   DISCONTD: azithromycin  (ZITHROMAX ) 250 MG tablet    Sig: Take 2 tablets on day 1, then 1 tablet daily on days 2 through 5    Dispense:  6 tablet    Refill:  0   azithromycin  (ZITHROMAX ) 250 MG tablet    Sig: Take 2 tablets on day 1, then 1 tablet daily on days 2 through 5    Dispense:  6 tablet    Refill:  0   Patient Instructions  If the cough, drainage and voice continue to improve, no new meds needed. If you notice continued discolored phlegm, or starts to become more productive again - can fill the printed antibiotic. Follow up if worsening.   For knee - please have xray at [facility].  Diclofenac  topical up to 4 times per day for the next week or two as needed for knee pain.  This is over the counter.  No rush to pull fluid out of the bursa below your knee - that appeared similar at last visit. If pain in the swollen area, let me know.  Recheck in 1 month, sooner if anything is worsening.  Please let me know if there are questions.  [facility] or xray: Walk in 8:30-4:30 during weekdays, no appointment needed [address]  [place], [place] [address]     Signed,   Caro Christmas, MD  Primary Care, [facility] 03/31/24 12:19 PM

## 2024-04-01 ENCOUNTER — Ambulatory Visit: Payer: Self-pay | Admitting: Family Medicine

## 2024-04-01 ENCOUNTER — Ambulatory Visit (INDEPENDENT_AMBULATORY_CARE_PROVIDER_SITE_OTHER)
Admission: RE | Admit: 2024-04-01 | Discharge: 2024-04-01 | Disposition: A | Discharge status: Home / Self Care | Source: Ambulatory Visit | Attending: Family Medicine | Admitting: Family Medicine

## 2024-04-01 DIAGNOSIS — M25562 Pain in left knee: Secondary | ICD-10-CM

## 2024-04-01 DIAGNOSIS — M7052 Other bursitis of knee, left knee: Secondary | ICD-10-CM

## 2024-04-28 ENCOUNTER — Ambulatory Visit (INDEPENDENT_AMBULATORY_CARE_PROVIDER_SITE_OTHER): Admitting: Family Medicine

## 2024-04-28 ENCOUNTER — Encounter: Payer: Self-pay | Admitting: Family Medicine

## 2024-04-28 VITALS — BP 122/80 | HR 52 | Ht 68.0 in | Wt 167.6 lb

## 2024-04-28 DIAGNOSIS — M7052 Other bursitis of knee, left knee: Secondary | ICD-10-CM | POA: Diagnosis not present

## 2024-04-28 DIAGNOSIS — M25562 Pain in left knee: Secondary | ICD-10-CM | POA: Diagnosis not present

## 2024-04-28 DIAGNOSIS — R052 Subacute cough: Secondary | ICD-10-CM

## 2024-04-28 DIAGNOSIS — R490 Dysphonia: Secondary | ICD-10-CM | POA: Diagnosis not present

## 2024-04-28 NOTE — Progress Notes (Signed)
 Subjective:  Patient ID: [name], male    DOB: November 11, 1940  Age: 83 y.o. MRN: [id number]  CC:  Chief Complaint  Patient presents with   Knee Pain    1 Month follow up, left knee pain. Notes that pain worsens post working the knee (mostly steps at home)    HPI Timothy Escobar presents for    Left knee pain, swelling Follow-up from June visit.  Previously had thought to have an area of infrapatellar bursitis possibly due to repetitive stair climbing, activity modification discussed earlier this year.  Swelling has remained no pain but did have some soreness walking downstairs for few weeks prior to his June visit, but pain was in the medial knee, not an area of swelling.  No mechanical symptoms and not limiting activity.  He was still active.  Thought to have separate issue, possible patellofemoral pain versus degenerative changes and arthritic changes given the localized discomfort to the medial knee but he was asymptomatic on exam at that time.  Topical Voltaren  short-term recommended with discussion of potential side effects and risks with his cardiac history.  Negative knee XR on 04/01/24.   Swelling under knee still comes and goes, but not painful. Some soreness in knee with coming down stairs - front of knee.   Rebuilding deck, but wears gel knee pads.   Not limiting activity. Did not use voltaren  gel - concern about cardiac history.    Cough, hoarseness Improved after azithromycin . All clear, back to singing, no residual cough or voice change    History Patient Active Problem List   Diagnosis Date Noted   Sinus bradycardia 01/23/2017   Dyslipidemia    CAD (coronary artery disease)    Status post coronary artery stent placement    Bradycardia    Hypokalemia    Hyperlipidemia with target LDL less than 70    STEMI (ST elevation myocardial infarction) (HCC) 08/22/2016   Acute ST elevation myocardial infarction (STEMI) involving left anterior descending (LAD)  coronary artery (HCC) 08/22/2016   Hip pain 09/28/2012   Chronic depression 09/28/2012   Past Medical History:  Diagnosis Date   Blood transfusion without reported diagnosis    CAD (coronary artery disease)    a. 08/22/2016 Ant STEMI/PCI: LM nl, LAD 30ost, 100p (3.0x15 Synergy DES & 2.75x24 Synergy DES), LCX large/dominant/nl, RCA nondom, nl;  b. 08/2016 Echo: Ef 60-65%, no rwma.   Cataract    Phreesia 03/29/2020   Depression    Dyslipidemia    Heart murmur    Phreesia 03/29/2020   Myocardial infarction [facility])    Phreesia 03/29/2020   Skin cancer    skin cancer   Past Surgical History:  Procedure Laterality Date   APPENDECTOMY     CARDIAC CATHETERIZATION N/A 08/22/2016   Procedure: Left Heart Cath and Coronary Angiography;  Surgeon: Timothy DELENA Sor, MD;  Location: [facility] INVASIVE CV LAB;  Service: Cardiovascular;  Laterality: N/A;   CARDIAC CATHETERIZATION N/A 08/22/2016   Procedure: Coronary Stent Intervention;  Surgeon: Timothy DELENA Sor, MD;  Location: [facility] INVASIVE CV LAB;  Service: Cardiovascular;  Laterality: N/A;  MID LAD   COLON SURGERY N/A    Phreesia 03/29/2020   HERNIA REPAIR     parotid gland cyst removal Right 2012   vocal fold polyp removed     No Known Allergies Prior to Admission medications   Medication Sig Start Date End Date Taking? Authorizing Provider  amLODipine  (NORVASC ) 5 MG tablet TAKE ONE HALF TABLET BY  MOUTH DAILY FOR ONE WEEK, IF SYSTOLIC ABOVE 140 INCREASE TO ONE TABLET 10/15/23  Yes Timothy Timothy LABOR, MD  aspirin  EC 81 MG EC tablet Take 1 tablet (81 mg total) by mouth daily. 08/26/16  Yes Timothy Lot, MD  atorvastatin  (LIPITOR ) 80 MG tablet TAKE 1 TABLET BY MOUTH IN THE EVENING AT 6 PM 09/17/23  Yes Timothy Timothy LABOR, MD  calcium -vitamin D  (OSCAL WITH D) 500-200 MG-UNIT per tablet Take 1 tablet by mouth daily. 1,000 of Vit D3   Yes [provider]  cholecalciferol (VITAMIN D ) 1000 units tablet Take 1,000 Units by mouth daily.   Yes [provider]  clobetasol cream (TEMOVATE) 0.05 % Apply 1 Application topically 2 (two) times daily.   Yes [provider]  FLUoxetine  (PROZAC ) 20 MG capsule Take 1 capsule by mouth once daily 01/11/24  Yes Timothy Timothy SAUNDERS, MD  Ginger Root POWD by Does not apply route. Mixes 1/2 teaspoon of powder into hot tea once daily   Yes [provider]  Multiple Vitamins-Minerals (MULTIVITAMIN WITH MINERALS) tablet Take 1 tablet by mouth once a week. Centrum Silver   Yes [provider]  nitroGLYCERIN  (NITROSTAT ) 0.4 MG SL tablet Place 1 tablet (0.4 mg total) under the tongue every 5 (five) minutes x 3 doses as needed for chest pain. 03/03/20  Yes Timothy Timothy LABOR, MD  ticagrelor  (BRILINTA ) 60 MG TABS tablet Take 1 tablet by mouth twice daily 05/08/23  Yes Timothy Timothy LABOR, MD  vitamin C (ASCORBIC ACID) 500 MG tablet Take 500 mg by mouth daily.   Yes [provider]   Social History   Socioeconomic History   Marital status: Married    Spouse name: Not on file   Number of children: 2   Years of education: college   Highest education level: Master's degree (e.g., MA, MS, MEng, MEd, MSW, MBA)  Occupational History   Occupation: Retired - Naval architect  Tobacco Use   Smoking status: Former    Current packs/day: 0.00    Types: Cigarettes    Quit date: 01/22/1967    Years since quitting: 57.3   Smokeless tobacco: Never  Vaping Use   Vaping status: Never Used  Substance and Sexual Activity   Alcohol use: Yes    Alcohol/week: 14.0 standard drinks of alcohol    Types: 14 Standard drinks or equivalent per week    Comment: wine: 2 glass daily   Drug use: Never   Sexual activity: Never  Other Topics Concern   Not on file  Social History Narrative   Lives w/ wife in [place]. Both are singers, they sing with the [employer] in [place] and with [employer], plus the church choir.   Exercise walk/gardening daily for 1 hr plus   Social Drivers of Health    Financial Resource Strain: Low Risk  (04/24/2024)   Overall Financial Resource Strain (CARDIA)    Difficulty of Paying Living Expenses: Not hard at all  Food Insecurity: No Food Insecurity (04/24/2024)   Hunger Vital Sign    Worried About Running Out of Food in the Last Year: Never true    Ran Out of Food in the Last Year: Never true  Transportation Needs: No Transportation Needs (04/24/2024)   PRAPARE - Administrator, Civil Service (Medical): No    Lack of Transportation (Non-Medical): No  Physical Activity: Sufficiently Active (04/24/2024)   Exercise Vital Sign    Days of Exercise per Week: 7 days  Minutes of Exercise per Session: 30 min  Recent Concern: Physical Activity - Insufficiently Active (02/05/2024)   Exercise Vital Sign    Days of Exercise per Week: 5 days    Minutes of Exercise per Session: 20 min  Stress: No Stress Concern Present (04/24/2024)   [employer] of Occupational Health - Occupational Stress Questionnaire    Feeling of Stress: Not at all  Social Connections: Unknown (04/24/2024)   Social Connection and Isolation Panel    Frequency of Communication with Friends and Family: More than three times a week    Frequency of Social Gatherings with Friends and Family: Once a week    Attends Religious Services: Never    Database administrator or Organizations: Patient declined    Attends Banker Meetings: Not on file    Marital Status: Married  Intimate Partner Violence: Not At Risk (02/05/2024)   Humiliation, Afraid, Rape, and Kick questionnaire    Fear of Current or Ex-Partner: No    Emotionally Abused: No    Physically Abused: No    Sexually Abused: No    Review of Systems Per HPI.   Objective:   Vitals:   04/28/24 1138  BP: 122/80  Pulse: (!) 52  SpO2: 95%  Weight: 167 lb 9.6 oz (76 kg)  Height: 5' 8 (1.727 m)     Physical Exam Constitutional:      General: He is not in acute distress.    Appearance: Normal appearance.  He is well-developed.  HENT:     Head: Normocephalic and atraumatic.  Cardiovascular:     Rate and Rhythm: Normal rate.  Pulmonary:     Effort: Pulmonary effort is normal. No respiratory distress.  Musculoskeletal:     Comments: Left knee, skin intact, no erythema, no ecchymosis.  No appreciable joint effusion. Full active range of motion of left knee.  No focal bony tenderness. Localized soft rounded prominent area at the distal patellar tendon, tibial tuberosity.  No warmth or erythema, nontender.  Pain-free McMurray, varus, valgus testing, unable to reproduce pain with Clark's patellar compression testing.  Neurological:     Mental Status: He is alert and oriented to person, place, and time.  Psychiatric:        Mood and Affect: Mood normal.        Assessment & Plan:  Timothy Escobar is a 83 y.o. male . Left knee pain, unspecified chronicity - Plan: Ambulatory referral to Sports Medicine  Infrapatellar bursitis of left knee - Plan: Ambulatory referral to Sports Medicine  Still suspect a component of patellar bursitis and recommended against any walking or significant pressure to knee even with kneepads. Intermittent pain with walking stairs still sounds like a component of patellofemoral pain syndrome, may have some degenerative changes on the undersurface of patella, not imaged on plain x-ray.  Intermittent symptoms and not affecting activity.  Did not try topical Voltaren  due to cardiac history and risk concerns. Will refer to orthopedic sports medicine for another opinion, MSK ultrasound of affected areas.  RTC precautions.  Cough, hoarseness, resolved.  RTC precautions if recurrent.  No orders of the defined types were placed in this encounter.  Patient Instructions  Glad to hear the cough and hoarseness has resolved.  Great news.  I still think the area of swelling is probably a swollen bursa.  Try to avoid repetitive pressure on that knee, even with kneepads that may  cause it to flare.   For the intermittent pain going  downstairs, there still could be some irritation of the patellofemoral joint or possible slight arthritis or cartilage issue.  I will refer you to orthopedic sports medicine, Dr. Burnetta, for another opinion and likely ultrasound of that area for other recommendations.  Take care!    Signed,   Timothy Pines, MD [facility], [facility] 04/28/24 12:45 PM

## 2024-04-28 NOTE — Patient Instructions (Signed)
 Glad to hear the cough and hoarseness has resolved.  Great news.  I still think the area of swelling is probably a swollen bursa.  Try to avoid repetitive pressure on that knee, even with kneepads that may cause it to flare.   For the intermittent pain going downstairs, there still could be some irritation of the patellofemoral joint or possible slight arthritis or cartilage issue.  I will refer you to orthopedic sports medicine, Dr. Burnetta, for another opinion and likely ultrasound of that area for other recommendations.  Take care!

## 2024-05-08 ENCOUNTER — Telehealth: Payer: Self-pay | Admitting: Cardiovascular Disease

## 2024-05-08 MED ORDER — TICAGRELOR 60 MG PO TABS: 60.0000 mg | ORAL_TABLET | Freq: Two times a day (BID) | ORAL | 3 refills | Status: AC

## 2024-05-08 NOTE — Telephone Encounter (Signed)
*  STAT* If patient is at the pharmacy, call can be transferred to refill team.   1. Which medications need to be refilled? (please list name of each medication and dose if known)   ticagrelor  (BRILINTA ) 60 MG TABS tablet     4. Which pharmacy/location (including street and city if local pharmacy) is medication to be sent to? [facility] - [place],  - [address]     5. Do they need a 30 day or 90 day supply? 90

## 2024-05-08 NOTE — Telephone Encounter (Signed)
 RX sent to requested Pharmacy

## 2024-05-09 ENCOUNTER — Encounter: Payer: Self-pay | Admitting: Sports Medicine

## 2024-05-09 ENCOUNTER — Ambulatory Visit: Admitting: Sports Medicine

## 2024-05-09 ENCOUNTER — Other Ambulatory Visit: Payer: Self-pay

## 2024-05-09 DIAGNOSIS — G8929 Other chronic pain: Secondary | ICD-10-CM | POA: Diagnosis not present

## 2024-05-09 DIAGNOSIS — M25562 Pain in left knee: Secondary | ICD-10-CM

## 2024-05-09 DIAGNOSIS — M7052 Other bursitis of knee, left knee: Secondary | ICD-10-CM

## 2024-05-09 NOTE — Progress Notes (Signed)
 Patient says that he has had a bump on the front of his knee for about 3 months now. He says that it is not tender, and it will go away if he kneels on the left knee. He says that it returns later that day. He is very active, and does have some anterior knee pain as well, particularly with stairs. He has had pain both going up and going down the stairs. He has not done anything to treat the knee, as he says it does not bother him enough to need medicine and he prefers not to use Voltaren  gel.

## 2024-05-09 NOTE — Progress Notes (Signed)
 Timothy Escobar - 83 y.o. male MRN [id number]  Date of birth: 30-Jul-1941  Office Visit Note: Visit Date: 05/09/2024 PCP: Levora Reyes SAUNDERS, MD Referred by: Levora Reyes SAUNDERS, MD  Subjective: Chief Complaint  Patient presents with   Left Knee - Pain   HPI: Timothy Escobar is a very pleasant 83 y.o. male who presents today for left anterior knee swelling and intermittent anterior knee pain.  He has noticed a bump and some swelling overlying the anterior knee for the past 3 months or so.  Denies any specific trauma or inciting event.  There is no tenderness, he does state that this will fluctuate in size getting smaller and bigger depending on activity.  He is very active and does get down on his knees to play with his cat, etc.  He saw his primary physician, Dr. Levora who discussed further evaluation with ultrasound and other diagnostic workup.  Aside from this, he does occasionally have some anterior knee pain as well which is worse going up and down steps.  Years ago he used to run up and down the steps for exercise, he no longer does this given the discomfort but is still able to walk and be active without restriction.  Has used Voltaren  gel in the past, but prefers not to use medication and does not feel that he needs this.  Pertinent ROS were reviewed with the patient and found to be negative unless otherwise specified above in HPI.   Assessment & Plan: Visit Diagnoses:  1. Infrapatellar bursitis of left knee   2. Chronic pain of left knee   3. Patellofemoral arthralgia of left knee    Plan: Impression is twofold conditions: He does have evidence of aseptic infrapatellar bursitis of the left knee which was visualized on ultrasound.  Discussed with him we can simply continue to monitor, as no intervention such as aspiration or injection is needed since this is not bothering him.  He also has a degree of patellofemoral arthralgia with mild cartilage loss underlying the patellofemoral  joint and symptoms worse with going up and down steps.  This is not significantly interfering with his activity and I would like him to remain active. For both of these conditions, he may use neoprene knee compression sleeve and topical Voltaren  gel only if needed.  If for some reason his swelling significantly increases or this is becoming painful or red, he will let me know.  Otherwise he will continue activity as tolerated and follow-up as needed  Follow-up: Return if symptoms worsen or fail to improve.   Meds & Orders: No orders of the defined types were placed in this encounter.   Orders Placed This Encounter  Procedures   US  Extrem Low Left Ltd     Procedures: No procedures performed      Clinical History: No specialty comments available.  He reports that he quit smoking about 57 years ago. His smoking use included cigarettes. He has never used smokeless tobacco. No results for input(s): HGBA1C, LABURIC in the last 8760 hours.  Objective:    Physical Exam  Gen: Well-appearing, in no acute distress; non-toxic CV: Well-perfused. Warm.  Resp: Breathing unlabored on room air; no wheezing. Psych: Fluid speech in conversation; appropriate affect; normal thought process  Ortho Exam - Left knee: There is soft tissue swelling with mild fluctuance overlying the infrapatellar bursa.  There is no surrounding redness or warmth.  This is not tender to touch.  Range of motion about the  knee is preserved from 0-130 degrees.  There is mild pain with patellar grind test.  Ligamentously is intact.  Good muscle strength and preservation of the quadriceps. SLR intact.  Imaging:      US  Extrem Low Left Ltd Narrative: Limited musculoskeletal ultrasound of the left knee was performed today.   Evaluation of the patellar tendon shows proper insertion without evidence  of tearing from the superior patella to the inferior tibial tubercle.   There is evidence of hypoechoic fluid that is  well-circumscribed superior  to the patellar tendon in the superficial infrapatellar bursa.  This is  fluctuant with sono palpation.  Scattered calcification within the  hypoechoic fluid.  No significant hyperemia or Doppler uptake.  Tibial  tubercle visualized without cortical regularity.  There is no knee joint  effusion present. Impression: Appearance of a septic infrapatellar bursitis  _________________________________________________________  *Independent review and interpretation of left knee x-ray from 04/01/2024 was reviewed by myself today.  There is mild patellofemoral arthritic change with a small spur off the inferior patella.  There is a mild soft tissue prominence overlying the tibial tubercle.  No acute fracture or significant arthritic change about the knee.  DG Knee Complete 4 Views Left CLINICAL DATA:  Pain.  EXAM: LEFT KNEE - COMPLETE 4 VIEW  COMPARISON:  None Available.  FINDINGS: No evidence of fracture, dislocation, or joint effusion. No evidence of arthropathy or other focal bone abnormality. Soft tissues are unremarkable.  IMPRESSION: Negative.  Electronically Signed   By: Fonda Field M.D.   On: 04/01/2024 10:35  Past Medical/Family/Surgical/Social History: Medications & Allergies reviewed per EMR, new medications updated. Patient Active Problem List   Diagnosis Date Noted   Sinus bradycardia 01/23/2017   Dyslipidemia    CAD (coronary artery disease)    Status post coronary artery stent placement    Bradycardia    Hypokalemia    Hyperlipidemia with target LDL less than 70    STEMI (ST elevation myocardial infarction) (HCC) 08/22/2016   Acute ST elevation myocardial infarction (STEMI) involving left anterior descending (LAD) coronary artery (HCC) 08/22/2016   Hip pain 09/28/2012   Chronic depression 09/28/2012   Past Medical History:  Diagnosis Date   Blood transfusion without reported diagnosis    CAD (coronary artery disease)    a.  08/22/2016 Ant STEMI/PCI: LM nl, LAD 30ost, 100p (3.0x15 Synergy DES & 2.75x24 Synergy DES), LCX large/dominant/nl, RCA nondom, nl;  b. 08/2016 Echo: Ef 60-65%, no rwma.   Cataract    Phreesia 03/29/2020   Depression    Dyslipidemia    Heart murmur    Phreesia 03/29/2020   Myocardial infarction [facility])    Phreesia 03/29/2020   Skin cancer    skin cancer   Family History  Problem Relation Age of Onset   Dementia Mother    Cancer Father        skin   Heart disease Maternal Grandmother    Stroke Maternal Grandmother    Cancer Sister        breast (pt did not specific L or R)   Cancer Brother        bladder and pancreatic   Heart disease Brother    Cancer Brother    Heart disease Sister    Cancer Sister    Hypertension Sister    Cancer Paternal Grandmother    Past Surgical History:  Procedure Laterality Date   APPENDECTOMY     CARDIAC CATHETERIZATION N/A 08/22/2016   Procedure: Left Heart Cath  and Coronary Angiography;  Surgeon: Debby DELENA Sor, MD;  [facility] INVASIVE CV LAB;  Service: Cardiovascular;  Laterality: N/A;   CARDIAC CATHETERIZATION N/A 08/22/2016   Procedure: Coronary Stent Intervention;  Surgeon: Debby DELENA Sor, MD;  Location: [facility] INVASIVE CV LAB;  Service: Cardiovascular;  Laterality: N/A;  MID LAD   COLON SURGERY N/A    Phreesia 03/29/2020   HERNIA REPAIR     parotid gland cyst removal Right 2012   vocal fold polyp removed     Social History   Occupational History   Occupation: Retired - Naval architect  Tobacco Use   Smoking status: Former    Current packs/day: 0.00    Types: Cigarettes    Quit date: 01/22/1967    Years since quitting: 57.3   Smokeless tobacco: Never  Vaping Use   Vaping status: Never Used  Substance and Sexual Activity   Alcohol use: Yes    Alcohol/week: 14.0 standard drinks of alcohol    Types: 14 Standard drinks or equivalent per week    Comment: wine: 2 glass daily   Drug use: Never   Sexual activity: Never

## 2024-05-10 ENCOUNTER — Encounter: Payer: Self-pay | Admitting: Sports Medicine

## 2024-05-16 ENCOUNTER — Ambulatory Visit: Payer: Medicare Other | Admitting: Family Medicine

## 2024-05-16 ENCOUNTER — Encounter: Payer: Self-pay | Admitting: Family Medicine

## 2024-05-16 VITALS — BP 128/50 | HR 64 | Temp 98.0°F | Ht 68.0 in | Wt 171.2 lb

## 2024-05-16 DIAGNOSIS — I2510 Atherosclerotic heart disease of native coronary artery without angina pectoris: Secondary | ICD-10-CM

## 2024-05-16 DIAGNOSIS — F32A Depression, unspecified: Secondary | ICD-10-CM | POA: Diagnosis not present

## 2024-05-16 DIAGNOSIS — I10 Essential (primary) hypertension: Secondary | ICD-10-CM

## 2024-05-16 DIAGNOSIS — Z800 Family history of malignant neoplasm of digestive organs: Secondary | ICD-10-CM | POA: Diagnosis not present

## 2024-05-16 DIAGNOSIS — E785 Hyperlipidemia, unspecified: Secondary | ICD-10-CM | POA: Diagnosis not present

## 2024-05-16 DIAGNOSIS — L309 Dermatitis, unspecified: Secondary | ICD-10-CM | POA: Diagnosis not present

## 2024-05-16 DIAGNOSIS — Z0000 Encounter for general adult medical examination without abnormal findings: Secondary | ICD-10-CM | POA: Diagnosis not present

## 2024-05-16 LAB — COMPREHENSIVE METABOLIC PANEL WITH GFR
ALT: 19 U/L (ref 0–53)
AST: 27 U/L (ref 0–37)
Albumin: 4.2 g/dL (ref 3.5–5.2)
Alkaline Phosphatase: 70 U/L (ref 39–117)
BUN: 18 mg/dL (ref 6–23)
CO2: 29 meq/L (ref 19–32)
Calcium: 9.1 mg/dL (ref 8.4–10.5)
Chloride: 106 meq/L (ref 96–112)
Creatinine, Ser: 0.87 mg/dL (ref 0.40–1.50)
GFR: 80.97 mL/min (ref >60.00)
Glucose, Bld: 78 mg/dL (ref 70–99)
Potassium: 4.7 meq/L (ref 3.5–5.1)
Sodium: 141 meq/L (ref 135–145)
Total Bilirubin: 0.7 mg/dL (ref 0.2–1.2)
Total Protein: 6.4 g/dL (ref 6.0–8.3)

## 2024-05-16 LAB — CBC
HCT: 42.5 % (ref 39.0–52.0)
Hemoglobin: 14.1 g/dL (ref 13.0–17.0)
MCHC: 33.3 g/dL (ref 30.0–36.0)
MCV: 97.6 fl (ref 78.0–100.0)
Platelets: 159 K/uL (ref 150.0–400.0)
RBC: 4.36 Mil/uL (ref 4.22–5.81)
RDW: 13.3 % (ref 11.5–15.5)
WBC: 3.5 K/uL — ABNORMAL LOW (ref 4.0–10.5)

## 2024-05-16 LAB — LIPASE: Lipase: 18 U/L (ref 11.0–59.0)

## 2024-05-16 LAB — LIPID PANEL
Cholesterol: 126 mg/dL (ref 0–200)
HDL: 66.2 mg/dL
LDL Cholesterol: 49 mg/dL (ref 0–99)
NonHDL: 59.56
Total CHOL/HDL Ratio: 2
Triglycerides: 55 mg/dL (ref 0.0–149.0)
VLDL: 11 mg/dL (ref 0.0–40.0)

## 2024-05-16 MED ORDER — FLUOXETINE HCL 20 MG PO CAPS: 20.0000 mg | ORAL_CAPSULE | Freq: Every day | ORAL | 1 refills | Status: AC

## 2024-05-16 NOTE — Patient Instructions (Addendum)
 Call [facility] gastroenterology to see if you are due for repeat colonoscopy.  Thank you for coming in today. No change in medications at this time. If there are any concerns on your bloodwork, I will let you know. Take care!  Preventive Care 52 Years and Older, Male Preventive care refers to lifestyle choices and visits with your health care provider that can promote health and wellness. Preventive care visits are also called wellness exams. What can I expect for my preventive care visit? Counseling During your preventive care visit, your health care provider may ask about your: Medical history, including: Past medical problems. Family medical history. History of falls. Current health, including: Emotional well-being. Home life and relationship well-being. Sexual activity. Memory and ability to understand (cognition). Lifestyle, including: Alcohol, nicotine or tobacco, and drug use. Access to firearms. Diet, exercise, and sleep habits. Work and work Astronomer. Sunscreen use. Safety issues such as seatbelt and bike helmet use. Physical exam Your health care provider will check your: Height and weight. These may be used to calculate your BMI (body mass index). BMI is a measurement that tells if you are at a healthy weight. Waist circumference. This measures the distance around your waistline. This measurement also tells if you are at a healthy weight and may help predict your risk of certain diseases, such as type 2 diabetes and high blood pressure. Heart rate and blood pressure. Body temperature. Skin for abnormal spots. What immunizations do I need?  Vaccines are usually given at various ages, according to a schedule. Your health care provider will recommend vaccines for you based on your age, medical history, and lifestyle or other factors, such as travel or where you work. What tests do I need? Screening Your health care provider may recommend screening tests for certain  conditions. This may include: Lipid and cholesterol levels. Diabetes screening. This is done by checking your blood sugar (glucose) after you have not eaten for a while (fasting). Hepatitis C test. Hepatitis B test. HIV (human immunodeficiency virus) test. STI (sexually transmitted infection) testing, if you are at risk. Lung cancer screening. Colorectal cancer screening. Prostate cancer screening. Abdominal aortic aneurysm (AAA) screening. You may need this if you are a current or former smoker. Talk with your health care provider about your test results, treatment options, and if necessary, the need for more tests. Follow these instructions at home: Eating and drinking  Eat a diet that includes fresh fruits and vegetables, whole grains, lean protein, and low-fat dairy products. Limit your intake of foods with high amounts of sugar, saturated fats, and salt. Take vitamin and mineral supplements as recommended by your health care provider. Do not drink alcohol if your health care provider tells you not to drink. If you drink alcohol: Limit how much you have to 0-2 drinks a day. Know how much alcohol is in your drink. In the [place] one drink equals one 12 oz bottle of beer (355 mL), one 5 oz glass of wine (148 mL), or one 1 oz glass of hard liquor (44 mL). Lifestyle Brush your teeth every morning and night with fluoride toothpaste. Floss one time each day. Exercise for at least 30 minutes 5 or more days each week. Do not use any products that contain nicotine or tobacco. These products include cigarettes, chewing tobacco, and vaping devices, such as e-cigarettes. If you need help quitting, ask your health care provider. Do not use drugs. If you are sexually active, practice safe sex. Use a condom or other form  of protection to prevent STIs. Take aspirin  only as told by your health care provider. Make sure that you understand how much to take and what form to take. Work with your health care  provider to find out whether it is safe and beneficial for you to take aspirin  daily. Ask your health care provider if you need to take a cholesterol-lowering medicine (statin). Find healthy ways to manage stress, such as: Meditation, yoga, or listening to music. Journaling. Talking to a trusted person. Spending time with friends and family. Safety Always wear your seat belt while driving or riding in a vehicle. Do not drive: If you have been drinking alcohol. Do not ride with someone who has been drinking. When you are tired or distracted. While texting. If you have been using any mind-altering substances or drugs. Wear a helmet and other protective equipment during sports activities. If you have firearms in your house, make sure you follow all gun safety procedures. Minimize exposure to UV radiation to reduce your risk of skin cancer. What's next? Visit your health care provider once a year for an annual wellness visit. Ask your health care provider how often you should have your eyes and teeth checked. Stay up to date on all vaccines. This information is not intended to replace advice given to you by your health care provider. Make sure you discuss any questions you have with your health care provider. Document Revised: 04/06/2021 Document Reviewed: 04/06/2021 [employer]vier Patient Education  2024 [employer].

## 2024-05-16 NOTE — Progress Notes (Signed)
 Subjective:  Patient ID: PATON CRUM, male    DOB: 1941-09-08  Age: 83 y.o. MRN: [id number]  CC:  Chief Complaint  Patient presents with   Annual Exam    Physical; no concerns    HPI Timothy Escobar presents for Annual Exam  PCP, me Cardiology, Dr. Burnard, history of STEMI in 2017.  PCI to the LAD, PTCA-DES for 100% occlusion, prior ST elevation resolved, EKG normalized and subsequent echo with normal LV function without any wall motion abnormality.  Asymptomatic.  Echo EF 65 to 70% in April of this year.  No wall motion abnormalities.  Aortic sclerosis without stenosis.  Mild MVR, mild LA dilation.  Will be transitioning to Dr. Wonda in Dr. Joesphine retirement. No CP/DOE. Staying active.   ENT, Dr. Karis for vocal cords, no recent issues. Normal voice, no cough.   Orthopedic sports medicine, Dr. Burnetta - appointment July 18, confirmed infrapatellar bursitis and patellofemoral arthralgia.  Continued Voltaren  gel as needed, neoprene compression sleeve if needed and to remain active with RTC precautions given.  Podiatry, Dr. Magdalen, prior capsulitis of R ankle and peroneal tendinitis, occasional left ankle issues not affecting activity.   Dermatology, Dr. Dietrich, evaluated in October 12, 2023 for irritant contact dermatitis of hands, fingers, uses clobetasol intermittently.   Ophthalmology, Dr. Octaviano Gaudy.  Appointment November 2024. Appt with Glendia Gaudy next time. Yearly visits.   Hyperlipidemia: With history of CAD as above.  Target LDL less than 70.  Atorvastatin  80 mg daily.  Lipoprotein A was normal in May. No myalgias or side effects. No new bleeding with Brilinta .  Lab Results  Component Value Date   CHOL 134 11/14/2023   HDL 64.20 11/14/2023   LDLCALC 48 11/14/2023   TRIG 112.0 11/14/2023   CHOLHDL 2 11/14/2023   Lab Results  Component Value Date   ALT 19 11/14/2023   AST 23 11/14/2023   ALKPHOS 78 11/14/2023   BILITOT 0.6 11/14/2023    Hypertension: Amlodipine  5 mg daily, denies any new side effects.no new swelling or side effects.  No lightheadedness. Home readings 130-135/70.  BP Readings from Last 3 Encounters:  05/16/24 (!) 128/50  04/28/24 122/80  03/31/24 110/64   Lab Results  Component Value Date   CREATININE 0.92 11/14/2023   Depression: Fluoxetine  20 mg daily, breakthrough symptoms noted in the past with trial off SSRI.  Some erectile dysfunction, delaying the ejaculation prior, and had used Viagra  previously.  Symptoms improved with intermittent dosing of SSRI.  Currently taking 4 days per week. Managing symptoms with intermittent dosing and sexual side effects improved with this dosing.      05/16/2024    9:37 AM 02/05/2024    9:35 AM 11/14/2023    2:48 PM 05/07/2023   10:16 AM 01/18/2023    8:17 AM  Depression screen PHQ 2/9  Decreased Interest 0 0 0 0 0  Down, Depressed, Hopeless 0 1 0 0 0  PHQ - 2 Score 0 1 0 0 0  Altered sleeping 0 2 0 0 0  Tired, decreased energy 0 0 0 0 0  Change in appetite 0 0 0 0 0  Feeling bad or failure about yourself  0 0 0 0 0  Trouble concentrating 0 0 0 0 0  Moving slowly or fidgety/restless 0 0 0 0 0  Suicidal thoughts 0 0 0 0 0  PHQ-9 Score 0 3 0 0 0  Difficult doing work/chores Not difficult at all Not difficult at all  Not difficult at all        05/16/2024    9:37 AM 02/05/2024    9:35 AM 11/14/2023    2:48 PM 05/07/2023   10:16 AM 01/18/2023    8:17 AM  Depression screen PHQ 2/9  Decreased Interest 0 0 0 0 0  Down, Depressed, Hopeless 0 1 0 0 0  PHQ - 2 Score 0 1 0 0 0  Altered sleeping 0 2 0 0 0  Tired, decreased energy 0 0 0 0 0  Change in appetite 0 0 0 0 0  Feeling bad or failure about yourself  0 0 0 0 0  Trouble concentrating 0 0 0 0 0  Moving slowly or fidgety/restless 0 0 0 0 0  Suicidal thoughts 0 0 0 0 0  PHQ-9 Score 0 3 0 0 0  Difficult doing work/chores Not difficult at all Not difficult at all   Not difficult at all    Health  Maintenance  Topic Date Due   COVID-19 Vaccine (7 - Pfizer risk 2024-25 season) 01/31/2024   Colonoscopy  07/06/2024   INFLUENZA VACCINE  05/23/2024   Medicare Annual Wellness (AWV)  02/04/2025   Pneumococcal Vaccine: 50+ Years  Completed   Hepatitis B Vaccines  Aged Out   HPV VACCINES  Aged Out   Meningococcal B Vaccine  Aged Out   DTaP/Tdap/Td  Discontinued   Zoster Vaccines- Shingrix  Discontinued  Colonoscopy 2009 with hyperplastic polyp, repeat in 2019. Prior polyps. No call received. ? 5 year repeat.   Brother and sister both had pancreatic cancer No abd pain, n/v/ or weight loss. Discussed option of genetic testing - declined at this time.   Immunization History  Administered Date(s) Administered   Fluad Quad(high Dose 65+) 07/25/2019, 07/21/2020, 08/09/2022   Influenza,inj,Quad PF,6+ Mos 07/18/2013, 07/29/2014, 08/30/2015, 07/20/2016, 08/01/2017, 08/21/2018   Influenza-Unspecified 07/23/2012, 07/23/2021, 07/16/2023   PFIZER(Purple Top)SARS-COV-2 Vaccination 11/12/2019, 11/30/2019, 07/20/2020, 02/02/2021, 07/18/2022   Pfizer(Comirnaty)Fall Seasonal Vaccine 12 years and older 08/02/2023   Pneumococcal Conjugate-13 12/18/2017   Pneumococcal Polysaccharide-23 05/13/2008   Td 05/13/2008   Tdap 09/26/2012   Zoster, Live 10/23/2009  Flu vaccine, COVID booster in the fall recommended. Prevnar discussed - declined.   No results found. Optho as above yearly  Dental: every 6 months.   Alcohol: 14 per week. 2 per day - red wine.  No recent changes.   Tobacco: none.   Exercise: active at home, building a deck currently.    History Patient Active Problem List   Diagnosis Date Noted   Sinus bradycardia 01/23/2017   Dyslipidemia    CAD (coronary artery disease)    Status post coronary artery stent placement    Bradycardia    Hypokalemia    Hyperlipidemia with target LDL less than 70    STEMI (ST elevation myocardial infarction) (HCC) 08/22/2016   Acute ST elevation  myocardial infarction (STEMI) involving left anterior descending (LAD) coronary artery (HCC) 08/22/2016   Hip pain 09/28/2012   Chronic depression 09/28/2012   Past Medical History:  Diagnosis Date   Blood transfusion without reported diagnosis    CAD (coronary artery disease)    a. 08/22/2016 Ant STEMI/PCI: LM nl, LAD 30ost, 100p (3.0x15 Synergy DES & 2.75x24 Synergy DES), LCX large/dominant/nl, RCA nondom, nl;  b. 08/2016 Echo: Ef 60-65%, no rwma.   Cataract    Phreesia 03/29/2020   Depression    Dyslipidemia    Heart murmur    Phreesia 03/29/2020   Myocardial infarction (  HCC)    Phreesia 03/29/2020   Skin cancer    skin cancer   Past Surgical History:  Procedure Laterality Date   APPENDECTOMY     CARDIAC CATHETERIZATION N/A 08/22/2016   Procedure: Left Heart Cath and Coronary Angiography;  Surgeon: Debby DELENA Sor, MD;  Location: [facility];  Service: Cardiovascular;  Laterality: N/A;   CARDIAC CATHETERIZATION N/A 08/22/2016   Procedure: Coronary Stent Intervention;  Surgeon: Debby DELENA Sor, MD;  Location: [facility];  Service: Cardiovascular;  Laterality: N/A;  MID LAD   COLON SURGERY N/A    Phreesia 03/29/2020   HERNIA REPAIR     parotid gland cyst removal Right 2012   vocal fold polyp removed     No Known Allergies Prior to Admission medications   Medication Sig Start Date End Date Taking? Authorizing Provider  amLODipine  (NORVASC ) 5 MG tablet TAKE ONE HALF TABLET BY MOUTH DAILY FOR ONE WEEK, IF SYSTOLIC ABOVE 140 INCREASE TO ONE TABLET 10/15/23   Sor Debby DELENA, MD  aspirin  EC 81 MG EC tablet Take 1 tablet (81 mg total) by mouth daily. 08/26/16   Glennon Lot, MD  atorvastatin  (LIPITOR ) 80 MG tablet TAKE 1 TABLET BY MOUTH IN THE EVENING AT 6 PM 09/17/23   Sor Debby DELENA, MD  calcium -vitamin D  (OSCAL WITH D) 500-200 MG-UNIT per tablet Take 1 tablet by mouth daily. 1,000 of Vit D3    [provider]  cholecalciferol (VITAMIN D ) 1000 units  tablet Take 1,000 Units by mouth daily.    [provider]  clobetasol cream (TEMOVATE) 0.05 % Apply 1 Application topically 2 (two) times daily.    [provider]  FLUoxetine  (PROZAC ) 20 MG capsule Take 1 capsule by mouth once daily 01/11/24   Levora Reyes SAUNDERS, MD  Ginger Root POWD by Does not apply route. Mixes 1/2 teaspoon of powder into hot tea once daily    [provider]  Multiple Vitamins-Minerals (MULTIVITAMIN WITH MINERALS) tablet Take 1 tablet by mouth once a week. Centrum Silver    [provider]  nitroGLYCERIN  (NITROSTAT ) 0.4 MG SL tablet Place 1 tablet (0.4 mg total) under the tongue every 5 (five) minutes x 3 doses as needed for chest pain. 03/03/20   Sor Debby DELENA, MD  ticagrelor  (BRILINTA ) 60 MG TABS tablet Take 1 tablet (60 mg total) by mouth 2 (two) times daily. 05/08/24   Sor Debby DELENA, MD  vitamin C (ASCORBIC ACID) 500 MG tablet Take 500 mg by mouth daily.    [provider]   Social History   Socioeconomic History   Marital status: Married    Spouse name: Not on file   Number of children: 2   Years of education: college   Highest education level: Master's degree (e.g., MA, MS, MEng, MEd, MSW, MBA)  Occupational History   Occupation: Retired - Naval architect  Tobacco Use   Smoking status: Former    Current packs/day: 0.00    Types: Cigarettes    Quit date: 01/22/1967    Years since quitting: 57.3   Smokeless tobacco: Never  Vaping Use   Vaping status: Never Used  Substance and Sexual Activity   Alcohol use: Yes    Alcohol/week: 14.0 standard drinks of alcohol    Types: 14 Standard drinks or equivalent per week    Comment: wine: 2 glass daily   Drug use: Never   Sexual activity: Never  Other Topics Concern   Not on file  Social History Narrative   Lives w/ wife in [place]. Both are singers, they sing with the [employer] in [place] and with [employer], plus the church choir.   Exercise  walk/gardening daily for 1 hr plus   Social Drivers of Health   Financial Resource Strain: Low Risk  (04/24/2024)   Overall Financial Resource Strain (CARDIA)    Difficulty of Paying Living Expenses: Not hard at all  Food Insecurity: No Food Insecurity (04/24/2024)   Hunger Vital Sign    Worried About Running Out of Food in the Last Year: Never true    Ran Out of Food in the Last Year: Never true  Transportation Needs: No Transportation Needs (04/24/2024)   PRAPARE - Administrator, Civil Service (Medical): No    Lack of Transportation (Non-Medical): No  Physical Activity: Sufficiently Active (04/24/2024)   Exercise Vital Sign    Days of Exercise per Week: 7 days    Minutes of Exercise per Session: 30 min  Recent Concern: Physical Activity - Insufficiently Active (02/05/2024)   Exercise Vital Sign    Days of Exercise per Week: 5 days    Minutes of Exercise per Session: 20 min  Stress: No Stress Concern Present (04/24/2024)   [employer] of Occupational Health - Occupational Stress Questionnaire    Feeling of Stress: Not at all  Social Connections: Unknown (04/24/2024)   Social Connection and Isolation Panel    Frequency of Communication with Friends and Family: More than three times a week    Frequency of Social Gatherings with Friends and Family: Once a week    Attends Religious Services: Never    Database administrator or Organizations: Patient declined    Attends Banker Meetings: Not on file    Marital Status: Married  Intimate Partner Violence: Not At Risk (02/05/2024)   Humiliation, Afraid, Rape, and Kick questionnaire    Fear of Current or Ex-Partner: No    Emotionally Abused: No    Physically Abused: No    Sexually Abused: No    Review of Systems  13 point review of systems per patient health survey noted.  Negative other than as indicated above or in HPI.   Objective:   Vitals:   05/16/24 0936  BP: (!) 128/50  Pulse: 64  Temp: 98 F (36.7  C)  SpO2: 98%  Weight: 171 lb 3.2 oz (77.7 kg)  Height: 5' 8 (1.727 m)     Physical Exam Vitals reviewed.  Constitutional:      Appearance: He is well-developed.  HENT:     Head: Normocephalic and atraumatic.     Right Ear: External ear normal.     Left Ear: External ear normal.  Eyes:     Conjunctiva/sclera: Conjunctivae normal.     Pupils: Pupils are equal, round, and reactive to light.  Neck:     Thyroid : No thyromegaly.  Cardiovascular:     Rate and Rhythm: Normal rate and regular rhythm.     Heart sounds: Normal heart sounds.  Pulmonary:     Effort: Pulmonary effort is normal. No respiratory distress.     Breath sounds: Normal breath sounds. No wheezing.  Abdominal:     General: There is no distension.     Palpations: Abdomen is soft.     Tenderness: There is no abdominal tenderness.  Musculoskeletal:        General: No tenderness. Normal range of motion.     Cervical back: Normal  range of motion and neck supple.  Lymphadenopathy:     Cervical: No cervical adenopathy.  Skin:    General: Skin is warm and dry.  Neurological:     Mental Status: He is alert and oriented to person, place, and time.     Deep Tendon Reflexes: Reflexes are normal and symmetric.  Psychiatric:        Behavior: Behavior normal.        Assessment & Plan:  Timothy Escobar is a 83 y.o. male . Annual physical exam  - -anticipatory guidance as below in AVS, screening labs above. Health maintenance items as above in HPI discussed/recommended as applicable.   Essential hypertension  - Stable on current regimen, check labs and adjust plan accordingly.  Hyperlipidemia, unspecified hyperlipidemia type - Plan: Lipid panel, Comprehensive metabolic panel with GFR  - Tolerating current dose Lipitor , continue same, check labs and adjust plan accordingly.  Depression, unspecified depression type - Plan: FLUoxetine  (PROZAC ) 20 MG capsule  -  Stable, tolerating current regimen. Medications  refilled.   Coronary artery disease involving native coronary artery of native heart without angina pectoris - Plan: CBC  - Asymptomatic, continue current med regimen and follow-up with cardiology as planned.  Hand eczema Clobetasol as needed.  Family history of pancreatic cancer - Plan: Lipase Asymptomatic, check lipase, continue to monitor for new symptoms including any abdominal pain, nausea, vomiting or unexplained weight loss.  Did recommend contacting gastroenterology to determine if need or timing for repeat colonoscopy.  Meds ordered this encounter  Medications   FLUoxetine  (PROZAC ) 20 MG capsule    Sig: Take 1 capsule (20 mg total) by mouth daily.    Dispense:  90 capsule    Refill:  1   Patient Instructions  Call [facility] gastroenterology to see if you are due for repeat colonoscopy.  Thank you for coming in today. No change in medications at this time. If there are any concerns on your bloodwork, I will let you know. Take care!  Preventive Care 54 Years and Older, Male Preventive care refers to lifestyle choices and visits with your health care provider that can promote health and wellness. Preventive care visits are also called wellness exams. What can I expect for my preventive care visit? Counseling During your preventive care visit, your health care provider may ask about your: Medical history, including: Past medical problems. Family medical history. History of falls. Current health, including: Emotional well-being. Home life and relationship well-being. Sexual activity. Memory and ability to understand (cognition). Lifestyle, including: Alcohol, nicotine or tobacco, and drug use. Access to firearms. Diet, exercise, and sleep habits. Work and work Astronomer. Sunscreen use. Safety issues such as seatbelt and bike helmet use. Physical exam Your health care provider will check your: Height and weight. These may be used to calculate your BMI (body mass index).  BMI is a measurement that tells if you are at a healthy weight. Waist circumference. This measures the distance around your waistline. This measurement also tells if you are at a healthy weight and may help predict your risk of certain diseases, such as type 2 diabetes and high blood pressure. Heart rate and blood pressure. Body temperature. Skin for abnormal spots. What immunizations do I need?  Vaccines are usually given at various ages, according to a schedule. Your health care provider will recommend vaccines for you based on your age, medical history, and lifestyle or other factors, such as travel or where you work. What tests do I need? Screening  Your health care provider may recommend screening tests for certain conditions. This may include: Lipid and cholesterol levels. Diabetes screening. This is done by checking your blood sugar (glucose) after you have not eaten for a while (fasting). Hepatitis C test. Hepatitis B test. HIV (human immunodeficiency virus) test. STI (sexually transmitted infection) testing, if you are at risk. Lung cancer screening. Colorectal cancer screening. Prostate cancer screening. Abdominal aortic aneurysm (AAA) screening. You may need this if you are a current or former smoker. Talk with your health care provider about your test results, treatment options, and if necessary, the need for more tests. Follow these instructions at home: Eating and drinking  Eat a diet that includes fresh fruits and vegetables, whole grains, lean protein, and low-fat dairy products. Limit your intake of foods with high amounts of sugar, saturated fats, and salt. Take vitamin and mineral supplements as recommended by your health care provider. Do not drink alcohol if your health care provider tells you not to drink. If you drink alcohol: Limit how much you have to 0-2 drinks a day. Know how much alcohol is in your drink. In the [place] one drink equals one 12 oz bottle of beer  (355 mL), one 5 oz glass of wine (148 mL), or one 1 oz glass of hard liquor (44 mL). Lifestyle Brush your teeth every morning and night with fluoride toothpaste. Floss one time each day. Exercise for at least 30 minutes 5 or more days each week. Do not use any products that contain nicotine or tobacco. These products include cigarettes, chewing tobacco, and vaping devices, such as e-cigarettes. If you need help quitting, ask your health care provider. Do not use drugs. If you are sexually active, practice safe sex. Use a condom or other form of protection to prevent STIs. Take aspirin  only as told by your health care provider. Make sure that you understand how much to take and what form to take. Work with your health care provider to find out whether it is safe and beneficial for you to take aspirin  daily. Ask your health care provider if you need to take a cholesterol-lowering medicine (statin). Find healthy ways to manage stress, such as: Meditation, yoga, or listening to music. Journaling. Talking to a trusted person. Spending time with friends and family. Safety Always wear your seat belt while driving or riding in a vehicle. Do not drive: If you have been drinking alcohol. Do not ride with someone who has been drinking. When you are tired or distracted. While texting. If you have been using any mind-altering substances or drugs. Wear a helmet and other protective equipment during sports activities. If you have firearms in your house, make sure you follow all gun safety procedures. Minimize exposure to UV radiation to reduce your risk of skin cancer. What's next? Visit your health care provider once a year for an annual wellness visit. Ask your health care provider how often you should have your eyes and teeth checked. Stay up to date on all vaccines. This information is not intended to replace advice given to you by your health care provider. Make sure you discuss any questions you  have with your health care provider. Document Revised: 04/06/2021 Document Reviewed: 04/06/2021 Elsevier Patient Education  2024 Elsevier Inc.     Signed,   Reyes Pines, MD [facility], [facility] 05/16/24 10:11 AM

## 2024-05-20 ENCOUNTER — Ambulatory Visit: Payer: Self-pay | Admitting: Family Medicine

## 2024-06-12 DIAGNOSIS — Z86018 Personal history of other benign neoplasm: Secondary | ICD-10-CM | POA: Diagnosis not present

## 2024-06-30 ENCOUNTER — Encounter: Payer: Self-pay | Admitting: Student in an Organized Health Care Education/Training Program

## 2024-06-30 ENCOUNTER — Ambulatory Visit: Payer: Self-pay

## 2024-06-30 ENCOUNTER — Ambulatory Visit (INDEPENDENT_AMBULATORY_CARE_PROVIDER_SITE_OTHER): Admitting: Student in an Organized Health Care Education/Training Program

## 2024-06-30 VITALS — BP 133/60 | HR 57 | Wt 172.0 lb

## 2024-06-30 DIAGNOSIS — G479 Sleep disorder, unspecified: Secondary | ICD-10-CM | POA: Insufficient documentation
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
## 2024-06-30 NOTE — Assessment & Plan Note (Signed)
 Excessive daytime sleepiness is attributed to insufficient sleep duration. Blood work recently was normal.  Exam is reassuring today.  I doubt there is any metabolic derangement.  I recommended increase nighttime sleep to 7-8 hours. Reduce or eliminate afternoon naps. High caffeine and alcohol intake are affecting sleep quality, leading to disturbances and daytime sleepiness.  I recommended to reduce caffeine intake, especially in the afternoon and evening. Alcohol's impact on sleep quality was discussed; I recommended to reduce evening alcohol consumption.
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
## 2024-06-30 NOTE — Telephone Encounter (Signed)
 Noted, thanks!

## 2024-07-10 ENCOUNTER — Ambulatory Visit: Admitting: Podiatry

## 2024-07-10 ENCOUNTER — Ambulatory Visit

## 2024-07-10 ENCOUNTER — Ambulatory Visit (INDEPENDENT_AMBULATORY_CARE_PROVIDER_SITE_OTHER)

## 2024-07-10 DIAGNOSIS — M65972 Unspecified synovitis and tenosynovitis, left ankle and foot: Secondary | ICD-10-CM | POA: Diagnosis not present

## 2024-07-10 DIAGNOSIS — M25572 Pain in left ankle and joints of left foot: Secondary | ICD-10-CM

## 2024-07-10 DIAGNOSIS — M79672 Pain in left foot: Secondary | ICD-10-CM

## 2024-07-10 MED ORDER — TRIAMCINOLONE ACETONIDE 10 MG/ML IJ SUSP
10.0000 mg | Freq: Once | INTRAMUSCULAR | Status: AC
  Administered 2024-07-10: 10 mg

## 2024-07-10 NOTE — Progress Notes (Signed)
 Patient presents with pain on the medial aspect and medial arch of the plantar foot left.  Started about 3 months ago with mowing the yard.  Has noticed swelling around the medial aspect of the foot.  1 area particular where it is exquisitely tender where he points over the navicular tuberosity.   Physical exam:  General appearance: Pleasant, and in no acute distress. AOx3.  Vascular: Pedal pulses: DP 2/4 bilaterally, PT 2/4 bilaterally.  Mild to moderate edema lower legs bilaterally. Capillary fill time immediate bilaterally.  Neurological: Light touch intact feet bilaterally.  Normal Achilles reflex bilaterally.  No clonus or spasticity noted.  Tinel sign tarsal tunnel and porta pedis left.  Dermatologic:   Skin normal temperature bilaterally.  Skin normal color, tone, and texture bilaterally.   Musculoskeletal: Tenderness at the navicular tuberosity and distal posterior tibial tendon right left.  Tenderness on the plantar aspect of the navicular tuberosity and along the posterior tibial tendon and the medial plantar foot.  No tenderness with range of motion subtalar joint or ankle joint.  Slight tenderness in retro at the peroneal tendons at the retromalleolar area.  Normal muscle strength lower extremity bilaterally.  Tenderness along posterior tibial tendon with inversion against resistance left  Radiographs: 3 views foot left: Medial accessory navicular.  Possibly some uneven joint space narrowing at the talonavicular joint.  No evidence of any fractures or dislocations.  Normal bone density.  No evidence of bone tumors.  Diagnosis: 1.  Posterior tibial tenosynovitis left 2.  Arthralgia talonavicular joint left.  Plan: -Discussed with him pain in the foot.  Recommended RICE.  Wear good stable shoe.  Discussed the posterior tibial tendon.  With the feet loaded in neutral position the arch is lower on the left foot than on the right but there is no significant difference in muscle  strength left to right.  -injected 3cc 2:1 mixture 0.5 cc Marcaine:Kenolog 10mg /41ml at posterior tibial tendon sheath distally left taking care not to violate the tendon..     Return 2 weeks follow-up injection PTT left

## 2024-07-16 ENCOUNTER — Ambulatory Visit: Admitting: Podiatry

## 2024-07-24 ENCOUNTER — Ambulatory Visit: Admitting: Podiatry

## 2024-08-10 ENCOUNTER — Other Ambulatory Visit: Payer: Self-pay | Admitting: Medical Genetics

## 2024-08-25 ENCOUNTER — Encounter: Payer: Self-pay | Admitting: Radiology

## 2024-09-05 ENCOUNTER — Ambulatory Visit (INDEPENDENT_AMBULATORY_CARE_PROVIDER_SITE_OTHER): Admitting: Family Medicine

## 2024-09-05 ENCOUNTER — Encounter: Payer: Self-pay | Admitting: Family Medicine

## 2024-09-05 VITALS — BP 122/60 | HR 56 | Temp 98.4°F | Ht 68.0 in | Wt 172.6 lb

## 2024-09-05 DIAGNOSIS — D500 Iron deficiency anemia secondary to blood loss (chronic): Secondary | ICD-10-CM | POA: Insufficient documentation

## 2024-09-05 DIAGNOSIS — M7052 Other bursitis of knee, left knee: Secondary | ICD-10-CM

## 2024-09-05 DIAGNOSIS — M25562 Pain in left knee: Secondary | ICD-10-CM | POA: Diagnosis not present

## 2024-09-05 NOTE — Patient Instructions (Signed)
 I do think that some of the pain inside the knee may be from arthritis underneath the kneecap, or less likely a meniscus tear.  Given your overall reassuring exam and only intermittent symptoms I think it would be reasonable to try the Voltaren  gel a few times per day for the next week or 2, then if not improving recommend follow-up with Dr. Burnetta to decide if injection would be indicated prior to advanced imaging such as MRI.  Please let me know if there are further questions or any change in symptoms and take care!

## 2024-09-05 NOTE — Progress Notes (Signed)
 Subjective:  Patient ID: Timothy Escobar, male    DOB: [birth year]  Age: 83 y.o. MRN: [id number]  CC:  Chief Complaint  Patient presents with   Knee Pain    Pt notes intermittent pain in Lt knee when going up and down stairs, has been present for several months     HPI Timothy Escobar presents for   Left knee pain Up and down stairs, past few months.  He has been evaluated for this back in July, and at that time thought to have infrapatellar bursitis, plus or minus component of patellofemoral joint pain.  He was evaluated by Dr. Burnetta at [facility].  July 18.  Based on degree of symptoms that time, option of continued monitoring or aspiration or injection which was deferred given minimal symptoms.  Also had a degree of patellofemoral arthralgia with mild cartilage loss of the patellofemoral joint.  As symptoms were not interfering with activity, symptomatic care discussed with use of Voltaren  gel if needed, neoprene compression sleeve and RTC precautions.  Still has bump in front of knee. No pain at infrapatellar bursa.  More pain behind kneecap. Noticing pain up and down to loft. Notices pain coming down stairs. More sore to decline stairs and up stairs at that time, better today. No locking/giving way, but slight catch at times. No fall. No joint swelling.  Tx: no sleeve, has not used voltaren  gel.      history Patient Active Problem List   Diagnosis Date Noted   Anemia due to chronic blood loss 09/05/2024   Sleep disturbance 06/30/2024   Sinus bradycardia 01/23/2017   Dyslipidemia    CAD (coronary artery disease)    Status post coronary artery stent placement    Bradycardia    Hypokalemia    Hyperlipidemia with target LDL less than 70    STEMI (ST elevation myocardial infarction) (HCC) 08/22/2016   Acute ST elevation myocardial infarction (STEMI) involving left anterior descending (LAD) coronary artery (HCC) 08/22/2016   Hip pain 09/28/2012   Chronic depression  09/28/2012   Past Medical History:  Diagnosis Date   Blood transfusion without reported diagnosis    CAD (coronary artery disease)    a. 08/22/2016 Ant STEMI/PCI: LM nl, LAD 30ost, 100p (3.0x15 Synergy DES & 2.75x24 Synergy DES), LCX large/dominant/nl, RCA nondom, nl;  b. 08/2016 Echo: Ef 60-65%, no rwma.   Cataract    Phreesia 03/29/2020   Depression    Dyslipidemia    Heart murmur    Phreesia 03/29/2020   Myocardial infarction [facility])    Phreesia 03/29/2020   Skin cancer    skin cancer   Past Surgical History:  Procedure Laterality Date   APPENDECTOMY     CARDIAC CATHETERIZATION N/A 08/22/2016   Procedure: Left Heart Cath and Coronary Angiography;  Surgeon: Debby DELENA Sor, MD;  Location: [facility] INVASIVE CV LAB;  Service: Cardiovascular;  Laterality: N/A;   CARDIAC CATHETERIZATION N/A 08/22/2016   Procedure: Coronary Stent Intervention;  Surgeon: Debby DELENA Sor, MD;  Location: [facility] INVASIVE CV LAB;  Service: Cardiovascular;  Laterality: N/A;  MID LAD   COLON SURGERY N/A    Phreesia 03/29/2020   HERNIA REPAIR     parotid gland cyst removal Right 2012   vocal fold polyp removed     No Known Allergies Prior to Admission medications   Medication Sig Start Date End Date Taking? Authorizing Provider  amLODipine  (NORVASC ) 5 MG tablet TAKE ONE HALF TABLET BY MOUTH DAILY FOR ONE WEEK,  IF SYSTOLIC ABOVE 140 INCREASE TO ONE TABLET 10/15/23  Yes Burnard Debby LABOR, MD  aspirin  EC 81 MG EC tablet Take 1 tablet (81 mg total) by mouth daily. 08/26/16  Yes Glennon Lot, MD  atorvastatin  (LIPITOR ) 80 MG tablet TAKE 1 TABLET BY MOUTH IN THE EVENING AT 6 PM 09/17/23  Yes Burnard Debby LABOR, MD  calcium -vitamin D  (OSCAL WITH D) 500-200 MG-UNIT per tablet Take 1 tablet by mouth daily. 1,000 of Vit D3   Yes [provider]  cholecalciferol (VITAMIN D ) 1000 units tablet Take 1,000 Units by mouth daily.   Yes [provider]  clobetasol cream (TEMOVATE) 0.05 % Apply 1 Application topically 2  (two) times daily.   Yes [provider]  FLUoxetine  (PROZAC ) 20 MG capsule Take 1 capsule (20 mg total) by mouth daily. 05/16/24  Yes Levora Reyes SAUNDERS, MD  Ginger Root POWD by Does not apply route. Mixes 1/2 teaspoon of powder into hot tea once daily   Yes [provider]  Multiple Vitamins-Minerals (MULTIVITAMIN WITH MINERALS) tablet Take 1 tablet by mouth once a week. Centrum Silver   Yes [provider]  nitroGLYCERIN  (NITROSTAT ) 0.4 MG SL tablet Place 1 tablet (0.4 mg total) under the tongue every 5 (five) minutes x 3 doses as needed for chest pain. 03/03/20  Yes Burnard Debby LABOR, MD  ticagrelor  (BRILINTA ) 60 MG TABS tablet Take 1 tablet (60 mg total) by mouth 2 (two) times daily. 05/08/24  Yes Burnard Debby LABOR, MD  vitamin C (ASCORBIC ACID) 500 MG tablet Take 500 mg by mouth daily.   Yes [provider]   Social History   Socioeconomic History   Marital status: Married    Spouse name: Not on file   Number of children: 2   Years of education: college   Highest education level: Master's degree (e.g., MA, MS, MEng, MEd, MSW, MBA)  Occupational History   Occupation: Retired - naval architect  Tobacco Use   Smoking status: Former    Current packs/day: 0.00    Types: Cigarettes    Quit date: 01/22/1967    Years since quitting: 57.6   Smokeless tobacco: Never  Vaping Use   Vaping status: Never Used  Substance and Sexual Activity   Alcohol use: Yes    Alcohol/week: 14.0 standard drinks of alcohol    Types: 14 Standard drinks or equivalent per week    Comment: wine: 2 glass daily   Drug use: Never   Sexual activity: Never  Other Topics Concern   Not on file  Social History Narrative   Lives w/ wife in [place]. Both are singers, they sing with the [employer] in [place] and with [employer], plus the church choir.   Exercise walk/gardening daily for 1 hr plus   Social Drivers of Health   Financial Resource Strain: Low Risk  (09/04/2024)    Overall Financial Resource Strain (CARDIA)    Difficulty of Paying Living Expenses: Not hard at all  Food Insecurity: No Food Insecurity (09/04/2024)   Hunger Vital Sign    Worried About Running Out of Food in the Last Year: Never true    Ran Out of Food in the Last Year: Never true  Transportation Needs: No Transportation Needs (09/04/2024)   PRAPARE - Administrator, Civil Service (Medical): No    Lack of Transportation (Non-Medical): No  Physical Activity: Sufficiently Active (09/04/2024)   Exercise Vital Sign    Days of Exercise per Week:  6 days    Minutes of Exercise per Session: 30 min  Stress: No Stress Concern Present (09/04/2024)   [employer] of Occupational Health - Occupational Stress Questionnaire    Feeling of Stress: Only a little  Social Connections: Moderately Integrated (09/04/2024)   Social Connection and Isolation Panel    Frequency of Communication with Friends and Family: More than three times a week    Frequency of Social Gatherings with Friends and Family: Once a week    Attends Religious Services: Never    Database Administrator or Organizations: Yes    Attends Engineer, Structural: More than 4 times per year    Marital Status: Married  Catering Manager Violence: Not At Risk (02/05/2024)   Humiliation, Afraid, Rape, and Kick questionnaire    Fear of Current or Ex-Partner: No    Emotionally Abused: No    Physically Abused: No    Sexually Abused: No    Review of Systems Per HPI  Objective:   Vitals:   09/05/24 0951  BP: 122/60  Pulse: (!) 56  Temp: 98.4 F (36.9 C)  TempSrc: Temporal  SpO2: 96%  Weight: 172 lb 9.6 oz (78.3 kg)  Height: 5' 8 (1.727 m)     Physical Exam Constitutional:      General: He is not in acute distress.    Appearance: Normal appearance. He is well-developed.  HENT:     Head: Normocephalic and atraumatic.  Cardiovascular:     Rate and Rhythm: Normal rate.  Pulmonary:     Effort:  Pulmonary effort is normal.  Musculoskeletal:     Comments: Left knee, full range of motion, skin intact, no effusion or erythema apparent.  No joint line tenderness.  Patella nontender, no discomfort with patellar grind/Clark's compression testing but crepitus appreciated.  Negative McMurray, no pain with varus/valgus testing.  Infrapatellar bursa prominence without tenderness or warmth.  Neurological:     Mental Status: He is alert and oriented to person, place, and time.  Psychiatric:        Mood and Affect: Mood normal.        Assessment & Plan:  KISHAUN EREKSON is a 83 y.o. male . Left knee pain, unspecified chronicity  Infrapatellar bursitis of left knee  Patellofemoral arthralgia of left knee  Infrapatellar bursitis asymptomatic, suspect most of his symptoms are at the patellofemoral joint with intermittent discomfort as above.  No true giving way or locking but does have a catching sensation with walking up or down stairs.  As he is more symptomatic at this time I did recommend at least initially trying the Voltaren  gel for a week or 2 and then if that is not improving follow-up with orthopedic sports medicine, Dr. Burnetta, to decide on ultrasound-guided injection plus or minus advanced imaging plus or minus PT.  He agrees with this plan, all questions answered.  No orders of the defined types were placed in this encounter.  Patient Instructions  I do think that some of the pain inside the knee may be from arthritis underneath the kneecap, or less likely a meniscus tear.  Given your overall reassuring exam and only intermittent symptoms I think it would be reasonable to try the Voltaren  gel a few times per day for the next week or 2, then if not improving recommend follow-up with Dr. Burnetta to decide if injection would be indicated prior to advanced imaging such as MRI.  Please let me know if there are further questions  or any change in symptoms and take care!    Signed,    Reyes Pines, MD [facility], [facility] 09/05/24 10:42 AM

## 2024-09-15 ENCOUNTER — Other Ambulatory Visit: Payer: Self-pay | Admitting: General Practice

## 2024-09-16 ENCOUNTER — Encounter: Payer: Self-pay | Admitting: Cardiovascular Disease

## 2024-09-17 ENCOUNTER — Telehealth: Payer: Self-pay | Admitting: Cardiovascular Disease

## 2024-09-17 MED ORDER — ATORVASTATIN CALCIUM 80 MG PO TABS: ORAL_TABLET | ORAL | 2 refills | Status: AC

## 2024-09-26 ENCOUNTER — Ambulatory Visit: Admitting: Family Medicine

## 2024-10-20 ENCOUNTER — Other Ambulatory Visit: Payer: Self-pay | Admitting: General Practice

## 2024-10-22 MED ORDER — AMLODIPINE BESYLATE 5 MG PO TABS: ORAL_TABLET | ORAL | 0 refills | Status: DC

## 2024-10-24 ENCOUNTER — Other Ambulatory Visit: Payer: Self-pay | Admitting: Cardiovascular Disease

## 2024-10-24 MED ORDER — AMLODIPINE BESYLATE 5 MG PO TABS: ORAL_TABLET | ORAL | 0 refills | Status: DC

## 2024-10-27 ENCOUNTER — Other Ambulatory Visit: Payer: Self-pay | Admitting: General Practice

## 2024-10-28 NOTE — Progress Notes (Unsigned)
 " Cardiology Office Note:    Date:  10/29/2024   ID:  Timothy Escobar 06-Jan-1941, MRN [id number]  PCP:  Levora Reyes SAUNDERS, MD   [facility] Providers Cardiologist:  Ozell Fell, MD     Referring MD: Levora Reyes SAUNDERS, MD   Chief Complaint  Patient presents with   Coronary Artery Disease    History of Present Illness:    Timothy Escobar is a 84 y.o. male presenting for follow-up of CAD. He has been followed by Dr Burnard and this is his first visit with me. The patient initially presented with an anterior STEMI in 2017. He was treated with LAD stenting using 2 overlapping Synergy stents and had nomral LVEF after his MI. Since his initial event, he has been asymptomatic with no recurrent ischemic symptoms or hospitalizations. His CV risk factors have been well-controlled.   The patient is here alone today.  He is doing very well from a cardiac perspective.  He denies chest pain, chest pressure, or shortness of breath.  He reports that his blood pressure is well-controlled.  He complains of easy bruising as he has been maintained on long-term DAPT with aspirin  and ticagrelor .  He denies lightheadedness, heart palpitations, orthopnea, PND, or syncope.  Overall he is doing well and remains very active that his age.   Current Medications: Active Medications[1]   Allergies:   Patient has no known allergies.   ROS:   Please see the history of present illness.    All other systems reviewed and are negative.  EKGs/Labs/Other Studies Reviewed:    The following studies were reviewed today: Cardiac Studies & Procedures   ______________________________________________________________________________________________ CARDIAC CATHETERIZATION  CARDIAC CATHETERIZATION 08/22/2016  Conclusion  Ost LAD lesion, 30 %stenosed.  Prox LAD-1 lesion, 30 %stenosed.  Mid LAD-1 lesion, 70 %stenosed.  A STENT SYNERGY DES 3X16 drug eluting stent was successfully placed, and does not  overlap previously placed stent.  Prox LAD-2 lesion, 100 %stenosed.  Post intervention, there is a 0% residual stenosis.  A STENT SYNERGY DES 2.75X24 drug eluting stent was successfully placed, and overlaps previously placed stent.  Mid LAD-2 lesion, 80 %stenosed.  Post intervention, there is a 0% residual stenosis.  Acute ST segment elevation anterior wall myocardial infarction with 11 mm of ST segment elevation in leads V3 through V6 due to total occlusion of the proximal LAD.  Large dominant normal left circumflex vessel and small nondominant RCA.  Successful PCI to the LAD, treated with PTCA/DES stenting at the site of 100% occlusion with initial insertion of a 3.016 mm Synergy DES stent.  Once  TIMI-3 flow was established it was evident that there was segmental disease beyond the stented segment of 70 and 80% and tandem stenting with a 2.7524 mm Synergy DES stent was done with post stent dilatation from 3.23 mm in the proximal portion of the proximal stent to 3.05 mm at the distal portion of the distal stent.  LVEDP 13 mm Hg.  Door to balloon time: 19 minutes  RECOMMENDATION: The patient will continue with Aggrastat  postprocedure due to initial thrombus which had passed distally to the ostium of the second diagonal vessel.  Patient should continue with dual antiplatelet therapy for minimum of a year.  Anticipate potential excellent recovery of LV function with reperfusion from chest pain onset at approximately one hour.  A 2-D echo Doppler study will be ordered.  Findings Coronary Findings Diagnostic  Dominance: Left  Left Main Vessel was injected. Vessel is  normal in caliber. Vessel is angiographically normal.  Left Anterior Descending The LAD was a large vessel that ultimately was found to wrap around the apex and supply the distal third of the inferior wall.  At the start of the procedure, there was segmental 30% near ostial and proximal stenoses before the diagonal and the  vessel was then completely occluded with TIMI 0 flow.  Left Circumflex Left circumflex vessel is a large dominant vessel that was angiographically normal.  Right Coronary Artery The RCA was a normal small nondominant vessel  Intervention  Prox LAD-2 lesion Angioplasty Lesion crossed with guidewire. Pre-stent angioplasty was performed using a BALLOON TREK RX 2.5X12. Maximum pressure: 12 atm. A STENT SYNERGY DES 3X16 drug eluting stent was successfully placed, and does not overlap previously placed stent. Stent strut is well apposed. Post-stent angioplasty was performed. There is no pre-interventional antegrade distal flow (TIMI 0).  The post-interventional distal flow is normal (TIMI 3). The intervention was successful . No complications occurred at this lesion. There is a 0% residual stenosis post intervention.  Mid LAD-1 lesion Angioplasty (Also treats lesions: Mid LAD-2) Lesion crossed with guidewire. A STENT SYNERGY DES 2.75X24 drug eluting stent was successfully placed, and overlaps previously placed stent. Stent strut is well apposed. Post-stent angioplasty was performed using a BALLOON Downing EMERGE MR 3.25X15. There is no pre-interventional antegrade distal flow (TIMI 0).  The post-interventional distal flow is normal (TIMI 3). The intervention was successful . No complications occurred at this lesion. There is a 0% residual stenosis post intervention.  Mid LAD-2 lesion Angioplasty (Also treats lesions: Mid LAD-1) See details in Mid LAD-1 lesion. There is a 0% residual stenosis post intervention.     ECHOCARDIOGRAM  ECHOCARDIOGRAM COMPLETE 01/28/2024  Narrative ECHOCARDIOGRAM REPORT    Patient Name:   Timothy Escobar [facility] Date of Exam: 01/28/2024 Medical Rec #:  [id number]        Height:       68.0 in Accession #:    [id number]       Weight:       171.0 lb Date of Birth:  Aug 29, 1941        BSA:          1.912 m Patient Age:    83 years         BP:           122/60 mmHg Patient  Gender: M                HR:           54 bpm. Exam Location:  [address]  Procedure: 2D Echo, 3D Echo and Strain Analysis (Both Spectral and Color Flow Doppler were utilized during procedure).  Indications:    I35.0 Nonrheumatic aortic (valve) stenosis; I34.2 Nonrheumatic mitral (valve) stenosis  History:        Patient has prior history of Echocardiogram examinations, most recent 07/24/2022. CAD and Previous Myocardial Infarction, Signs/Symptoms:Murmur; Risk Factors:Dyslipidemia and Former Smoker. Bradycardia. Aortic atherosclerosis. Aortic valve sclerosis.  Sonographer:    Jon Hacker RCS Referring Phys: [phone] THOMAS A KELLY  IMPRESSIONS   1. Left ventricular ejection fraction, by estimation, is 65 to 70%. The left ventricle has normal function. The left ventricle has no regional wall motion abnormalities. Left ventricular diastolic parameters were normal. The average left ventricular global longitudinal strain is -19.8 %. The global longitudinal strain is normal. 2. Right ventricular systolic function is normal. The right ventricular size is normal. 3. Left atrial size  was mildly dilated. 4. Mild mitral valve regurgitation. 5. The aortic valve is tricuspid. Aortic valve regurgitation is not visualized. Aortic valve sclerosis is present, with no evidence of aortic valve stenosis.  FINDINGS Left Ventricle: Left ventricular ejection fraction, by estimation, is 65 to 70%. The left ventricle has normal function. The left ventricle has no regional wall motion abnormalities. The average left ventricular global longitudinal strain is -19.8 %. Strain was performed and the global longitudinal strain is normal. The left ventricular internal cavity size was normal in size. There is no left ventricular hypertrophy. Left ventricular diastolic parameters were normal.  Right Ventricle: The right ventricular size is normal. Right vetricular wall thickness was not assessed. Right ventricular  systolic function is normal.  Left Atrium: Left atrial size was mildly dilated.  Right Atrium: Right atrial size was normal in size.  Pericardium: There is no evidence of pericardial effusion.  Mitral Valve: There is mild thickening of the mitral valve leaflet(s). Mild mitral valve regurgitation.  Tricuspid Valve: The tricuspid valve is normal in structure. Tricuspid valve regurgitation is trivial.  Aortic Valve: The aortic valve is tricuspid. Aortic valve regurgitation is not visualized. Aortic valve sclerosis is present, with no evidence of aortic valve stenosis.  Pulmonic Valve: The pulmonic valve was normal in structure. Pulmonic valve regurgitation is mild.  Aorta: The aortic root and ascending aorta are structurally normal, with no evidence of dilitation.  IAS/Shunts: No atrial level shunt detected by color flow Doppler.   LEFT VENTRICLE PLAX 2D LVIDd:         3.70 cm   Diastology LVIDs:         2.30 cm   LV e' medial:    9.14 cm/s LV PW:         0.80 cm   LV E/e' medial:  8.3 LV IVS:        1.10 cm   LV e' lateral:   9.90 cm/s LVOT diam:     2.00 cm   LV E/e' lateral: 7.6 LV SV:         99 LV SV Index:   52        2D Longitudinal Strain LVOT Area:     3.14 cm  2D Strain GLS (A4C):   -21.3 % 2D Strain GLS (A3C):   -19.7 % 2D Strain GLS (A2C):   -18.4 % 2D Strain GLS Avg:     -19.8 %  3D Volume EF: 3D EF:        67 % LV EDV:       139 ml LV ESV:       46 ml LV SV:        93 ml  RIGHT VENTRICLE RV Basal diam:  3.80 cm RV S prime:     17.40 cm/s TAPSE (M-mode): 3.4 cm RVSP:           37.1 mmHg  LEFT ATRIUM             Index        RIGHT ATRIUM           Index LA diam:        4.30 cm 2.25 cm/m   RA Pressure: 3.00 mmHg LA Vol (A2C):   83.8 ml 43.83 ml/m  RA Area:     23.10 cm LA Vol (A4C):   71.0 ml 37.13 ml/m  RA Volume:   70.20 ml  36.71 ml/m LA Biplane Vol: 78.5 ml 41.05 ml/m AORTIC VALVE LVOT  Vmax:   124.00 cm/s LVOT Vmean:  84.400 cm/s LVOT VTI:     0.314 m  AORTA Ao Root diam: 3.50 cm Ao Asc diam:  3.80 cm  MITRAL VALVE               TRICUSPID VALVE MV Area (PHT): 3.03 cm    TR Peak grad:   34.1 mmHg MV Decel Time: 250 msec    TR Vmax:        292.00 cm/s MV E velocity: 75.50 cm/s  Estimated RAP:  3.00 mmHg MV A velocity: 80.20 cm/s  RVSP:           37.1 mmHg MV E/A ratio:  0.94 SHUNTS Systemic VTI:  0.31 m Systemic Diam: 2.00 cm  Vina Gull MD Electronically signed by Vina Gull MD Signature Date/Time: 01/28/2024/3:32:27 PM    Final          ______________________________________________________________________________________________      EKG:        Recent Labs: 05/16/2024: ALT 19; BUN 18; Creatinine, Ser 0.87; Hemoglobin 14.1; Platelets 159.0; Potassium 4.7; Sodium 141  Recent Lipid Panel    Component Value Date/Time   CHOL 126 05/16/2024 1022   CHOL 117 07/19/2022 0848   TRIG 55.0 05/16/2024 1022   HDL 66.20 05/16/2024 1022   HDL 60 07/19/2022 0848   CHOLHDL 2 05/16/2024 1022   VLDL 11.0 05/16/2024 1022   LDLCALC 49 05/16/2024 1022   LDLCALC 46 07/19/2022 0848     Risk Assessment/Calculations:                Physical Exam:    VS:  BP 130/70   Pulse 67   Ht 5' 8 (1.727 m)   Wt 176 lb (79.8 kg)   SpO2 96%   BMI 26.76 kg/m     Wt Readings from Last 3 Encounters:  10/29/24 176 lb (79.8 kg)  09/05/24 172 lb 9.6 oz (78.3 kg)  06/30/24 172 lb (78 kg)     GEN:  Well nourished, well developed in no acute distress HEENT: Normal NECK: No JVD; No carotid bruits LYMPHATICS: No lymphadenopathy CARDIAC: RRR, 2/6 holosystolic murmur best heard at the apex RESPIRATORY:  Clear to auscultation without rales, wheezing or rhonchi  ABDOMEN: Soft, non-tender, non-distended MUSCULOSKELETAL: Trace bilateral pretibial edema; No deformity  SKIN: Warm and dry NEUROLOGIC:  Alert and oriented x 3 PSYCHIATRIC:  Normal affect   Assessment & Plan Coronary artery disease involving native coronary artery  of native heart with other form of angina pectoris Stable without angina.  Greater than 5 years out from his ACS event.  Discussed balance of bleeding risk and antithrombotic risk with his antiplatelet drugs.  I recommended that he discontinue aspirin  and remain on ticagrelor  60 mg twice daily. Hyperlipidemia with target LDL less than 70 Treated with atorvastatin  80 mg daily.  Tolerating this well.  Recent lipids with an LDL of 49.  ALT is 19. Essential hypertension Blood pressure well-controlled on amlodipine .  Explained that his mild leg edema is likely related to amlodipine . Aortic valve sclerosis Prominent murmur on exam.  However, echo reviewed and shows only aortic sclerosis without significant stenosis.  No significant mitral or tricuspid valve disease. Aortic atherosclerosis Treated with ticagrelor  for antiplatelet therapy and a high intensity statin drug.     Medication Adjustments/Labs and Tests Ordered: Current medicines are reviewed at length with the patient today.  Concerns regarding medicines are outlined above.  Orders Placed This Encounter  Procedures   ECHOCARDIOGRAM  COMPLETE   No orders of the defined types were placed in this encounter.   Patient Instructions  Medication Instructions:  STOP Aspirin    *If you need a refill on your cardiac medications before your next appointment, please call your pharmacy*  Lab Work: None ordered today. If you have labs (blood work) drawn today and your tests are completely normal, you will receive your results only by: MyChart Message (if you have MyChart) OR A paper copy in the mail If you have any lab test that is abnormal or we need to change your treatment, we will call you to review the results.  Testing/Procedures: Your physician has requested that you have an echocardiogram prior to 1 year follow-up with Dr. Wonda. Echocardiography is a painless test that uses sound waves to create images of your heart. It provides your  doctor with information about the size and shape of your heart and how well your hearts chambers and valves are working. This procedure takes approximately one hour. There are no restrictions for this procedure. Please do NOT wear cologne, perfume, aftershave, or lotions (deodorant is allowed). Please arrive 15 minutes prior to your appointment time.  Please note: We ask at that you not bring children with you during ultrasound (echo/ vascular) testing. Due to room size and safety concerns, children are not allowed in the ultrasound rooms during exams. Our front office staff cannot provide observation of children in our lobby area while testing is being conducted. An adult accompanying a patient to their appointment will only be allowed in the ultrasound room at the discretion of the ultrasound technician under special circumstances. We apologize for any inconvenience.   Follow-Up: At [facility], you and your health needs are our priority.  As part of our continuing mission to provide you with exceptional heart care, our providers are all part of one team.  This team includes your primary Cardiologist (physician) and Advanced Practice Providers or APPs (Physician Assistants and Nurse Practitioners) who all work together to provide you with the care you need, when you need it.  Your next appointment:   1 year(s)  Provider:   Ozell Wonda, MD     Signed, Ozell Wonda, MD  10/29/2024 5:48 PM    [facility]     [1]  Current Meds  Medication Sig   aspirin  EC 81 MG EC tablet Take 1 tablet (81 mg total) by mouth daily.   atorvastatin  (LIPITOR ) 80 MG tablet TAKE 1 TABLET BY MOUTH IN THE EVENING AT 6 PM   calcium -vitamin D  (OSCAL WITH D) 500-200 MG-UNIT per tablet Take 1 tablet by mouth daily. 1,000 of Vit D3   cholecalciferol (VITAMIN D ) 1000 units tablet Take 1,000 Units by mouth daily.   clobetasol cream (TEMOVATE) 0.05 % Apply 1 Application topically 2 (two) times  daily.   FLUoxetine  (PROZAC ) 20 MG capsule Take 1 capsule (20 mg total) by mouth daily.   Ginger Root POWD by Does not apply route. Mixes 1/2 teaspoon of powder into hot tea once daily   Multiple Vitamins-Minerals (MULTIVITAMIN WITH MINERALS) tablet Take 1 tablet by mouth once a week. Centrum Silver   nitroGLYCERIN  (NITROSTAT ) 0.4 MG SL tablet Place 1 tablet (0.4 mg total) under the tongue every 5 (five) minutes x 3 doses as needed for chest pain.   ticagrelor  (BRILINTA ) 60 MG TABS tablet Take 1 tablet (60 mg total) by mouth 2 (two) times daily.   vitamin C (ASCORBIC ACID) 500 MG tablet Take 500  mg by mouth daily.   [DISCONTINUED] amLODipine  (NORVASC ) 5 MG tablet TAKE ONE HALF TABLET BY MOUTH DAILY FOR ONE WEEK, IF SYSTOLIC ABOVE 140 INCREASE TO ONE TABLET. Please keep scheduled appointment for future refills. Thank you.   "

## 2024-10-29 ENCOUNTER — Encounter: Payer: Self-pay | Admitting: Cardiovascular Disease

## 2024-10-29 ENCOUNTER — Ambulatory Visit: Attending: Cardiovascular Disease | Admitting: Cardiovascular Disease

## 2024-10-29 VITALS — BP 130/70 | HR 67 | Ht 68.0 in | Wt 176.0 lb

## 2024-10-29 DIAGNOSIS — E785 Hyperlipidemia, unspecified: Secondary | ICD-10-CM | POA: Diagnosis not present

## 2024-10-29 DIAGNOSIS — I10 Essential (primary) hypertension: Secondary | ICD-10-CM | POA: Diagnosis not present

## 2024-10-29 DIAGNOSIS — I700 Atherosclerosis of aorta: Secondary | ICD-10-CM

## 2024-10-29 DIAGNOSIS — I358 Other nonrheumatic aortic valve disorders: Secondary | ICD-10-CM | POA: Diagnosis not present

## 2024-10-29 DIAGNOSIS — I25118 Atherosclerotic heart disease of native coronary artery with other forms of angina pectoris: Secondary | ICD-10-CM | POA: Diagnosis not present

## 2024-10-29 MED ORDER — AMLODIPINE BESYLATE 5 MG PO TABS: ORAL_TABLET | ORAL | 3 refills | Status: AC

## 2024-10-29 NOTE — Assessment & Plan Note (Addendum)
 Treated with atorvastatin  80 mg daily.  Tolerating this well.  Recent lipids with an LDL of 49.  ALT is 19.

## 2024-10-29 NOTE — Patient Instructions (Signed)
 Medication Instructions:  STOP Aspirin    *If you need a refill on your cardiac medications before your next appointment, please call your pharmacy*  Lab Work: None ordered today. If you have labs (blood work) drawn today and your tests are completely normal, you will receive your results only by: MyChart Message (if you have MyChart) OR A paper copy in the mail If you have any lab test that is abnormal or we need to change your treatment, we will call you to review the results.  Testing/Procedures: Your physician has requested that you have an echocardiogram prior to 1 year follow-up with Dr. Wonda. Echocardiography is a painless test that uses sound waves to create images of your heart. It provides your doctor with information about the size and shape of your heart and how well your hearts chambers and valves are working. This procedure takes approximately one hour. There are no restrictions for this procedure. Please do NOT wear cologne, perfume, aftershave, or lotions (deodorant is allowed). Please arrive 15 minutes prior to your appointment time.  Please note: We ask at that you not bring children with you during ultrasound (echo/ vascular) testing. Due to room size and safety concerns, children are not allowed in the ultrasound rooms during exams. Our front office staff cannot provide observation of children in our lobby area while testing is being conducted. An adult accompanying a patient to their appointment will only be allowed in the ultrasound room at the discretion of the ultrasound technician under special circumstances. We apologize for any inconvenience.   Follow-Up: At [facility], you and your health needs are our priority.  As part of our continuing mission to provide you with exceptional heart care, our providers are all part of one team.  This team includes your primary Cardiologist (physician) and Advanced Practice Providers or APPs (Physician Assistants and Nurse  Practitioners) who all work together to provide you with the care you need, when you need it.  Your next appointment:   1 year(s)  Provider:   Ozell Wonda, MD

## 2024-10-29 NOTE — Assessment & Plan Note (Addendum)
 Stable without angina.  Greater than 5 years out from his ACS event.  Discussed balance of bleeding risk and antithrombotic risk with his antiplatelet drugs.  I recommended that he discontinue aspirin  and remain on ticagrelor  60 mg twice daily.

## 2024-10-31 ENCOUNTER — Other Ambulatory Visit (HOSPITAL_COMMUNITY)

## 2024-11-10 ENCOUNTER — Other Ambulatory Visit (HOSPITAL_COMMUNITY)
Admission: RE | Admit: 2024-11-10 | Discharge: 2024-11-10 | Disposition: A | Discharge status: Home / Self Care | Payer: Self-pay | Source: Ambulatory Visit | Attending: Medical Genetics | Admitting: Medical Genetics

## 2024-11-14 ENCOUNTER — Ambulatory Visit: Admitting: Family Medicine

## 2024-11-18 LAB — GENECONNECT MOLECULAR SCREEN: Genetic Analysis Overall Interpretation: NEGATIVE

## 2024-11-28 ENCOUNTER — Ambulatory Visit: Admitting: Cardiovascular Disease

## 2025-02-10 ENCOUNTER — Encounter

## 2025-05-18 ENCOUNTER — Encounter: Admitting: Family Medicine
